# Patient Record
Sex: Female | Born: 1950
Health system: Southern US, Community
[De-identification: ages and names within clinical notes are randomized; demographics above are authoritative.]

## PROBLEM LIST (undated history)

## (undated) DIAGNOSIS — H409 Unspecified glaucoma: Secondary | ICD-10-CM

## (undated) DIAGNOSIS — K219 Gastro-esophageal reflux disease without esophagitis: Secondary | ICD-10-CM

## (undated) DIAGNOSIS — E039 Hypothyroidism, unspecified: Secondary | ICD-10-CM

## (undated) DIAGNOSIS — I10 Essential (primary) hypertension: Secondary | ICD-10-CM

## (undated) DIAGNOSIS — I208 Other forms of angina pectoris: Secondary | ICD-10-CM

## (undated) DIAGNOSIS — R0602 Shortness of breath: Secondary | ICD-10-CM

## (undated) DIAGNOSIS — F419 Anxiety disorder, unspecified: Secondary | ICD-10-CM

## (undated) DIAGNOSIS — R519 Headache, unspecified: Secondary | ICD-10-CM

## (undated) DIAGNOSIS — F329 Major depressive disorder, single episode, unspecified: Secondary | ICD-10-CM

## (undated) DIAGNOSIS — F32A Depression, unspecified: Secondary | ICD-10-CM

## (undated) DIAGNOSIS — D649 Anemia, unspecified: Secondary | ICD-10-CM

## (undated) DIAGNOSIS — I2089 Other forms of angina pectoris: Secondary | ICD-10-CM

## (undated) DIAGNOSIS — M1990 Unspecified osteoarthritis, unspecified site: Secondary | ICD-10-CM

## (undated) DIAGNOSIS — R51 Headache: Secondary | ICD-10-CM

## (undated) DIAGNOSIS — R011 Cardiac murmur, unspecified: Secondary | ICD-10-CM

## (undated) DIAGNOSIS — E785 Hyperlipidemia, unspecified: Secondary | ICD-10-CM

## (undated) HISTORY — PX: CHOLECYSTECTOMY: SHX55

## (undated) HISTORY — PX: ABDOMINAL HYSTERECTOMY: SHX81

## (undated) HISTORY — DX: Essential (primary) hypertension: I10

## (undated) HISTORY — DX: Anemia, unspecified: D64.9

## (undated) HISTORY — DX: Depression, unspecified: F32.A

## (undated) HISTORY — DX: Cardiac murmur, unspecified: R01.1

## (undated) HISTORY — DX: Unspecified glaucoma: H40.9

## (undated) HISTORY — PX: BREAST ENHANCEMENT SURGERY: SHX7

## (undated) HISTORY — DX: Hyperlipidemia, unspecified: E78.5

## (undated) HISTORY — DX: Gastro-esophageal reflux disease without esophagitis: K21.9

## (undated) HISTORY — PX: EYE SURGERY: SHX253

## (undated) HISTORY — PX: COSMETIC SURGERY: SHX468

## (undated) HISTORY — DX: Major depressive disorder, single episode, unspecified: F32.9

## (undated) HISTORY — PX: BREAST SURGERY: SHX581

## (undated) HISTORY — DX: Unspecified osteoarthritis, unspecified site: M19.90

## (undated) HISTORY — DX: Anxiety disorder, unspecified: F41.9

---

## 1998-08-05 ENCOUNTER — Other Ambulatory Visit: Admission: RE | Admit: 1998-08-05 | Discharge: 1998-08-05 | Payer: Self-pay | Admitting: Obstetrics & Gynecology

## 1999-10-01 ENCOUNTER — Other Ambulatory Visit: Admission: RE | Admit: 1999-10-01 | Discharge: 1999-10-01 | Payer: Self-pay | Admitting: Obstetrics & Gynecology

## 1999-12-07 ENCOUNTER — Other Ambulatory Visit: Admission: RE | Admit: 1999-12-07 | Discharge: 1999-12-07 | Payer: Self-pay | Admitting: Obstetrics & Gynecology

## 2000-01-04 ENCOUNTER — Other Ambulatory Visit: Admission: RE | Admit: 2000-01-04 | Discharge: 2000-01-04 | Payer: Self-pay | Admitting: Obstetrics & Gynecology

## 2000-08-11 ENCOUNTER — Other Ambulatory Visit: Admission: RE | Admit: 2000-08-11 | Discharge: 2000-08-11 | Payer: Self-pay | Admitting: Obstetrics & Gynecology

## 2000-11-14 ENCOUNTER — Other Ambulatory Visit: Admission: RE | Admit: 2000-11-14 | Discharge: 2000-11-14 | Payer: Self-pay | Admitting: Obstetrics & Gynecology

## 2001-05-25 ENCOUNTER — Other Ambulatory Visit: Admission: RE | Admit: 2001-05-25 | Discharge: 2001-05-25 | Payer: Self-pay | Admitting: Obstetrics & Gynecology

## 2001-12-12 ENCOUNTER — Other Ambulatory Visit: Admission: RE | Admit: 2001-12-12 | Discharge: 2001-12-12 | Payer: Self-pay | Admitting: Obstetrics & Gynecology

## 2002-06-18 ENCOUNTER — Other Ambulatory Visit: Admission: RE | Admit: 2002-06-18 | Discharge: 2002-06-18 | Payer: Self-pay | Admitting: Obstetrics & Gynecology

## 2003-05-15 ENCOUNTER — Other Ambulatory Visit: Admission: RE | Admit: 2003-05-15 | Discharge: 2003-05-15 | Payer: Self-pay | Admitting: Obstetrics & Gynecology

## 2004-01-14 ENCOUNTER — Other Ambulatory Visit: Admission: RE | Admit: 2004-01-14 | Discharge: 2004-01-14 | Payer: Self-pay | Admitting: Obstetrics & Gynecology

## 2004-06-30 ENCOUNTER — Other Ambulatory Visit: Admission: RE | Admit: 2004-06-30 | Discharge: 2004-06-30 | Payer: Self-pay | Admitting: Obstetrics & Gynecology

## 2005-01-08 ENCOUNTER — Other Ambulatory Visit: Admission: RE | Admit: 2005-01-08 | Discharge: 2005-01-08 | Payer: Self-pay | Admitting: Obstetrics & Gynecology

## 2006-02-01 ENCOUNTER — Ambulatory Visit (HOSPITAL_COMMUNITY): Admission: RE | Admit: 2006-02-01 | Discharge: 2006-02-01 | Payer: Self-pay | Admitting: Obstetrics & Gynecology

## 2011-01-26 HISTORY — PX: DILATION AND CURETTAGE OF UTERUS: SHX78

## 2011-04-27 ENCOUNTER — Telehealth: Payer: Self-pay

## 2011-04-27 NOTE — Telephone Encounter (Signed)
Done and sent.  [place] 

## 2011-04-27 NOTE — Telephone Encounter (Signed)
Patient lost her rx for shingles vaccine that Dr. Cleta Alberts gave her last year. She would like it sent to [facility].

## 2011-04-28 NOTE — Telephone Encounter (Signed)
lmom that rx was sent to pharmacy 

## 2011-07-18 ENCOUNTER — Other Ambulatory Visit: Payer: Self-pay | Admitting: Emergency Medicine

## 2011-07-18 ENCOUNTER — Ambulatory Visit (INDEPENDENT_AMBULATORY_CARE_PROVIDER_SITE_OTHER): Payer: 59 | Admitting: Emergency Medicine

## 2011-07-18 VITALS — BP 131/76 | HR 61 | Temp 98.0°F | Resp 16 | Ht 62.5 in | Wt 119.0 lb

## 2011-07-18 DIAGNOSIS — I10 Essential (primary) hypertension: Secondary | ICD-10-CM

## 2011-07-18 DIAGNOSIS — E789 Disorder of lipoprotein metabolism, unspecified: Secondary | ICD-10-CM

## 2011-07-18 DIAGNOSIS — Z139 Encounter for screening, unspecified: Secondary | ICD-10-CM

## 2011-07-18 DIAGNOSIS — E039 Hypothyroidism, unspecified: Secondary | ICD-10-CM

## 2011-07-18 LAB — LIPID PANEL
HDL: 75 mg/dL (ref >39)
LDL Cholesterol: 154 mg/dL — ABNORMAL HIGH (ref 0–99)
Total CHOL/HDL Ratio: 3.4 Ratio

## 2011-07-18 LAB — CBC WITH DIFFERENTIAL/PLATELET
Basophils Absolute: 0 10*3/uL (ref 0.0–0.1)
Eosinophils Relative: 2 % (ref 0–5)
Lymphocytes Relative: 29 % (ref 12–46)
MCV: 91.3 fL (ref 78.0–100.0)
Neutro Abs: 3.4 10*3/uL (ref 1.7–7.7)
Neutrophils Relative %: 58 % (ref 43–77)
Platelets: 334 10*3/uL (ref 150–400)
RDW: 14.1 % (ref 11.5–15.5)
WBC: 5.9 10*3/uL (ref 4.0–10.5)

## 2011-07-18 LAB — COMPREHENSIVE METABOLIC PANEL
ALT: 13 U/L (ref 0–35)
AST: 15 U/L (ref 0–37)
Calcium: 10.1 mg/dL (ref 8.4–10.5)
Chloride: 106 mEq/L (ref 96–112)
Creat: 0.76 mg/dL (ref 0.50–1.10)
Sodium: 141 mEq/L (ref 135–145)

## 2011-07-18 NOTE — Progress Notes (Signed)
  Subjective:    Patient ID: Kristy Arias, female    DOB: [birth year], 61 y.o.   MRN: [id number]  HPI brief discussion with patient regarding upcoming physical exam.    Review of Systems     Objective:   Physical Exam and physical exam for        Assessment & Plan:  Discussed what we will do an upcoming physical exam. Labs were done that we will use for her physical

## 2011-07-19 LAB — VITAMIN D 25 HYDROXY (VIT D DEFICIENCY, FRACTURES): Vit D, 25-Hydroxy: 33 ng/mL (ref 30–89)

## 2011-08-18 ENCOUNTER — Encounter: Payer: Self-pay | Admitting: Emergency Medicine

## 2011-09-11 ENCOUNTER — Other Ambulatory Visit (INDEPENDENT_AMBULATORY_CARE_PROVIDER_SITE_OTHER): Payer: 59

## 2011-09-11 VITALS — BP 112/80 | HR 65 | Temp 97.5°F | Resp 16 | Ht 62.5 in | Wt 122.0 lb

## 2011-09-11 DIAGNOSIS — Z79899 Other long term (current) drug therapy: Secondary | ICD-10-CM

## 2011-09-11 DIAGNOSIS — E039 Hypothyroidism, unspecified: Secondary | ICD-10-CM

## 2011-09-11 DIAGNOSIS — E785 Hyperlipidemia, unspecified: Secondary | ICD-10-CM

## 2011-09-11 LAB — COMPREHENSIVE METABOLIC PANEL
ALT: 14 U/L (ref 0–35)
AST: 17 U/L (ref 0–37)
Calcium: 9.5 mg/dL (ref 8.4–10.5)
Chloride: 104 mEq/L (ref 96–112)
Creat: 1.01 mg/dL (ref 0.50–1.10)

## 2011-09-11 LAB — POCT CBC
Granulocyte percent: 55 %G (ref 37–80)
MCH, POC: 29.8 pg (ref 27–31.2)
MID (cbc): 0.5 (ref 0–0.9)
MPV: 8.9 fL (ref 0–99.8)
POC Granulocyte: 3.7 (ref 2–6.9)
POC MID %: 6.8 %M (ref 0–12)
Platelet Count, POC: 357 10*3/uL (ref 142–424)
RBC: 4.63 M/uL (ref 4.04–5.48)
WBC: 6.7 10*3/uL (ref 4.6–10.2)

## 2011-09-11 LAB — LIPID PANEL
HDL: 53 mg/dL (ref >39)
LDL Cholesterol: 111 mg/dL — ABNORMAL HIGH (ref 0–99)
Total CHOL/HDL Ratio: 3.8 Ratio

## 2011-09-11 LAB — T4, FREE: Free T4: 1.36 ng/dL (ref 0.80–1.80)

## 2011-09-12 ENCOUNTER — Encounter: Payer: Self-pay | Admitting: *Deleted

## 2011-09-21 ENCOUNTER — Other Ambulatory Visit: Payer: Self-pay

## 2011-09-21 MED ORDER — LEVOTHYROXINE SODIUM 50 MCG PO TABS: 50.0000 ug | ORAL_TABLET | Freq: Every day | ORAL | Status: DC

## 2011-11-02 ENCOUNTER — Encounter: Payer: Self-pay | Admitting: Emergency Medicine

## 2011-11-02 ENCOUNTER — Telehealth: Payer: Self-pay

## 2011-11-02 ENCOUNTER — Ambulatory Visit: Payer: 59

## 2011-11-02 ENCOUNTER — Ambulatory Visit: Payer: 59 | Admitting: Emergency Medicine

## 2011-11-02 VITALS — BP 128/78 | HR 64 | Temp 97.9°F | Resp 18 | Ht 62.5 in | Wt 122.0 lb

## 2011-11-02 DIAGNOSIS — I773 Arterial fibromuscular dysplasia: Secondary | ICD-10-CM | POA: Insufficient documentation

## 2011-11-02 DIAGNOSIS — F329 Major depressive disorder, single episode, unspecified: Secondary | ICD-10-CM | POA: Insufficient documentation

## 2011-11-02 DIAGNOSIS — J309 Allergic rhinitis, unspecified: Secondary | ICD-10-CM

## 2011-11-02 DIAGNOSIS — E785 Hyperlipidemia, unspecified: Secondary | ICD-10-CM | POA: Insufficient documentation

## 2011-11-02 DIAGNOSIS — M1990 Unspecified osteoarthritis, unspecified site: Secondary | ICD-10-CM

## 2011-11-02 DIAGNOSIS — I10 Essential (primary) hypertension: Secondary | ICD-10-CM | POA: Insufficient documentation

## 2011-11-02 DIAGNOSIS — F32A Depression, unspecified: Secondary | ICD-10-CM | POA: Insufficient documentation

## 2011-11-02 DIAGNOSIS — Z0000 Encounter for general adult medical examination without abnormal findings: Secondary | ICD-10-CM

## 2011-11-02 LAB — POCT URINALYSIS DIPSTICK
Glucose, UA: NEGATIVE
Nitrite, UA: NEGATIVE
Urobilinogen, UA: 0.2

## 2011-11-02 LAB — POCT UA - MICROSCOPIC ONLY
Bacteria, U Microscopic: NEGATIVE
Casts, Ur, LPF, POC: NEGATIVE
Yeast, UA: NEGATIVE

## 2011-11-02 MED ORDER — BUTALBITAL-ASA-CAFF-CODEINE 50-325-40-30 MG PO CAPS: 1.0000 | ORAL_CAPSULE | ORAL | Status: DC | PRN

## 2011-11-02 MED ORDER — LEVOTHYROXINE SODIUM 50 MCG PO TABS: 50.0000 ug | ORAL_TABLET | Freq: Every day | ORAL | Status: DC

## 2011-11-02 MED ORDER — DULOXETINE HCL 20 MG PO CPEP: 20.0000 mg | ORAL_CAPSULE | Freq: Every day | ORAL | Status: DC

## 2011-11-02 MED ORDER — ALPRAZOLAM 0.5 MG PO TABS: 0.5000 mg | ORAL_TABLET | Freq: Every evening | ORAL | Status: DC | PRN

## 2011-11-02 MED ORDER — NORTRIPTYLINE HCL 25 MG PO CAPS: 25.0000 mg | ORAL_CAPSULE | Freq: Every day | ORAL | Status: DC

## 2011-11-02 MED ORDER — ROSUVASTATIN CALCIUM 5 MG PO TABS: 5.0000 mg | ORAL_TABLET | Freq: Every day | ORAL | Status: DC

## 2011-11-02 MED ORDER — DICLOFENAC SODIUM 75 MG PO TBEC: 75.0000 mg | DELAYED_RELEASE_TABLET | Freq: Two times a day (BID) | ORAL | Status: DC

## 2011-11-02 MED ORDER — LISINOPRIL 10 MG PO TABS: 10.0000 mg | ORAL_TABLET | Freq: Every day | ORAL | Status: DC

## 2011-11-02 MED ORDER — MONTELUKAST SODIUM 10 MG PO TABS: 10.0000 mg | ORAL_TABLET | Freq: Every day | ORAL | Status: DC

## 2011-11-02 NOTE — Progress Notes (Signed)
  Subjective:    Patient ID: Kristy Arias, female    DOB: 1950/07/22, 61 y.o.   MRN: [id number]  HPI patient here for her yearly physical.    Review of Systems  HENT: Positive for sneezing, postnasal drip and sinus pressure.   Eyes: Positive for redness and itching.  Cardiovascular: Positive for leg swelling.  Genitourinary: Positive for urgency and frequency.  Musculoskeletal: Positive for back pain.  Neurological: Positive for headaches.  Psychiatric/Behavioral: Positive for decreased concentration.       Objective:   Physical Exam HEENT exam reveals a minimal amount of scaling at the entrance to both external auditory canals. There are loud carotid bruits bilaterally. The chest is clear to auscultation and cardiac exam reveals a regular rate without murmurs rubs or gallops the abdomen is flat liver and spleen are not enlarged. Examination of the breast revealed no masses there are bilateral implants present. Extremity exam reveals pulses 2+ and symmetrical without edema. There is mild discomfort over left lateral malleolus. Examination of the knees reveals inability to get full extension to either knee there is pain with full flexion and extension. Results for orders placed in visit on 11/02/11  POCT URINALYSIS DIPSTICK      Component Value Range   Color, UA yellow     Clarity, UA clear     Glucose, UA neg     Bilirubin, UA neg     Ketones, UA neg     Spec Grav, UA 1.015     Blood, UA large     pH, UA 5.5     Protein, UA neg     Urobilinogen, UA 0.2     Nitrite, UA neg     Leukocytes, UA Negative    [facility] reading (PRIMARY) by  Dr.Umeka Wrench 3 views of the ankle are normal. The views of both knees show mild narrowing of the joint space but otherwise unremarkable.        Assessment & Plan:  Patient is depressed. She has taken Wellbutrin in the past and Prozac in the past we'll switch to Cymbalta and see how she does. She is a significant amount of problems with arthritis which she  treats with nonsteroidals. Other option is to  consider a different type of exercise such as exercise bike or swimming. I told her to try and take a break from her nonsteroidal at times. She is going to try Singulair and see if this helps before going back to see the allergist. She was agreeable today to see a therapist.

## 2011-11-02 NOTE — Telephone Encounter (Signed)
PT STATES SHE HAVE AN APPT THIS AFTERNOON WITH DR DAUB AND WANT TO MAKE SURE ALL HER LABS AND THE DOPPLER SHE HAD DONE WERE BACK PLEASE CALL [phone]

## 2011-11-02 NOTE — Telephone Encounter (Signed)
Spoke to her, advised labs available but will have to call and get the doppler, called [facility], they are sending to [place]

## 2011-12-29 ENCOUNTER — Telehealth: Payer: Self-pay

## 2011-12-29 NOTE — Telephone Encounter (Signed)
Last office notes, labs, radiology reports, and EKG sent thru Epic. Stress test faxed with confirmation.

## 2011-12-29 NOTE — Telephone Encounter (Signed)
[facility] STATES THEY NEED RECENT RECORDS,EKG,LABS,XRAYS DONE ON PT. AND ALSO PT HAD A STRESS TEST IN 09. WOULD LIKE TO HAVE THAT ALSO. PLEASE FAX TO [phone] AND THE PHONE NUMBER IS [phone]  PT IS WAITING

## 2011-12-30 ENCOUNTER — Other Ambulatory Visit: Payer: Self-pay | Admitting: Obstetrics & Gynecology

## 2012-01-26 ENCOUNTER — Other Ambulatory Visit: Payer: Self-pay | Admitting: Radiology

## 2012-01-26 MED ORDER — LISINOPRIL 10 MG PO TABS: 10.0000 mg | ORAL_TABLET | Freq: Every day | ORAL | Status: DC

## 2012-01-26 NOTE — Telephone Encounter (Signed)
Patient can get Lisinopril for $10, if she gets 100 quantity, Lanora Manis has authorized this, have sent to pharmacy.

## 2012-03-23 ENCOUNTER — Ambulatory Visit: Payer: 59

## 2012-03-23 ENCOUNTER — Ambulatory Visit (INDEPENDENT_AMBULATORY_CARE_PROVIDER_SITE_OTHER): Payer: 59 | Admitting: Emergency Medicine

## 2012-03-23 VITALS — BP 110/74 | HR 73 | Temp 97.6°F | Resp 16 | Ht 62.2 in | Wt 122.0 lb

## 2012-03-23 DIAGNOSIS — R198 Other specified symptoms and signs involving the digestive system and abdomen: Secondary | ICD-10-CM

## 2012-03-23 DIAGNOSIS — R109 Unspecified abdominal pain: Secondary | ICD-10-CM

## 2012-03-23 DIAGNOSIS — K8020 Calculus of gallbladder without cholecystitis without obstruction: Secondary | ICD-10-CM

## 2012-03-23 DIAGNOSIS — R197 Diarrhea, unspecified: Secondary | ICD-10-CM

## 2012-03-23 LAB — COMPREHENSIVE METABOLIC PANEL
Alkaline Phosphatase: 59 U/L (ref 39–117)
Glucose, Bld: 94 mg/dL (ref 70–99)
Sodium: 138 mEq/L (ref 135–145)
Total Bilirubin: 0.4 mg/dL (ref 0.3–1.2)
Total Protein: 6.3 g/dL (ref 6.0–8.3)

## 2012-03-23 LAB — POCT CBC
Granulocyte percent: 62.6 %G (ref 37–80)
HCT, POC: 37.9 % (ref 37.7–47.9)
Lymph, poc: 2.6 (ref 0.6–3.4)
MCH, POC: 29.4 pg (ref 27–31.2)
MCHC: 31.4 g/dL — AB (ref 31.8–35.4)
MCV: 93.5 fL (ref 80–97)
MID (cbc): 0.8 (ref 0–0.9)
POC LYMPH PERCENT: 28.4 %L (ref 10–50)
Platelet Count, POC: 333 10*3/uL (ref 142–424)
RDW, POC: 14.6 %

## 2012-03-23 MED ORDER — DICYCLOMINE HCL 20 MG PO TABS: ORAL_TABLET | ORAL | Status: DC

## 2012-03-23 NOTE — Patient Instructions (Addendum)
Lactaid- OTC

## 2012-03-23 NOTE — Progress Notes (Signed)
  Subjective:    Patient ID: [name], female    DOB: 1950/02/08, 62 y.o.   MRN: [id number]  HPI Pt presents to clinic today with complaints of diarrhea for at least about 2 months sporadically. She states she has had about 3 episodes in January 2014 and February 2014 that were "explosive"- she doesn't make it to the bathroom. This always happens at night- there is no cramping just a lot of gurgling before the episode. Pt uses Lomotil if she has 2-3 episodes of diarrhea in one night. She states that popcorn really bothers her stomach- she's noticed that nuts can give her a problem to.  Last summer Pt states she tried an herbal/vitamin treatment for constipation which seemed to help. She also tried a probiotic last summer. She stopped these remedies in the fall b/c she began taking Airborne and she did not want to mix all these drugs   Review of Systems     Objective:   Physical Exam H. EENT exam is unremarkable. Neck supple. Chest clear to auscultation and percussion. Heart regular rate no murmurs. Abdomen soft liver and spleen not large there no areas of tenderness.  Results for orders placed in visit on 03/23/12  POCT CBC      Result Value Range   WBC 9.3  4.6 - 10.2 K/uL   Lymph, poc 2.6  0.6 - 3.4   POC LYMPH PERCENT 28.4  10 - 50 %L   MID (cbc) 0.8  0 - 0.9   POC MID % 9.0  0 - 12 %M   POC Granulocyte 5.8  2 - 6.9   Granulocyte percent 62.6  37 - 80 %G   RBC 4.05  4.04 - 5.48 M/uL   Hemoglobin 11.9 (*) 12.2 - 16.2 g/dL   HCT, POC 56.2  13.0 - 47.9 %   MCV 93.5  80 - 97 fL   MCH, POC 29.4  27 - 31.2 pg   MCHC 31.4 (*) 31.8 - 35.4 g/dL   RDW, POC 86.5     Platelet Count, POC 333  142 - 424 K/uL   MPV 8.4  0 - 99.8 fL   [facility] reading (PRIMARY) by  Dr Cleta Alberts shows a half by half centimeter calcific density in the left upper abdomen. I suspect this is in the kidney.      assessment and plan   We'll collect stool for ova and parasite, Giardia, stool culture, and C. Difficile.  She's given a trial of Bentyl 20 mg 3 times a day. She did have a calcific density present on her abdominal series so we'll go ahead and do an ultrasound to look at her liver, gallbladder, and left kidney.

## 2012-03-24 LAB — GLIA (IGA/G) + TTG IGA: Tissue Transglutaminase Ab, IgA: 3.8 U/mL (ref <20)

## 2012-03-28 LAB — GIARDIA/CRYPTOSPORIDIUM (EIA)
Cryptosporidium Screen (EIA): NEGATIVE
Giardia Screen (EIA): NEGATIVE

## 2012-03-29 ENCOUNTER — Ambulatory Visit
Admission: RE | Admit: 2012-03-29 | Discharge: 2012-03-29 | Disposition: A | Discharge status: Home / Self Care | Payer: 59 | Source: Ambulatory Visit | Attending: Emergency Medicine | Admitting: Emergency Medicine

## 2012-03-29 DIAGNOSIS — R109 Unspecified abdominal pain: Secondary | ICD-10-CM

## 2012-03-29 LAB — CLOSTRIDIUM DIFFICILE EIA: CDIFTX: NEGATIVE

## 2012-03-31 LAB — STOOL CULTURE

## 2012-04-03 NOTE — Addendum Note (Signed)
Addended by: Cydney Ok on: 04/03/2012 10:12 AM   Modules accepted: Orders

## 2012-04-05 ENCOUNTER — Telehealth: Payer: Self-pay

## 2012-04-05 ENCOUNTER — Emergency Department (HOSPITAL_COMMUNITY)
Admission: EM | Admit: 2012-04-05 | Discharge: 2012-04-05 | Disposition: A | Discharge status: Home / Self Care | Payer: 59 | Attending: Emergency Medicine | Admitting: Emergency Medicine

## 2012-04-05 ENCOUNTER — Ambulatory Visit (INDEPENDENT_AMBULATORY_CARE_PROVIDER_SITE_OTHER): Payer: 59 | Admitting: Emergency Medicine

## 2012-04-05 ENCOUNTER — Encounter (HOSPITAL_COMMUNITY): Payer: Self-pay | Admitting: Unknown Physician Specialty

## 2012-04-05 ENCOUNTER — Emergency Department (HOSPITAL_COMMUNITY): Payer: 59

## 2012-04-05 VITALS — BP 140/80 | HR 76 | Temp 98.0°F | Resp 16 | Ht 62.0 in | Wt 119.4 lb

## 2012-04-05 DIAGNOSIS — Z8669 Personal history of other diseases of the nervous system and sense organs: Secondary | ICD-10-CM | POA: Insufficient documentation

## 2012-04-05 DIAGNOSIS — R079 Chest pain, unspecified: Secondary | ICD-10-CM

## 2012-04-05 DIAGNOSIS — F3289 Other specified depressive episodes: Secondary | ICD-10-CM | POA: Insufficient documentation

## 2012-04-05 DIAGNOSIS — I10 Essential (primary) hypertension: Secondary | ICD-10-CM | POA: Insufficient documentation

## 2012-04-05 DIAGNOSIS — R0789 Other chest pain: Secondary | ICD-10-CM

## 2012-04-05 DIAGNOSIS — Z7982 Long term (current) use of aspirin: Secondary | ICD-10-CM | POA: Insufficient documentation

## 2012-04-05 DIAGNOSIS — E785 Hyperlipidemia, unspecified: Secondary | ICD-10-CM | POA: Insufficient documentation

## 2012-04-05 DIAGNOSIS — F329 Major depressive disorder, single episode, unspecified: Secondary | ICD-10-CM | POA: Insufficient documentation

## 2012-04-05 DIAGNOSIS — R9431 Abnormal electrocardiogram [ECG] [EKG]: Secondary | ICD-10-CM

## 2012-04-05 DIAGNOSIS — R011 Cardiac murmur, unspecified: Secondary | ICD-10-CM | POA: Insufficient documentation

## 2012-04-05 DIAGNOSIS — F411 Generalized anxiety disorder: Secondary | ICD-10-CM | POA: Insufficient documentation

## 2012-04-05 DIAGNOSIS — Z79899 Other long term (current) drug therapy: Secondary | ICD-10-CM | POA: Insufficient documentation

## 2012-04-05 LAB — CBC
HCT: 35 % — ABNORMAL LOW (ref 36.0–46.0)
Hemoglobin: 11.6 g/dL — ABNORMAL LOW (ref 12.0–15.0)
MCH: 30.1 pg (ref 26.0–34.0)
MCV: 90.7 fL (ref 78.0–100.0)
RBC: 3.86 MIL/uL — ABNORMAL LOW (ref 3.87–5.11)

## 2012-04-05 LAB — URINALYSIS, ROUTINE W REFLEX MICROSCOPIC
Bilirubin Urine: NEGATIVE
Glucose, UA: NEGATIVE mg/dL
Ketones, ur: NEGATIVE mg/dL
pH: 6.5 (ref 5.0–8.0)

## 2012-04-05 LAB — URINE MICROSCOPIC-ADD ON

## 2012-04-05 LAB — POCT I-STAT TROPONIN I

## 2012-04-05 LAB — COMPREHENSIVE METABOLIC PANEL
AST: 11 U/L (ref 0–37)
CO2: 27 mEq/L (ref 19–32)
Chloride: 103 mEq/L (ref 96–112)
Creatinine, Ser: 0.92 mg/dL (ref 0.50–1.10)
GFR calc non Af Amer: 67 mL/min — ABNORMAL LOW (ref >90)
Total Bilirubin: 0.2 mg/dL — ABNORMAL LOW (ref 0.3–1.2)

## 2012-04-05 MED ORDER — ASPIRIN 81 MG PO CHEW: 324.0000 mg | CHEWABLE_TABLET | Freq: Once | ORAL | Status: DC

## 2012-04-05 NOTE — ED Notes (Signed)
Pt d/c'd from IV, monitor, continuous pulse oximetry and blood pressure cuff; pt getting dressed at this time to be discharged home; family at bedside

## 2012-04-05 NOTE — ED Notes (Signed)
Pt arrived via [facility] already in gown, pt placed on monitor, continuous pulse oximetry, blood pressure cuff and oxygen [place] (2L); vitals and EKG being performed

## 2012-04-05 NOTE — ED Provider Notes (Signed)
Date: 04/05/2012  Rate: 67  Rhythm: normal sinus rhythm  QRS Axis: normal  Intervals: normal QRS:  Poor R wave progression in precordial leads suggest old anterior myocardial infarction.  ST/T Wave abnormalities: normal  Conduction Disutrbances:none  Narrative Interpretation: Abnormal EKG  Old EKG Reviewed: none available    Carleene Cooper III, MD 04/07/12 1100

## 2012-04-05 NOTE — Telephone Encounter (Signed)
Pt would like to speak with Dr.Daub Regarding her referall to [place] [facility]. Pt states that she called on Monday regarding this but I do not see a phone message on file for this pt.  Best# [phone]

## 2012-04-05 NOTE — ED Provider Notes (Signed)
History     CSN: [id number]  Arrival date & time 04/05/12  1435   First MD Initiated Contact with Patient 04/05/12 1458      Chief Complaint  Patient presents with  . Chest Pain    (Consider location/radiation/quality/duration/timing/severity/associated sxs/prior treatment) HPI  Marguerite ROLENA KNUTSON is a 62 y.o. female c/o substernal chest pain radiating to the right arm. She describes the pain as aching rated at 7/10 at worst; she states that the inner right arm felt funny in the afternoon. Patient was sitting at a meeting yesterday morning with the onset of the pain the pain was constant until yesterday evening. She is pain-free now and the pain was constant until last night. She states that the pain is worse when sitting in alleviated by standing. She took Bentyl with little relief and took Fioricet later in the night with significant relief. She is taking estrogen and replacement therapy she is on her fifth day of a 12 day course.  Patient does endorse a reduced by mouth intake over the last day, she had an episode of nausea in the afternoon yesterday that resolved. She also endorses a shortness of breath that she's had for several years it is nonexertional, but it only occurs when she is climbing steps. Denies SOB, N/V, diaphoresis, cough, fever, back pain, syncope, prior episodes. Denies recent travel, leg swelling, hemoptysis.  Pt had a baby aspirin 11 PM last night.   RF: age over 62, hyperlipidemia and hypertension. Cath:  ?  Last Stress test: several years ago Cardiologost: unknown cardiologist evaluation there was a stress test several years ago which he believes was normal. PCP: [facility] Dr. Cleta Alberts  Past Medical History  Diagnosis Date  . Anxiety   . Depression   . Glaucoma   . Hyperlipidemia   . Heart murmur   . Hypertension     Past Surgical History  Procedure Laterality Date  . Breast enhancement surgery    . Breast surgery    . Cosmetic surgery    . Dilation  and curettage of uterus  2013    Family History  Problem Relation Age of Onset  . Hypertension Father   . Hypertension Maternal Grandmother   . Heart disease Maternal Grandmother   . Diabetes Maternal Grandfather   . Osteoporosis Paternal Grandmother   . Heart disease Paternal Grandfather     History  Substance Use Topics  . Smoking status: Never Smoker   . Smokeless tobacco: Never Used  . Alcohol Use: Not on file    OB History   Grav Para Term Preterm Abortions TAB SAB Ect Mult Living                  Review of Systems  Constitutional: Negative for fever.  Respiratory: Negative for shortness of breath.   Cardiovascular: Positive for chest pain. Negative for palpitations and leg swelling.  Gastrointestinal: Negative for nausea, vomiting, abdominal pain and diarrhea.  All other systems reviewed and are negative.    Allergies  Review of patient's allergies indicates no known allergies.  Home Medications   Current Outpatient Rx  Name  Route  Sig  Dispense  Refill  . ALPRAZolam (XANAX) 0.5 MG tablet   Oral   Take 1 tablet (0.5 mg total) by mouth at bedtime as needed.   30 tablet   5   . aspirin 81 MG tablet   Oral   Take 81 mg by mouth daily.         Marland Kitchen  butalbital-aspirin-caffeine-codeine (FIORINAL WITH CODEINE) 50-325-40-30 MG capsule   Oral   Take 1 capsule by mouth every 4 (four) hours as needed.   30 capsule   5   . calcium carbonate (TUMS - DOSED IN MG ELEMENTAL CALCIUM) 500 MG chewable tablet   Oral   Chew 1 tablet by mouth daily.         . cetirizine (ZYRTEC) 10 MG tablet   Oral   Take 10 mg by mouth daily.         . chlorpheniramine (CHLOR-TRIMETON) 4 MG tablet   Oral   Take 4 mg by mouth 2 (two) times daily as needed for allergies.         . cholecalciferol (VITAMIN D) 1000 UNITS tablet   Oral   Take 2,000 Units by mouth daily.         . diclofenac (VOLTAREN) 75 MG EC tablet   Oral   Take 1 tablet (75 mg total) by mouth 2 (two)  times daily.   60 tablet   5   . dicyclomine (BENTYL) 20 MG tablet      Take 1 tablet 3 times a day   90 tablet   5   . DULoxetine (CYMBALTA) 20 MG capsule   Oral   Take 1 capsule (20 mg total) by mouth daily.   30 capsule   11   . estradiol (ESTRACE) 1 MG tablet   Oral   Take 1 mg by mouth daily.         Marland Kitchen levothyroxine (SYNTHROID, LEVOTHROID) 50 MCG tablet   Oral   Take 1 tablet (50 mcg total) by mouth daily.   30 tablet   5   . lisinopril (PRINIVIL,ZESTRIL) 10 MG tablet   Oral   Take 1 tablet (10 mg total) by mouth daily.   100 tablet   2     Okay for 100 tablets for patient to save on her co ...   . nortriptyline (PAMELOR) 25 MG capsule   Oral   Take 1 capsule (25 mg total) by mouth at bedtime.   30 capsule   11   . progesterone (PROMETRIUM) 100 MG capsule   Oral   Take 200 mg by mouth at bedtime.          . rosuvastatin (CRESTOR) 5 MG tablet   Oral   Take 1 tablet (5 mg total) by mouth daily.   30 tablet   11   . timolol (BETIMOL) 0.25 % ophthalmic solution      1-2 drops 2 (two) times daily.         . Travoprost, BAK Free, (TRAVATAN) 0.004 % SOLN ophthalmic solution      1 drop at bedtime.           BP 170/89  Pulse 70  Temp(Src) 98.7 F (37.1 C) (Oral)  Resp 17  Ht 5' 2.5" (1.588 m)  Wt 120 lb (54.432 kg)  BMI 21.59 kg/m2  SpO2 100%  Physical Exam  Nursing note and vitals reviewed. Constitutional: She is oriented to person, place, and time. She appears well-developed and well-nourished. No distress.  HENT:  Head: Normocephalic and atraumatic.  Mouth/Throat: Oropharynx is clear and moist.  Eyes: Conjunctivae and EOM are normal. Pupils are equal, round, and reactive to light.  Neck: Normal range of motion. No JVD present.  Cardiovascular: Normal rate and intact distal pulses.  Exam reveals gallop.   S4 gallop with midsystolic click  Pulmonary/Chest: Effort normal and  breath sounds normal. No stridor. No respiratory distress.  She has no wheezes. She has no rales. She exhibits no tenderness.  Abdominal: Soft. Bowel sounds are normal. She exhibits no distension and no mass. There is tenderness. There is no rebound and no guarding.  Epigastric tenderness to deep palpation with no rebound or guarding  Musculoskeletal: Normal range of motion. She exhibits no edema.  Homans sign negative bilaterally, there is no calf asymmetry or superficial collaterals, there is no erythema or pitting edema.  Neurological: She is alert and oriented to person, place, and time.  Skin: Skin is warm.  Psychiatric: She has a normal mood and affect.    ED Course  Procedures (including critical care time)  Labs Reviewed  CBC - Abnormal; Notable for the following:    WBC 10.7 (*)    RBC 3.86 (*)    Hemoglobin 11.6 (*)    HCT 35.0 (*)    All other components within normal limits  URINALYSIS, ROUTINE W REFLEX MICROSCOPIC - Abnormal; Notable for the following:    APPearance CLOUDY (*)    Hgb urine dipstick LARGE (*)    Leukocytes, UA TRACE (*)    All other components within normal limits  COMPREHENSIVE METABOLIC PANEL - Abnormal; Notable for the following:    Albumin 3.1 (*)    Total Bilirubin 0.2 (*)    GFR calc non Af Amer 67 (*)    GFR calc Af Amer 77 (*)    All other components within normal limits  URINE MICROSCOPIC-ADD ON - Abnormal; Notable for the following:    Squamous Epithelial / LPF FEW (*)    Casts HYALINE CASTS (*)    All other components within normal limits  POCT I-STAT TROPONIN I   Dg Chest 2 View  04/05/2012  *RADIOLOGY REPORT*  Clinical Data: Chest pain  CHEST - 2 VIEW  Comparison: None.  Findings: The lungs are well-aerated and free from pulmonary edema, focal airspace consolidation or pulmonary nodule.  Cardiac and mediastinal contours are within normal limits.  No pneumothorax, or pleural effusion. No acute osseous findings. bilateral breast augmentation prostheses.  IMPRESSION:  No acute cardiopulmonary  disease.   Original Report Authenticated By: Malachy Moan, M.D.       Date: 04/05/2012  Rate: 67  Rhythm: normal sinus rhythm  QRS Axis: normal  Intervals: normal  ST/T Wave abnormalities: nonspecific T wave changes  Conduction Disutrbances:none  Narrative Interpretation: new T-wave inversions in aVL and V2  Old EKG Reviewed: changes noted when compared to October of 2013 EKG   1. Atypical chest pain       MDM   Patient with resolveatypical chest pain. EKG is non-ischemic. Troponin is negative.blood work is otherwise unremarkable.  Doubt PE, I have discussed case with attending who agrees.  Cardiology consult from Dr. Rudell Cobb appreciated:he will expedite a appointment for her as an outpatient.  Pt verbalized understanding and agrees with care plan. Outpatient follow-up and return precautions given.       Wynetta Emery, PA-C 04/06/12 [phone]

## 2012-04-05 NOTE — Telephone Encounter (Signed)
Called patient advised Dr Cleta Alberts will call but not until later. She states she called Sunday was told her results. I have reviewed in detail with her today. She still has questions for you. She still c/o pain epigastric. She is upset. We will call her back this afternoon. Kristy Arias

## 2012-04-05 NOTE — ED Notes (Signed)
Warm blanket given

## 2012-04-05 NOTE — ED Notes (Signed)
Patient states she has been increasing more short of breath walking up and down stairs over the past 2 years.

## 2012-04-05 NOTE — ED Notes (Signed)
Pt still out of the department at this time 

## 2012-04-05 NOTE — ED Notes (Signed)
Pt has returned from being out of the department; placed back on monitor, continuous pulse oximetry and blood pressure cuff; pt also used the bathroom; family at bedside

## 2012-04-05 NOTE — Progress Notes (Signed)
  Subjective:    Patient ID: Kristy Arias, female    DOB: 01-19-1951, 62 y.o.   MRN: [id number]  HPI diarrhea, now has pain. In meeting and sitting and started to have epigastric pain,  Dull and  achy, not sharp, not constant. Radiates under left breast. Monday night started to have some mild pain down left arm. Level pain on scale was a 7. Lasted all day yesterday. Taking bentyl. Less pain today. Mild nausea.     Review of Systems     Objective:   Physical Exam neck is supple. Chest is clear to auscultation and percussion. Heart reveals a loud S4 gallop with a mitral click no murmur the abdomen is flat but there is significant mid epigastric tenderness .   EKG T. inversion aVL and V2 change from     Assessment & Plan:  Patient with a known solitary gallstone presents with an episode yesterday of significant substernal chest pain which persisted through the day and was associated with nausea and left arm discomfort. She does have EKG changes of T. inversion in aVL and V2 which is different from her previous cardiogram. We need to rule out cardiac disease. If her cardiac exal is normal need to consider cholecystectomy. Patient placed on monitor her to be inserted to

## 2012-04-05 NOTE — ED Notes (Signed)
Patient arrived via [facility] with chest pain described as pressure, tightness that started yesterday morning. Patient is in need of a cholecystectomy but needs to be cleared today. She is currently chest pain free.

## 2012-04-05 NOTE — Telephone Encounter (Signed)
iscuss case with the patient. She's going to return to clinic and have EKG done and I'll discuss with the surgeon the possibility of a cholecystectomy .

## 2012-04-06 ENCOUNTER — Telehealth: Payer: Self-pay | Admitting: Radiology

## 2012-04-06 NOTE — Telephone Encounter (Signed)
Dr Cleta Alberts  Please update:  No heart issues at this time,  She is seeking a referral to [facility] (earliest is April 9th) hoping you can get her one sooner. She does have a gall stone   [phone]

## 2012-04-06 NOTE — Telephone Encounter (Signed)
Patient was seen in the ER and released home she will follow up with [facility] today.Amy

## 2012-04-07 NOTE — Telephone Encounter (Signed)
Please given note to referrals to call [facility] and see if we can get Shadow in as soon as possible so she can proceed with gallbladder surgery. She will need cardiac clearance prior to surgery

## 2012-04-07 NOTE — Telephone Encounter (Signed)
She is to see cardiology on 05/03/12/.

## 2012-04-07 NOTE — ED Provider Notes (Signed)
Medical screening examination/treatment/procedure(s) were performed by non-physician practitioner and as supervising physician I was immediately available for consultation/collaboration.    Carleene Cooper III, MD 04/07/12 1101

## 2012-04-07 NOTE — Telephone Encounter (Signed)
This is the one I spoke to you about earlier, he wants to see if you can help get her in earlier.

## 2012-04-07 NOTE — Telephone Encounter (Signed)
To Dr Cleta Alberts, she is scheduled for [facility] on 05/03/12

## 2012-04-07 NOTE — Telephone Encounter (Signed)
Please send a referral back to don and to see if she can get K[facility] seen earlier in the in one month from now. She needs to have her gallbladder surgery and she needs to get in next week.

## 2012-04-09 ENCOUNTER — Encounter: Payer: Self-pay | Admitting: Emergency Medicine

## 2012-04-10 ENCOUNTER — Encounter: Payer: Self-pay | Admitting: Emergency Medicine

## 2012-04-10 ENCOUNTER — Telehealth: Payer: Self-pay

## 2012-04-10 ENCOUNTER — Other Ambulatory Visit: Payer: Self-pay | Admitting: Emergency Medicine

## 2012-04-10 DIAGNOSIS — R9431 Abnormal electrocardiogram [ECG] [EKG]: Secondary | ICD-10-CM

## 2012-04-10 DIAGNOSIS — K8020 Calculus of gallbladder without cholecystitis without obstruction: Secondary | ICD-10-CM

## 2012-04-10 NOTE — Telephone Encounter (Signed)
Patient can't get in for appointment with [facility] until April 9th. She is working with referrals to move up the date but would like to talk to Dr Cleta Alberts about what to do until then.  Best [phone]

## 2012-04-10 NOTE — Telephone Encounter (Signed)
Pt would like a referral to a general surgeon would like a nurse to call her at [phone]

## 2012-04-10 NOTE — Telephone Encounter (Signed)
Referral made 

## 2012-04-13 ENCOUNTER — Ambulatory Visit (INDEPENDENT_AMBULATORY_CARE_PROVIDER_SITE_OTHER): Payer: 59 | Admitting: Surgery

## 2012-04-13 ENCOUNTER — Encounter (INDEPENDENT_AMBULATORY_CARE_PROVIDER_SITE_OTHER): Payer: Self-pay | Admitting: Surgery

## 2012-04-13 VITALS — BP 138/82 | HR 78 | Resp 18 | Ht 62.5 in | Wt 122.0 lb

## 2012-04-13 DIAGNOSIS — R1013 Epigastric pain: Secondary | ICD-10-CM

## 2012-04-13 DIAGNOSIS — K8020 Calculus of gallbladder without cholecystitis without obstruction: Secondary | ICD-10-CM

## 2012-04-13 DIAGNOSIS — R12 Heartburn: Secondary | ICD-10-CM | POA: Insufficient documentation

## 2012-04-13 DIAGNOSIS — K8010 Calculus of gallbladder with chronic cholecystitis without obstruction: Secondary | ICD-10-CM | POA: Insufficient documentation

## 2012-04-13 NOTE — Patient Instructions (Addendum)
See the Handout(s) we gave you.  Consider surgery.  Please call our office at [phone] if you wish to schedule surgery or if you have further questions / concerns.   Cholelithiasis Cholelithiasis (also called gallstones) is a form of gallbladder disease where gallstones form in your gallbladder. The gallbladder is a non-essential organ that stores bile made in the liver, which helps digest fats. Gallstones begin as small crystals and slowly grow into stones. Gallstone pain occurs when the gallbladder spasms, and a gallstone is blocking the duct. Pain can also occur when a stone passes out of the duct.  Women are more likely to develop gallstones than men. Other factors that increase the risk of gallbladder disease are:  Having multiple pregnancies. Physicians sometimes advise removing diseased gallbladders before future pregnancies.  Obesity.  Diets heavy in fried foods and fat.  Increasing age (older than 24).  Prolonged use of medications containing female hormones.  Diabetes mellitus.  Rapid weight loss.  Family history of gallstones (heredity). SYMPTOMS  Feeling sick to your stomach (nauseous).  Abdominal pain.  Yellowing of the skin (jaundice).  Sudden pain. It may persist from several minutes to several hours.  Worsening pain with deep breathing or when jarred.  Fever.  Tenderness to the touch. In some cases, when gallstones do not move into the bile duct, people have no pain or symptoms. These are called "silent" gallstones. TREATMENT In severe cases, emergency surgery may be required. HOME CARE INSTRUCTIONS   Only take over-the-counter or prescription medicines for pain, discomfort, or fever as directed by your caregiver.  Follow a low-fat diet until seen again. Fat causes the gallbladder to contract, which can result in pain.  Follow up as instructed. Attacks are almost always recurrent and surgery is usually required for permanent treatment. SEEK  IMMEDIATE MEDICAL CARE IF:   Your pain increases and is not controlled by medications.  You have an oral temperature above 102 F (38.9 C), not controlled by medication.  You develop nausea and vomiting. MAKE SURE YOU:   Understand these instructions.  Will watch your condition.  Will get help right away if you are not doing well or get worse. Document Released: 01/07/2005 Document Revised: 04/05/2011 Document Reviewed: 03/12/2010 [facility] Patient Information 2013 [place], [place].  GETTING TO GOOD BOWEL HEALTH. Irregular bowel habits such as constipation and diarrhea can lead to many problems over time.  Having one soft bowel movement a day is the most important way to prevent further problems.  The anorectal canal is designed to handle stretching and feces to safely manage our ability to get rid of solid waste (feces, poop, stool) out of our body.  BUT, hard constipated stools can act like ripping concrete bricks and diarrhea can be a burning fire to this very sensitive area of our body, causing inflamed hemorrhoids, anal fissures, increasing risk is perirectal abscesses, abdominal pain/bloating, an making irritable bowel worse.     The goal: ONE SOFT BOWEL MOVEMENT A DAY!  To have soft, regular bowel movements:    Drink at least 8 tall glasses of water a day.     Take plenty of fiber.  Fiber is the undigested part of plant food that passes into the colon, acting s "natures broom" to encourage bowel motility and movement.  Fiber can absorb and hold large amounts of water. This results in a larger, bulkier stool, which is soft and easier to pass. Work gradually over several weeks up to 6 servings a day of fiber (  25g a day even more if needed) in the form of: o Vegetables -- Root (potatoes, carrots, turnips), leafy green (lettuce, salad greens, celery, spinach), or cooked high residue (cabbage, broccoli, etc) o Fruit -- Fresh (unpeeled skin & pulp), Dried (prunes, apricots, cherries, etc ),   or stewed ( applesauce)  o Whole grain breads, pasta, etc (whole wheat)  o Bran cereals    Bulking Agents -- This type of water-retaining fiber generally is easily obtained each day by one of the following:  o Psyllium bran -- The psyllium plant is remarkable because its ground seeds can retain so much water. This product is available as Metamucil, Konsyl, Effersyllium, Per Diem Fiber, or the less expensive generic preparation in drug and health food stores. Although labeled a laxative, it really is not a laxative.  o Methylcellulose -- This is another fiber derived from wood which also retains water. It is available as Citrucel. o Polyethylene Glycol - and "artificial" fiber commonly called Miralax or Glycolax.  It is helpful for people with gassy or bloated feelings with regular fiber o Flax Seed - a less gassy fiber than psyllium   No reading or other relaxing activity while on the toilet. If bowel movements take longer than 5 minutes, you are too constipated   AVOID CONSTIPATION.  High fiber and water intake usually takes care of this.  Sometimes a laxative is needed to stimulate more frequent bowel movements, but    Laxatives are not a good long-term solution as it can wear the colon out. o Osmotics (Milk of Magnesia, Fleets phosphosoda, Magnesium citrate, MiraLax, GoLytely) are safer than  o Stimulants (Senokot, Castor Oil, Dulcolax, Ex Lax)    o Do not take laxatives for more than 7days in a row.    IF SEVERELY CONSTIPATED, try a Bowel Retraining Program: o Do not use laxatives.  o Eat a diet high in roughage, such as bran cereals and leafy vegetables.  o Drink six (6) ounces of prune or apricot juice each morning.  o Eat two (2) large servings of stewed fruit each day.  o Take one (1) heaping tablespoon of a psyllium-based bulking agent twice a day. Use sugar-free sweetener when possible to avoid excessive calories.  o Eat a normal breakfast.  o Set aside 15 minutes after breakfast to  sit on the toilet, but do not strain to have a bowel movement.  o If you do not have a bowel movement by the third day, use an enema and repeat the above steps.    Controlling diarrhea o Switch to liquids and simpler foods for a few days to avoid stressing your intestines further. o Avoid dairy products (especially milk & ice cream) for a short time.  The intestines often can lose the ability to digest lactose when stressed. o Avoid foods that cause gassiness or bloating.  Typical foods include beans and other legumes, cabbage, broccoli, and dairy foods.  Every person has some sensitivity to other foods, so listen to our body and avoid those foods that trigger problems for you. o Adding fiber (Citrucel, Metamucil, psyllium, Miralax) gradually can help thicken stools by absorbing excess fluid and retrain the intestines to act more normally.  Slowly increase the dose over a few weeks.  Too much fiber too soon can backfire and cause cramping & bloating. o Probiotics (such as active yogurt, Align, etc) may help repopulate the intestines and colon with normal bacteria and calm down a sensitive digestive tract.  Most studies show  it to be of mild help, though, and such products can be costly. o Medicines:   Bismuth subsalicylate (ex. Kayopectate, Pepto Bismol) every 30 minutes for up to 6 doses can help control diarrhea.  Avoid if pregnant.   Loperamide (Immodium) can slow down diarrhea.  Start with two tablets (4mg  total) first and then try one tablet every 6 hours.  Avoid if you are having fevers or severe pain.  If you are not better or start feeling worse, stop all medicines and call your doctor for advice o Call your doctor if you are getting worse or not better.  Sometimes further testing (cultures, endoscopy, X-ray studies, bloodwork, etc) may be needed to help diagnose and treat the cause of the diarrhea.  High-Fiber Diet Fiber is found in fruits, vegetables, and grains. A high-fiber diet encourages  the addition of more whole grains, legumes, fruits, and vegetables in your diet. The recommended amount of fiber for adult males is 38 g per day. For adult females, it is 25 g per day. Pregnant and lactating women should get 28 g of fiber per day. If you have a digestive or bowel problem, ask your caregiver for advice before adding high-fiber foods to your diet. Eat a variety of high-fiber foods instead of only a select few type of foods.  PURPOSE  To increase stool bulk.  To make bowel movements more regular to prevent constipation.  To lower cholesterol.  To prevent overeating. WHEN IS THIS DIET USED?  It may be used if you have constipation and hemorrhoids.  It may be used if you have uncomplicated diverticulosis (intestine condition) and irritable bowel syndrome.  It may be used if you need help with weight management.  It may be used if you want to add it to your diet as a protective measure against atherosclerosis, diabetes, and cancer. SOURCES OF FIBER  Whole-grain breads and cereals.  Fruits, such as apples, oranges, bananas, berries, prunes, and pears.  Vegetables, such as green peas, carrots, sweet potatoes, beets, broccoli, cabbage, spinach, and artichokes.  Legumes, such split peas, soy, lentils.  Almonds. FIBER CONTENT IN FOODS Starches and Grains / Dietary Fiber (g)  Cheerios, 1 cup / 3 g  Corn Flakes cereal, 1 cup / 0.7 g  Rice crispy treat cereal, 1 cup / 0.3 g  Instant oatmeal (cooked),  cup / 2 g  Frosted wheat cereal, 1 cup / 5.1 g  Brown, long-grain rice (cooked), 1 cup / 3.5 g  White, long-grain rice (cooked), 1 cup / 0.6 g  Enriched macaroni (cooked), 1 cup / 2.5 g Legumes / Dietary Fiber (g)  Baked beans (canned, plain, or vegetarian),  cup / 5.2 g  Kidney beans (canned),  cup / 6.8 g  Pinto beans (cooked),  cup / 5.5 g Breads and Crackers / Dietary Fiber (g)  Plain or honey graham crackers, 2 squares / 0.7 g  Saltine crackers, 3  squares / 0.3 g  Plain, salted pretzels, 10 pieces / 1.8 g  Whole-wheat bread, 1 slice / 1.9 g  White bread, 1 slice / 0.7 g  Raisin bread, 1 slice / 1.2 g  Plain bagel, 3 oz / 2 g  Flour tortilla, 1 oz / 0.9 g  Corn tortilla, 1 small / 1.5 g  Hamburger or hotdog bun, 1 small / 0.9 g Fruits / Dietary Fiber (g)  Apple with skin, 1 medium / 4.4 g  Sweetened applesauce,  cup / 1.5 g  Banana,  medium /  1.5 g  Grapes, 10 grapes / 0.4 g  Orange, 1 small / 2.3 g  Raisin, 1.5 oz / 1.6 g  Melon, 1 cup / 1.4 g Vegetables / Dietary Fiber (g)  Green beans (canned),  cup / 1.3 g  Carrots (cooked),  cup / 2.3 g  Broccoli (cooked),  cup / 2.8 g  Peas (cooked),  cup / 4.4 g  Mashed potatoes,  cup / 1.6 g  Lettuce, 1 cup / 0.5 g  Corn (canned),  cup / 1.6 g  Tomato,  cup / 1.1 g Document Released: 01/11/2005 Document Revised: 07/13/2011 Document Reviewed: 04/15/2011 [facility] Patient Information 2013 [place], [place].

## 2012-04-13 NOTE — Progress Notes (Signed)
Subjective:     Patient ID: Kristy Arias, female   DOB: [birth year], 62 y.o.   MRN: [id number]  HPI  Kristy Arias  11-Mar-1950 [id number]  Patient Care Team: Collene Gobble, MD as PCP - General (Family Medicine) Earl Many, MD as Referring Physician (Cardiology)  This patient is a 62 y.o.female who presents today for surgical evaluation at the request of Dr. Cleta Alberts.   Reason for visit: Epigastric and upper abdominal pain with nausea and bloating.  Gallstones.  Pleasant female.   Has some occasional constipation and loose stools.  Had an episode of explosive diarrhea after eating.  Was found to have gallstones.  Rest of workup otherwise negative.  Last week, had an episode of severe epigastric pain.  She woke up with it.  She had temperature up the night before.  The pain and bloating disc persisted all day.  She tried some Bentyl with not much help.  She took if you are said and seemed to sleep better.  However the pain persisted.  She saw her primary care physician.  There were some questionable EKG changes.  She went to the emergency room.  Workup was otherwise negative.  She is set up to see a cardiologist, Dr. Sherril Croon, her in early April.  Because of the pain and gallstone, surgical consultation was requested.  She notes she gets an occasional intermittent epigastric pain.  She has had a couple episodes of left subcostal pain as well.  No specific trigger with that.  She has some occasional heartburn/reflux.  It is mild.  Prilosec and Zantac usually helps that.  She takes TUMS rather regularly to help minimize osteoporosis.  She occasionally gets loose bowel movements with red meat so she has changed her diet to more of a low-fat diet.  Getting bloating more regularly now.  She was concerned.  She claims to have had a colonoscopy in done four years ago that was completely normal.  No hematochezia or melena.  Mild dysphagia to some solids but not liquids.  Not worsening.  Weight stable.  No  alcohol intake.  No history of hepatitis or pancreatitis.  No history of abdominal trauma or fall.  No history of jaundice.  Patient Active Problem List  Diagnosis  . Hypertension  . Fibromuscular dysplasia  . Hyperlipidemia  . Allergic rhinitis  . Arthritis  . Depression  . Gallstones, probable cholecystitis  . Abdominal pain, epigastric    Past Medical History  Diagnosis Date  . Anxiety   . Depression   . Glaucoma   . Hyperlipidemia   . Heart murmur   . Hypertension     Past Surgical History  Procedure Laterality Date  . Breast enhancement surgery    . Breast surgery    . Cosmetic surgery    . Dilation and curettage of uterus  2013    History   Social History  . Marital Status: Married    Spouse Name: N/A    Number of Children: N/A  . Years of Education: N/A   Occupational History  . Not on file.   Social History Main Topics  . Smoking status: Never Smoker   . Smokeless tobacco: Never Used  . Alcohol Use: No  . Drug Use: No  . Sexually Active: Yes    Birth Control/ Protection: Surgical   Other Topics Concern  . Not on file   Social History Narrative  . No narrative on file    Family History  Problem  Relation Age of Onset  . Hypertension Father   . Hypertension Maternal Grandmother   . Heart disease Maternal Grandmother   . Diabetes Maternal Grandfather   . Osteoporosis Paternal Grandmother   . Heart disease Paternal Grandfather     Current Outpatient Prescriptions  Medication Sig Dispense Refill  . ALPRAZolam (XANAX) 0.5 MG tablet Take 1 tablet (0.5 mg total) by mouth at bedtime as needed.  30 tablet  5  . aspirin 81 MG tablet Take 81 mg by mouth daily.      . butalbital-aspirin-caffeine-codeine (FIORINAL WITH CODEINE) 50-325-40-30 MG capsule Take 1 capsule by mouth every 4 (four) hours as needed.  30 capsule  5  . calcium carbonate (TUMS - DOSED IN MG ELEMENTAL CALCIUM) 500 MG chewable tablet Chew 1 tablet by mouth daily.      . cetirizine  (ZYRTEC) 10 MG tablet Take 10 mg by mouth daily.      . chlorpheniramine (CHLOR-TRIMETON) 4 MG tablet Take 4 mg by mouth 2 (two) times daily as needed for allergies.      . cholecalciferol (VITAMIN D) 1000 UNITS tablet Take 2,000 Units by mouth daily.      . diclofenac (VOLTAREN) 75 MG EC tablet Take 1 tablet (75 mg total) by mouth 2 (two) times daily.  60 tablet  5  . dicyclomine (BENTYL) 20 MG tablet Take 1 tablet 3 times a day  90 tablet  5  . DULoxetine (CYMBALTA) 20 MG capsule Take 1 capsule (20 mg total) by mouth daily.  30 capsule  11  . estradiol (ESTRACE) 1 MG tablet Take 1 mg by mouth daily.      Marland Kitchen levothyroxine (SYNTHROID, LEVOTHROID) 50 MCG tablet Take 1 tablet (50 mcg total) by mouth daily.  30 tablet  5  . lisinopril (PRINIVIL,ZESTRIL) 10 MG tablet Take 1 tablet (10 mg total) by mouth daily.  100 tablet  2  . nortriptyline (PAMELOR) 25 MG capsule Take 1 capsule (25 mg total) by mouth at bedtime.  30 capsule  11  . progesterone (PROMETRIUM) 100 MG capsule Take 200 mg by mouth at bedtime.       . rosuvastatin (CRESTOR) 5 MG tablet Take 1 tablet (5 mg total) by mouth daily.  30 tablet  11  . timolol (BETIMOL) 0.25 % ophthalmic solution 1-2 drops 2 (two) times daily.      . Travoprost, BAK Free, (TRAVATAN) 0.004 % SOLN ophthalmic solution 1 drop at bedtime.       No current facility-administered medications for this visit.     No Known Allergies  BP 138/82  Pulse 78  Resp 18  Ht 5' 2.5" (1.588 m)  Wt 122 lb (55.339 kg)  BMI 21.94 kg/m2  Dg Chest 2 View  04/05/2012  *RADIOLOGY REPORT*  Clinical Data: Chest pain  CHEST - 2 VIEW  Comparison: None.  Findings: The lungs are well-aerated and free from pulmonary edema, focal airspace consolidation or pulmonary nodule.  Cardiac and mediastinal contours are within normal limits.  No pneumothorax, or pleural effusion. No acute osseous findings. bilateral breast augmentation prostheses.  IMPRESSION:  No acute cardiopulmonary disease.    Original Report Authenticated By: Malachy Moan, M.D.    Dg Abd 1 View  03/23/2012  *RADIOLOGY REPORT*  Clinical Data: Diarrhea.  ABDOMEN - 1 VIEW  Comparison: No comparison plain film exam.  Findings: Left upper quadrant 1.1 cm calcified structure possibly a renal stone.  Mild degenerative changes lumbar spine.  Nonspecific bowel gas pattern without  plain film evidence of obstruction.  The possibility of free intraperitoneal air cannot be addressed on a supine view.  IMPRESSION: Left upper quadrant 1.1 cm calcified structure possibly a renal stone.  Mild degenerative changes lumbar spine.  Nonspecific bowel gas pattern without plain film evidence of obstruction.   Original Report Authenticated By: Lacy Duverney, M.D.    US Abdomen Complete  03/29/2012  *RADIOLOGY REPORT*  Clinical Data:  Abdominal pain, diarrhea  COMPLETE ABDOMINAL ULTRASOUND  Comparison:  Abdomen film of 03/23/2012  Findings:  Gallbladder:  The gallbladder is well visualized and there is a single mobile 1.5 cm gallstone present with acoustical shadowing. There is no pain over the gallbladder with compression.  Common bile duct:  The common bile duct is normal measuring 4.1 mm in diameter distally.  Liver:  The liver has a normal echogenic pattern.  No focal abnormality is seen.  IVC:  Appears normal.  Pancreas:  No focal abnormality seen.  Spleen:  The spleen is normal measuring 3.1 cm sagittally.  Right Kidney:  No hydronephrosis is seen.  The right kidney measures 9.6 cm sagittally.  Left Kidney:  No hydronephrosis is noted.  The left kidney measures 10.7 cm.  Abdominal aorta:  The abdominal aorta is normal in caliber.  IMPRESSION:  1.  1.5 cm mobile gallstone within the gallbladder.  No pain over the gallbladder. 2.  No ductal dilatation.   Original Report Authenticated By: Dwyane Dee, M.D.      Review of Systems  Constitutional: Positive for fatigue. Negative for fever, chills, diaphoresis and appetite change.  HENT: Positive for  sinus pressure. Negative for ear pain, sore throat, trouble swallowing, neck pain and ear discharge.   Eyes: Negative for photophobia, discharge and visual disturbance.  Respiratory: Negative for cough, choking, chest tightness and shortness of breath.   Cardiovascular: Negative for chest pain, palpitations and leg swelling.       Exercises ZUMBA 45 minutes at a time, two times a week.  Occasional shortness of breath after walking up two flights of stairs  Gastrointestinal: Positive for nausea, abdominal pain, diarrhea and constipation. Negative for vomiting, abdominal distention, anal bleeding and rectal pain.       Bowel movement every two days.  Occasional bouts of diarrhea  Genitourinary: Positive for frequency. Negative for dysuria, urgency and difficulty urinating.  Musculoskeletal: Positive for arthralgias. Negative for myalgias and gait problem.  Skin: Negative for color change, pallor and rash.  Neurological: Negative for dizziness, speech difficulty, weakness and numbness.  Hematological: Negative for adenopathy.  Psychiatric/Behavioral: Positive for sleep disturbance. Negative for confusion and agitation. The patient is not nervous/anxious.        Objective:   Physical Exam  Constitutional: She is oriented to person, place, and time. She appears well-developed and well-nourished. No distress.  HENT:  Head: Normocephalic.  Mouth/Throat: Oropharynx is clear and moist. No oropharyngeal exudate.  Eyes: Conjunctivae and EOM are normal. Pupils are equal, round, and reactive to light. No scleral icterus.  Neck: Normal range of motion. Neck supple. No tracheal deviation present.  Cardiovascular: Normal rate, regular rhythm and intact distal pulses.   Pulmonary/Chest: Effort normal and breath sounds normal. No respiratory distress. She exhibits no tenderness.  Abdominal: Soft. She exhibits no distension, no pulsatile liver and no mass. There is tenderness in the right upper quadrant and  epigastric area. There is no rigidity, no guarding, no CVA tenderness, no tenderness at McBurney's point and negative Murphy's sign. No hernia. Hernia confirmed negative in  the right inguinal area and confirmed negative in the left inguinal area.  Genitourinary: No vaginal discharge found.  Musculoskeletal: Normal range of motion. She exhibits no tenderness.  Lymphadenopathy:    She has no cervical adenopathy.       Right: No inguinal adenopathy present.       Left: No inguinal adenopathy present.  Neurological: She is alert and oriented to person, place, and time. No cranial nerve deficit. She exhibits normal muscle tone. Coordination normal.  Skin: Skin is warm and dry. No rash noted. She is not diaphoretic. No erythema.  Psychiatric: She has a normal mood and affect. Her behavior is normal. Judgment and thought content normal.  Mildly anxious but consolable.       Assessment:     Episodic severe epigastric and upper quadrant pain & diarrhea in the setting of a gallstone.  Rest of differential diagnosis seems less likely.  Subtle EKG changes but history of good exercise tolerance.  Cardiac evaluation pending.     Plan:     While she does not have the classic story / presentation, it is suggestive.  The rest of her differential diagnosis seems less likely.  I strongly recommend she keep her cardiac appointment.  They will probably do a completion workup.  Maybe an exercise stress tabs.  It cardiology does not feel that that is a source, then consider more aggressive treatment of her GI symptoms.  One option is to do surgery first and if not better then gastroenterology consultation.  The other option is to do gastroenterology consultation first to rule out heartburn reflux etc.  If that is normal, then consider surgery.  She does not like the idea of a gastroenterology evaluation 1st.  I did note that she can do a trial of PPIs with Prilosec twice a day for 3 weeks.  If no improvement in  three weeks, that argues against any significant esophagitis or gastritis.  After a long discussion with the patient & husband, she is more inclined to proceed with surgery and cholecystectomy.  Reasonable to do a single site technique.  She wants to keep her appointment with cardiology first.  If that is negative then proceed with surgery.  If she still has issues postoperatively, then she will agree to a gastroenterology evaluation.  I did discuss the procedure with them:  The anatomy & physiology of hepatobiliary & pancreatic function was discussed.  The pathophysiology of gallbladder dysfunction was discussed.  Natural history risks without surgery was discussed.   I feel the risks of no intervention will lead to serious problems that outweigh the operative risks; therefore, I recommended cholecystectomy to remove the pathology.  I explained laparoscopic techniques with possible need for an open approach.  Probable cholangiogram to evaluate the bilary tract was explained as well.    Risks such as bleeding, infection, abscess, leak, injury to other organs, need for further treatment, heart attack, death, and other risks were discussed.  I noted a good likelihood this will help address the problem.  Possibility that this will not correct all abdominal symptoms was explained.  Goals of post-operative recovery were discussed as well.  We will work to minimize complications.  An educational handout further explaining the pathology and treatment options was given as well.  Questions were answered.  The patient expresses understanding & wishes to proceed with surgery.

## 2012-04-14 NOTE — Progress Notes (Signed)
Please check and be sure her cardiology referral to [facility] has been done

## 2012-04-17 ENCOUNTER — Ambulatory Visit (INDEPENDENT_AMBULATORY_CARE_PROVIDER_SITE_OTHER): Payer: 59 | Admitting: Surgery

## 2012-04-25 HISTORY — PX: CARDIAC CATHETERIZATION: SHX172

## 2012-05-03 ENCOUNTER — Encounter: Payer: 59 | Admitting: Cardiology

## 2012-05-18 ENCOUNTER — Encounter (HOSPITAL_COMMUNITY): Payer: Self-pay

## 2012-05-23 ENCOUNTER — Ambulatory Visit (HOSPITAL_COMMUNITY)
Admission: RE | Admit: 2012-05-23 | Discharge: 2012-05-23 | Disposition: A | Discharge status: Home / Self Care | Payer: 59 | Source: Ambulatory Visit | Attending: Cardiology | Admitting: Cardiology

## 2012-05-23 ENCOUNTER — Encounter (HOSPITAL_COMMUNITY)
Admission: RE | Disposition: A | Discharge status: Home / Self Care | Payer: Self-pay | Source: Ambulatory Visit | Attending: Cardiology

## 2012-05-23 DIAGNOSIS — R079 Chest pain, unspecified: Secondary | ICD-10-CM | POA: Insufficient documentation

## 2012-05-23 DIAGNOSIS — E039 Hypothyroidism, unspecified: Secondary | ICD-10-CM | POA: Insufficient documentation

## 2012-05-23 DIAGNOSIS — R0609 Other forms of dyspnea: Secondary | ICD-10-CM | POA: Insufficient documentation

## 2012-05-23 DIAGNOSIS — R0989 Other specified symptoms and signs involving the circulatory and respiratory systems: Secondary | ICD-10-CM | POA: Insufficient documentation

## 2012-05-23 DIAGNOSIS — Z8249 Family history of ischemic heart disease and other diseases of the circulatory system: Secondary | ICD-10-CM | POA: Insufficient documentation

## 2012-05-23 DIAGNOSIS — Z79899 Other long term (current) drug therapy: Secondary | ICD-10-CM | POA: Insufficient documentation

## 2012-05-23 DIAGNOSIS — R9439 Abnormal result of other cardiovascular function study: Secondary | ICD-10-CM | POA: Insufficient documentation

## 2012-05-23 DIAGNOSIS — I10 Essential (primary) hypertension: Secondary | ICD-10-CM | POA: Insufficient documentation

## 2012-05-23 DIAGNOSIS — E785 Hyperlipidemia, unspecified: Secondary | ICD-10-CM | POA: Insufficient documentation

## 2012-05-23 DIAGNOSIS — Z7982 Long term (current) use of aspirin: Secondary | ICD-10-CM | POA: Insufficient documentation

## 2012-05-23 HISTORY — PX: LEFT HEART CATHETERIZATION WITH CORONARY ANGIOGRAM: SHX5451

## 2012-05-23 SURGERY — LEFT HEART CATHETERIZATION WITH CORONARY ANGIOGRAM
Anesthesia: LOCAL

## 2012-05-23 MED ORDER — SODIUM CHLORIDE 0.9 % IV SOLN: 250.0000 mL | INTRAVENOUS | Status: DC | PRN

## 2012-05-23 MED ORDER — HEPARIN SODIUM (PORCINE) 1000 UNIT/ML IJ SOLN
INTRAMUSCULAR | Status: AC
  Filled 2012-05-23: qty 1

## 2012-05-23 MED ORDER — SODIUM CHLORIDE 0.9 % IV SOLN
INTRAVENOUS | Status: DC
  Administered 2012-05-23: 09:00:00 via INTRAVENOUS

## 2012-05-23 MED ORDER — SODIUM CHLORIDE 0.9 % IJ SOLN: 3.0000 mL | Freq: Two times a day (BID) | INTRAMUSCULAR | Status: DC

## 2012-05-23 MED ORDER — LIDOCAINE HCL (PF) 1 % IJ SOLN
INTRAMUSCULAR | Status: AC
  Filled 2012-05-23: qty 30

## 2012-05-23 MED ORDER — VERAPAMIL HCL 2.5 MG/ML IV SOLN
INTRAVENOUS | Status: AC
  Filled 2012-05-23: qty 2

## 2012-05-23 MED ORDER — ASPIRIN 81 MG PO CHEW
CHEWABLE_TABLET | ORAL | Status: AC
  Administered 2012-05-23: 324 mg via ORAL
  Filled 2012-05-23: qty 4

## 2012-05-23 MED ORDER — SODIUM CHLORIDE 0.9 % IV SOLN: INTRAVENOUS | Status: AC

## 2012-05-23 MED ORDER — SODIUM CHLORIDE 0.9 % IJ SOLN: 3.0000 mL | INTRAMUSCULAR | Status: DC | PRN

## 2012-05-23 MED ORDER — ACETAMINOPHEN 325 MG PO TABS: 650.0000 mg | ORAL_TABLET | ORAL | Status: DC | PRN

## 2012-05-23 MED ORDER — ASPIRIN 81 MG PO CHEW: 324.0000 mg | CHEWABLE_TABLET | ORAL | Status: AC

## 2012-05-23 MED ORDER — MIDAZOLAM HCL 2 MG/2ML IJ SOLN
INTRAMUSCULAR | Status: AC
  Filled 2012-05-23: qty 2

## 2012-05-23 MED ORDER — HEPARIN (PORCINE) IN NACL 2-0.9 UNIT/ML-% IJ SOLN
INTRAMUSCULAR | Status: AC
  Filled 2012-05-23: qty 1000

## 2012-05-23 MED ORDER — ONDANSETRON HCL 4 MG/2ML IJ SOLN: 4.0000 mg | Freq: Four times a day (QID) | INTRAMUSCULAR | Status: DC | PRN

## 2012-05-23 MED ORDER — NITROGLYCERIN 1 MG/10 ML FOR IR/CATH LAB
INTRA_ARTERIAL | Status: AC
  Filled 2012-05-23: qty 10

## 2012-05-23 MED ORDER — FENTANYL CITRATE 0.05 MG/ML IJ SOLN
INTRAMUSCULAR | Status: AC
  Filled 2012-05-23: qty 2

## 2012-05-23 NOTE — H&P (Signed)
See the written copy of this report in the patient's paper medical record.

## 2012-05-23 NOTE — CV Procedure (Signed)
Procedure performed:  Left heart catheterization including hemodynamic monitoring of the left ventricle, LV gram, selective right and left coronary arteriography.  Indication patient is a 62 year-old female with history of hypertension,  hyperlipidemia, who presents with chest pain and dyspnea. Patient has  had non invasive testing which was abnormal revealing horizontal ST depression and positive for ischemia.  Hence is brought to the cardiac catheterization lab to evaluate the  coronary anatomy for definitive diagnosis of CAD.  Hemodynamic data:  Left ventricular pressure was 133/6 with LVEDP of 10 mm mercury. Aortic pressure was 132/70 with a mean of 96 mm mercury. There was no pressure gradient across the aortic valve  Left ventricle: Performed in the RAO projection revealed LVEF of 55-60%. There was No MR. No wall motion abnormality.  Right coronary artery: The vessel is smooth, normal, Dominant.  Left main coronary artery is large and normal.  Circumflex coronary artery: A large vessel giving origin to a large obtuse marginal 1. It is smooth and normal.   LAD:  LAD gives origin to a large diagonal-1.    Impression: Normal coronary arteries with normal left systolic function and normal left end-diastolic pressure. Recommend evaluation for noncardiac causes of chest pain.  Technique: Under sterile precautions using a 6 French right radial  arterial access, a 6 French sheath was introduced into the right radial artery. A 5 [place] Tig 4 catheter was advanced into the ascending aorta selective  right coronary artery and left coronary artery was cannulated and angiography was performed in multiple views. The catheter was pulled back Out of the body over exchange length J-wire.  35F pigtail Catheter was used to perform LV gram which was performed in LAO projection. Catheter exchanged out of the body over J-Wire. NO immediate complications noted. Patient tolerated the procedure well.  Hemostasis  was obtained by applying TR band.  Rec: Medical therapy, primary prevention with aggressive risk factor reduction. Will be discharged home today with outpatient follow up.

## 2012-05-23 NOTE — Interval H&P Note (Signed)
History and Physical Interval Note:  05/23/2012 9:31 AM  Kristy Arias  has presented today for surgery, with the diagnosis of Chest pain  The various methods of treatment have been discussed with the patient and family. After consideration of risks, benefits and other options for treatment, the patient has consented to  Procedure(s): LEFT HEART CATHETERIZATION WITH CORONARY ANGIOGRAM (N/A) and possible angioplasty as a surgical intervention .  The patient's history has been reviewed, patient examined, no change in status, stable for surgery.  I have reviewed the patient's chart and labs.  Questions were answered to the patient's satisfaction.     [name]

## 2012-06-01 ENCOUNTER — Telehealth (INDEPENDENT_AMBULATORY_CARE_PROVIDER_SITE_OTHER): Payer: Self-pay

## 2012-06-01 NOTE — Telephone Encounter (Signed)
Pt had her appt with Dr. Jacinto Halim yesterday for cardiac clearance.  Pls let her know as soon as we receive clearance.

## 2012-06-08 ENCOUNTER — Encounter (INDEPENDENT_AMBULATORY_CARE_PROVIDER_SITE_OTHER): Payer: Self-pay

## 2012-06-08 ENCOUNTER — Telehealth (INDEPENDENT_AMBULATORY_CARE_PROVIDER_SITE_OTHER): Payer: Self-pay

## 2012-06-08 NOTE — Telephone Encounter (Signed)
Patient called in asking if we received clearance yet. After speaking to [place] I let her know we have not received it. She will call their office and see what the hold up is and call us back.

## 2012-06-09 ENCOUNTER — Telehealth (INDEPENDENT_AMBULATORY_CARE_PROVIDER_SITE_OTHER): Payer: Self-pay

## 2012-06-09 NOTE — Telephone Encounter (Signed)
Calling to inquire about cardiac clearance from [facility], Dr. Jacinto Halim.  Pls call.

## 2012-06-09 NOTE — Telephone Encounter (Signed)
Pt notified cardiac clearance rec'd.  Orders will go to the schedulers today, and she should hear from them no later than Monday.  Pt was appreciative.

## 2012-06-22 ENCOUNTER — Encounter: Payer: Self-pay | Admitting: Emergency Medicine

## 2012-06-22 ENCOUNTER — Other Ambulatory Visit (INDEPENDENT_AMBULATORY_CARE_PROVIDER_SITE_OTHER): Payer: Self-pay | Admitting: Surgery

## 2012-06-26 ENCOUNTER — Encounter (HOSPITAL_COMMUNITY)
Admission: RE | Admit: 2012-06-26 | Discharge: 2012-06-26 | Disposition: A | Discharge status: Home / Self Care | Payer: 59 | Source: Ambulatory Visit | Attending: Surgery | Admitting: Surgery

## 2012-06-26 ENCOUNTER — Encounter (HOSPITAL_COMMUNITY): Payer: Self-pay

## 2012-06-26 ENCOUNTER — Encounter (HOSPITAL_COMMUNITY): Payer: Self-pay | Admitting: Pharmacy Technician

## 2012-06-26 HISTORY — DX: Shortness of breath: R06.02

## 2012-06-26 HISTORY — DX: Other forms of angina pectoris: I20.8

## 2012-06-26 HISTORY — DX: Other forms of angina pectoris: I20.89

## 2012-06-26 HISTORY — DX: Hypothyroidism, unspecified: E03.9

## 2012-06-26 LAB — BASIC METABOLIC PANEL
BUN: 16 mg/dL (ref 6–23)
CO2: 25 mEq/L (ref 19–32)
Chloride: 104 mEq/L (ref 96–112)
Creatinine, Ser: 1.03 mg/dL (ref 0.50–1.10)
Glucose, Bld: 93 mg/dL (ref 70–99)
Potassium: 4.4 mEq/L (ref 3.5–5.1)

## 2012-06-26 LAB — CBC
HCT: 34.8 % — ABNORMAL LOW (ref 36.0–46.0)
Hemoglobin: 11.3 g/dL — ABNORMAL LOW (ref 12.0–15.0)
MCH: 29.9 pg (ref 26.0–34.0)
MCHC: 32.5 g/dL (ref 30.0–36.0)

## 2012-06-26 NOTE — Progress Notes (Signed)
Chest x ray, EKG 3/14/ EPIC,  Eccho, stress test, cardiac cath 4/14,  and CLEARANCE DR VYAS [facility]

## 2012-06-26 NOTE — Patient Instructions (Addendum)
20 Kristy Arias  06/26/2012   Your procedure is scheduled on:  06/29/12  THURSDAY  Report to [facility] at  1030     AM.  Call this number if you have problems the morning of surgery: [phone]       Remember:   Do not eat food :After Midnight. Wednesday NIGHT-    MAY HAVE CLEAR LIQUIDS Thursday MORNING UNTIL 0700 AM,  THEN NOTHING BY MOUTH   Take these medicines the morning of surgery with A SIP OF WATER:  Zyrtec, Nexium, Synthroid   .  Contacts, dentures or partial plates can not be worn to surgery  Leave suitcase in the car. After surgery it may be brought to your room.  For patients admitted to the hospital, checkout time is 11:00 AM day of  discharge.             SPECIAL INSTRUCTIONS- SEE [facility] PREPARING FOR SURGERY INSTRUCTION SHEET-     DO NOT WEAR JEWELRY, LOTIONS, POWDERS, OR PERFUMES.  WOMEN-- DO NOT SHAVE LEGS OR UNDERARMS FOR 12 HOURS BEFORE SHOWERS. MEN MAY SHAVE FACE.  Patients discharged the day of surgery will not be allowed to drive home. IF going home the day of surgery, you must have a driver and someone to stay with you for the first 24 hours  Name and phone number of your driver:       Bill husband                                                                 Please read over the following fact sheets that you were given: MRSA Information, Incentive Spirometry Sheet, Blood Transfusion Sheet  Information                                                                                   Kristy Arias  PST 336  [id number]                 FAILURE TO FOLLOW THESE INSTRUCTIONS MAY RESULT IN  CANCELLATION   OF YOUR SURGERY                                                  Patient Signature _____________________________

## 2012-06-29 ENCOUNTER — Encounter (HOSPITAL_COMMUNITY)
Admission: RE | Disposition: A | Discharge status: Home / Self Care | Payer: Self-pay | Source: Ambulatory Visit | Attending: Surgery

## 2012-06-29 ENCOUNTER — Ambulatory Visit (HOSPITAL_COMMUNITY)
Admission: RE | Admit: 2012-06-29 | Discharge: 2012-06-29 | Disposition: A | Discharge status: Home / Self Care | Payer: 59 | Source: Ambulatory Visit | Attending: Surgery | Admitting: Surgery

## 2012-06-29 ENCOUNTER — Encounter (HOSPITAL_COMMUNITY): Payer: Self-pay | Admitting: *Deleted

## 2012-06-29 ENCOUNTER — Ambulatory Visit (HOSPITAL_COMMUNITY): Payer: 59

## 2012-06-29 ENCOUNTER — Ambulatory Visit (HOSPITAL_COMMUNITY): Payer: 59 | Admitting: Anesthesiology

## 2012-06-29 ENCOUNTER — Encounter (HOSPITAL_COMMUNITY): Payer: Self-pay | Admitting: Anesthesiology

## 2012-06-29 DIAGNOSIS — E039 Hypothyroidism, unspecified: Secondary | ICD-10-CM | POA: Insufficient documentation

## 2012-06-29 DIAGNOSIS — Z01812 Encounter for preprocedural laboratory examination: Secondary | ICD-10-CM | POA: Insufficient documentation

## 2012-06-29 DIAGNOSIS — K8010 Calculus of gallbladder with chronic cholecystitis without obstruction: Secondary | ICD-10-CM | POA: Diagnosis present

## 2012-06-29 DIAGNOSIS — I10 Essential (primary) hypertension: Secondary | ICD-10-CM | POA: Insufficient documentation

## 2012-06-29 DIAGNOSIS — E785 Hyperlipidemia, unspecified: Secondary | ICD-10-CM | POA: Insufficient documentation

## 2012-06-29 HISTORY — PX: LAPAROSCOPIC CHOLECYSTECTOMY SINGLE PORT: SHX5891

## 2012-06-29 SURGERY — LAPAROSCOPIC CHOLECYSTECTOMY SINGLE SITE
Anesthesia: General | Wound class: Clean Contaminated

## 2012-06-29 MED ORDER — LACTATED RINGERS IV SOLN
INTRAVENOUS | Status: DC
  Administered 2012-06-29: 15:00:00 via INTRAVENOUS
  Administered 2012-06-29: 1000 mL via INTRAVENOUS

## 2012-06-29 MED ORDER — ACETAMINOPHEN 650 MG RE SUPP: 650.0000 mg | RECTAL | Status: DC | PRN

## 2012-06-29 MED ORDER — OXYCODONE HCL 5 MG PO TABS: 5.0000 mg | ORAL_TABLET | ORAL | Status: DC | PRN

## 2012-06-29 MED ORDER — EPHEDRINE SULFATE 50 MG/ML IJ SOLN
INTRAMUSCULAR | Status: DC | PRN
  Administered 2012-06-29: 10 mg via INTRAVENOUS
  Administered 2012-06-29: 5 mg via INTRAVENOUS

## 2012-06-29 MED ORDER — BUPIVACAINE-EPINEPHRINE 0.25% -1:200000 IJ SOLN
INTRAMUSCULAR | Status: DC | PRN
  Administered 2012-06-29: 50 mL

## 2012-06-29 MED ORDER — LACTATED RINGERS IR SOLN
Status: DC | PRN
  Administered 2012-06-29: 1000 mL

## 2012-06-29 MED ORDER — FENTANYL CITRATE 0.05 MG/ML IJ SOLN: 25.0000 ug | INTRAMUSCULAR | Status: DC | PRN

## 2012-06-29 MED ORDER — ROCURONIUM BROMIDE 100 MG/10ML IV SOLN
INTRAVENOUS | Status: DC | PRN
  Administered 2012-06-29: 40 mg via INTRAVENOUS

## 2012-06-29 MED ORDER — HYDROMORPHONE HCL PF 1 MG/ML IJ SOLN
INTRAMUSCULAR | Status: DC | PRN
  Administered 2012-06-29 (×2): 1 mg via INTRAVENOUS

## 2012-06-29 MED ORDER — SODIUM CHLORIDE 0.9 % IJ SOLN: 3.0000 mL | Freq: Two times a day (BID) | INTRAMUSCULAR | Status: DC

## 2012-06-29 MED ORDER — FENTANYL CITRATE 0.05 MG/ML IJ SOLN
INTRAMUSCULAR | Status: DC | PRN
  Administered 2012-06-29 (×3): 50 ug via INTRAVENOUS

## 2012-06-29 MED ORDER — LACTATED RINGERS IV SOLN: INTRAVENOUS | Status: DC

## 2012-06-29 MED ORDER — GLYCOPYRROLATE 0.2 MG/ML IJ SOLN
INTRAMUSCULAR | Status: DC | PRN
  Administered 2012-06-29: 0.6 mg via INTRAVENOUS

## 2012-06-29 MED ORDER — BUPIVACAINE-EPINEPHRINE 0.25% -1:200000 IJ SOLN
INTRAMUSCULAR | Status: AC
  Filled 2012-06-29: qty 1

## 2012-06-29 MED ORDER — PROMETHAZINE HCL 25 MG/ML IJ SOLN: 6.2500 mg | INTRAMUSCULAR | Status: DC | PRN

## 2012-06-29 MED ORDER — IOHEXOL 300 MG/ML  SOLN
INTRAMUSCULAR | Status: AC
  Filled 2012-06-29: qty 1

## 2012-06-29 MED ORDER — KETOROLAC TROMETHAMINE 30 MG/ML IJ SOLN
INTRAMUSCULAR | Status: DC | PRN
  Administered 2012-06-29: 30 mg via INTRAVENOUS

## 2012-06-29 MED ORDER — PROPOFOL 10 MG/ML IV BOLUS
INTRAVENOUS | Status: DC | PRN
  Administered 2012-06-29: 150 mg via INTRAVENOUS

## 2012-06-29 MED ORDER — MIDAZOLAM HCL 5 MG/5ML IJ SOLN
INTRAMUSCULAR | Status: DC | PRN
  Administered 2012-06-29: 2 mg via INTRAVENOUS

## 2012-06-29 MED ORDER — LABETALOL HCL 5 MG/ML IV SOLN
INTRAVENOUS | Status: DC | PRN
  Administered 2012-06-29 (×2): 5 mg via INTRAVENOUS

## 2012-06-29 MED ORDER — ACETAMINOPHEN 325 MG PO TABS: 650.0000 mg | ORAL_TABLET | ORAL | Status: DC | PRN

## 2012-06-29 MED ORDER — METOCLOPRAMIDE HCL 5 MG/ML IJ SOLN
INTRAMUSCULAR | Status: DC | PRN
  Administered 2012-06-29: 10 mg via INTRAVENOUS

## 2012-06-29 MED ORDER — SODIUM CHLORIDE 0.9 % IV SOLN: 250.0000 mL | INTRAVENOUS | Status: DC | PRN

## 2012-06-29 MED ORDER — ONDANSETRON HCL 4 MG/2ML IJ SOLN: 4.0000 mg | Freq: Four times a day (QID) | INTRAMUSCULAR | Status: DC | PRN

## 2012-06-29 MED ORDER — NEOSTIGMINE METHYLSULFATE 1 MG/ML IJ SOLN
INTRAMUSCULAR | Status: DC | PRN
  Administered 2012-06-29: 5 mg via INTRAVENOUS

## 2012-06-29 MED ORDER — PHENYLEPHRINE HCL 10 MG/ML IJ SOLN
INTRAMUSCULAR | Status: DC | PRN
  Administered 2012-06-29: 80 ug via INTRAVENOUS
  Administered 2012-06-29: 40 ug via INTRAVENOUS

## 2012-06-29 MED ORDER — IOHEXOL 300 MG/ML  SOLN
INTRAMUSCULAR | Status: DC | PRN
  Administered 2012-06-29: 50 mL

## 2012-06-29 MED ORDER — SODIUM CHLORIDE 0.9 % IJ SOLN: 3.0000 mL | INTRAMUSCULAR | Status: DC | PRN

## 2012-06-29 MED ORDER — 0.9 % SODIUM CHLORIDE (POUR BTL) OPTIME
TOPICAL | Status: DC | PRN
  Administered 2012-06-29: 1000 mL

## 2012-06-29 MED ORDER — DEXAMETHASONE SODIUM PHOSPHATE 10 MG/ML IJ SOLN
INTRAMUSCULAR | Status: DC | PRN
  Administered 2012-06-29: 10 mg via INTRAVENOUS

## 2012-06-29 SURGICAL SUPPLY — 47 items
APPLIER CLIP ROT 10 11.4 M/L (STAPLE)
APPLIER CLIP UNV 5X34 EPIX (ENDOMECHANICALS) ×2 IMPLANT
APR CLP MED LRG 11.4X10 (STAPLE)
APR XCLPCLP 20M/L UNV 34X5 (ENDOMECHANICALS) ×1
BAG SPEC RTRVL LRG 6X4 10 (ENDOMECHANICALS)
CABLE HIGH FREQUENCY MONO STRZ (ELECTRODE) ×2 IMPLANT
CANISTER SUCTION 2500CC (MISCELLANEOUS) ×2 IMPLANT
CLIP APPLIE ROT 10 11.4 M/L (STAPLE) IMPLANT
CLOTH BEACON ORANGE TIMEOUT ST (SAFETY) ×2 IMPLANT
COVER MAYO STAND STRL (DRAPES) ×2 IMPLANT
DECANTER SPIKE VIAL GLASS SM (MISCELLANEOUS) ×2 IMPLANT
DRAIN CHANNEL 19F RND (DRAIN) IMPLANT
DRAPE C-ARM 42X120 X-RAY (DRAPES) ×2 IMPLANT
DRAPE LAPAROSCOPIC ABDOMINAL (DRAPES) ×2 IMPLANT
DRAPE UTILITY XL STRL (DRAPES) ×1 IMPLANT
DRAPE WARM FLUID 44X44 (DRAPE) ×2 IMPLANT
DRSG TEGADERM 4X4.75 (GAUZE/BANDAGES/DRESSINGS) ×1 IMPLANT
ELECT REM PT RETURN 9FT ADLT (ELECTROSURGICAL) ×2
ELECTRODE REM PT RTRN 9FT ADLT (ELECTROSURGICAL) ×1 IMPLANT
EVACUATOR SILICONE 100CC (DRAIN) IMPLANT
GAUZE SPONGE 2X2 8PLY STRL LF (GAUZE/BANDAGES/DRESSINGS) ×1 IMPLANT
GLOVE BIOGEL PI IND STRL 7.0 (GLOVE) ×1 IMPLANT
GLOVE BIOGEL PI INDICATOR 7.0 (GLOVE) ×1
GLOVE ECLIPSE 8.0 STRL XLNG CF (GLOVE) ×2 IMPLANT
GLOVE INDICATOR 8.0 STRL GRN (GLOVE) ×2 IMPLANT
GOWN STRL NON-REIN LRG LVL3 (GOWN DISPOSABLE) IMPLANT
GOWN STRL REIN XL XLG (GOWN DISPOSABLE) ×7 IMPLANT
IV LACTATED RINGER IRRG 3000ML (IV SOLUTION) ×2
IV LR IRRIG 3000ML ARTHROMATIC (IV SOLUTION) ×1 IMPLANT
KIT BASIN OR (CUSTOM PROCEDURE TRAY) ×2 IMPLANT
NS IRRIG 1000ML POUR BTL (IV SOLUTION) ×2 IMPLANT
POUCH SPECIMEN RETRIEVAL 10MM (ENDOMECHANICALS) IMPLANT
SCALPEL HARMONIC ACE (MISCELLANEOUS) ×2 IMPLANT
SCISSORS LAP 5X35 DISP (ENDOMECHANICALS) ×1 IMPLANT
SET CHOLANGIOGRAPH MIX (MISCELLANEOUS) ×2 IMPLANT
SET IRRIG TUBING LAPAROSCOPIC (IRRIGATION / IRRIGATOR) ×2 IMPLANT
SOLUTION ANTI FOG 6CC (MISCELLANEOUS) IMPLANT
SPONGE GAUZE 2X2 STER 10/PKG (GAUZE/BANDAGES/DRESSINGS)
STRIP CLOSURE SKIN 1/2X4 (GAUZE/BANDAGES/DRESSINGS) IMPLANT
SUT MNCRL AB 4-0 PS2 18 (SUTURE) ×2 IMPLANT
SUT VICRYL 0 TIES 12 18 (SUTURE) IMPLANT
TOWEL OR 17X26 10 PK STRL BLUE (TOWEL DISPOSABLE) ×2 IMPLANT
TOWEL OR NON WOVEN STRL DISP B (DISPOSABLE) ×1 IMPLANT
TRAY LAP CHOLE (CUSTOM PROCEDURE TRAY) ×2 IMPLANT
TROCAR 5M 150ML BLDLS (TROCAR) ×2 IMPLANT
TROCAR XCEL NON-BLD 5MMX100MML (ENDOMECHANICALS) ×2 IMPLANT
TUBING INSUFFLATION 10FT LAP (TUBING) ×2 IMPLANT

## 2012-06-29 NOTE — Transfer of Care (Signed)
Immediate Anesthesia Transfer of Care Note  Patient: Kristy Arias  Procedure(s) Performed: Procedure(s): LAPAROSCOPIC CHOLECYSTECTOMY SINGLE PORT IOC  (N/A)  Patient Location: PACU  Anesthesia Type:General  Level of Consciousness: awake, sedated, patient cooperative and responds to stimulation, drowsy, ventilating well  Airway & Oxygen Therapy: Patient Spontanous Breathing and Patient connected to face mask oxygen  Post-op Assessment: Report given to PACU RN, Post -op Vital signs reviewed and stable and Patient moving all extremities X 4  Post vital signs: Reviewed and stable  Complications: No apparent anesthesia complications

## 2012-06-29 NOTE — Op Note (Signed)
06/29/2012  3:43 PM  PATIENT:  Kristy Arias  62 y.o. female  Patient Care Team: Collene Gobble, MD as PCP - General (Family Medicine) Earl Many, MD as Referring Physician (Cardiology)  PRE-OPERATIVE DIAGNOSIS:  biliary colic  POST-OPERATIVE DIAGNOSIS:  biliary colic, chronic cholecystitis  PROCEDURE:  Procedure(s): LAPAROSCOPIC CHOLECYSTECTOMY SINGLE PORT IOC   SURGEON:  Surgeon(s): Ardeth Sportsman, MD  ASSISTANT: none   ANESTHESIA:   local and general  EBL:  Total I/O In: 1000 [I.V.:1000] Out: -   Delay start of Pharmacological VTE agent (>24hrs) due to surgical blood loss or risk of bleeding:  no  DRAINS: none   SPECIMEN:  Source of Specimen:  Gallbladder  DISPOSITION OF SPECIMEN:  PATHOLOGY  COUNTS:  YES  PLAN OF CARE: Discharge to home after PACU  PATIENT DISPOSITION:  PACU - hemodynamically stable.  INDICATION: Patient with abdominal pain.  Workup suspicious for gallstone etiology.  Cardiac workup negative.  Rest of differential diagnosis this has seem less likely.  Persistent attacks.  I recommended surgery:  The anatomy & physiology of hepatobiliary & pancreatic function was discussed.  The pathophysiology of gallbladder dysfunction was discussed.  Natural history risks without surgery was discussed.   I feel the risks of no intervention will lead to serious problems that outweigh the operative risks; therefore, I recommended cholecystectomy to remove the pathology.  I explained laparoscopic techniques with possible need for an open approach.  Probable cholangiogram to evaluate the bilary tract was explained as well.    Risks such as bleeding, infection, abscess, leak, injury to other organs, need for further treatment, heart attack, death, and other risks were discussed.  I noted a good likelihood this will help address the problem.  Possibility that this will not correct all abdominal symptoms was explained.  Goals of post-operative recovery were discussed  as well.  We will work to minimize complications.  An educational handout further explaining the pathology and treatment options was given as well.  Questions were answered.  The patient expresses understanding & wishes to proceed with surgery.   OR FINDINGS: Dilated gallbladder with some thickening.  1 cm cuboid stones.  Cholangiography within normal limits and classic anatomy  DESCRIPTION:   The patient was identified & brought in the operating room. The patient was positioned supine with arms tucked. SCDs were active during the entire case. The patient underwent general anesthesia without any difficulty.  The abdomen was prepped and draped in a sterile fashion. A Surgical Timeout confirmed our plan.  I made a transverse curvilinear incision through the superior umbilical fold.  I placed a 5mm long port through the supraumbilical fascia using a modified Hassan cutdown technique. I began carbon dioxide insufflation. Camera inspection revealed no injury. There were no adhesions to the anterior abdominal wall supraumbilically.  I proceeded to continue with single site technique. I placed a #5 port in left upper aspect of the wound. I placed a 5 mm atraumatic grasper in the right inferior aspect of the wound.  I turned attention to the right upper quadrant.  Gallbladder was markedly dilated with some thickening suspicious for chronic cholecystitis.  The gallbladder fundus was elevated cephalad. I freed the peritoneal coverings between the gallbladder and the liver on the posteriolateral and anteriomedial walls. I alternated between Harmonic & blunt Maryland dissection to help get a good critical view of the cystic artery and cystic duct. I did further dissection to free a few centimeters of the  gallbladder off the  liver bed to get a good critical view of the infundibulum and cystic duct. I mobilized the cystic artery; and, after getting a good 360 view, ligated the cystic artery using the Harmonic  ultrasonic dissection. I skeletonized the cystic duct.  I placed a clip on the infundibulum. I did a partial cystic duct-otomy and ensured patency. I placed a 5 [place] cholangiocatheter through a puncture site at the right subcostal ridge of the abdominal wall and directed it into the cystic duct.  We ran a cholangiogram with dilute radio-opaque contrast and continuous fluoroscopy.  Contrast flowed from a side branch consistent with cystic duct cannulization. Contrast flowed up the common hepatic duct into the right and left intrahepatic chains out to secondary radicals. Contrast flowed down the common bile duct easily across the normal ampulla into the duodenum.  This was consistent with a normal cholangiogram.  I removed the cholangiocatheter. I placed clips on the cystic duct x4.  I completed cystic duct transection. I freed the gallbladder from its remaining attachments to the liver. I ensured hemostasis on the gallbladder fossa of the liver and elsewhere. I inspected the rest of the abdomen & detected no injury nor bleeding elsewhere.  I removed the gallbladder out the supraumbilical fascia. I closed the fascia transversely using 0 Vicryl interrupted stitches. A closed the skin using 4-0 monocryl stitch.  Sterile dressing was applied. The patient was extubated & arrived in the PACU in stable condition..  I had discussed postoperative care with the patient in the holding area.  I am about to locate the patient's family and discuss operative findings and postoperative goals / instructions.  Instructions are written in the chart as well.

## 2012-06-29 NOTE — Anesthesia Postprocedure Evaluation (Signed)
Anesthesia Post Note  Patient: Kristy Arias  Procedure(s) Performed: Procedure(s) (LRB): LAPAROSCOPIC CHOLECYSTECTOMY SINGLE PORT IOC  (N/A)  Anesthesia type: General  Patient location: PACU  Post pain: Pain level controlled  Post assessment: Post-op Vital signs reviewed  Last Vitals:  Filed Vitals:   06/29/12 1636  BP: 134/74  Pulse: 80  Temp: 36.2 C  Resp: 14    Post vital signs: Reviewed  Level of consciousness: sedated  Complications: No apparent anesthesia complications

## 2012-06-29 NOTE — Preoperative (Signed)
Beta Blockers   Reason not to administer Beta Blockers:Not Applicable, not on home BB 

## 2012-06-29 NOTE — H&P (Signed)
Kristy Arias  1950-08-27 [id number]  CARE TEAM:  PCP: Lucilla Edin, MD  Outpatient Care Team: Patient Care Team: Collene Gobble, MD as PCP - General (Family Medicine) Earl Many, MD as Referring Physician (Cardiology)  Inpatient Treatment Team: Treatment Team: Attending Provider: Ardeth Sportsman, MD   Reason for visit: Epigastric and upper abdominal pain with nausea and bloating. Gallstones.   Pleasant female. Has some occasional constipation and loose stools. Had an episode of explosive diarrhea after eating. Was found to have gallstones. Rest of workup otherwise negative.   Last week, had an episode of severe epigastric pain. She woke up with it. She had temperature up the night before. The pain and bloating disc persisted all day. She tried some Bentyl with not much help. She took if you are said and seemed to sleep better. However the pain persisted. She saw her primary care physician. There were some questionable EKG changes. She went to the emergency room. Workup was otherwise negative.   Because of the pain and gallstone, surgical consultation was requested. She notes she gets an occasional intermittent epigastric pain. She has had a couple episodes of left subcostal pain as well. No specific trigger with that. She has some occasional heartburn/reflux. It is mild. Prilosec and Zantac usually helps that. She takes TUMS rather regularly to help minimize osteoporosis. She occasionally gets loose bowel movements with red meat so she has changed her diet to more of a low-fat diet. Getting bloating more regularly now. She was concerned. She claims to have had a colonoscopy in done four years ago that was completely normal. No hematochezia or melena. Mild dysphagia to some solids but not liquids. Not worsening. Weight stable. No alcohol intake. No history of hepatitis or pancreatitis. No history of abdominal trauma or fall. No history of jaundice.  Cardiology clearance done - normal  coronary arteries & LVEDP.     Past Medical History  Diagnosis Date  . Anxiety   . Depression   . Glaucoma   . Hyperlipidemia   . Heart murmur   . Hypertension   . Hypothyroidism   . Syndrome X, cardiac   . Shortness of breath     syndrome x    Past Surgical History  Procedure Laterality Date  . Breast enhancement surgery    . Breast surgery    . Cosmetic surgery    . Dilation and curettage of uterus  2013  . Cardiac catheterization  4/14    History   Social History  . Marital Status: Married    Spouse Name: N/A    Number of Children: N/A  . Years of Education: N/A   Occupational History  . Not on file.   Social History Main Topics  . Smoking status: Never Smoker   . Smokeless tobacco: Never Used  . Alcohol Use: No  . Drug Use: No  . Sexually Active: Yes    Birth Control/ Protection: Surgical   Other Topics Concern  . Not on file   Social History Narrative  . No narrative on file    Family History  Problem Relation Age of Onset  . Hypertension Father   . Hypertension Maternal Grandmother   . Heart disease Maternal Grandmother   . Diabetes Maternal Grandfather   . Osteoporosis Paternal Grandmother   . Heart disease Paternal Grandfather     Current Facility-Administered Medications  Medication Dose Route Frequency Provider Last Rate Last Dose  . lactated ringers infusion   Intravenous  Continuous Gaetano Hawthorne, MD 100 mL/hr at 06/29/12 1247 1,000 mL at 06/29/12 1247     No Known Allergies    BP 144/78  Pulse 69  Temp(Src) 97.3 F (36.3 C) (Oral)  Resp 16  SpO2 97%  Review of Systems  Constitutional: Positive for fatigue. Negative for fever, chills, diaphoresis and appetite change.  HENT: Positive for sinus pressure. Negative for ear pain, sore throat, trouble swallowing, neck pain and ear discharge.  Eyes: Negative for photophobia, discharge and visual disturbance.  Respiratory: Negative for cough, choking, chest tightness and  shortness of breath.  Cardiovascular: Negative for chest pain, palpitations and leg swelling.  Exercises ZUMBA 45 minutes at a time, two times a week. Occasional shortness of breath after walking up two flights of stairs  Gastrointestinal: Positive for nausea, abdominal pain, diarrhea and constipation. Negative for vomiting, abdominal distention, anal bleeding and rectal pain.  Bowel movement every two days. Occasional bouts of diarrhea  Genitourinary: Positive for frequency. Negative for dysuria, urgency and difficulty urinating.  Musculoskeletal: Positive for arthralgias. Negative for myalgias and gait problem.  Skin: Negative for color change, pallor and rash.  Neurological: Negative for dizziness, speech difficulty, weakness and numbness.  Hematological: Negative for adenopathy.  Psychiatric/Behavioral: Positive for sleep disturbance. Negative for confusion and agitation. The patient is not nervous/anxious.  Objective:   Physical Exam  Constitutional: She is oriented to person, place, and time. She appears well-developed and well-nourished. No distress.  HENT:  Head: Normocephalic.  Mouth/Throat: Oropharynx is clear and moist. No oropharyngeal exudate.  Eyes: Conjunctivae and EOM are normal. Pupils are equal, round, and reactive to light. No scleral icterus.  Neck: Normal range of motion. Neck supple. No tracheal deviation present.  Cardiovascular: Normal rate, regular rhythm and intact distal pulses.  Pulmonary/Chest: Effort normal and breath sounds normal. No respiratory distress. She exhibits no tenderness.  Abdominal: Soft. She exhibits no distension, no pulsatile liver and no mass. There is tenderness in the right upper quadrant and epigastric area. There is no rigidity, no guarding, no CVA tenderness, no tenderness at McBurney's point and negative Murphy's sign. No hernia. Hernia confirmed negative in the right inguinal area and confirmed negative in the left inguinal area.   Genitourinary: No vaginal discharge found.  Musculoskeletal: Normal range of motion. She exhibits no tenderness.  Lymphadenopathy:  She has no cervical adenopathy.  Right: No inguinal adenopathy present.  Left: No inguinal adenopathy present.  Neurological: She is alert and oriented to person, place, and time. No cranial nerve deficit. She exhibits normal muscle tone. Coordination normal.  Skin: Skin is warm and dry. No rash noted. She is not diaphoretic. No erythema.  Psychiatric: She has a normal mood and affect. Her behavior is normal. Judgment and thought content normal.  Mildly anxious but consolable.       Results:   Labs: No results found for this or any previous visit (from the past 48 hour(s)).  Imaging / Studies: No results found.  Medications / Allergies: per chart  Antibiotics: Anti-infectives   None      Assessment  Anvika Otho Bellows  62 y.o. female  Day of Surgery  Procedure(s): LAPAROSCOPIC CHOLECYSTECTOMY SINGLE PORT IOC   Problem List:  Active Problems:   * No active hospital problems. *    Plan:  Biliary colic with negative cardiac workup.  I recommended surgery:  The anatomy & physiology of hepatobiliary & pancreatic function was discussed.  The pathophysiology of gallbladder dysfunction was discussed.  Natural history  risks without surgery was discussed.   I feel the risks of no intervention will lead to serious problems that outweigh the operative risks; therefore, I recommended cholecystectomy to remove the pathology.  I explained laparoscopic techniques with possible need for an open approach.  Probable cholangiogram to evaluate the bilary tract was explained as well.    Risks such as bleeding, infection, abscess, leak, injury to other organs, need for further treatment, heart attack, death, and other risks were discussed.  I noted a good likelihood this will help address the problem.  Possibility that this will not correct all abdominal symptoms  was explained.  Goals of post-operative recovery were discussed as well.  We will work to minimize complications.  An educational handout further explaining the pathology and treatment options was given as well.  Questions were answered.  The patient expresses understanding & wishes to proceed with surgery.   -VTE prophylaxis- SCDs, etc -mobilize as tolerated to help recovery    Ardeth Sportsman, M.D., F.A.C.S. Gastrointestinal and Minimally Invasive Surgery [facility] [address] [place], [place] [address] [phone] Main / Paging   06/29/2012

## 2012-06-29 NOTE — Anesthesia Preprocedure Evaluation (Signed)
Anesthesia Evaluation  Patient identified by MRN, date of birth, ID band Patient awake    Reviewed: Allergy & Precautions, H&P , NPO status , Patient's Chart, lab work & pertinent test results  Airway Mallampati: II TM Distance: >3 FB Neck ROM: Full    Dental  (+) Teeth Intact, Caps and Dental Advisory Given   Pulmonary shortness of breath and with exertion,  breath sounds clear to auscultation  Pulmonary exam normal       Cardiovascular hypertension, Pt. on medications Rhythm:Regular Rate:Normal  "Syndrome X" per cardiologist evaluation; cardiac cath. negative for CAD   Neuro/Psych negative neurological ROS  negative psych ROS   GI/Hepatic negative GI ROS, Neg liver ROS,   Endo/Other  Hypothyroidism   Renal/GU negative Renal ROS  negative genitourinary   Musculoskeletal negative musculoskeletal ROS (+)   Abdominal   Peds negative pediatric ROS (+)  Hematology negative hematology ROS (+)   Anesthesia Other Findings Multiple caps upper incisors.  Reproductive/Obstetrics negative OB ROS                           Anesthesia Physical Anesthesia Plan  ASA: II  Anesthesia Plan: General   Post-op Pain Management:    Induction: Intravenous  Airway Management Planned: Oral ETT  Additional Equipment:   Intra-op Plan:   Post-operative Plan: Extubation in OR  Informed Consent: I have reviewed the patients History and Physical, chart, labs and discussed the procedure including the risks, benefits and alternatives for the proposed anesthesia with the patient or authorized representative who has indicated his/her understanding and acceptance.   Dental advisory given  Plan Discussed with: CRNA  Anesthesia Plan Comments:         Anesthesia Quick Evaluation

## 2012-06-30 ENCOUNTER — Telehealth (INDEPENDENT_AMBULATORY_CARE_PROVIDER_SITE_OTHER): Payer: Self-pay | Admitting: General Surgery

## 2012-06-30 ENCOUNTER — Encounter (HOSPITAL_COMMUNITY): Payer: Self-pay | Admitting: Surgery

## 2012-06-30 NOTE — Telephone Encounter (Signed)
Post op appt made with patient. She will call with any questions prior. She will call back if she needs a return to work note prior to appt.

## 2012-07-05 ENCOUNTER — Encounter (INDEPENDENT_AMBULATORY_CARE_PROVIDER_SITE_OTHER): Payer: Self-pay

## 2012-07-13 ENCOUNTER — Other Ambulatory Visit: Payer: Self-pay | Admitting: Emergency Medicine

## 2012-07-18 ENCOUNTER — Ambulatory Visit (INDEPENDENT_AMBULATORY_CARE_PROVIDER_SITE_OTHER): Payer: 59 | Admitting: Surgery

## 2012-07-18 ENCOUNTER — Encounter (INDEPENDENT_AMBULATORY_CARE_PROVIDER_SITE_OTHER): Payer: Self-pay | Admitting: Surgery

## 2012-07-18 VITALS — BP 128/76 | HR 76 | Resp 16 | Ht 62.5 in | Wt 116.6 lb

## 2012-07-18 DIAGNOSIS — K8010 Calculus of gallbladder with chronic cholecystitis without obstruction: Secondary | ICD-10-CM

## 2012-07-18 NOTE — Progress Notes (Signed)
Subjective:     Patient ID: Kristy Arias, female   DOB: April 19, 1950, 62 y.o.   MRN: [id number]  HPI  Kristy Arias  10/20/1950 [id number]  Patient Care Team: Collene Gobble, MD as PCP - General (Family Medicine) Earl Many, MD as Referring Physician (Cardiology)  This patient is a 62 y.o.female who presents today for surgical evaluation Status post single site laparoscopic cholecystectomy 06/29/2012  Patient comes in today feeling okay overall.  Anxious.  Has been seeking to a low-fat diet.  Had some diarrhea with state and with a peanut butter sandwich.  Appetite coming back.  No nausea vomiting.  No attacks.  No fevers chills or sweats.No she should walk more but has not done it best to her satisfaction although she remains active.  No fevers or chills.  Still struggles with intermittent constipation and diarrhea.  Hesitant to try probiotics.  Not back on a fiber supplement yet.  Wonders if she go back on her vitamins that she was on last year.  Patient Active Problem List   Diagnosis Date Noted  . Chronic cholecystitis with calculus s/p lap chole 06/29/2012 04/13/2012  . Abdominal pain, epigastric 04/13/2012  . Heartburn 04/13/2012  . Hypertension 11/02/2011  . Fibromuscular dysplasia 11/02/2011  . Hyperlipidemia 11/02/2011  . Allergic rhinitis 11/02/2011  . Arthritis 11/02/2011  . Depression 11/02/2011    Past Medical History  Diagnosis Date  . Anxiety   . Depression   . Glaucoma   . Hyperlipidemia   . Heart murmur   . Hypertension   . Hypothyroidism   . Syndrome X, cardiac   . Shortness of breath     syndrome x    Past Surgical History  Procedure Laterality Date  . Breast enhancement surgery    . Breast surgery    . Cosmetic surgery    . Dilation and curettage of uterus  2013  . Cardiac catheterization  4/14  . Laparoscopic cholecystectomy single port N/A 06/29/2012    Procedure: LAPAROSCOPIC CHOLECYSTECTOMY SINGLE PORT IOC ;  Surgeon: Ardeth Sportsman, MD;   Location: [facility];  Service: General;  Laterality: N/A;    History   Social History  . Marital Status: Married    Spouse Name: N/A    Number of Children: N/A  . Years of Education: N/A   Occupational History  . Not on file.   Social History Main Topics  . Smoking status: Never Smoker   . Smokeless tobacco: Never Used  . Alcohol Use: No  . Drug Use: No  . Sexually Active: Yes    Birth Control/ Protection: Surgical   Other Topics Concern  . Not on file   Social History Narrative  . No narrative on file    Family History  Problem Relation Age of Onset  . Hypertension Father   . Hypertension Maternal Grandmother   . Heart disease Maternal Grandmother   . Diabetes Maternal Grandfather   . Osteoporosis Paternal Grandmother   . Heart disease Paternal Grandfather     Current Outpatient Prescriptions  Medication Sig Dispense Refill  . ALPRAZolam (XANAX) 0.5 MG tablet Take 0.5 mg by mouth 3 (three) times daily as needed for anxiety.      Marland Kitchen aspirin EC 81 MG tablet Take 81 mg by mouth daily.      Marland Kitchen aspirin-acetaminophen-caffeine (EXCEDRIN MIGRAINE) 250-250-65 MG per tablet Take 1-2 tablets by mouth 2 (two) times daily as needed (headache).      . butalbital-aspirin-caffeine-codeine [facility]  WITH CODEINE) 50-325-40-30 MG capsule Take 1 capsule by mouth every 4 (four) hours as needed for headache.      . calcium carbonate (TUMS EX) 750 MG chewable tablet Chew 1 tablet by mouth 2 (two) times daily.      . cetirizine (ZYRTEC) 10 MG tablet Take 10 mg by mouth daily.      . Chlorphen-Pyril-Phenyleph (RU-HIST FORTE PO) Take 1 tablet by mouth daily. For allergies      . Cholecalciferol (VITAMIN D3) 2000 UNITS TABS Take 2,000 Units by mouth daily.      . diclofenac (VOLTAREN) 75 MG EC tablet take 1 tablet by mouth twice a day  60 tablet  5  . dicyclomine (BENTYL) 20 MG tablet Take 20 mg by mouth 2 (two) times daily as needed (gallbladder issues).       . DULoxetine (CYMBALTA) 30 MG  capsule Take 30 mg by mouth at bedtime.       Marland Kitchen esomeprazole (NEXIUM) 40 MG capsule Take 40 mg by mouth 2 (two) times daily. Until after surgery      . estradiol (ESTRACE) 1 MG tablet Take 1 mg by mouth at bedtime.       Marland Kitchen levothyroxine (SYNTHROID, LEVOTHROID) 50 MCG tablet Take 50 mcg by mouth daily before breakfast.      . lisinopril (PRINIVIL,ZESTRIL) 10 MG tablet Take 10 mg by mouth every morning.      . mupirocin ointment (BACTROBAN) 2 %       . NITROSTAT 0.4 MG SL tablet       . nortriptyline (PAMELOR) 25 MG capsule Take 25 mg by mouth at bedtime as needed (sleep, migraine prevention).      Marland Kitchen oxyCODONE (OXY IR/ROXICODONE) 5 MG immediate release tablet Take 1-2 tablets (5-10 mg total) by mouth every 4 (four) hours as needed for pain.  40 tablet  0  . progesterone (PROMETRIUM) 200 MG capsule Take 200 mg by mouth See admin instructions. Takes QHS x 12 days every other month      . rosuvastatin (CRESTOR) 5 MG tablet Take 5 mg by mouth at bedtime.      . timolol (BETIMOL) 0.5 % ophthalmic solution Place 1 drop into both eyes 2 (two) times daily.      . Travoprost, BAK Free, (TRAVATAN) 0.004 % SOLN ophthalmic solution Place 1 drop into both eyes at bedtime.        No current facility-administered medications for this visit.     No Known Allergies  BP 128/76  Pulse 76  Resp 16  Ht 5' 2.5" (1.588 m)  Wt 116 lb 9.6 oz (52.889 kg)  BMI 20.97 kg/m2  Dg Cholangiogram Operative  06/29/2012   *RADIOLOGY REPORT*  Clinical Data: 62 year old female with cholecystitis undergoing cholecystectomy.  INTRAOPERATIVE CHOLANGIOGRAM  Technique:  Multiple fluoroscopic spot radiographs were obtained during intraoperative cholangiogram and are submitted for interpretation post-operatively.  Comparison: Abdomen ultrasound 03/29/2012.  Fluoroscopy time:  0 minutes and 10 seconds.  Findings: Surgical clips at the level of the cystic duct which has cannulated.  Contrast injection demonstrates prompt opacification of  the cystic duct, CBD, and prompt contrast emptying to the duodenum.  Visualized intrahepatic ducts are within normal limits. No filling defect or extravasation identified.  IMPRESSION: Negative intraoperative cholangiogram.   Original Report Authenticated By: Erskine Speed, M.D.     Review of Systems  Constitutional: Negative for fever, chills and diaphoresis.  HENT: Negative for ear pain, sore throat and trouble swallowing.  Eyes: Negative for photophobia and visual disturbance.  Respiratory: Negative for cough and choking.   Cardiovascular: Negative for chest pain and palpitations.  Gastrointestinal: Positive for diarrhea and constipation. Negative for nausea, vomiting, abdominal pain, blood in stool, abdominal distention, anal bleeding and rectal pain.  Genitourinary: Negative for dysuria, frequency and difficulty urinating.  Musculoskeletal: Negative for myalgias and gait problem.  Skin: Negative for color change, pallor and rash.  Neurological: Negative for dizziness, speech difficulty, weakness and numbness.  Hematological: Negative for adenopathy.  Psychiatric/Behavioral: Negative for confusion and agitation. The patient is nervous/anxious.        Objective:   Physical Exam  Constitutional: She is oriented to person, place, and time. She appears well-developed and well-nourished. No distress.  HENT:  Head: Normocephalic.  Mouth/Throat: Oropharynx is clear and moist. No oropharyngeal exudate.  Eyes: Conjunctivae and EOM are normal. Pupils are equal, round, and reactive to light. No scleral icterus.  Neck: Normal range of motion. No tracheal deviation present.  Cardiovascular: Normal rate and intact distal pulses.   Pulmonary/Chest: Effort normal. No respiratory distress. She exhibits no tenderness.  Abdominal: Soft. She exhibits no distension. There is no tenderness. Hernia confirmed negative in the right inguinal area and confirmed negative in the left inguinal area.  Incisions  clean with normal healing ridges.  No hernias  Genitourinary: No vaginal discharge found.  Musculoskeletal: Normal range of motion. She exhibits no tenderness.  Lymphadenopathy:       Right: No inguinal adenopathy present.       Left: No inguinal adenopathy present.  Neurological: She is alert and oriented to person, place, and time. No cranial nerve deficit. She exhibits normal muscle tone. Coordination normal.  Skin: Skin is warm and dry. No rash noted. She is not diaphoretic.  Psychiatric: She has a normal mood and affect. Her behavior is normal.       Assessment:     2 weeks s/p lap chole, recovering relatively well     Plan:     I tried to reassure her that I think she is doing relatively well considering.  If her health is in pretty good shape right now.  Eventually she seemed somewhat reassured.  Increase activity as tolerated to regular activity.  Low impact exercise such as walking an hour a day at least ideal.  Do not push through pain.  Diet as tolerated.  Low fat high fiber diet ideal.  Bowel regimen with 30 g fiber a day and fiber supplement as needed to avoid problems.  Consider flaxseed her MiraLAX and having very gently as I suspect there is an IBS componentConsider retrying a probiotic.  She swears by a liquid multivitamin Laster help regulate her bowels.  Okay to try that as long as her primary care physician is aware.  Return to clinic as needed.   Instructions discussed.  Followup with primary care physician for other health issues as would normally be done.  Questions answered.  The patient expressed understanding and appreciation

## 2012-07-18 NOTE — Patient Instructions (Addendum)
LAPAROSCOPIC SURGERY: POST OP INSTRUCTIONS  1. DIET: Follow a light bland diet the first 24 hours after arrival home, such as soup, liquids, crackers, etc.  Be sure to include lots of fluids daily.  Avoid fast food or heavy meals as your are more likely to get nauseated.  Eat a low fat the next few days after surgery.   2. Take your usually prescribed home medications unless otherwise directed. 3. PAIN CONTROL: a. Pain is best controlled by a usual combination of three different methods TOGETHER: i. Ice/Heat ii. Over the counter pain medication iii. Prescription pain medication b. Most patients will experience some swelling and bruising around the incisions.  Ice packs or heating pads (30-60 minutes up to 6 times a day) will help. Use ice for the first few days to help decrease swelling and bruising, then switch to heat to help relax tight/sore spots and speed recovery.  Some people prefer to use ice alone, heat alone, alternating between ice & heat.  Experiment to what works for you.  Swelling and bruising can take several weeks to resolve.   c. It is helpful to take an over-the-counter pain medication regularly for the first few weeks.  Choose one of the following that works best for you: i. Naproxen (Aleve, etc)  Two 220mg tabs twice a day ii. Ibuprofen (Advil, etc) Three 200mg tabs four times a day (every meal & bedtime) iii. Acetaminophen (Tylenol, etc) 500-650mg four times a day (every meal & bedtime) d. A  prescription for pain medication (such as oxycodone, hydrocodone, etc) should be given to you upon discharge.  Take your pain medication as prescribed.  i. If you are having problems/concerns with the prescription medicine (does not control pain, nausea, vomiting, rash, itching, etc), please call us [phone] to see if we need to switch you to a different pain medicine that will work better for you and/or control your side effect better. ii. If you need a refill on your pain medication,  please contact your pharmacy.  They will contact our office to request authorization. Prescriptions will not be filled after 5 pm or on week-ends. 4. Avoid getting constipated.  Between the surgery and the pain medications, it is common to experience some constipation.  Increasing fluid intake and taking a fiber supplement (such as Metamucil, Citrucel, FiberCon, MiraLax, etc) 1-2 times a day regularly will usually help prevent this problem from occurring.  A mild laxative (prune juice, Milk of Magnesia, MiraLax, etc) should be taken according to package directions if there are no bowel movements after 48 hours.   5. Watch out for diarrhea.  If you have many loose bowel movements, simplify your diet to bland foods & liquids for a few days.  Stop any stool softeners and decrease your fiber supplement.  Switching to mild anti-diarrheal medications (Kayopectate, Pepto Bismol) can help.  If this worsens or does not improve, please call us. 6. Wash / shower every day.  You may shower over the dressings as they are waterproof.  Continue to shower over incision(s) after the dressing is off. 7. Remove your waterproof bandages 5 days after surgery.  You may leave the incision open to air.  You may replace a dressing/Band-Aid to cover the incision for comfort if you wish.  8. ACTIVITIES as tolerated:   a. You may resume regular (light) daily activities beginning the next day-such as daily self-care, walking, climbing stairs-gradually increasing activities as tolerated.  If you can walk 30 minutes without difficulty, it   is safe to try more intense activity such as jogging, treadmill, bicycling, low-impact aerobics, swimming, etc. b. Save the most intensive and strenuous activity for last such as sit-ups, heavy lifting, contact sports, etc  Refrain from any heavy lifting or straining until you are off narcotics for pain control.   c. DO NOT PUSH THROUGH PAIN.  Let pain be your guide: If it hurts to do something, don't do  it.  Pain is your body warning you to avoid that activity for another week until the pain goes down. d. You may drive when you are no longer taking prescription pain medication, you can comfortably wear a seatbelt, and you can safely maneuver your car and apply brakes. e. You may have sexual intercourse when it is comfortable.  9. FOLLOW UP in our office a. Please call [facility] at [phone] to set up an appointment to see your surgeon in the office for a follow-up appointment approximately 2-3 weeks after your surgery. b. Make sure that you call for this appointment the day you arrive home to insure a convenient appointment time. 10. IF YOU HAVE DISABILITY OR FAMILY LEAVE FORMS, BRING THEM TO THE OFFICE FOR PROCESSING.  DO NOT GIVE THEM TO YOUR DOCTOR.   WHEN TO CALL US [phone]: 1. Poor pain control 2. Reactions / problems with new medications (rash/itching, nausea, etc)  3. Fever over 101.5 F (38.5 C) 4. Inability to urinate 5. Nausea and/or vomiting 6. Worsening swelling or bruising 7. Continued bleeding from incision. 8. Increased pain, redness, or drainage from the incision   The clinic staff is available to answer your questions during regular business hours (8:30am-5pm).  Please don't hesitate to call and ask to speak to one of our nurses for clinical concerns.   If you have a medical emergency, go to the nearest emergency room or call 911.  A surgeon from [facility] is always on call at the hospitals   [facility] [address], [address], [place], [place]  [address] ? MAIN: [phone] ? TOLL FREE: [phone] ?  FAX [phone] [email or web address]  GETTING TO GOOD BOWEL HEALTH. Irregular bowel habits such as constipation and diarrhea can lead to many problems over time.  Having one soft bowel movement a day is the most important way to prevent further problems.  The anorectal canal is designed to handle stretching  and feces to safely manage our ability to get rid of solid waste (feces, poop, stool) out of our body.  BUT, hard constipated stools can act like ripping concrete bricks and diarrhea can be a burning fire to this very sensitive area of our body, causing inflamed hemorrhoids, anal fissures, increasing risk is perirectal abscesses, abdominal pain/bloating, an making irritable bowel worse.     The goal: ONE SOFT BOWEL MOVEMENT A DAY!  To have soft, regular bowel movements:    Drink at least 8 tall glasses of water a day.     Take plenty of fiber.  Fiber is the undigested part of plant food that passes into the colon, acting s "natures broom" to encourage bowel motility and movement.  Fiber can absorb and hold large amounts of water. This results in a larger, bulkier stool, which is soft and easier to pass. Work gradually over several weeks up to 6 servings a day of fiber (25g a day even more if needed) in the form of: o Vegetables -- Root (potatoes, carrots, turnips), leafy green (lettuce, salad greens, celery,   spinach), or cooked high residue (cabbage, broccoli, etc) o Fruit -- Fresh (unpeeled skin & pulp), Dried (prunes, apricots, cherries, etc ),  or stewed ( applesauce)  o Whole grain breads, pasta, etc (whole wheat)  o Bran cereals    Bulking Agents -- This type of water-retaining fiber generally is easily obtained each day by one of the following:  o Psyllium bran -- The psyllium plant is remarkable because its ground seeds can retain so much water. This product is available as Metamucil, Konsyl, Effersyllium, Per Diem Fiber, or the less expensive generic preparation in drug and health food stores. Although labeled a laxative, it really is not a laxative.  o Methylcellulose -- This is another fiber derived from wood which also retains water. It is available as Citrucel. o Polyethylene Glycol - and "artificial" fiber commonly called Miralax or Glycolax.  It is helpful for people with gassy or bloated  feelings with regular fiber o Flax Seed - a less gassy fiber than psyllium   No reading or other relaxing activity while on the toilet. If bowel movements take longer than 5 minutes, you are too constipated   AVOID CONSTIPATION.  High fiber and water intake usually takes care of this.  Sometimes a laxative is needed to stimulate more frequent bowel movements, but    Laxatives are not a good long-term solution as it can wear the colon out. o Osmotics (Milk of Magnesia, Fleets phosphosoda, Magnesium citrate, MiraLax, GoLytely) are safer than  o Stimulants (Senokot, Castor Oil, Dulcolax, Ex Lax)    o Do not take laxatives for more than 7days in a row.    IF SEVERELY CONSTIPATED, try a Bowel Retraining Program: o Do not use laxatives.  o Eat a diet high in roughage, such as bran cereals and leafy vegetables.  o Drink six (6) ounces of prune or apricot juice each morning.  o Eat two (2) large servings of stewed fruit each day.  o Take one (1) heaping tablespoon of a psyllium-based bulking agent twice a day. Use sugar-free sweetener when possible to avoid excessive calories.  o Eat a normal breakfast.  o Set aside 15 minutes after breakfast to sit on the toilet, but do not strain to have a bowel movement.  o If you do not have a bowel movement by the third day, use an enema and repeat the above steps.    Controlling diarrhea o Switch to liquids and simpler foods for a few days to avoid stressing your intestines further. o Avoid dairy products (especially milk & ice cream) for a short time.  The intestines often can lose the ability to digest lactose when stressed. o Avoid foods that cause gassiness or bloating.  Typical foods include beans and other legumes, cabbage, broccoli, and dairy foods.  Every person has some sensitivity to other foods, so listen to our body and avoid those foods that trigger problems for you. o Adding fiber (Citrucel, Metamucil, psyllium, Miralax) gradually can help thicken  stools by absorbing excess fluid and retrain the intestines to act more normally.  Slowly increase the dose over a few weeks.  Too much fiber too soon can backfire and cause cramping & bloating. o Probiotics (such as active yogurt, Align, etc) may help repopulate the intestines and colon with normal bacteria and calm down a sensitive digestive tract.  Most studies show it to be of mild help, though, and such products can be costly. o Medicines:   Bismuth subsalicylate (ex. Kayopectate, Pepto Bismol) every   30 minutes for up to 6 doses can help control diarrhea.  Avoid if pregnant.   Loperamide (Immodium) can slow down diarrhea.  Start with two tablets (4mg total) first and then try one tablet every 6 hours.  Avoid if you are having fevers or severe pain.  If you are not better or start feeling worse, stop all medicines and call your doctor for advice o Call your doctor if you are getting worse or not better.  Sometimes further testing (cultures, endoscopy, X-ray studies, bloodwork, etc) may be needed to help diagnose and treat the cause of the diarrhea.  

## 2012-09-18 ENCOUNTER — Telehealth: Payer: Self-pay

## 2012-09-18 ENCOUNTER — Other Ambulatory Visit: Payer: Self-pay | Admitting: Physician Assistant

## 2012-09-18 MED ORDER — LISINOPRIL 10 MG PO TABS: 10.0000 mg | ORAL_TABLET | Freq: Every morning | ORAL | Status: DC

## 2012-09-18 NOTE — Telephone Encounter (Signed)
PT STATES THE PHARMACY TRIED CALLING REGARDING HER LISINOPRIL AND WAS TOLD SHE NEEDED AN OV. HAVE AN APPT IN October SO IN THE MEANTIME WOULD LIKE TO HAVE ENOUGH UNTIL THEN. PLEASE CALL [phone]    [facility] ON [address]

## 2012-09-18 NOTE — Telephone Encounter (Signed)
Sent in

## 2012-10-07 ENCOUNTER — Ambulatory Visit (INDEPENDENT_AMBULATORY_CARE_PROVIDER_SITE_OTHER): Payer: 59 | Admitting: Emergency Medicine

## 2012-10-07 VITALS — BP 100/65 | HR 70 | Temp 98.2°F | Resp 16 | Ht 63.0 in | Wt 111.0 lb

## 2012-10-07 DIAGNOSIS — R110 Nausea: Secondary | ICD-10-CM

## 2012-10-07 DIAGNOSIS — R197 Diarrhea, unspecified: Secondary | ICD-10-CM

## 2012-10-07 DIAGNOSIS — R109 Unspecified abdominal pain: Secondary | ICD-10-CM

## 2012-10-07 LAB — COMPREHENSIVE METABOLIC PANEL
ALT: 15 U/L (ref 0–35)
AST: 15 U/L (ref 0–37)
Albumin: 3.8 g/dL (ref 3.5–5.2)
Alkaline Phosphatase: 70 U/L (ref 39–117)
Glucose, Bld: 97 mg/dL (ref 70–99)
Potassium: 5 mEq/L (ref 3.5–5.3)
Sodium: 138 mEq/L (ref 135–145)
Total Protein: 6.5 g/dL (ref 6.0–8.3)

## 2012-10-07 LAB — POCT CBC
Granulocyte percent: 53.2 %G (ref 37–80)
HCT, POC: 35.8 % — AB (ref 37.7–47.9)
MPV: 8.4 fL (ref 0–99.8)
POC Granulocyte: 4.2 (ref 2–6.9)
POC LYMPH PERCENT: 39.8 %L (ref 10–50)
Platelet Count, POC: 409 10*3/uL (ref 142–424)
RDW, POC: 13.4 %

## 2012-10-07 MED ORDER — CHOLESTYRAMINE 4 G PO PACK: PACK | ORAL | Status: DC

## 2012-10-07 NOTE — Progress Notes (Signed)
  Subjective:    Patient ID: Kristy Arias, female    DOB: July 12, 1950, 62 y.o.   MRN: [id number]  HPIPatient recently had gallbladder taken out, still can't eat what she wants, heartburn , diarrhea. Patient taking Nexium started 40 mg went down to 20 mg now heartburn increasing. Went to yoga had symptoms of nausea and heartburn never had this experience with exercise. Vomitting and chills after exercise went back up to 40mg . Patient also noticed change in blood pressure and she lowered her dose of lisinopril to 5 mg, then went to 5 mg every other day. Talked with patient about having an endoscopy. Patient also having trouble with insomnia, takes 1 mg of xanax each night. Patient thinks its related to her progesterone.    Review of Systems     Objective:   Physical Exam patient does not look acutely ill. She is alert and cooperative. Her neck is supple. Chest was clear. Regular rate no murmurs. The abdomen is soft. There are no areas of tenderness or masses are felt.  Results for orders placed in visit on 10/07/12  POCT CBC      Result Value Range   WBC 7.9  4.6 - 10.2 K/uL   Lymph, poc 3.1  0.6 - 3.4   POC LYMPH PERCENT 39.8  10 - 50 %L   MID (cbc) 0.6  0 - 0.9   POC MID % 7.0  0 - 12 %M   POC Granulocyte 4.2  2 - 6.9   Granulocyte percent 53.2  37 - 80 %G   RBC 3.61 (*) 4.04 - 5.48 M/uL   Hemoglobin 11.0 (*) 12.2 - 16.2 g/dL   HCT, POC 95.6 (*) 21.3 - 47.9 %   MCV 99.2 (*) 80 - 97 fL   MCH, POC 30.5  27 - 31.2 pg   MCHC 30.7 (*) 31.8 - 35.4 g/dL   RDW, POC 08.6     Platelet Count, POC 409  142 - 424 K/uL   MPV 8.4  0 - 99.8 fL        Assessment & Plan:  The patient will need endoscopy. She will need to be evaluated for her diarrhea which is most likely related to her cholecystectomy. She has been on chronic PPI therapy and needs to be screen for Barrett's esophagus. She is borderline anemic with an MCV of 99. And this also will need to be followed .

## 2012-10-08 ENCOUNTER — Other Ambulatory Visit: Payer: Self-pay

## 2012-10-08 MED ORDER — AMLODIPINE BESYLATE 2.5 MG PO TABS: 2.5000 mg | ORAL_TABLET | Freq: Every day | ORAL | Status: DC

## 2012-10-09 LAB — GLIA (IGA/G) + TTG IGA: Gliadin IgA: 3.6 U/mL (ref <20)

## 2012-11-07 ENCOUNTER — Other Ambulatory Visit: Payer: Self-pay | Admitting: Emergency Medicine

## 2012-11-10 ENCOUNTER — Other Ambulatory Visit: Payer: Self-pay | Admitting: Emergency Medicine

## 2012-11-27 ENCOUNTER — Telehealth: Payer: Self-pay

## 2012-11-27 NOTE — Telephone Encounter (Signed)
Pt.notified

## 2012-11-27 NOTE — Telephone Encounter (Signed)
Pt is very concerned about blood pressure issues. She cant get into see dr Cleta Alberts on any of the days he is here, until the latter part of the week. Pt would like to speak with someone regarding blood pressure and referrals that are to be made for a cardiologist referral and a referral for a endoscopy.

## 2012-11-27 NOTE — Telephone Encounter (Signed)
Increase the Norvasc to 5 mg a day have her take her blood pressure daily see me in about a week

## 2012-11-27 NOTE — Telephone Encounter (Signed)
Patients BP meds were changed in sept. Has been 140/85 went to OB Gyn 148/100 this morning. She is trying to come in next week. She wants to know if you can adjust her meds. She was here in Sept and her BP was low at visit then, but she had a GI bug at the time. She is currently on Norvasc 2.5 mg daily

## 2012-11-27 NOTE — Telephone Encounter (Signed)
What are her BP issues? Left message for her to call me back.

## 2012-12-04 ENCOUNTER — Ambulatory Visit: Payer: 59

## 2012-12-04 ENCOUNTER — Ambulatory Visit (INDEPENDENT_AMBULATORY_CARE_PROVIDER_SITE_OTHER): Payer: 59 | Admitting: Emergency Medicine

## 2012-12-04 ENCOUNTER — Encounter: Payer: Self-pay | Admitting: Emergency Medicine

## 2012-12-04 VITALS — BP 132/78 | HR 74 | Temp 97.7°F | Resp 16 | Ht 62.0 in | Wt 111.0 lb

## 2012-12-04 DIAGNOSIS — E039 Hypothyroidism, unspecified: Secondary | ICD-10-CM

## 2012-12-04 DIAGNOSIS — E785 Hyperlipidemia, unspecified: Secondary | ICD-10-CM

## 2012-12-04 DIAGNOSIS — M25579 Pain in unspecified ankle and joints of unspecified foot: Secondary | ICD-10-CM

## 2012-12-04 DIAGNOSIS — G4700 Insomnia, unspecified: Secondary | ICD-10-CM

## 2012-12-04 DIAGNOSIS — Z0000 Encounter for general adult medical examination without abnormal findings: Secondary | ICD-10-CM

## 2012-12-04 DIAGNOSIS — I10 Essential (primary) hypertension: Secondary | ICD-10-CM

## 2012-12-04 DIAGNOSIS — M1990 Unspecified osteoarthritis, unspecified site: Secondary | ICD-10-CM

## 2012-12-04 LAB — POCT URINALYSIS DIPSTICK
Glucose, UA: NEGATIVE
Leukocytes, UA: NEGATIVE
Nitrite, UA: NEGATIVE

## 2012-12-04 LAB — LIPID PANEL
Cholesterol: 174 mg/dL (ref 0–200)
HDL: 67 mg/dL (ref >39)
LDL Cholesterol: 61 mg/dL (ref 0–99)
Triglycerides: 228 mg/dL — ABNORMAL HIGH (ref <150)

## 2012-12-04 LAB — COMPREHENSIVE METABOLIC PANEL
Albumin: 3.7 g/dL (ref 3.5–5.2)
Alkaline Phosphatase: 77 U/L (ref 39–117)
BUN: 13 mg/dL (ref 6–23)
CO2: 27 mEq/L (ref 19–32)
Glucose, Bld: 97 mg/dL (ref 70–99)
Potassium: 4.8 mEq/L (ref 3.5–5.3)
Total Bilirubin: 0.3 mg/dL (ref 0.3–1.2)

## 2012-12-04 LAB — POCT CBC
HCT, POC: 33.1 % — AB (ref 37.7–47.9)
Hemoglobin: 9.7 g/dL — AB (ref 12.2–16.2)
Lymph, poc: 1.9 (ref 0.6–3.4)
MCHC: 29.3 g/dL — AB (ref 31.8–35.4)
MCV: 93.9 fL (ref 80–97)
POC LYMPH PERCENT: 24.6 %L (ref 10–50)
RDW, POC: 13.3 %

## 2012-12-04 LAB — RHEUMATOID FACTOR: Rhuematoid fact SerPl-aCnc: <10 IU/mL (ref <=14)

## 2012-12-04 MED ORDER — DULOXETINE HCL 30 MG PO CPEP: 30.0000 mg | ORAL_CAPSULE | Freq: Every day | ORAL | Status: DC

## 2012-12-04 MED ORDER — LOSARTAN POTASSIUM 25 MG PO TABS: 25.0000 mg | ORAL_TABLET | Freq: Every day | ORAL | Status: DC

## 2012-12-04 MED ORDER — AMLODIPINE BESYLATE 2.5 MG PO TABS: ORAL_TABLET | ORAL | Status: DC

## 2012-12-04 MED ORDER — NORTRIPTYLINE HCL 25 MG PO CAPS: 25.0000 mg | ORAL_CAPSULE | Freq: Every evening | ORAL | Status: DC | PRN

## 2012-12-04 MED ORDER — DICLOFENAC SODIUM 75 MG PO TBEC: DELAYED_RELEASE_TABLET | ORAL | Status: DC

## 2012-12-04 MED ORDER — LEVOTHYROXINE SODIUM 50 MCG PO TABS: ORAL_TABLET | ORAL | Status: DC

## 2012-12-04 MED ORDER — ROSUVASTATIN CALCIUM 5 MG PO TABS: 5.0000 mg | ORAL_TABLET | Freq: Every day | ORAL | Status: DC

## 2012-12-04 NOTE — Progress Notes (Addendum)
Subjective:    Patient ID: Kristy Arias, female    DOB: August 12, 1950, 62 y.o.   MRN: [id number] This chart was scribed for Kristy Chris, MD by Valera Castle, ED Scribe. This patient was seen in room 9and the patient's care was started at 8:29 AM.  HPI Kristy Arias is a 62 y.o. female who presents to the [facility] for an annual exam and also complains of HTN. She reports visiting here in 09/2012, and states that Dr. Cleta Alberts had changed her blood pressure medicine. She states that in the morning her BP is usually fine, but that in the evening it worsens. She reports checking her BP regularly. 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 She states she is okay with her work, and thinks that she may be able to reduce her hours per week even more. She states this is a busy time of the year for work.   She reports allergy symptoms in the last month, lasting about a week. She reports that she is over those symptoms now. She is agreeable to trying Flonase once a day for her post nasal drip.   She reports seeing her GYN last week. She states she is on Prometrium, but still has spots. She reports she has also been on Esterase, and has doubled the dosage last week.   She reports h/o joint pain and back pain. She reports ankle swelling, worse in the right. She reports that the way she sits at work might be exacerbating the ankle swelling. She reports a h/o of 2 falls.   GI - Dr. Kinnie Scales  She reports h/o cholecystectomy.   Patient Active Problem List   Diagnosis Date Noted  . Chronic cholecystitis with calculus s/p lap chole 06/29/2012 04/13/2012  . Abdominal pain, epigastric 04/13/2012  . Heartburn 04/13/2012  . Hypertension 11/02/2011  . Fibromuscular dysplasia 11/02/2011  . Hyperlipidemia 11/02/2011  . Allergic rhinitis 11/02/2011  . Arthritis 11/02/2011  . Depression 11/02/2011   Past Medical History  Diagnosis Date  . Anxiety   . Depression   . Glaucoma   .  Hyperlipidemia   . Heart murmur   . Hypertension   . Hypothyroidism   . Syndrome X, cardiac   . Shortness of breath     syndrome x  . Arthritis   . GERD (gastroesophageal reflux disease)    Past Surgical History  Procedure Laterality Date  . Breast enhancement surgery    . Breast surgery    . Cosmetic surgery    . Dilation and curettage of uterus  2013  . Cardiac catheterization  4/14  . Laparoscopic cholecystectomy single port N/A 06/29/2012    Procedure: LAPAROSCOPIC CHOLECYSTECTOMY SINGLE PORT IOC ;  Surgeon: Ardeth Sportsman, MD;  Location: [facility];  Service: General;  Laterality: N/A;  . Cholecystectomy     No Known Allergies Prior to Admission medications   Medication Sig Start Date End Date Taking? Authorizing Provider  ALPRAZolam Prudy Feeler) 0.5 MG tablet Take 0.5 mg by mouth 3 (three) times daily as needed for anxiety.   Yes Historical Provider, MD  amLODipine (NORVASC) 2.5 MG tablet take 1 tablet by mouth once daily 11/07/12  Yes Eleanore E Egan, PA-C  aspirin EC 81 MG tablet Take 81 mg by mouth daily.   Yes Historical Provider, MD  aspirin-acetaminophen-caffeine (EXCEDRIN MIGRAINE) [phone] MG per tablet Take 1-2 tablets by mouth 2 (two) times daily as needed (headache).   Yes  Historical Provider, MD  butalbital-aspirin-caffeine-codeine Benny Lennert WITH CODEINE) 50-325-40-30 MG capsule Take 1 capsule by mouth every 4 (four) hours as needed for headache.   Yes Historical Provider, MD  calcium carbonate (TUMS EX) 750 MG chewable tablet Chew 1 tablet by mouth 2 (two) times daily.   Yes Historical Provider, MD  cetirizine (ZYRTEC) 10 MG tablet Take 10 mg by mouth daily.   Yes Historical Provider, MD  Chlorphen-Pyril-Phenyleph (RU-HIST FORTE PO) Take 1 tablet by mouth daily. For allergies   Yes Historical Provider, MD  Cholecalciferol (VITAMIN D3) 2000 UNITS TABS Take 2,000 Units by mouth daily.   Yes Historical Provider, MD  cholestyramine Lanetta Inch) 4 G packet Take one packet daily  for diarrhea. 10/07/12  Yes Collene Gobble, MD  diclofenac (VOLTAREN) 75 MG EC tablet take 1 tablet by mouth twice a day 07/13/12  Yes Heather M Marte, PA-C  dicyclomine (BENTYL) 20 MG tablet Take 20 mg by mouth 2 (two) times daily as needed (gallbladder issues).  03/23/12  Yes Collene Gobble, MD  DULoxetine (CYMBALTA) 30 MG capsule Take 30 mg by mouth at bedtime.    Yes Historical Provider, MD  esomeprazole (NEXIUM) 40 MG capsule Take 40 mg by mouth 2 (two) times daily. Until after surgery   Yes Historical Provider, MD  estradiol (ESTRACE) 1 MG tablet Take 1 mg by mouth at bedtime.    Yes Historical Provider, MD  levothyroxine (SYNTHROID, LEVOTHROID) 50 MCG tablet Take 1 tablet (50 mcg total) by mouth daily before breakfast. PATIENT NEEDS OFFICE VISIT/LABS FOR ADDITIONAL REFILLS 11/10/12  Yes Collene Gobble, MD  nortriptyline (PAMELOR) 25 MG capsule Take 25 mg by mouth at bedtime as needed (sleep, migraine prevention).   Yes Historical Provider, MD  progesterone (PROMETRIUM) 200 MG capsule Take 200 mg by mouth See admin instructions. Takes QHS x 12 days every other month   Yes Historical Provider, MD  rosuvastatin (CRESTOR) 5 MG tablet Take 5 mg by mouth at bedtime.   Yes Historical Provider, MD  timolol (BETIMOL) 0.5 % ophthalmic solution Place 1 drop into both eyes 2 (two) times daily.   Yes Historical Provider, MD  Travoprost, BAK Free, (TRAVATAN) 0.004 % SOLN ophthalmic solution Place 1 drop into both eyes at bedtime.    Yes Historical Provider, MD  NITROSTAT 0.4 MG SL tablet  05/17/12   Historical Provider, MD    Review of Systems  HENT: Positive for postnasal drip.   Musculoskeletal: Positive for arthralgias, back pain and joint swelling (bilateral ankles).  Psychiatric/Behavioral: Negative for dysphoric mood. The patient is not nervous/anxious.        Objective:   Physical Exam Nursing note and vitals reviewed. Constitutional: Pt is oriented to person, place, and time. Pt appears  well-developed and well-nourished. No distress.  HENT:  Head: Normocephalic and atraumatic.  Left Ear: Tympanic membrane, external ear and ear canal normal.  Mouth/Throat: Oropharynx is clear and moist.  Eyes: EOM are normal.  Neck: Neck supple. No tracheal deviation present.  Cardiovascular: Effort normal. There is a 2/6 systolic murmur at the lateral border which radiates into the carotids. Pulmonary/Chest: Breath sounds normal. No respiratory distress. She has no wheezes. She has no rales.  Abdominal: Soft. Bowel sounds are normal. There is no tenderness. There is no rebound and no guarding.  Musculoskeletal: Normal range of motion.  Neurological: Pt is alert and oriented to person, place, and time.  Skin: Skin is warm and dry.  Psychiatric: Pt has a normal mood and  affect. Pt's behavior is normal.  [facility] reading (PRIMARY) by  Dr. Cleta Alberts patient has bilateral breast implants. There is a moderate thoracic scoliosis. Lung fields are clear. Examination of both ankles are normal     BP 132/78  Pulse 74  Temp(Src) 97.7 F (36.5 C) (Oral)  Resp 16  Ht 5\' 2"  (1.575 m)  Wt 111 lb (50.349 kg)  BMI 20.30 kg/m2  SpO2 100%     Assessment & Plan:   Referrals made to Dr. Verl Dicker office. Referral also made to Dr. Kinnie Scales. We'll add losartan 25 mg and decrease in her amlodipine to 2.5 a day. She will take her blood pressure twice a day and record. Her creatinine was at 1.4 when her ACE inhibitor was stopped. We'll need to check her blood work this visit and then repeat in one month to make sure the addition of losartan and does not affect her renal function

## 2012-12-05 ENCOUNTER — Other Ambulatory Visit: Payer: Self-pay | Admitting: Radiology

## 2012-12-05 DIAGNOSIS — Z1389 Encounter for screening for other disorder: Secondary | ICD-10-CM

## 2012-12-05 MED ORDER — NORTRIPTYLINE HCL 50 MG PO CAPS: 50.0000 mg | ORAL_CAPSULE | Freq: Every day | ORAL | Status: DC

## 2012-12-05 NOTE — Telephone Encounter (Signed)
Spoke to her and she indicates you were going to increase the pamelor, but it was sent in at old dose. Pended 50 mg, please advise, she will return in 6-8 weeks for the BMET , this is placed as a future order.

## 2012-12-05 NOTE — Telephone Encounter (Signed)
Message copied by Caffie Damme on Tue Dec 05, 2012 10:34 AM ------      Message from: Lesle Chris A      Created: Tue Dec 05, 2012  7:36 AM       [facility]. These labs to Dr. Lewis Moccasin. Call patient and let her known her triglycerides are slightly elevated no change in medications. Her test for rheumatoid arthritis was negative. She needs to come by for a repeat basic metabolic panel to check her kidney function to be sure her creatinine does not rise when she goes on the losartan. ------

## 2012-12-05 NOTE — Telephone Encounter (Signed)
Left message for her to call me back about labs

## 2012-12-13 ENCOUNTER — Telehealth: Payer: Self-pay

## 2012-12-13 NOTE — Telephone Encounter (Signed)
LOIS FROM THE [facility] STATES PT IS COMING IN FOR SURGERY ON Friday AND THEY WANT TO MAKE SURE ABOUT LABS/EKGS OR ANYTHING ELSE PLEASE FAX TO [phone] AND THE PHONE NUMBER IS [phone] [phone]

## 2012-12-13 NOTE — Telephone Encounter (Signed)
PATIENTS LAST PE FAXED TO SUR[facility]ICAL C[facility] TODAY.

## 2012-12-15 ENCOUNTER — Other Ambulatory Visit: Payer: Self-pay | Admitting: Obstetrics & Gynecology

## 2013-07-25 ENCOUNTER — Other Ambulatory Visit: Payer: Self-pay | Admitting: Obstetrics & Gynecology

## 2013-08-06 ENCOUNTER — Other Ambulatory Visit: Payer: Self-pay | Admitting: Emergency Medicine

## 2013-09-17 ENCOUNTER — Telehealth: Payer: Self-pay

## 2013-09-17 MED ORDER — NORTRIPTYLINE HCL 50 MG PO CAPS: 50.0000 mg | ORAL_CAPSULE | Freq: Every day | ORAL | Status: DC

## 2013-09-17 NOTE — Telephone Encounter (Signed)
Pt will be in October for CPE- insurance requires a 90 day supply.  Please advise.

## 2013-09-17 NOTE — Telephone Encounter (Signed)
Okay to refill all medicines for 90 days

## 2013-09-17 NOTE — Telephone Encounter (Signed)
Pt states she have been trying since June to get a refill on her pamelor s and they still hasn't heard from anyone Please call pt at [phone]    [employer]

## 2013-09-20 MED ORDER — NORTRIPTYLINE HCL 50 MG PO CAPS: 50.0000 mg | ORAL_CAPSULE | Freq: Every day | ORAL | Status: DC

## 2013-09-20 NOTE — Telephone Encounter (Signed)
Sent in RF and notified pt. 

## 2013-09-20 NOTE — Addendum Note (Signed)
Addended by: Sheppard Plumber A on: 09/20/2013 01:09 PM   Modules accepted: Orders

## 2013-09-26 ENCOUNTER — Encounter: Payer: Self-pay | Admitting: Emergency Medicine

## 2013-11-20 ENCOUNTER — Other Ambulatory Visit: Payer: Self-pay | Admitting: Emergency Medicine

## 2013-12-04 ENCOUNTER — Other Ambulatory Visit: Payer: Self-pay | Admitting: Obstetrics & Gynecology

## 2013-12-05 LAB — CYTOLOGY - PAP

## 2013-12-21 ENCOUNTER — Other Ambulatory Visit: Payer: Self-pay | Admitting: Physician Assistant

## 2013-12-22 ENCOUNTER — Ambulatory Visit (INDEPENDENT_AMBULATORY_CARE_PROVIDER_SITE_OTHER): Payer: 59 | Admitting: Emergency Medicine

## 2013-12-22 VITALS — BP 122/89 | HR 61 | Temp 98.1°F | Resp 18 | Wt 119.0 lb

## 2013-12-22 DIAGNOSIS — Z0000 Encounter for general adult medical examination without abnormal findings: Secondary | ICD-10-CM

## 2013-12-22 DIAGNOSIS — G4700 Insomnia, unspecified: Secondary | ICD-10-CM

## 2013-12-22 DIAGNOSIS — K219 Gastro-esophageal reflux disease without esophagitis: Secondary | ICD-10-CM

## 2013-12-22 DIAGNOSIS — E785 Hyperlipidemia, unspecified: Secondary | ICD-10-CM

## 2013-12-22 DIAGNOSIS — Z1329 Encounter for screening for other suspected endocrine disorder: Secondary | ICD-10-CM

## 2013-12-22 DIAGNOSIS — M25473 Effusion, unspecified ankle: Secondary | ICD-10-CM

## 2013-12-22 DIAGNOSIS — D509 Iron deficiency anemia, unspecified: Secondary | ICD-10-CM

## 2013-12-22 DIAGNOSIS — E611 Iron deficiency: Secondary | ICD-10-CM

## 2013-12-22 DIAGNOSIS — I10 Essential (primary) hypertension: Secondary | ICD-10-CM

## 2013-12-22 DIAGNOSIS — Z79899 Other long term (current) drug therapy: Secondary | ICD-10-CM

## 2013-12-22 DIAGNOSIS — E039 Hypothyroidism, unspecified: Secondary | ICD-10-CM

## 2013-12-22 DIAGNOSIS — R350 Frequency of micturition: Secondary | ICD-10-CM

## 2013-12-22 LAB — FERRITIN: FERRITIN: 8 ng/mL — AB (ref 10–291)

## 2013-12-22 LAB — POCT CBC
GRANULOCYTE PERCENT: 65.8 % (ref 37–80)
HEMATOCRIT: 38.1 % (ref 37.7–47.9)
Hemoglobin: 12.3 g/dL (ref 12.2–16.2)
Lymph, poc: 2.4 (ref 0.6–3.4)
MCH: 30 pg (ref 27–31.2)
MCHC: 32.3 g/dL (ref 31.8–35.4)
MCV: 92.9 fL (ref 80–97)
MID (cbc): 0.6 (ref 0–0.9)
MPV: 6.8 fL (ref 0–99.8)
PLATELET COUNT, POC: 316 10*3/uL (ref 142–424)
POC Granulocyte: 5.8 (ref 2–6.9)
POC LYMPH %: 27.2 % (ref 10–50)
POC MID %: 7 %M (ref 0–12)
RBC: 4.11 M/uL (ref 4.04–5.48)
RDW, POC: 14.6 %
WBC: 8.8 10*3/uL (ref 4.6–10.2)

## 2013-12-22 LAB — POCT URINALYSIS DIPSTICK
Blood, UA: NEGATIVE
Glucose, UA: NEGATIVE
Leukocytes, UA: NEGATIVE
Nitrite, UA: NEGATIVE
Protein, UA: 30
UROBILINOGEN UA: 1
pH, UA: 5.5

## 2013-12-22 LAB — TSH: TSH: 2.314 u[IU]/mL (ref 0.350–4.500)

## 2013-12-22 LAB — VITAMIN B12: VITAMIN B 12: 360 pg/mL (ref 211–911)

## 2013-12-22 MED ORDER — LEVOTHYROXINE SODIUM 50 MCG PO TABS: ORAL_TABLET | ORAL | Status: DC

## 2013-12-22 MED ORDER — AMLODIPINE BESYLATE 5 MG PO TABS: 5.0000 mg | ORAL_TABLET | Freq: Every day | ORAL | Status: DC

## 2013-12-22 MED ORDER — DICYCLOMINE HCL 20 MG PO TABS: 20.0000 mg | ORAL_TABLET | Freq: Two times a day (BID) | ORAL | Status: DC | PRN

## 2013-12-22 MED ORDER — LOSARTAN POTASSIUM 25 MG PO TABS: 25.0000 mg | ORAL_TABLET | Freq: Every day | ORAL | Status: DC

## 2013-12-22 MED ORDER — NORTRIPTYLINE HCL 25 MG PO CAPS: 25.0000 mg | ORAL_CAPSULE | Freq: Two times a day (BID) | ORAL | Status: DC

## 2013-12-22 MED ORDER — DULOXETINE HCL 30 MG PO CPEP: 30.0000 mg | ORAL_CAPSULE | Freq: Every day | ORAL | Status: DC

## 2013-12-22 MED ORDER — ROSUVASTATIN CALCIUM 5 MG PO TABS: 5.0000 mg | ORAL_TABLET | Freq: Every day | ORAL | Status: DC

## 2013-12-22 MED ORDER — OLOPATADINE HCL 0.6 % NA SOLN: NASAL | Status: DC

## 2013-12-22 MED ORDER — DICLOFENAC SODIUM 75 MG PO TBEC: 75.0000 mg | DELAYED_RELEASE_TABLET | Freq: Two times a day (BID) | ORAL | Status: DC

## 2013-12-22 MED ORDER — GUAIFENESIN-CODEINE 100-10 MG/5ML PO SYRP: 10.0000 mL | ORAL_SOLUTION | Freq: Three times a day (TID) | ORAL | Status: DC | PRN

## 2013-12-22 NOTE — Progress Notes (Addendum)
Subjective:  This chart was scribed for Kristy LitesSteve Kristy Ryback, MD by Haywood PaoNadim Abu Hashem, ED Scribe at [facility].The patient was seen in exam room 14 and the patient's care was started at 9:18 AM.   Patient ID: Kristy Arias, female    DOB: 04/02/1950, 63 y.o.   MRN: [id number] Chief Complaint  Patient presents with  . Annual Exam   HPI HPI Comments: Kristy Arias is a 63 y.o. female who [facility].  She sees Dr. Jacinto HalimGanji, her cardiologist regularly and he manages her BP .  Pt states she has bad knees and has seen an orthopedist and has gotten a cortisol treatment. She is going next Friday for physical treatment. .  Pt has gotten a cough last week is taking patanol nasal spray and it has been helping a lot. Pt states she has some sick contacts at work and at home.  Pt wants to get allergy testing but does not want to got to a specialist because it has been very difficult for her to schedule an appointment.  Dr. Jennette KettleNeal is her OBGYN. Her Mammogram is utd.  Her GI doctor is off [address] but does not remember their name. She states she is utd on her colonoscopy. She still has problems with her gallbladder if she eats butter. She does not eat red meat  [facility]  Pt is worried about her memory because of the Cestor and she does not want to take them anymore.  Review of Systems  HENT: Positive for postnasal drip, rhinorrhea, sinus pressure and sneezing.   Respiratory: Positive for cough and shortness of breath.   Cardiovascular: Positive for leg swelling.  Gastrointestinal: Positive for diarrhea and constipation.  Genitourinary: Positive for frequency.  Musculoskeletal: Positive for arthralgias.  Allergic/Immunologic: Positive for environmental allergies.  Neurological: Positive for headaches.  Hematological: Bruises/bleeds easily.  Psychiatric/Behavioral: Positive for decreased concentration.  All other  systems reviewed and are negative.     Objective:  BP 122/89 mmHg  Pulse 61  Temp(Src) 98.1 F (36.7 C) (Oral)  Resp 18  Wt 119 lb (53.978 kg)  SpO2 97%  Physical Exam  Constitutional: She is oriented to person, place, and time. She appears well-developed and well-nourished. No distress.  HENT:  Head: Normocephalic and atraumatic.  Right Ear: External ear normal.  Left Ear: External ear normal.  Nose: Nose normal.  Mouth/Throat: Oropharynx is clear and moist.  Eyes: Conjunctivae and EOM are normal. Pupils are equal, round, and reactive to light.  Neck: Normal range of motion. Neck supple.  Cardiovascular: Normal rate, regular rhythm and normal heart sounds.   Pulmonary/Chest: Effort normal and breath sounds normal.  Abdominal: Soft. Bowel sounds are normal.  Musculoskeletal: Normal range of motion.  Bilateral breast implants.  Neurological: She is alert and oriented to person, place, and time.  Skin: Skin is warm and dry.  Psychiatric: She has a normal mood and affect. Her behavior is normal.  Nursing note and vitals reviewed.  Results for orders placed or performed in visit on 12/22/13  POCT CBC  Result Value Ref Range   WBC 8.8 4.6 - 10.2 K/uL   Lymph, poc 2.4 0.6 - 3.4   POC LYMPH PERCENT 27.2 10 - 50 %L   MID (cbc) 0.6 0 - 0.9   POC MID % 7.0 0 - 12 %M   POC Granulocyte 5.8 2 - 6.9   Granulocyte  percent 65.8 37 - 80 %G   RBC 4.11 4.04 - 5.48 M/uL   Hemoglobin 12.3 12.2 - 16.2 g/dL   HCT, POC 54.038.1 98.137.7 - 47.9 %   MCV 92.9 80 - 97 fL   MCH, POC 30.0 27 - 31.2 pg   MCHC 32.3 31.8 - 35.4 g/dL   RDW, POC 19.114.6 %   Platelet Count, POC 316 142 - 424 K/uL   MPV 6.8 0 - 99.8 fL  POCT urinalysis dipstick  Result Value Ref Range   Color, UA dark yellow    Clarity, UA hazy    Glucose, UA neg    Bilirubin, UA moderate    Ketones, UA trace    Spec Grav, UA >=1.030    Blood, UA neg    pH, UA 5.5    Protein, UA 30    Urobilinogen, UA 1.0    Nitrite, UA neg     Leukocytes, UA Negative        Assessment & Plan:  Patient stable on current medications. 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 She did have moderate bilirubin in her urine with ketones but possibly this is secondary to concentration of the urine. Comprehensive metabolic panel was done along with her routine labs. All meds will be refilled. She has had her flu shot.I personally performed the services described in this documentation, which was scribed in my presence. The recorded information has been reviewed and is accurate.

## 2013-12-24 LAB — COMPREHENSIVE METABOLIC PANEL
ALT: 11 U/L (ref 0–35)
AST: 13 U/L (ref 0–37)
Albumin: 3.7 g/dL (ref 3.5–5.2)
Alkaline Phosphatase: 64 U/L (ref 39–117)
BILIRUBIN TOTAL: 0.3 mg/dL (ref 0.2–1.2)
BUN: 15 mg/dL (ref 6–23)
CO2: 23 meq/L (ref 19–32)
Calcium: 8.9 mg/dL (ref 8.4–10.5)
Chloride: 105 mEq/L (ref 96–112)
Creat: 0.91 mg/dL (ref 0.50–1.10)
GLUCOSE: 91 mg/dL (ref 70–99)
Potassium: 4 mEq/L (ref 3.5–5.3)
SODIUM: 138 meq/L (ref 135–145)
TOTAL PROTEIN: 6.5 g/dL (ref 6.0–8.3)

## 2013-12-24 LAB — VITAMIN D 25 HYDROXY (VIT D DEFICIENCY, FRACTURES): Vit D, 25-Hydroxy: 33 ng/mL (ref 30–100)

## 2013-12-24 LAB — LIPID PANEL
CHOLESTEROL: 189 mg/dL (ref 0–200)
HDL: 69 mg/dL (ref >39)
LDL Cholesterol: 66 mg/dL (ref 0–99)
Total CHOL/HDL Ratio: 2.7 Ratio
Triglycerides: 269 mg/dL — ABNORMAL HIGH (ref <150)
VLDL: 54 mg/dL — ABNORMAL HIGH (ref 0–40)

## 2013-12-24 LAB — MAGNESIUM: MAGNESIUM: 1.6 mg/dL (ref 1.5–2.5)

## 2013-12-28 NOTE — Addendum Note (Signed)
Addended by: Johnnette LitterARDWELL, Tashena Ibach M on: 12/28/2013 12:26 PM   Modules accepted: Orders, SmartSet

## 2013-12-31 ENCOUNTER — Telehealth: Payer: Self-pay | Admitting: Family Medicine

## 2013-12-31 NOTE — Telephone Encounter (Signed)
[facility] called regarding Olopatadine (patanol) with question if for eye or nose. Spoke with Dr. Cleta Albertsaub and it is eye and use 1 drop each eye twice daily. Pharmacy notified

## 2013-12-31 NOTE — Telephone Encounter (Signed)
Advise azelastine nasal 0.1%, 1-2 sprays in each nostril BID, 30 ml, RF x 2

## 2013-12-31 NOTE — Telephone Encounter (Signed)
Pharm called back and reported that ins in not covering patanol or anything else in the class except for [employer] and it is still co-pay of $50 (and they would have to order it). Only cheaper alternatives they offered were azelastine drops ot Zaditor OTC drops. Please advise.

## 2014-01-01 NOTE — Telephone Encounter (Signed)
Advised pharmacy change was ok.

## 2014-01-03 ENCOUNTER — Encounter (HOSPITAL_COMMUNITY): Payer: Self-pay | Admitting: Cardiology

## 2014-01-21 LAB — POC HEMOCCULT BLD/STL (OFFICE/1-CARD/DIAGNOSTIC): FECAL OCCULT BLD: NEGATIVE

## 2014-01-21 NOTE — Addendum Note (Signed)
Addended byAlden Benjamin: Merari Pion R on: 01/21/2014 11:21 AM   Modules accepted: Orders

## 2014-01-21 NOTE — Addendum Note (Signed)
Addended byAlden Benjamin: Jaxan Michel R on: 01/21/2014 11:22 AM   Modules accepted: Orders

## 2014-03-11 ENCOUNTER — Other Ambulatory Visit: Payer: Self-pay | Admitting: Emergency Medicine

## 2014-04-12 ENCOUNTER — Ambulatory Visit (INDEPENDENT_AMBULATORY_CARE_PROVIDER_SITE_OTHER): Payer: 59 | Admitting: Emergency Medicine

## 2014-04-12 ENCOUNTER — Ambulatory Visit (HOSPITAL_COMMUNITY)
Admission: RE | Admit: 2014-04-12 | Discharge: 2014-04-12 | Disposition: A | Discharge status: Home / Self Care | Payer: 59 | Source: Ambulatory Visit | Attending: Emergency Medicine | Admitting: Emergency Medicine

## 2014-04-12 ENCOUNTER — Ambulatory Visit (INDEPENDENT_AMBULATORY_CARE_PROVIDER_SITE_OTHER): Payer: 59

## 2014-04-12 VITALS — BP 164/90 | HR 83 | Temp 97.8°F | Resp 17 | Ht 63.0 in | Wt 118.6 lb

## 2014-04-12 DIAGNOSIS — I10 Essential (primary) hypertension: Secondary | ICD-10-CM

## 2014-04-12 DIAGNOSIS — H53149 Visual discomfort, unspecified: Secondary | ICD-10-CM

## 2014-04-12 DIAGNOSIS — R519 Headache, unspecified: Secondary | ICD-10-CM

## 2014-04-12 DIAGNOSIS — H5319 Other subjective visual disturbances: Secondary | ICD-10-CM

## 2014-04-12 DIAGNOSIS — R51 Headache: Secondary | ICD-10-CM | POA: Insufficient documentation

## 2014-04-12 LAB — POCT CBC
Granulocyte percent: 69.9 %G (ref 37–80)
HCT, POC: 41.7 % (ref 37.7–47.9)
HEMOGLOBIN: 12.8 g/dL (ref 12.2–16.2)
LYMPH, POC: 1.9 (ref 0.6–3.4)
MCH: 30.8 pg (ref 27–31.2)
MCHC: 30.6 g/dL — AB (ref 31.8–35.4)
MCV: 100.6 fL — AB (ref 80–97)
MID (CBC): 0.4 (ref 0–0.9)
MPV: 6.8 fL (ref 0–99.8)
POC Granulocyte: 5.2 (ref 2–6.9)
POC LYMPH PERCENT: 25.1 %L (ref 10–50)
POC MID %: 5 %M (ref 0–12)
Platelet Count, POC: 339 10*3/uL (ref 142–424)
RBC: 4.15 M/uL (ref 4.04–5.48)
RDW, POC: 15.9 %
WBC: 7.4 10*3/uL (ref 4.6–10.2)

## 2014-04-12 MED ORDER — BUTALBITAL-APAP-CAFFEINE 50-325-40 MG PO TABS: ORAL_TABLET | ORAL | Status: DC

## 2014-04-12 MED ORDER — AMOXICILLIN 875 MG PO TABS: 875.0000 mg | ORAL_TABLET | Freq: Two times a day (BID) | ORAL | Status: DC

## 2014-04-12 NOTE — Patient Instructions (Signed)
[facility] without contrast Register as an outpatient through the ER

## 2014-04-12 NOTE — Progress Notes (Addendum)
Subjective:    Patient ID: Kristy Arias, female    DOB: 05/23/1950, 64 y.o.   MRN: [id number] This chart was scribed for Lesle ChrisSteven Jennice Renegar, MD by Jolene Provostobert Halas, Medical Scribe. This patient was seen in Room 10 and the patient's care was started at 5:54 PM.  Chief Complaint  Patient presents with  . Dizziness    x 1 week  . Headache    x 1 week  . Cough    x 1 month    HPI HPI Comments: Kristy CivatteDiane C Cada is a 64 y.o. female with a hx of HTN, fibromuscular dysplasia, and HLD who [facility] complaining of a dry hacking cough that started one month ago. Pt endorses associated mild constant rhinorrhea, postnasal drip and mild ear pain. Pt states her nose drips all the time, but never enough to blow. Pt states she has never had a fever. Pt endorses sick contacts.  Pt also states she has a severe HA that started 9 days ago which has improved and worsened intermittently, but never resolved. Pt endorses associated dizziness, and photophobia. Pt states she vomited at onset. Pt states she took one Rizatriptan at onset without relief. Pt states she has taken Excedrin, fioricet, and phenylephrine intermittently without relief. Pt states she may have had an allergic reaction to the Tylenol 3, and states her external neck has been itching. Pt states her pain is an 8/10 currently. Pt denies weakness in arms of legs. Pt had one event of vomiting 9 days ago.   Pt states she has been taking phenylephrine intermittently and her BP has been elevated.   Review of Systems  Constitutional: Negative for fever and chills.  Respiratory: Positive for cough. Negative for choking.   Gastrointestinal: Positive for nausea and vomiting.  Neurological: Positive for dizziness and headaches.       Objective:   Physical Exam  Constitutional: She is oriented to person, place, and time. She appears well-developed and well-nourished. No distress.  Alert and cooperative, in no distress.  HENT:  Head: Normocephalic and  atraumatic.  Eyes: Pupils are equal, round, and reactive to light.  Neck: Normal range of motion. Neck supple.  Cardiovascular: Normal rate and normal heart sounds.   No murmur heard. Repeat BP 170/82.  Pulmonary/Chest: Effort normal. No respiratory distress. She has no wheezes.  Musculoskeletal: Normal range of motion.  Neurological: She is alert and oriented to person, place, and time. Coordination normal.  Skin: Skin is warm and dry. She is not diaphoretic.  Psychiatric: She has a normal mood and affect. Her behavior is normal.  Nursing note and vitals reviewed. [facility] reading (PRIMARY) by  Dr Cleta Albertsaub no infiltrates seen there are bilateral breast implants. Results for orders placed or performed in visit on 04/12/14  POCT CBC  Result Value Ref Range   WBC 7.4 4.6 - 10.2 K/uL   Lymph, poc 1.9 0.6 - 3.4   POC LYMPH PERCENT 25.1 10 - 50 %L   MID (cbc) 0.4 0 - 0.9   POC MID % 5.0 0 - 12 %M   POC Granulocyte 5.2 2 - 6.9   Granulocyte percent 69.9 37 - 80 %G   RBC 4.15 4.04 - 5.48 M/uL   Hemoglobin 12.8 12.2 - 16.2 g/dL   HCT, POC 40.941.7 81.137.7 - 47.9 %   MCV 100.6 (A) 80 - 97 fL   MCH, POC 30.8 27 - 31.2 pg   MCHC 30.6 (A) 31.8 - 35.4 g/dL   RDW,  POC 15.9 %   Platelet Count, POC 339 142 - 424 K/uL   MPV 6.8 0 - 99.8 fL       Assessment & Plan:   Referral has been made to GI Dr. Kinnie Scales twice and pt did not  to see him either time.Will proceed with CT Head. Prescription for Fioricet. IF  CT shows signs of sinusitis patient will fill her prescription for amoxicillin. Patient advised she cannot take any decongestants and she understands. She does also have some fears about tuberculosis and we may need to do a Quantiferrin gold in  the future I personally performed the services described in this documentation, which was scribed in my presence. The recorded information has been reviewed and is accurate.

## 2014-04-15 ENCOUNTER — Telehealth: Payer: Self-pay

## 2014-04-15 NOTE — Telephone Encounter (Signed)
Dr. Cleta Albertsaub wanted me to call pt and have her make a f/u appt 7-10 days. Pt has appt on 3/29 @ 10a

## 2014-04-23 ENCOUNTER — Ambulatory Visit (INDEPENDENT_AMBULATORY_CARE_PROVIDER_SITE_OTHER): Payer: 59 | Admitting: Emergency Medicine

## 2014-04-23 ENCOUNTER — Encounter: Payer: Self-pay | Admitting: Emergency Medicine

## 2014-04-23 VITALS — BP 150/84 | HR 72 | Temp 97.9°F | Resp 16 | Ht 61.5 in | Wt 116.0 lb

## 2014-04-23 DIAGNOSIS — Z9109 Other allergy status, other than to drugs and biological substances: Secondary | ICD-10-CM

## 2014-04-23 DIAGNOSIS — D649 Anemia, unspecified: Secondary | ICD-10-CM | POA: Diagnosis not present

## 2014-04-23 DIAGNOSIS — R51 Headache: Secondary | ICD-10-CM

## 2014-04-23 DIAGNOSIS — Z91048 Other nonmedicinal substance allergy status: Secondary | ICD-10-CM

## 2014-04-23 DIAGNOSIS — R413 Other amnesia: Secondary | ICD-10-CM

## 2014-04-23 DIAGNOSIS — R519 Headache, unspecified: Secondary | ICD-10-CM

## 2014-04-23 NOTE — Progress Notes (Addendum)
Subjective:  This chart was scribed for Collene Gobble, MD by Charline Bills, ED Scribe. The patient was seen in room 22. Patient's care was started at 10:31 AM.   Patient ID: Kristy Arias, female    DOB: August 25, 1950, 64 y.o.   MRN: [id number]  Chief Complaint  Patient presents with  . Follow-up    headaches   HPI HPI Comments: Kristy Arias is a 64 y.o. female, with a h/o hyperlipidemia, HTN, glaucoma, anxiety, depression, anemia, GERD, who presents to the [facility] for a follow-up regarding HAs. Pt states that she is still experiencing HAs but states that they have improved. She reports a severe sinus related HA on Friday, 04/19/14. She has noticed that HAs tend to be more severe during the end of the week. Pt states that HAs begin as a buzzing/tingling sensation before onset of pain. Pt reports associated decreased memory/concentration and a clogged sensation in her R ear that improves with applying pressure. She has been treating HAs with Excedrin and Fioricet with relief. Pt also takes Maxalt PRN. She has tried Singulair in the past without relief. Pt was seen by a neurologist in [place] in 2001.   Hypertension  Pt reports taking Losartan and Norvasc as prescribed. BP on Examination: 160/90  Pt now works 3 days/week. 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   Past Medical History  Diagnosis Date  . Anxiety   . Depression   . Glaucoma   . Hyperlipidemia   . Heart murmur   . Hypertension   . Hypothyroidism   . Syndrome X, cardiac   . Shortness of breath     syndrome x  . Arthritis   . GERD (gastroesophageal reflux disease)   . Anemia    Current Outpatient Prescriptions on File Prior to Visit  Medication Sig Dispense Refill  . ALPRAZolam (XANAX) 0.5 MG tablet Take 0.5 mg by mouth 3 (three) times daily as needed for anxiety.    Marland Kitchen amLODipine (NORVASC) 5 MG tablet Take 1 tablet (5 mg total) by mouth daily. 90 tablet 3  .  butalbital-acetaminophen-caffeine (FIORICET) 50-325-40 MG per tablet 1 tablet every 4-6 hours as needed for headache 20 tablet 1  . Chlorphen-Pyril-Phenyleph (RU-HIST FORTE PO) Take 1 tablet by mouth daily. For allergies    . cholestyramine (QUESTRAN) 4 G packet Take one packet daily for diarrhea. 60 each 12  . diclofenac (VOLTAREN) 75 MG EC tablet Take 1 tablet (75 mg total) by mouth 2 (two) times daily. 180 tablet 3  . dicyclomine (BENTYL) 20 MG tablet Take 1 tablet (20 mg total) by mouth 2 (two) times daily as needed (gallbladder issues). 90 tablet 3  . DULoxetine (CYMBALTA) 30 MG capsule Take 1 capsule (30 mg total) by mouth at bedtime. 30 capsule 11  . estrogens, conjugated, (PREMARIN) 0.9 MG tablet Take 0.9 mg by mouth daily. Take daily for 21 days then do not take for 7 days.    Marland Kitchen guaiFENesin-codeine (ROBITUSSIN AC) 100-10 MG/5ML syrup Take 10 mLs by mouth 3 (three) times daily as needed for cough. 120 mL 0  . levothyroxine (SYNTHROID, LEVOTHROID) 50 MCG tablet Take one tablet daily 30 tablet 11  . losartan (COZAAR) 25 MG tablet Take 1 tablet (25 mg total) by mouth daily. Take 1 tablet by mouth daily 90 tablet 3  . nortriptyline (PAMELOR) 25 MG capsule Take 1 capsule (25 mg total) by mouth 2 (two) times  daily. 180 capsule 3  . rosuvastatin (CRESTOR) 5 MG tablet Take 1 tablet (5 mg total) by mouth at bedtime. 30 tablet 11  . temazepam (RESTORIL) 30 MG capsule Take 30 mg by mouth at bedtime as needed for sleep.    Marland Kitchen. timolol (BETIMOL) 0.5 % ophthalmic solution Place 1 drop into both eyes 2 (two) times daily.    . Travoprost, BAK Free, (TRAVATAN) 0.004 % SOLN ophthalmic solution Place 1 drop into both eyes at bedtime.     Marland Kitchen. acetaminophen-codeine (TYLENOL #3) 300-30 MG per tablet Take by mouth every 4 (four) hours as needed for moderate pain.    Marland Kitchen. esomeprazole (NEXIUM) 40 MG capsule Take 20 mg by mouth 2 (two) times a week. Until after surgery    . Olopatadine HCl 0.6 % SOLN Use as needed (Patient  not taking: Reported on 04/12/2014) 1 Bottle 3   No current facility-administered medications on file prior to visit.   No Known Allergies  Review of Systems  HENT: Positive for sinus pressure.   Neurological: Positive for headaches.  Psychiatric/Behavioral: Positive for decreased concentration.      Objective:   Physical Exam  BP on Examination: 160/90 CONSTITUTIONAL: Well developed/well nourished HEAD: Normocephalic/atraumatic EYES: EOMI/PERRL, discs are sharp ENMT: Mucous membranes moist NECK: supple no meningeal signs SPINE/BACK:entire spine nontender CV: S1/S2 noted, no murmurs/rubs/gallops noted LUNGS: Lungs are clear to auscultation bilaterally, no apparent distress ABDOMEN: soft, nontender, no rebound or guarding, bowel sounds noted throughout abdomen GU:no cva tenderness NEURO: Pt is awake/alert/appropriate, moves all extremitiesx4.  No facial droop.   EXTREMITIES: pulses normal/equal, full ROM SKIN: warm, color normal PSYCH: no abnormalities of mood noted, alert and oriented to situation   Mini-Mental status exam was normal.  Assessment & Plan:  Referral made for allergy testing. Referral made to neurology to evaluate recent memory issues as well as chronic headaches. I will forward a copy of this note to Dr. Sherril CroonVyas regarding her blood pressure.Patient needs referral to Dr. Kinnie ScalesMedoff regarding her anemia. She had an appointment in December and did not keep it. I personally performed the services described in this documentation, which was scribed in my presence. The recorded information has been reviewed and is accurate.

## 2014-05-29 ENCOUNTER — Other Ambulatory Visit: Payer: Self-pay | Admitting: Emergency Medicine

## 2014-05-31 ENCOUNTER — Encounter: Payer: Self-pay | Admitting: Neurology

## 2014-05-31 ENCOUNTER — Ambulatory Visit (INDEPENDENT_AMBULATORY_CARE_PROVIDER_SITE_OTHER): Payer: 59 | Admitting: Neurology

## 2014-05-31 VITALS — BP 122/70 | HR 68 | Ht 61.5 in | Wt 118.0 lb

## 2014-05-31 DIAGNOSIS — R413 Other amnesia: Secondary | ICD-10-CM | POA: Diagnosis not present

## 2014-05-31 DIAGNOSIS — G43709 Chronic migraine without aura, not intractable, without status migrainosus: Secondary | ICD-10-CM

## 2014-05-31 DIAGNOSIS — R51 Headache: Secondary | ICD-10-CM

## 2014-05-31 DIAGNOSIS — R519 Headache, unspecified: Secondary | ICD-10-CM

## 2014-05-31 LAB — VITAMIN B12: VITAMIN B 12: 274 pg/mL (ref 211–911)

## 2014-05-31 LAB — TSH: TSH: 4.096 u[IU]/mL (ref 0.350–4.500)

## 2014-05-31 NOTE — Progress Notes (Signed)
NEUROLOGY CONSULTATION NOTE  Kristy Arias MRN: [id number] DOB: 01/27/1950  Referring provider: Dr. Lesle Chris Primary care provider: Dr. Lesle Chris  Reason for consult:  Memory loss and headaches  Dear Dr Cleta Alberts:  Thank you for your kind referral of Kristy Arias for consultation of the above symptoms. Although her history is well known to you, please allow me to reiterate it for the purpose of our medical record. Records and images were personally reviewed where available.  HISTORY OF PRESENT ILLNESS: This is a 64 year old right-handed woman with a history of hypertension, hypothyroidism, hyperlipidemia, presenting for evaluation of the above symptoms.  1. Headaches. She reports that headaches started in her teenage years. She had seen a headache specialist in 2001 and was diagnosed with cluster or migraine-type headaches and tried on different medications. This past Spring, she has had a different type of headache. She has tingling above her ears constantly, and between March and April she was having headaches daily for 5 weeks. At least 3 of these were associated with vomiting when she had severe throbbing pain. She states when she has sinus problems or allergies, she has stabbing pain behind one eye, however these were more concentrated in the middle of her forehead, with constant pressure. No visual obscurations, she had associated photo and phonophobia as well. She has an old prescription for nortriptyline for insomnia and bladder control, and started it 2-3 weeks ago. Since then, her headaches have quieted down at  dose. The tingling above her ears is still there. Last Wednesday she had a bad headache with pressure, stabbing, and pounding pain that wakes her up from sleep. She has been taking either Fioricet or Excedrin 4 to 5 days of the week. She had cut back on Excedrin, but then stated she still takes it daily. She denies any headaches currently, but still feels some ongoing  pressure. She has only taken Fioricet twice in the past week. She denies any focal numbness/tingling/weakness, no dizziness, diplopia, dysarthria/dysphagia. She uses Xanax for sleep. She usually gets 6-7 hours of sleep and feels mostly rested. There is a history of migraines in her mother, son, and daughter.   2. Memory. She started noticing memory changes 5 years ago. She states that she has not been great with short-term memory "all my life." She works as a Teacher, early years/pre, and has noticed that she cannot work with numbers as well. She has noticed word-finding difficulties for the past 2-3 years. Her husband has told her she repeats herself. She cannot recall what she went into a room for. She has always been "directionally-challenged" but denies getting lost driving. She had left the stove on once. Her husband is in charge of bills. When she has to take care of her grandkids, she stops her Crestor, and notices some difference. However, even if she does not take it, she still cannot remember some things. She does the same with Restoril, feeling that it may contribute to memory changes as well. Her parents had some memory issues.   Laboratory Data: Lab Results  Component Value Date   WBC 7.4 04/12/2014   HGB 12.8 04/12/2014   HCT 41.7 04/12/2014   MCV 100.6* 04/12/2014   PLT 367 06/26/2012     Chemistry      Component Value Date/Time   NA 138 12/22/2013 0949   K 4.0 12/22/2013 0949   CL 105 12/22/2013 0949   CO2 23 12/22/2013 0949   BUN 15 12/22/2013 0949  CREATININE 0.91 12/22/2013 0949   CREATININE 1.03 06/26/2012 1150      Component Value Date/Time   CALCIUM 8.9 12/22/2013 0949   ALKPHOS 64 12/22/2013 0949   AST 13 12/22/2013 0949   ALT 11 12/22/2013 0949   BILITOT 0.3 12/22/2013 0949       PAST MEDICAL HISTORY: Past Medical History  Diagnosis Date  . Anxiety   . Depression   . Glaucoma   . Hyperlipidemia   . Heart murmur   . Hypertension   . Hypothyroidism   . Syndrome X,  cardiac   . Shortness of breath     syndrome x  . Arthritis   . GERD (gastroesophageal reflux disease)   . Anemia     PAST SURGICAL HISTORY: Past Surgical History  Procedure Laterality Date  . Breast enhancement surgery    . Breast surgery    . Cosmetic surgery    . Dilation and curettage of uterus  2013  . Cardiac catheterization  4/14  . Laparoscopic cholecystectomy single port N/A 06/29/2012    Procedure: LAPAROSCOPIC CHOLECYSTECTOMY SINGLE PORT IOC ;  Surgeon: Ardeth SportsmanSteven C. Gross, MD;  Location: [facility];  Service: General;  Laterality: N/A;  . Cholecystectomy    . Left heart catheterization with coronary angiogram N/A 05/23/2012    Procedure: LEFT HEART CATHETERIZATION WITH CORONARY ANGIOGRAM;  Surgeon: Pamella PertJagadeesh R Ganji, MD;  [facility];  Service: Cardiovascular;  Laterality: N/A;    MEDICATIONS: Current Outpatient Prescriptions on File Prior to Visit  Medication Sig Dispense Refill  . acetaminophen-codeine (TYLENOL #3) 300-30 MG per tablet Take by mouth every 4 (four) hours as needed for moderate pain.    Marland Kitchen. ALPRAZolam (XANAX) 0.5 MG tablet Take 0.5 mg by mouth 3 (three) times daily as needed for anxiety.    Marland Kitchen. amLODipine (NORVASC) 5 MG tablet Take 1 tablet (5 mg total) by mouth daily. 90 tablet 3  . butalbital-acetaminophen-caffeine (FIORICET) 50-325-40 MG per tablet 1 tablet every 4-6 hours as needed for headache 20 tablet 1  . Chlorphen-Pyril-Phenyleph (RU-HIST FORTE PO) Take 1 tablet by mouth daily. For allergies    . cholestyramine (QUESTRAN) 4 G packet Take one packet daily for diarrhea. 60 each 12  . diclofenac (VOLTAREN) 75 MG EC tablet Take 1 tablet (75 mg total) by mouth 2 (two) times daily. 180 tablet 3  . dicyclomine (BENTYL) 20 MG tablet Take 1 tablet (20 mg total) by mouth 2 (two) times daily as needed (gallbladder issues). 90 tablet 3  . DULoxetine (CYMBALTA) 30 MG capsule Take 1 capsule (30 mg total) by mouth at bedtime. 30 capsule 11  . esomeprazole (NEXIUM)  40 MG capsule Take 20 mg by mouth 2 (two) times a week. Until after surgery    . estrogens, conjugated, (PREMARIN) 0.9 MG tablet Take 0.9 mg by mouth daily. Take daily for 21 days then do not take for 7 days.    Marland Kitchen. levothyroxine (SYNTHROID, LEVOTHROID) 50 MCG tablet Take one tablet daily 30 tablet 11  . lipase/protease/amylase (CREON) 12000 UNITS CPEP capsule Take 12,000 Units by mouth 3 (three) times a week.    . losartan (COZAAR) 25 MG tablet Take 1 tablet (25 mg total) by mouth daily. Take 1 tablet by mouth daily 90 tablet 3  . nortriptyline (PAMELOR) 25 MG capsule Take 1 capsule (25 mg total) by mouth 2 (two) times daily. 180 capsule 3  . Olopatadine HCl 0.6 % SOLN Use as needed 1 Bottle 3  . rosuvastatin (CRESTOR)  5 MG tablet Take 1 tablet (5 mg total) by mouth at bedtime. 30 tablet 11  . temazepam (RESTORIL) 30 MG capsule Take 30 mg by mouth at bedtime as needed for sleep.    Marland Kitchen timolol (BETIMOL) 0.5 % ophthalmic solution Place 1 drop into both eyes 2 (two) times daily.    . Travoprost, BAK Free, (TRAVATAN) 0.004 % SOLN ophthalmic solution Place 1 drop into both eyes at bedtime.      No current facility-administered medications on file prior to visit.    ALLERGIES: No Known Allergies  FAMILY HISTORY: Family History  Problem Relation Age of Onset  . Hypertension Father   . Diabetes Father   . Heart disease Father   . Hyperlipidemia Father   . Hypertension Maternal Grandmother   . Heart disease Maternal Grandmother   . Diabetes Maternal Grandfather   . Hyperlipidemia Maternal Grandfather   . Heart disease Maternal Grandfather   . Osteoporosis Paternal Grandmother   . Heart disease Paternal Grandmother   . Heart disease Paternal Grandfather   . Cancer Mother   . Hyperlipidemia Mother   . Hypertension Mother   . Cancer Sister     SOCIAL HISTORY: History   Social History  . Marital Status: Married    Spouse Name: N/A  . Number of Children: N/A  . Years of Education: N/A     Occupational History  . Not on file.   Social History Main Topics  . Smoking status: Never Smoker   . Smokeless tobacco: Never Used  . Alcohol Use: No  . Drug Use: No  . Sexual Activity: Yes    Birth Control/ Protection: Surgical   Other Topics Concern  . Not on file   Social History Narrative    REVIEW OF SYSTEMS: Constitutional: No fevers, chills, or sweats, no generalized fatigue, change in appetite Eyes: No visual changes, double vision, eye pain Ear, nose and throat: No hearing loss, ear pain, nasal congestion, sore throat Cardiovascular: No chest pain, palpitations Respiratory:  No shortness of breath at rest or with exertion, wheezes GastrointestinaI: No nausea, vomiting, diarrhea, abdominal pain, fecal incontinence Genitourinary:  No dysuria, urinary retention or frequency Musculoskeletal:  No neck pain, back pain Integumentary: No rash, pruritus, skin lesions Neurological: as above Psychiatric: No depression, insomnia, anxiety Endocrine: No palpitations, fatigue, diaphoresis, mood swings, change in appetite, change in weight, increased thirst Hematologic/Lymphatic:  No anemia, purpura, petechiae. Allergic/Immunologic: no itchy/runny eyes, nasal congestion, recent allergic reactions, rashes  PHYSICAL EXAM: Filed Vitals:   05/31/14 0746  BP: 122/70  Pulse: 68   General: No acute distress Head:  Normocephalic/atraumatic Eyes: Fundoscopic exam shows bilateral sharp discs, no vessel changes, exudates, or hemorrhages Neck: supple, no paraspinal tenderness, full range of motion Back: No paraspinal tenderness Heart: regular rate and rhythm Lungs: Clear to auscultation bilaterally. Vascular: No carotid bruits. Skin/Extremities: No rash, no edema Neurological Exam: Mental status: alert and oriented to person, place, and time, no dysarthria or aphasia, Fund of knowledge is appropriate.  Recent and remote memory are intact.  Attention and concentration are normal.     Able to name objects and repeat phrases.  Montreal Cognitive Assessment  05/31/2014  Visuospatial/ Executive (0/5) 5  Naming (0/3) 3  Attention: Read list of digits (0/2) 2  Attention: Read list of letters (0/1) 1  Attention: Serial 7 subtraction starting at 100 (0/3) 3  Language: Repeat phrase (0/2) 2  Language : Fluency (0/1) 1  Abstraction (0/2) 2  Delayed Recall (0/5)  5  Orientation (0/6) 6  Total 30  Adjusted Score (based on education) 30   Cranial nerves: CN I: not tested CN II: pupils equal, round and reactive to light, visual fields intact, fundi unremarkable. CN III, IV, VI:  full range of motion, no nystagmus, no ptosis CN V: facial sensation intact CN VII: upper and lower face symmetric CN VIII: hearing intact to finger rub CN IX, X: gag intact, uvula midline CN XI: sternocleidomastoid and trapezius muscles intact CN XII: tongue midline Bulk & Tone: normal, no fasciculations. Motor: 5/5 throughout with no pronator drift. Sensation: intact to light touch, cold, pin, vibration and joint position sense.  No extinction to double simultaneous stimulation.  Romberg test negative Deep Tendon Reflexes: +2 throughout, no ankle clonus Plantar responses: downgoing bilaterally Cerebellar: no incoordination on finger to nose, heel to shin. No dysdiadochokinesia Gait: narrow-based and steady, able to tandem walk adequately. Tremor: none  IMPRESSION: This is a 64 year old right-handed woman with a history of hypertension, hypothyroidism, presenting for worsening memory and headaches. Her MOCA score today is normal 30/30. Neurological exam is normal. The headaches have improved with her starting nortriptyline, however she continues to feel a constant pressure and tingling. MRI brain without contrast will be ordered to assess for underlying structural abnormality. TSH and B12 will be ordered for memory complaints. No indication to start cholinesterase inhibitors at this time. She may  benefit from Neuropsychological evaluation in the future. We discussed the importance of physical exercise and brain stimulation exercises for brain health. She will increase dose of nortriptyline to 50mg  qhs for headache prophylaxis, and will start reducing Excedrin and Fioricet intake, there is likely a component of medication overuse headaches with her current symptoms. She will keep a headache calendar and follow-up in 3 months.   Thank you for allowing me to participate in the care of this patient. Please do not hesitate to call for any questions or concerns.   Patrcia DollyKaren Aquino, M.D.  CC: Dr. Cleta Albertsaub

## 2014-05-31 NOTE — Patient Instructions (Signed)
1. Schedule MRI brain without contrast 2. Bloodwork for B12, TSH 3. Increase nortriptyline 25mg : Take 2 caps at bedtime 4. Minimize intake of any rescue medication (Fioricet, Excedrin, Tylenol) to 2-3 a week to avoid rebound headaches 5. Physical exercise and brain stimulation exercises are important for brain health 6. Keep a headache calendar, follow-up in 3 months

## 2014-06-05 ENCOUNTER — Telehealth: Payer: Self-pay | Admitting: Family Medicine

## 2014-06-05 DIAGNOSIS — R413 Other amnesia: Secondary | ICD-10-CM | POA: Insufficient documentation

## 2014-06-05 DIAGNOSIS — R51 Headache: Secondary | ICD-10-CM

## 2014-06-05 DIAGNOSIS — R519 Headache, unspecified: Secondary | ICD-10-CM | POA: Insufficient documentation

## 2014-06-05 DIAGNOSIS — G43709 Chronic migraine without aura, not intractable, without status migrainosus: Secondary | ICD-10-CM | POA: Insufficient documentation

## 2014-06-05 NOTE — Telephone Encounter (Signed)
I agree with allergist that it is highly unlikely to have significantly affected the results. Thanks

## 2014-06-05 NOTE — Telephone Encounter (Signed)
-----   Message from Van ClinesKaren M Aquino, MD sent at 06/03/2014  1:54 PM EDT ----- Pls let her know bloodwork is normal. Her B12 level is low normal (274), usually would want B12 above 400 in patients with neurological symptoms. Recommend start daily B12 500mcg supplements. Thanks

## 2014-06-05 NOTE — Telephone Encounter (Signed)
Patient was notified of results & advisement. 

## 2014-06-05 NOTE — Telephone Encounter (Signed)
I was speaking with her about her labs. She wanted to ask you about her nortriptyline. She had some allergy testing done on Monday & states everything came back normal. She states the Allergist did mention to her that the nortriptyline could affect the results of the allergy test. She states he told her that he didn't think that it affected her test results, but she wanted to know what your thoughts were on this.

## 2014-06-05 NOTE — Telephone Encounter (Signed)
Patient was notified.

## 2014-06-07 ENCOUNTER — Ambulatory Visit (HOSPITAL_COMMUNITY): Admission: RE | Admit: 2014-06-07 | Payer: 59 | Source: Ambulatory Visit

## 2014-06-10 ENCOUNTER — Ambulatory Visit (HOSPITAL_COMMUNITY)
Admission: RE | Admit: 2014-06-10 | Discharge: 2014-06-10 | Disposition: A | Discharge status: Home / Self Care | Payer: 59 | Source: Ambulatory Visit | Attending: Neurology | Admitting: Neurology

## 2014-06-10 DIAGNOSIS — F329 Major depressive disorder, single episode, unspecified: Secondary | ICD-10-CM | POA: Diagnosis not present

## 2014-06-10 DIAGNOSIS — R110 Nausea: Secondary | ICD-10-CM | POA: Insufficient documentation

## 2014-06-10 DIAGNOSIS — H539 Unspecified visual disturbance: Secondary | ICD-10-CM | POA: Diagnosis not present

## 2014-06-10 DIAGNOSIS — I10 Essential (primary) hypertension: Secondary | ICD-10-CM | POA: Insufficient documentation

## 2014-06-10 DIAGNOSIS — R413 Other amnesia: Secondary | ICD-10-CM | POA: Insufficient documentation

## 2014-06-10 DIAGNOSIS — R51 Headache: Secondary | ICD-10-CM | POA: Diagnosis not present

## 2014-06-10 DIAGNOSIS — E785 Hyperlipidemia, unspecified: Secondary | ICD-10-CM | POA: Diagnosis not present

## 2014-06-12 ENCOUNTER — Telehealth: Payer: Self-pay | Admitting: Family Medicine

## 2014-06-12 NOTE — Telephone Encounter (Signed)
-----   Message from Van ClinesKaren M Aquino, MD sent at 06/11/2014  4:17 PM EDT ----- Pls let patient know I reviewed MRI brain, no evidence of tumor, stroke, or bleed. It shows age-related changes. Thanks

## 2014-06-12 NOTE — Telephone Encounter (Signed)
Patient was notified of results.  

## 2014-07-23 ENCOUNTER — Ambulatory Visit (INDEPENDENT_AMBULATORY_CARE_PROVIDER_SITE_OTHER): Payer: 59 | Admitting: Emergency Medicine

## 2014-07-23 ENCOUNTER — Encounter: Payer: Self-pay | Admitting: Emergency Medicine

## 2014-07-23 VITALS — BP 123/79 | HR 73 | Temp 97.3°F | Resp 16 | Ht 62.5 in | Wt 115.6 lb

## 2014-07-23 DIAGNOSIS — I10 Essential (primary) hypertension: Secondary | ICD-10-CM

## 2014-07-23 DIAGNOSIS — R51 Headache: Secondary | ICD-10-CM | POA: Diagnosis not present

## 2014-07-23 DIAGNOSIS — R413 Other amnesia: Secondary | ICD-10-CM

## 2014-07-23 DIAGNOSIS — E785 Hyperlipidemia, unspecified: Secondary | ICD-10-CM | POA: Diagnosis not present

## 2014-07-23 DIAGNOSIS — R519 Headache, unspecified: Secondary | ICD-10-CM

## 2014-07-23 DIAGNOSIS — G4700 Insomnia, unspecified: Secondary | ICD-10-CM | POA: Diagnosis not present

## 2014-07-23 LAB — LIPID PANEL
CHOL/HDL RATIO: 3.8 ratio
Cholesterol: 248 mg/dL — ABNORMAL HIGH (ref 0–200)
HDL: 65 mg/dL (ref >=46)
LDL CALC: 136 mg/dL — AB (ref 0–99)
Triglycerides: 233 mg/dL — ABNORMAL HIGH (ref <150)
VLDL: 47 mg/dL — ABNORMAL HIGH (ref 0–40)

## 2014-07-23 MED ORDER — BUTALBITAL-APAP-CAFFEINE 50-325-40 MG PO TABS: ORAL_TABLET | ORAL | Status: DC

## 2014-07-23 NOTE — Progress Notes (Addendum)
   Subjective:    Patient ID: Kristy Arias, female    DOB: 05/18/1950, 64 y.o.   MRN: [id number] This chart was scribed for Lesle ChrisSteven Daub, MD by Littie Deedsichard Sun, Medical Scribe. This patient was seen in room 23 and the patient's care was started at 9:27 AM.   HPI HPI Comments: Kristy CivatteDiane C Milone is a 64 y.o. female with a history of hyperlipidemia, memory loss, and chronic migraine who presents to the [facility] for a follow-up. Patient had a good visit with her neurologist for headaches. She was started on vitamin B12 and increased her nortriptyline to 50 mg a day. Her headaches have improved some.  Patient has been working on weaning down on Cymbalta from 30 mg to 20 mg. She has also reduced her Midol. She stopped the Crestor due to memory issues. She had been taking it 3 times a week previously. Patient is willing to try taking it again once a week. She started taking 1200 mg fish oil every day as of 2 weeks ago. She is also thinking about decreasing estrogen. Her 62 year old mother has been having some mental issues that started 2-3 years ago; she notes that currently, her mother sometimes gets things like times or meals mixed up.  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  Patient [place] two grandchildren in [place], [place] 36 and 4.  Review of Systems  Neurological: Positive for headaches.       Objective:   Physical Exam CONSTITUTIONAL: Well developed/well nourished HEAD: Normocephalic/atraumatic EYES: EOM/PERRL ENMT: Mucous membranes moist NECK: supple no meningeal signs SPINE: entire spine nontender CV: S1/S2 noted, no murmurs/rubs/gallops noted LUNGS: Lungs are clear to auscultation bilaterally, no apparent distress ABDOMEN: soft, nontender, no rebound or guarding GU: no cva tenderness NEURO: Pt is awake/alert, moves all extremitiesx4 EXTREMITIES: pulses normal, full ROM SKIN: warm, color  normal PSYCH: no abnormalities of mood noted     Assessment & Plan:  1. Hyperlipidemia She is willing to take the Crestor once a week - Lipid panel  2. Bilateral headaches She tried to increase her nortriptyline to 2 tablets at night but could not tolerate this. She was given a refill of her Fioricet  3. Memory difficulty She has been to the neurologist regarding her memory issues. She is very concerned because her mother has significant Alzheimer's disease. No medications indicated at this time  4. Essential hypertension She does have fibromuscular dysplasia but blood pressure is at goal  5. Insomnia No change in nortriptyline.   I personally performed the services described in this documentation, which was scribed in my presence. The recorded information has been reviewed and is accurate.  Lesle ChrisSteven Daub, MD  [facility]  07/23/2014 11:09 AM

## 2014-07-24 ENCOUNTER — Telehealth: Payer: Self-pay | Admitting: Family Medicine

## 2014-07-24 NOTE — Telephone Encounter (Signed)
-----   Message from Collene GobbleSteven A Daub, MD sent at 07/24/2014  8:55 AM EDT ----- Lipids are not good. Her last cholesterol was 189 cholesterol today is 248 her LDL has gone from 66-136. If she can try some statin that would be great

## 2014-07-24 NOTE — Telephone Encounter (Signed)
lmom to give the lab a call back

## 2014-07-25 ENCOUNTER — Encounter: Payer: Self-pay | Admitting: Radiology

## 2014-09-06 ENCOUNTER — Ambulatory Visit (INDEPENDENT_AMBULATORY_CARE_PROVIDER_SITE_OTHER): Payer: 59 | Admitting: Neurology

## 2014-09-06 ENCOUNTER — Encounter: Payer: Self-pay | Admitting: Neurology

## 2014-09-06 VITALS — BP 110/80 | HR 75 | Resp 16 | Ht 61.5 in | Wt 115.0 lb

## 2014-09-06 DIAGNOSIS — G43709 Chronic migraine without aura, not intractable, without status migrainosus: Secondary | ICD-10-CM

## 2014-09-06 DIAGNOSIS — R413 Other amnesia: Secondary | ICD-10-CM | POA: Diagnosis not present

## 2014-09-06 NOTE — Progress Notes (Signed)
NEUROLOGY FOLLOW UP OFFICE NOTE  Kristy Arias [id number]  HISTORY OF PRESENT ILLNESS: I had the pleasure of seeing Kristy Arias in follow-up in the neurology clinic on 09/06/2014.  The patient was last seen 3 months ago for worsening memory and headaches. MOCA score was normal 30/30. Records and images were personally reviewed where available.  I personally reviewed MRI brain which was normal. TSH normal, B12 low normal. She is now on B12 supplements and feels her energy level is improved. We had increased nortriptyline dose to 50mg  qhs, she did not feel much of a difference after taking it for a month, and reduced it back to 25mg  qhs, reporting that the headaches had improved, except the past few weeks due to significant sinus congestion. She has had the worst 2 headaches in the past week, and reports these are more sinus-related now. She has reduced intake of Excedrin and Fiorinal, she takes 1 Excedrin daily, and has noticed an improvement in headaches with this. She had attended an Alzheimer's event, where effects of medication on memory were discussed. She has now reduced antihistamine use, and takes Midol instead, which helps some. She had stopped her Crestor for a month, and repeat lipid levels were "ugly." She is working on being on a reduced dose. She reports she is more irritable. She asks about reducing Premarin and Cymbalta (she was taking this for mood). She denies any dizziness, diplopia, focal numbness/tingling/weakness, no falls. Nortriptyline 25mg  qhs does help with her sleep and urinary symptoms. She endorses stress at work and with her daughter changing jobs and her taking care of the grandchildren.  HPI: This is a 64 yo RH woman with a history of hypertension, hypothyroidism, hyperlipidemia, with headaches and memory changes.  1. Headaches. She reports that headaches started in her teenage years. She had seen a headache specialist in 2001 and was diagnosed with cluster or  migraine-type headaches and tried on different medications. This past Spring, she started having a different type of headache. She had tingling above her ears constantly, and between March and April 2016 she was having headaches daily for 5 weeks. At least 3 of these were associated with vomiting when she had severe throbbing pain. She states when she has sinus problems or allergies, she has stabbing pain behind one eye, however these were more concentrated in the middle of her forehead, with constant pressure. No visual obscurations, she had associated photo and phonophobia as well. She has an old prescription for nortriptyline for insomnia and bladder control, and started it 2-3 weeks ago. Since then, her headaches have quieted down at 25mg  dose. The tingling above her ears is still there. She had been taking either Fioricet or Excedrin 4 to 5 days of the week. She had cut back on Excedrin, but then stated she still takes it daily. There is a history of migraines in her mother, son, and daughter.   2. Memory. She started noticing memory changes 5 years ago. She states that she has not been great with short-term memory "all my life." She works as a Teacher, early years/pre, and has noticed that she cannot work with numbers as well. She has noticed word-finding difficulties for the past 2-3 years. Her husband has told her she repeats herself. She cannot recall what she went into a room for. She has always been "directionally-challenged" but denies getting lost driving. She had left the stove on once. Her husband is in charge of bills. When she has to take care of her  grandkids, she stops her Crestor, and notices some difference. However, even if she does not take it, she still cannot remember some things. She does the same with Restoril, feeling that it may contribute to memory changes as well. Her parents had some memory issues.   PAST MEDICAL HISTORY: Past Medical History  Diagnosis Date  . Anxiety   . Depression   .  Glaucoma   . Hyperlipidemia   . Heart murmur   . Hypertension   . Hypothyroidism   . Syndrome X, cardiac   . Shortness of breath     syndrome x  . Arthritis   . GERD (gastroesophageal reflux disease)   . Anemia     MEDICATIONS: Current Outpatient Prescriptions on File Prior to Visit  Medication Sig Dispense Refill  . acetaminophen-codeine (TYLENOL #3) 300-30 MG per tablet Take by mouth every 4 (four) hours as needed for moderate pain.    Marland Kitchen ALPRAZolam (XANAX) 0.5 MG tablet Take 0.5 mg by mouth 3 (three) times daily as needed for anxiety.    Marland Kitchen amLODipine (NORVASC) 5 MG tablet Take 1 tablet (5 mg total) by mouth daily. 90 tablet 3  . butalbital-acetaminophen-caffeine (FIORICET) 50-325-40 MG per tablet 1 tablet every 4-6 hours as needed for headache 30 tablet 3  . Cyanocobalamin (VITAMIN B-12 PO) Take by mouth.    . diclofenac (VOLTAREN) 75 MG EC tablet Take 1 tablet (75 mg total) by mouth 2 (two) times daily. 180 tablet 3  . DULoxetine (CYMBALTA) 30 MG capsule Take 1 capsule (30 mg total) by mouth at bedtime. 30 capsule 11  . estrogens, conjugated, (PREMARIN) 0.9 MG tablet Take 0.9 mg by mouth daily. Take daily for 21 days then do not take for 7 days.    . Ibuprofen (MIDOL PO) Take by mouth.    . levothyroxine (SYNTHROID, LEVOTHROID) 50 MCG tablet Take one tablet daily 30 tablet 11  . lipase/protease/amylase (CREON) 12000 UNITS CPEP capsule Take 12,000 Units by mouth 3 (three) times a week.    . losartan (COZAAR) 25 MG tablet Take 1 tablet (25 mg total) by mouth daily. Take 1 tablet by mouth daily 90 tablet 3  . nortriptyline (PAMELOR) 25 MG capsule Take 1 capsule (25 mg total) by mouth 2 (two) times daily. (Patient taking differently: Take 25 mg by mouth at bedtime. ) 180 capsule 3  . rosuvastatin (CRESTOR) 5 MG tablet Take 1 tablet (5 mg total) by mouth at bedtime. 30 tablet 11  . temazepam (RESTORIL) 30 MG capsule Take 30 mg by mouth at bedtime as needed for sleep.    Marland Kitchen timolol  (BETIMOL) 0.5 % ophthalmic solution Place 1 drop into both eyes 2 (two) times daily.    . Travoprost, BAK Free, (TRAVATAN) 0.004 % SOLN ophthalmic solution Place 1 drop into both eyes at bedtime.     . dicyclomine (BENTYL) 20 MG tablet Take 1 tablet (20 mg total) by mouth 2 (two) times daily as needed (gallbladder issues). (Patient not taking: Reported on 09/06/2014) 90 tablet 3   No current facility-administered medications on file prior to visit.    ALLERGIES: No Known Allergies  FAMILY HISTORY: Family History  Problem Relation Age of Onset  . Hypertension Father   . Diabetes Father   . Heart disease Father   . Hyperlipidemia Father   . Hypertension Maternal Grandmother   . Heart disease Maternal Grandmother   . Diabetes Maternal Grandfather   . Hyperlipidemia Maternal Grandfather   . Heart disease Maternal Grandfather   .  Osteoporosis Paternal Grandmother   . Heart disease Paternal Grandmother   . Heart disease Paternal Grandfather   . Cancer Mother   . Hyperlipidemia Mother   . Hypertension Mother   . Cancer Sister     SOCIAL HISTORY: Social History   Social History  . Marital Status: Married    Spouse Name: N/A  . Number of Children: N/A  . Years of Education: N/A   Occupational History  . Not on file.   Social History Main Topics  . Smoking status: Never Smoker   . Smokeless tobacco: Never Used  . Alcohol Use: No  . Drug Use: No  . Sexual Activity: Yes    Birth Control/ Protection: Surgical   Other Topics Concern  . Not on file   Social History Narrative    REVIEW OF SYSTEMS: Constitutional: No fevers, chills, or sweats, no generalized fatigue, change in appetite Eyes: No visual changes, double vision, eye pain Ear, nose and throat: No hearing loss, ear pain, nasal congestion, sore throat Cardiovascular: No chest pain, palpitations Respiratory:  No shortness of breath at rest or with exertion, wheezes GastrointestinaI: No nausea, vomiting, diarrhea,  abdominal pain, fecal incontinence Genitourinary:  No dysuria, urinary retention or frequency Musculoskeletal:  No neck pain, back pain Integumentary: No rash, pruritus, skin lesions Neurological: as above Psychiatric: No depression, insomnia, anxiety Endocrine: No palpitations, fatigue, diaphoresis, mood swings, change in appetite, change in weight, increased thirst Hematologic/Lymphatic:  No anemia, purpura, petechiae. Allergic/Immunologic: no itchy/runny eyes, nasal congestion, recent allergic reactions, rashes  PHYSICAL EXAM: Filed Vitals:   09/06/14 0833  BP: 110/80  Pulse: 75  Resp: 16   General: No acute distress Head:  Normocephalic/atraumatic Neck: supple, no paraspinal tenderness, full range of motion Heart:  Regular rate and rhythm Lungs:  Clear to auscultation bilaterally Back: No paraspinal tenderness Skin/Extremities: No rash, no edema Neurological Exam: alert and oriented to person, place, and time. No aphasia or dysarthria. Fund of knowledge is appropriate.  Recent and remote memory are intact. 3/3 delayed recall.  Attention and concentration are normal.    Able to name objects and repeat phrases. Cranial nerves: Pupils equal, round, reactive to light.  Extraocular movements intact with no nystagmus. Visual fields full. Facial sensation intact. No facial asymmetry. Tongue, uvula, palate midline.  Motor: Bulk and tone normal, muscle strength 5/5 throughout with no pronator drift.  Sensation to light touch intact.  No extinction to double simultaneous stimulation.  Deep tendon reflexes 2+ throughout, toes downgoing.  Finger to nose testing intact.  Gait narrow-based and steady, able to tandem walk adequately.  Romberg negative.  IMPRESSION: This is a 64 yo RH woman with a history of hypertension, hypothyroidism, who presented with worsening headaches and memory changes. MOCA in May 2016 was normal 30/30. She reports headaches had improved some with nortriptyline, but  increased again recently due to sinus congestion. She will continue on nortriptyline  qhs, did not notice much difference on higher dose. We discussed other options for headache prophylaxis, including beta-blockers such as Atenolol and Propranolol. She will discuss this with Dr. Cleta Alberts, to consider switching her other BP medications. We again discussed minimizing Excedrin and Fioricet to 2-3 a week to avoid rebound headaches. We discussed memory issues, MRI brain normal. Stress may be contributing. She would benefit from Neuropsychological evaluation to further delineate her symptoms. She is undergoing a lot of stress until the next 2 months, and will call our office once she is ready for this. We again discussed  the importance of physical exercise and brain stimulation exercises for brain health. She will follow-up in 4 months, after Neuropsych eval, and knows to call our office for any changes.   Thank you for allowing me to participate in her care.  Please do not hesitate to call for any questions or concerns.  The duration of this appointment visit was 24 minutes of face-to-face time with the patient.  Greater than 50% of this time was spent in counseling, explanation of diagnosis, planning of further management, and coordination of care.   Patrcia Dolly, M.D.   CC: Dr. Cleta Alberts

## 2014-09-06 NOTE — Patient Instructions (Signed)
1. Discuss with PCP switching BP medication to a beta-blocker such as Atenolol or Propranolol for headache prevention as well as BP control 2. Call our office once your are ready for Neuropsychological evaluation and we will send referral to [place] 3. Physical exercise and brain stimulation exercises are important for brain health 4. Follow-up in 3-4 months (after Neuropsych testing), call for any changes

## 2014-10-22 ENCOUNTER — Encounter: Payer: Self-pay | Admitting: Emergency Medicine

## 2014-10-22 ENCOUNTER — Ambulatory Visit (INDEPENDENT_AMBULATORY_CARE_PROVIDER_SITE_OTHER): Payer: 59 | Admitting: Emergency Medicine

## 2014-10-22 VITALS — BP 120/77 | HR 71 | Temp 98.1°F | Resp 16 | Ht 62.0 in | Wt 118.0 lb

## 2014-10-22 DIAGNOSIS — Z23 Encounter for immunization: Secondary | ICD-10-CM

## 2014-10-22 DIAGNOSIS — R51 Headache: Secondary | ICD-10-CM

## 2014-10-22 DIAGNOSIS — E785 Hyperlipidemia, unspecified: Secondary | ICD-10-CM | POA: Diagnosis not present

## 2014-10-22 DIAGNOSIS — R519 Headache, unspecified: Secondary | ICD-10-CM

## 2014-10-22 DIAGNOSIS — I10 Essential (primary) hypertension: Secondary | ICD-10-CM

## 2014-10-22 DIAGNOSIS — E038 Other specified hypothyroidism: Secondary | ICD-10-CM

## 2014-10-22 LAB — COMPLETE METABOLIC PANEL WITH GFR
ALT: 10 U/L (ref 6–29)
AST: 12 U/L (ref 10–35)
Albumin: 3.5 g/dL — ABNORMAL LOW (ref 3.6–5.1)
Alkaline Phosphatase: 83 U/L (ref 33–130)
BILIRUBIN TOTAL: 0.4 mg/dL (ref 0.2–1.2)
BUN: 13 mg/dL (ref 7–25)
CO2: 25 mmol/L (ref 20–31)
CREATININE: 0.86 mg/dL (ref 0.50–0.99)
Calcium: 9.2 mg/dL (ref 8.6–10.4)
Chloride: 105 mmol/L (ref 98–110)
GFR, Est African American: 84 mL/min (ref >=60)
GFR, Est Non African American: 73 mL/min (ref >=60)
Glucose, Bld: 87 mg/dL (ref 65–99)
Potassium: 4.1 mmol/L (ref 3.5–5.3)
SODIUM: 138 mmol/L (ref 135–146)
Total Protein: 6.3 g/dL (ref 6.1–8.1)

## 2014-10-22 LAB — LIPID PANEL
Cholesterol: 194 mg/dL (ref 125–200)
HDL: 63 mg/dL (ref >=46)
LDL Cholesterol: 80 mg/dL (ref <130)
Total CHOL/HDL Ratio: 3.1 Ratio (ref <=5.0)
Triglycerides: 254 mg/dL — ABNORMAL HIGH (ref <150)
VLDL: 51 mg/dL — ABNORMAL HIGH (ref <30)

## 2014-10-22 LAB — CBC WITH DIFFERENTIAL/PLATELET
BASOS ABS: 0 10*3/uL (ref 0.0–0.1)
BASOS PCT: 0 % (ref 0–1)
Eosinophils Absolute: 0.1 10*3/uL (ref 0.0–0.7)
Eosinophils Relative: 1 % (ref 0–5)
HCT: 36.9 % (ref 36.0–46.0)
Hemoglobin: 12.6 g/dL (ref 12.0–15.0)
LYMPHS ABS: 1.9 10*3/uL (ref 0.7–4.0)
Lymphocytes Relative: 29 % (ref 12–46)
MCH: 32.3 pg (ref 26.0–34.0)
MCHC: 34.1 g/dL (ref 30.0–36.0)
MCV: 94.6 fL (ref 78.0–100.0)
MPV: 9.1 fL (ref 8.6–12.4)
Monocytes Absolute: 0.7 10*3/uL (ref 0.1–1.0)
Monocytes Relative: 11 % (ref 3–12)
NEUTROS ABS: 4 10*3/uL (ref 1.7–7.7)
NEUTROS PCT: 59 % (ref 43–77)
Platelets: 317 10*3/uL (ref 150–400)
RBC: 3.9 MIL/uL (ref 3.87–5.11)
RDW: 13.3 % (ref 11.5–15.5)
WBC: 6.7 10*3/uL (ref 4.0–10.5)

## 2014-10-22 NOTE — Progress Notes (Signed)
Patient ID: Kristy Arias, female   DOB: 05/15/1950, 64 y.o.   MRN: [id number]     This chart was scribed for Lesle Chris, MD by Littie Deeds, Medical Scribe. This patient was seen in room 21 and the patient's care was started at 10:16 AM.   Chief Complaint:  Chief Complaint  Patient presents with  . Headache  . Hyperlipidemia  . Hypertension    HPI: Kristy Arias is a 64 y.o. female with a history of hypertension, hyperlipidemia, and chronic migraine who reports to [facility] today for a follow-up. Patient has been doing relatively well. 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   Her headaches have been improving. She does not think her memory has improved any. Dr. Karel Jarvis wanted her to have some memory testing in [place], but she has been pushing this back. She has been seeing Dr. Zachery Dauer for her knees. She saw her GI doctor this past spring.  Patient has been taking her cholesterol 3-4 times a week.  Past Medical History  Diagnosis Date  . Anxiety   . Depression   . Glaucoma   . Hyperlipidemia   . Heart murmur   . Hypertension   . Hypothyroidism   . Syndrome X, cardiac   . Shortness of breath     syndrome x  . Arthritis   . GERD (gastroesophageal reflux disease)   . Anemia    Past Surgical History  Procedure Laterality Date  . Breast enhancement surgery    . Breast surgery    . Cosmetic surgery    . Dilation and curettage of uterus  2013  . Cardiac catheterization  4/14  . Laparoscopic cholecystectomy single port N/A 06/29/2012    Procedure: LAPAROSCOPIC CHOLECYSTECTOMY SINGLE PORT IOC ;  Surgeon: Ardeth Sportsman, MD;  Location: [facility] ORS;  Service: General;  Laterality: N/A;  . Cholecystectomy    . Left heart catheterization with coronary angiogram N/A 05/23/2012    Procedure: LEFT HEART CATHETERIZATION WITH CORONARY ANGIOGRAM;  Surgeon: [name], MD;  Location: [facility] CATH LAB;  Service: Cardiovascular;  Laterality: N/A;   Social History    Social History  . Marital Status: Married    Spouse Name: N/A  . Number of Children: N/A  . Years of Education: N/A   Social History Main Topics  . Smoking status: Never Smoker   . Smokeless tobacco: Never Used  . Alcohol Use: No  . Drug Use: No  . Sexual Activity: Yes    Birth Control/ Protection: Surgical   Other Topics Concern  . None   Social History Narrative   Family History  Problem Relation Age of Onset  . Hypertension Father   . Diabetes Father   . Heart disease Father   . Hyperlipidemia Father   . Hypertension Maternal Grandmother   . Heart disease Maternal Grandmother   . Diabetes Maternal Grandfather   . Hyperlipidemia Maternal Grandfather   . Heart disease Maternal Grandfather   . Osteoporosis Paternal Grandmother   . Heart disease Paternal Grandmother   . Heart disease Paternal Grandfather   . Cancer Mother   . Hyperlipidemia Mother   . Hypertension Mother   . Cancer Sister    No Known Allergies Prior to Admission medications   Medication Sig Start Date End Date Taking? Authorizing Provider  acetaminophen-codeine (TYLENOL #3) 300-30 MG per tablet Take by mouth every 4 (four) hours as needed for moderate pain.    Historical Provider, MD  ALPRAZolam (XANAX) 0.5 MG tablet Take 0.5 mg by mouth 3 (three) times daily as needed for anxiety.    Historical Provider, MD  amLODipine (NORVASC) 5 MG tablet Take 1 tablet (5 mg total) by mouth daily. 12/22/13   Collene Gobble, MD  butalbital-acetaminophen-caffeine [facility]) [phone] MG per tablet 1 tablet every 4-6 hours as needed for headache 07/23/14   Collene Gobble, MD  Cyanocobalamin (VITAMIN B-12 PO) Take by mouth.    Historical Provider, MD  diclofenac (VOLTAREN) 75 MG EC tablet Take 1 tablet (75 mg total) by mouth 2 (two) times daily. 12/22/13   Collene Gobble, MD  dicyclomine (BENTYL) 20 MG tablet Take 1 tablet (20 mg total) by mouth 2 (two) times daily as needed (gallbladder issues). Patient not taking:  Reported on 09/06/2014 12/22/13   Collene Gobble, MD  DULoxetine (CYMBALTA) 30 MG capsule Take 1 capsule (30 mg total) by mouth at bedtime. 12/22/13   Collene Gobble, MD  estrogens, conjugated, (PREMARIN) 0.9 MG tablet Take 0.9 mg by mouth daily. Take daily for 21 days then do not take for 7 days.    Historical Provider, MD  Ibuprofen (MIDOL PO) Take by mouth.    Historical Provider, MD  levothyroxine (SYNTHROID, LEVOTHROID) 50 MCG tablet Take one tablet daily 12/22/13   Collene Gobble, MD  lipase/protease/amylase (CREON) 12000 UNITS CPEP capsule Take 12,000 Units by mouth 3 (three) times a week.    Historical Provider, MD  losartan (COZAAR) 25 MG tablet Take 1 tablet (25 mg total) by mouth daily. Take 1 tablet by mouth daily 12/22/13   Collene Gobble, MD  nortriptyline (PAMELOR) 25 MG capsule Take 1 capsule (25 mg total) by mouth 2 (two) times daily. Patient taking differently: Take 25 mg by mouth at bedtime.  12/22/13   Collene Gobble, MD  rosuvastatin (CRESTOR) 5 MG tablet Take 1 tablet (5 mg total) by mouth at bedtime. 12/22/13   Collene Gobble, MD  temazepam (RESTORIL) 30 MG capsule Take 30 mg by mouth at bedtime as needed for sleep.    Historical Provider, MD  timolol (BETIMOL) 0.5 % ophthalmic solution Place 1 drop into both eyes 2 (two) times daily.    Historical Provider, MD  Travoprost, BAK Free, (TRAVATAN) 0.004 % SOLN ophthalmic solution Place 1 drop into both eyes at bedtime.     Historical Provider, MD     ROS: The patient denies fevers, chills, night sweats, unintentional weight loss, chest pain, palpitations, wheezing, dyspnea on exertion, nausea, vomiting, abdominal pain, dysuria, hematuria, melena, numbness, weakness, or tingling.   All other systems have been reviewed and were otherwise negative with the exception of those mentioned in the HPI and as above.    PHYSICAL EXAM: Filed Vitals:   10/22/14 1006  BP: 120/77  Pulse: 71  Temp: 98.1 F (36.7 C)  Resp: 16   Body mass  index is 21.58 kg/(m^2).   General: Alert, no acute distress HEENT:  Normocephalic, atraumatic, oropharynx patent. Eye: Nonie Hoyer [facility] Cardiovascular:  Regular rate and rhythm, no rubs murmurs or gallops.  No Carotid bruits, radial pulse intact. No pedal edema.  Respiratory: Clear to auscultation bilaterally.  No wheezes, rales, or rhonchi.  No cyanosis, no use of accessory musculature Abdominal: No organomegaly, abdomen is soft and non-tender, positive bowel sounds.  No masses. Musculoskeletal: Gait intact. No edema, tenderness Skin: No rashes. Neurologic: Facial musculature symmetric. Psychiatric: Patient acts appropriately throughout our interaction. Lymphatic: No cervical or submandibular lymphadenopathy  Genitourinary/Anorectal: No acute findings    LABS:    EKG/XRAY:   Primary read interpreted by Dr. Cleta Alberts at [facility].   ASSESSMENT/PLAN: Medical problems are stable at the present time. There will be no changes in medication. She is taking her statin about 3 days a week. We'll see what her cholesterol is.  By signing my name below, I, Littie Deeds, attest that this documentation has been prepared under the direction and in the presence of Lesle Chris, MD.  Electronically Signed: Littie Deeds, Medical Scribe. 10/22/2014. 10:34 AM.  Michaell Cowing sideeffects, risk and benefits, and alternatives of medications d/w patient. Patient is aware that all medications have potential sideeffects and we are unable to predict every sideeffect or drug-drug interaction that may occur.  Lesle Chris MD 10/22/2014 10:34 AM

## 2014-10-29 ENCOUNTER — Encounter: Payer: Self-pay | Admitting: Emergency Medicine

## 2014-11-26 ENCOUNTER — Other Ambulatory Visit: Payer: Self-pay | Admitting: Emergency Medicine

## 2014-12-02 ENCOUNTER — Other Ambulatory Visit: Payer: Self-pay | Admitting: Emergency Medicine

## 2014-12-10 ENCOUNTER — Telehealth: Payer: Self-pay

## 2014-12-10 MED ORDER — LEVOTHYROXINE SODIUM 50 MCG PO TABS: 50.0000 ug | ORAL_TABLET | Freq: Every day | ORAL | Status: DC

## 2014-12-10 MED ORDER — LOSARTAN POTASSIUM 25 MG PO TABS: 25.0000 mg | ORAL_TABLET | Freq: Every day | ORAL | Status: DC

## 2014-12-10 MED ORDER — AMLODIPINE BESYLATE 5 MG PO TABS: 5.0000 mg | ORAL_TABLET | Freq: Every day | ORAL | Status: DC

## 2014-12-10 MED ORDER — DICLOFENAC SODIUM 75 MG PO TBEC: DELAYED_RELEASE_TABLET | ORAL | Status: DC

## 2014-12-10 MED ORDER — NORTRIPTYLINE HCL 25 MG PO CAPS: 25.0000 mg | ORAL_CAPSULE | Freq: Every day | ORAL | Status: DC

## 2014-12-10 NOTE — Telephone Encounter (Signed)
Pt needs to talk with someone about the call in of all her medications for the whole year this was supposed to be done June 28th but it was not   Best number [phone]

## 2014-12-10 NOTE — Telephone Encounter (Signed)
Rx's pended. Please advise on Noratriptyline.

## 2015-01-28 ENCOUNTER — Ambulatory Visit (INDEPENDENT_AMBULATORY_CARE_PROVIDER_SITE_OTHER): Payer: 59 | Admitting: Emergency Medicine

## 2015-01-28 ENCOUNTER — Encounter: Payer: Self-pay | Admitting: Emergency Medicine

## 2015-01-28 VITALS — BP 139/84 | HR 71 | Temp 97.7°F | Resp 16 | Ht 62.0 in | Wt 120.0 lb

## 2015-01-28 DIAGNOSIS — E038 Other specified hypothyroidism: Secondary | ICD-10-CM | POA: Diagnosis not present

## 2015-01-28 DIAGNOSIS — E785 Hyperlipidemia, unspecified: Secondary | ICD-10-CM

## 2015-01-28 DIAGNOSIS — Z13820 Encounter for screening for osteoporosis: Secondary | ICD-10-CM | POA: Diagnosis not present

## 2015-01-28 DIAGNOSIS — Z23 Encounter for immunization: Secondary | ICD-10-CM | POA: Diagnosis not present

## 2015-01-28 DIAGNOSIS — R413 Other amnesia: Secondary | ICD-10-CM | POA: Diagnosis not present

## 2015-01-28 DIAGNOSIS — R519 Headache, unspecified: Secondary | ICD-10-CM

## 2015-01-28 DIAGNOSIS — Z1159 Encounter for screening for other viral diseases: Secondary | ICD-10-CM | POA: Diagnosis not present

## 2015-01-28 DIAGNOSIS — R51 Headache: Secondary | ICD-10-CM

## 2015-01-28 DIAGNOSIS — Z114 Encounter for screening for human immunodeficiency virus [HIV]: Secondary | ICD-10-CM

## 2015-01-28 DIAGNOSIS — G4700 Insomnia, unspecified: Secondary | ICD-10-CM | POA: Diagnosis not present

## 2015-01-28 DIAGNOSIS — I10 Essential (primary) hypertension: Secondary | ICD-10-CM

## 2015-01-28 DIAGNOSIS — Z0000 Encounter for general adult medical examination without abnormal findings: Secondary | ICD-10-CM

## 2015-01-28 LAB — LIPID PANEL
CHOL/HDL RATIO: 2.9 ratio (ref <=5.0)
CHOLESTEROL: 220 mg/dL — AB (ref 125–200)
HDL: 75 mg/dL (ref >=46)
LDL Cholesterol: 93 mg/dL (ref <130)
TRIGLYCERIDES: 259 mg/dL — AB (ref <150)
VLDL: 52 mg/dL — ABNORMAL HIGH (ref <30)

## 2015-01-28 LAB — COMPLETE METABOLIC PANEL WITH GFR
ALT: 10 U/L (ref 6–29)
AST: 13 U/L (ref 10–35)
Albumin: 3.3 g/dL — ABNORMAL LOW (ref 3.6–5.1)
Alkaline Phosphatase: 63 U/L (ref 33–130)
BUN: 8 mg/dL (ref 7–25)
CO2: 28 mmol/L (ref 20–31)
CREATININE: 0.78 mg/dL (ref 0.50–0.99)
Calcium: 8.8 mg/dL (ref 8.6–10.4)
Chloride: 99 mmol/L (ref 98–110)
GFR, EST NON AFRICAN AMERICAN: 82 mL/min (ref >=60)
Glucose, Bld: 82 mg/dL (ref 65–99)
Potassium: 3.8 mmol/L (ref 3.5–5.3)
Sodium: 135 mmol/L (ref 135–146)
Total Bilirubin: 0.3 mg/dL (ref 0.2–1.2)
Total Protein: 6.1 g/dL (ref 6.1–8.1)

## 2015-01-28 LAB — CBC WITH DIFFERENTIAL/PLATELET
BASOS PCT: 1 % (ref 0–1)
Basophils Absolute: 0.1 10*3/uL (ref 0.0–0.1)
Eosinophils Absolute: 0.1 10*3/uL (ref 0.0–0.7)
Eosinophils Relative: 1 % (ref 0–5)
HEMATOCRIT: 38.9 % (ref 36.0–46.0)
Hemoglobin: 13.2 g/dL (ref 12.0–15.0)
LYMPHS ABS: 2.7 10*3/uL (ref 0.7–4.0)
LYMPHS PCT: 32 % (ref 12–46)
MCH: 32.4 pg (ref 26.0–34.0)
MCHC: 33.9 g/dL (ref 30.0–36.0)
MCV: 95.3 fL (ref 78.0–100.0)
MONOS PCT: 10 % (ref 3–12)
MPV: 9.1 fL (ref 8.6–12.4)
Monocytes Absolute: 0.8 10*3/uL (ref 0.1–1.0)
NEUTROS ABS: 4.6 10*3/uL (ref 1.7–7.7)
NEUTROS PCT: 56 % (ref 43–77)
Platelets: 412 10*3/uL — ABNORMAL HIGH (ref 150–400)
RBC: 4.08 MIL/uL (ref 3.87–5.11)
RDW: 13.8 % (ref 11.5–15.5)
WBC: 8.3 10*3/uL (ref 4.0–10.5)

## 2015-01-28 LAB — POCT URINALYSIS DIP (MANUAL ENTRY)
BILIRUBIN UA: NEGATIVE
Glucose, UA: NEGATIVE
Ketones, POC UA: NEGATIVE
Leukocytes, UA: NEGATIVE
NITRITE UA: NEGATIVE
PH UA: 6
PROTEIN UA: NEGATIVE
Spec Grav, UA: 1.02
Urobilinogen, UA: 0.2

## 2015-01-28 LAB — POC MICROSCOPIC URINALYSIS (UMFC): MUCUS RE: ABSENT

## 2015-01-28 LAB — VITAMIN D 25 HYDROXY (VIT D DEFICIENCY, FRACTURES): Vit D, 25-Hydroxy: 39 ng/mL (ref 30–100)

## 2015-01-28 LAB — HIV ANTIBODY (ROUTINE TESTING W REFLEX): HIV 1&2 Ab, 4th Generation: NONREACTIVE

## 2015-01-28 LAB — TSH: TSH: 4.421 u[IU]/mL (ref 0.350–4.500)

## 2015-01-28 LAB — HEPATITIS C ANTIBODY: HCV AB: NEGATIVE

## 2015-01-28 MED ORDER — AMLODIPINE BESYLATE 10 MG PO TABS: 10.0000 mg | ORAL_TABLET | Freq: Every day | ORAL | Status: DC

## 2015-01-28 MED ORDER — LOSARTAN POTASSIUM 25 MG PO TABS: 25.0000 mg | ORAL_TABLET | Freq: Every day | ORAL | Status: DC

## 2015-01-28 MED ORDER — TEMAZEPAM 30 MG PO CAPS: 30.0000 mg | ORAL_CAPSULE | Freq: Every evening | ORAL | Status: DC | PRN

## 2015-01-28 MED ORDER — NORTRIPTYLINE HCL 25 MG PO CAPS: 25.0000 mg | ORAL_CAPSULE | Freq: Two times a day (BID) | ORAL | Status: DC

## 2015-01-28 MED ORDER — DICLOFENAC SODIUM 75 MG PO TBEC: DELAYED_RELEASE_TABLET | ORAL | Status: DC

## 2015-01-28 MED ORDER — ROSUVASTATIN CALCIUM 5 MG PO TABS: 5.0000 mg | ORAL_TABLET | Freq: Every day | ORAL | Status: DC

## 2015-01-28 MED ORDER — LEVOTHYROXINE SODIUM 50 MCG PO TABS: 50.0000 ug | ORAL_TABLET | Freq: Every day | ORAL | Status: DC

## 2015-01-28 MED ORDER — ALPRAZOLAM 0.5 MG PO TABS: 0.5000 mg | ORAL_TABLET | Freq: Every evening | ORAL | Status: DC | PRN

## 2015-01-28 NOTE — Progress Notes (Addendum)
Subjective:  This chart was scribed for Collene GobbleSteven A Jonee Lamore, MD by Veverly FellsHatice Demirci,scribe, at [facility].  This patient was seen in room 22 and the patient's care was started at 8:41 AM.    Patient ID: Kristy Civatteiane C Kortz, female    DOB: 05/26/1950, 65 y.o.   MRN: [id number] Chief Complaint  Patient presents with  . Annual Exam    HPI  HPI Comments: Kristy CivatteDiane C Arias is a 65 y.o. female who presents to the [facility] for an annual physical exam.   Ear pain/chin: Patient had a cold close to three weeks ago and woke up with slight left ear pain this past week ago which she would like to have it checked out.  She states that it feels like she has slightly lost hearing out of it but doesn't think its anything that severe.  She is also feeling a "popping" on the underside of her chin when she yawns. She will be going to the dentist next week and is willing to have her chin x-rayed and ask her dentist what he thinks.   Headache: Patient states that her headaches are much better (since May 2015) but they are still persistent.  Knee pain: She will be getting her knees checked out next week and was told that it was "time to do something".  She states that she is not able to walk fast and is deciding on whether to have a total knee replacement or an arthroscopic procedure.   Constipation/ Diarrhea : She is still having constipation and diarrhea intermittently and is unable to eat certain foods like ham or butter.   Cardiology: Patient has "sydrome X" and has a check up in a couple of months.  She has had an ultrasound on her carotids close to three years ago and was told that there is nothing to worry about.   Vaccinations: She is up to date with her shingles shot and is due for a tetanus shot.   Medication: She would like a refill for her prescriptions today.   Patient has an obgyn.     Patient Active Problem List   Diagnosis Date Noted  . Memory loss 06/05/2014  .  Chronic migraine without aura without status migrainosus, not intractable 06/05/2014  . Worsening headaches 06/05/2014  . Chronic cholecystitis with calculus s/p lap chole 06/29/2012 04/13/2012  . Abdominal pain, epigastric 04/13/2012  . Heartburn 04/13/2012  . Hypertension 11/02/2011  . Fibromuscular dysplasia (HCC) 11/02/2011  . Hyperlipidemia 11/02/2011  . Allergic rhinitis 11/02/2011  . Arthritis 11/02/2011  . Depression 11/02/2011   Past Medical History  Diagnosis Date  . Anxiety   . Depression   . Glaucoma   . Hyperlipidemia   . Heart murmur   . Hypertension   . Hypothyroidism   . Syndrome X, cardiac (HCC)   . Shortness of breath     syndrome x  . Arthritis   . GERD (gastroesophageal reflux disease)   . Anemia    Past Surgical History  Procedure Laterality Date  . Breast enhancement surgery    . Breast surgery    . Cosmetic surgery    . Dilation and curettage of uterus  2013  . Cardiac catheterization  4/14  . Laparoscopic cholecystectomy single port N/A 06/29/2012    Procedure: LAPAROSCOPIC CHOLECYSTECTOMY SINGLE PORT IOC ;  Surgeon: Ardeth SportsmanSteven C. Gross, MD;  Location: [facility];  Service: General;  Laterality: N/A;  . Cholecystectomy    .  Left heart catheterization with coronary angiogram N/A 05/23/2012    Procedure: LEFT HEART CATHETERIZATION WITH CORONARY ANGIOGRAM;  Surgeon: [name], MD;  Location: [facility] CATH LAB;  Service: Cardiovascular;  Laterality: N/A;   No Known Allergies Prior to Admission medications   Medication Sig Start Date End Date Taking? Authorizing Provider  ALPRAZolam Prudy Feeler) 0.5 MG tablet Take 0.5 mg by mouth 3 (three) times daily as needed for anxiety.    Historical Provider, MD  amLODipine (NORVASC) 5 MG tablet Take 1 tablet (5 mg total) by mouth daily. 12/10/14   Collene Gobble, MD  butalbital-acetaminophen-caffeine [facility]) [phone] MG per tablet 1 tablet every 4-6 hours as needed for headache 07/23/14   Collene Gobble, MD  Cyanocobalamin  (VITAMIN B-12 PO) Take by mouth.    Historical Provider, MD  diclofenac (VOLTAREN) 75 MG EC tablet TAKE 1 TABLET BY MOUTH TWICE DAY 12/10/14   Collene Gobble, MD  dicyclomine (BENTYL) 20 MG tablet Take 1 tablet (20 mg total) by mouth 2 (two) times daily as needed (gallbladder issues). 12/22/13   Collene Gobble, MD  DULoxetine (CYMBALTA) 30 MG capsule Take 1 capsule (30 mg total) by mouth at bedtime. 12/22/13   Collene Gobble, MD  estrogens, conjugated, (PREMARIN) 0.9 MG tablet Take 0.9 mg by mouth daily. Take daily for 21 days then do not take for 7 days.    Historical Provider, MD  Ibuprofen (MIDOL PO) Take by mouth.    Historical Provider, MD  levothyroxine (SYNTHROID, LEVOTHROID) 50 MCG tablet Take 1 tablet (50 mcg total) by mouth daily. 12/10/14   Collene Gobble, MD  lipase/protease/amylase (CREON) 12000 UNITS CPEP capsule Take 12,000 Units by mouth 3 (three) times a week.    Historical Provider, MD  losartan (COZAAR) 25 MG tablet Take 1 tablet (25 mg total) by mouth daily. Take 1 tablet by mouth daily 12/10/14   Collene Gobble, MD  nortriptyline (PAMELOR) 25 MG capsule Take 1 capsule (25 mg total) by mouth at bedtime. 12/10/14   Collene Gobble, MD  rosuvastatin (CRESTOR) 5 MG tablet Take 1 tablet (5 mg total) by mouth at bedtime. 12/22/13   Collene Gobble, MD  temazepam (RESTORIL) 30 MG capsule Take 30 mg by mouth at bedtime as needed for sleep.    Historical Provider, MD  timolol (BETIMOL) 0.5 % ophthalmic solution Place 1 drop into both eyes 2 (two) times daily.    Historical Provider, MD  Travoprost, BAK Free, (TRAVATAN) 0.004 % SOLN ophthalmic solution Place 1 drop into both eyes at bedtime.     Historical Provider, MD   Social History   Social History  . Marital Status: Married    Spouse Name: N/A  . Number of Children: N/A  . Years of Education: N/A   Occupational History  . Not on file.   Social History Main Topics  . Smoking status: Never Smoker   . Smokeless tobacco: Never Used  .  Alcohol Use: No  . Drug Use: No  . Sexual Activity: Yes    Birth Control/ Protection: Surgical   Other Topics Concern  . Not on file   Social History Narrative     Review of Systems  Constitutional: Negative for fever and chills.  HENT: Positive for ear pain.   Eyes: Negative for pain, redness and itching.  Respiratory: Negative for cough, choking and shortness of breath.   Gastrointestinal: Positive for diarrhea and constipation. Negative for nausea and vomiting.  Musculoskeletal: Positive  for arthralgias. Negative for neck pain and neck stiffness.  Skin: Negative for color change.  Neurological: Positive for headaches. Negative for syncope and speech difficulty.       Objective:   Physical Exam  CONSTITUTIONAL: Well developed/well nourished HEAD: Normocephalic/atraumatic EYES: EOMI/PERRL ENMT: Mucous membranes moist, No definite mass over the tip of the chin. Left ear:  mild dulness, no fluid or redness.  NECK: supple no meningeal signs,  SPINE/BACK:entire spine nontender CV: S1/S2 noted, no murmurs/rubs/gallops noted, Faint carotid bruit on the right.  CHEST: She has had bilateral breast augmentation.  LUNGS: Lungs are clear to auscultation bilaterally, no apparent distress ABDOMEN: soft, nontender, no rebound or guarding, bowel sounds noted throughout abdomen GU:no cva tenderness NEURO: Pt is awake/alert/appropriate, moves all extremitiesx4.  No facial droop.   EXTREMITIES: pulses normal/equal, full ROM SKIN: warm, color normal PSYCH: no abnormalities of mood noted, alert and oriented to situation    Filed Vitals:   01/28/15 0826 01/28/15 0827  BP: 156/84 139/84  Pulse: 70 71  Temp: 97.7 F (36.5 C)   Resp: 16   Height: 5\' 2"  (1.575 m)   Weight: 120 lb (54.432 kg)        Assessment & Plan:   1. Annual physical exam Physical exam is unchanged from previous she does have carotid bruits which are followed by Dr. Sherril Croon last carotid study was done 1-1/2 years  ago and was completely normal. - POC Hemoccult Bld/Stl (3-Cd Home Screen); Future - CBC with Differential/Platelet  2. Hyperlipidemia She does not take her statin every day but takes it as office as she can. - Lipid panel  3. Essential hypertension  Blood pressure not at goal. I increased her amlodipine to 10 mg. She understands to watch for swelling in her ankles.  4. Memory difficulty This has been stable and not progressive. - POCT urinalysis dipstick - POCT Microscopic Urinalysis ([facility]) - COMPLETE METABOLIC PANEL WITH GFR  5. Bilateral headaches These have actually been better. Her GYN gave her a prescription for Fioricet.  6. Insomnia This has been stable no change in medication  7. Other specified hypothyroidism  - TSH  8. Need for hepatitis C screening test  - Hepatitis C antibody  9. Screening for HIV without presence of risk factors  - HIV antibody  10. Osteoporosis screening  - VITAMIN D 25 Hydroxy (Vit-D Deficiency, Fractures)  11. Need for Td Tetanus shot given  12. Hyperlipemia  - rosuvastatin (CRESTOR) 5 MG tablet; Take 1 tablet (5 mg total) by mouth at bedtime.  Dispense: 30 tablet; Refill: 11   I personally performed the services described in this documentation, which was scribed in my presence. The recorded information has been reviewed and is accurate.  Lesle Chris, MD  [facility], [facility]  01/28/2015 1:14 PM

## 2015-02-06 ENCOUNTER — Telehealth: Payer: Self-pay | Admitting: Emergency Medicine

## 2015-02-06 ENCOUNTER — Telehealth: Payer: Self-pay

## 2015-02-06 NOTE — Telephone Encounter (Signed)
Spoke to pt and let her know. She will wait until August.

## 2015-02-06 NOTE — Telephone Encounter (Signed)
Patient is calling with questions about her lab results and to let us know about her MRI results. [phone]

## 2015-02-06 NOTE — Telephone Encounter (Signed)
I placed an order when the patient was seen January  to have a carotid Doppler redone. Please check on the status of this. My original note said to wait until the end of this shear but there should be in order and go ahead and repeated now.

## 2015-02-06 NOTE — Telephone Encounter (Signed)
Dr. Cleta Albertsaub,  I spoke to Kristy Arias. She is wondering if she needs to have another US carotid? She had one back in 2013, the results are scanned under media.   Kristy Arias also wanted to let you know her BP is doing better. It was 123/78 today.

## 2015-02-06 NOTE — Telephone Encounter (Signed)
The scan she had done was August 2015 and was completely normal. I would expect we should repeat it in 2 years which would be August 2017

## 2015-03-23 ENCOUNTER — Other Ambulatory Visit: Payer: Self-pay | Admitting: Emergency Medicine

## 2015-03-24 NOTE — Telephone Encounter (Signed)
Patient was just seen, no mention of cymbalta.  Can we refill?

## 2015-07-06 ENCOUNTER — Ambulatory Visit: Payer: Self-pay | Admitting: Orthopedic Surgery

## 2015-08-19 ENCOUNTER — Encounter (HOSPITAL_COMMUNITY)
Admission: RE | Admit: 2015-08-19 | Discharge: 2015-08-19 | Disposition: A | Discharge status: Home / Self Care | Payer: 59 | Source: Ambulatory Visit | Attending: Orthopedic Surgery | Admitting: Orthopedic Surgery

## 2015-08-19 ENCOUNTER — Encounter (HOSPITAL_COMMUNITY): Payer: Self-pay

## 2015-08-19 DIAGNOSIS — I10 Essential (primary) hypertension: Secondary | ICD-10-CM | POA: Diagnosis not present

## 2015-08-19 HISTORY — DX: Headache, unspecified: R51.9

## 2015-08-19 HISTORY — DX: Headache: R51

## 2015-08-19 LAB — COMPREHENSIVE METABOLIC PANEL
ALBUMIN: 3.6 g/dL (ref 3.5–5.0)
ALK PHOS: 66 U/L (ref 38–126)
ALT: 8 U/L — AB (ref 14–54)
AST: 26 U/L (ref 15–41)
Anion gap: 7 (ref 5–15)
BILIRUBIN TOTAL: 0.9 mg/dL (ref 0.3–1.2)
BUN: 10 mg/dL (ref 6–20)
CALCIUM: 9.2 mg/dL (ref 8.9–10.3)
CO2: 29 mmol/L (ref 22–32)
CREATININE: 0.86 mg/dL (ref 0.44–1.00)
Chloride: 103 mmol/L (ref 101–111)
GFR calc Af Amer: >60 mL/min (ref >60)
GFR calc non Af Amer: >60 mL/min (ref >60)
GLUCOSE: 97 mg/dL (ref 65–99)
POTASSIUM: 4.6 mmol/L (ref 3.5–5.1)
Sodium: 139 mmol/L (ref 135–145)
TOTAL PROTEIN: 7.2 g/dL (ref 6.5–8.1)

## 2015-08-19 LAB — URINALYSIS, ROUTINE W REFLEX MICROSCOPIC
BILIRUBIN URINE: NEGATIVE
Glucose, UA: NEGATIVE mg/dL
Hgb urine dipstick: NEGATIVE
KETONES UR: NEGATIVE mg/dL
LEUKOCYTES UA: NEGATIVE
NITRITE: NEGATIVE
Protein, ur: NEGATIVE mg/dL
SPECIFIC GRAVITY, URINE: 1.018 (ref 1.005–1.030)
pH: 6 (ref 5.0–8.0)

## 2015-08-19 LAB — CBC
HEMATOCRIT: 38.9 % (ref 36.0–46.0)
HEMOGLOBIN: 12.9 g/dL (ref 12.0–15.0)
MCH: 31.8 pg (ref 26.0–34.0)
MCHC: 33.2 g/dL (ref 30.0–36.0)
MCV: 95.8 fL (ref 78.0–100.0)
Platelets: 339 10*3/uL (ref 150–400)
RBC: 4.06 MIL/uL (ref 3.87–5.11)
RDW: 13 % (ref 11.5–15.5)
WBC: 7.6 10*3/uL (ref 4.0–10.5)

## 2015-08-19 LAB — SURGICAL PCR SCREEN
MRSA, PCR: NEGATIVE
STAPHYLOCOCCUS AUREUS: NEGATIVE

## 2015-08-19 LAB — PROTIME-INR
INR: 1.06 (ref 0.00–1.49)
Prothrombin Time: 13.5 seconds (ref 11.6–15.2)

## 2015-08-19 LAB — APTT: APTT: 30 s (ref 24–37)

## 2015-08-19 NOTE — Patient Instructions (Addendum)
Kristy Arias  08/19/2015   Your procedure is scheduled on: 08/25/15  Report to [facility] Main  Entrance take [facility]  elevators to 3rd floor to  Short Stay Center at 0500 AM.  Call this number if you have problems the morning of surgery [phone]   Remember: ONLY 1 PERSON MAY GO WITH YOU TO SHORT STAY TO GET  READY MORNING OF YOUR SURGERY.  Do not eat food or drink liquids :After Midnight.     Take these medicines the morning of surgery with A SIP OF WATER:  Cymbalta, Levothyroxine                                 You may not have any metal on your body including hair pins and              piercings  Do not wear jewelry, make-up, lotions, powders or perfumes, deodorant             Do not wear nail polish.  Do not shave  48 hours prior to surgery.              Men may shave face and neck.   Do not bring valuables to the hospital. [facility] IS NOT             RESPONSIBLE   FOR VALUABLES.  Contacts, dentures or bridgework may not be worn into surgery.  Leave suitcase in the car. After surgery it may be brought to your room.             - Preparing for Surgery Before surgery, you can play an important role.  Because skin is not sterile, your skin needs to be as free of germs as possible.  You can reduce the number of germs on your skin by washing with CHG (chlorahexidine gluconate) soap before surgery.  CHG is an antiseptic cleaner which kills germs and bonds with the skin to continue killing germs even after washing. Please DO NOT use if you have an allergy to CHG or antibacterial soaps.  If your skin becomes reddened/irritated stop using the CHG and inform your nurse when you arrive at Short Stay. Do not shave (including legs and underarms) for at least 48 hours prior to the first CHG shower.  You may shave your face/neck. Please follow these instructions carefully:  1.  Shower with CHG Soap the night before surgery and the  morning of  Surgery.  2.  If you choose to wash your hair, wash your hair first as usual with your  normal  shampoo.  3.  After you shampoo, rinse your hair and body thoroughly to remove the  shampoo.                           4.  Use CHG as you would any other liquid soap.  You can apply chg directly  to the skin and wash                       Gently with a scrungie or clean washcloth.  5.  Apply the CHG Soap to your body ONLY FROM THE NECK DOWN.   Do not use on face/ open  Wound or open sores. Avoid contact with eyes, ears mouth and genitals (private parts).                       Wash face,  Genitals (private parts) with your normal soap.             6.  Wash thoroughly, paying special attention to the area where your surgery  will be performed.  7.  Thoroughly rinse your body with warm water from the neck down.  8.  DO NOT shower/wash with your normal soap after using and rinsing off  the CHG Soap.                9.  Pat yourself dry with a clean towel.            10.  Wear clean pajamas.            11.  Place clean sheets on your bed the night of your first shower and do not  sleep with pets. Day of Surgery : Do not apply any lotions/deodorants the morning of surgery.  Please wear clean clothes to the hospital/surgery center.  FAILURE TO FOLLOW THESE INSTRUCTIONS MAY RESULT IN THE CANCELLATION OF YOUR SURGERY PATIENT SIGNATURE_________________________________  NURSE SIGNATURE__________________________________  ________________________________________________________________________  WHAT IS A BLOOD TRANSFUSION? Blood Transfusion Information  A transfusion is the replacement of blood or some of its parts. Blood is made up of multiple cells which provide different functions.  Red blood cells carry oxygen and are used for blood loss replacement.  White blood cells fight against infection.  Platelets control bleeding.  Plasma helps clot blood.  Other blood products are  available for specialized needs, such as hemophilia or other clotting disorders. BEFORE THE TRANSFUSION  Who gives blood for transfusions?   Healthy volunteers who are fully evaluated to make sure their blood is safe. This is blood bank blood. Transfusion therapy is the safest it has ever been in the practice of medicine. Before blood is taken from a donor, a complete history is taken to make sure that person has no history of diseases nor engages in risky social behavior (examples are intravenous drug use or sexual activity with multiple partners). The donor's travel history is screened to minimize risk of transmitting infections, such as malaria. The donated blood is tested for signs of infectious diseases, such as HIV and hepatitis. The blood is then tested to be sure it is compatible with you in order to minimize the chance of a transfusion reaction. If you or a relative donates blood, this is often done in anticipation of surgery and is not appropriate for emergency situations. It takes many days to process the donated blood. RISKS AND COMPLICATIONS Although transfusion therapy is very safe and saves many lives, the main dangers of transfusion include:   Getting an infectious disease.  Developing a transfusion reaction. This is an allergic reaction to something in the blood you were given. Every precaution is taken to prevent this. The decision to have a blood transfusion has been considered carefully by your caregiver before blood is given. Blood is not given unless the benefits outweigh the risks. AFTER THE TRANSFUSION  Right after receiving a blood transfusion, you will usually feel much better and more energetic. This is especially true if your red blood cells have gotten low (anemic). The transfusion raises the level of the red blood cells which carry oxygen, and this usually causes an energy increase.  The  nurse administering the transfusion will monitor you carefully for  complications. HOME CARE INSTRUCTIONS  No special instructions are needed after a transfusion. You may find your energy is better. Speak with your caregiver about any limitations on activity for underlying diseases you may have. SEEK MEDICAL CARE IF:   Your condition is not improving after your transfusion.  You develop redness or irritation at the intravenous (IV) site. SEEK IMMEDIATE MEDICAL CARE IF:  Any of the following symptoms occur over the next 12 hours:  Shaking chills.  You have a temperature by mouth above 102 F (38.9 C), not controlled by medicine.  Chest, back, or muscle pain.  People around you feel you are not acting correctly or are confused.  Shortness of breath or difficulty breathing.  Dizziness and fainting.  You get a rash or develop hives.  You have a decrease in urine output.  Your urine turns a dark color or changes to pink, red, or brown. Any of the following symptoms occur over the next 10 days:  You have a temperature by mouth above 102 F (38.9 C), not controlled by medicine.  Shortness of breath.  Weakness after normal activity.  The white part of the eye turns yellow (jaundice).  You have a decrease in the amount of urine or are urinating less often.  Your urine turns a dark color or changes to pink, red, or brown. Document Released: 01/09/2000 Document Revised: 04/05/2011 Document Reviewed: 08/28/2007 ExitCare Patient Information 2014 [place].  _______________________________________________________________________  Incentive Spirometer  An incentive spirometer is a tool that can help keep your lungs clear and active. This tool measures how well you are filling your lungs with each breath. Taking long deep breaths may help reverse or decrease the chance of developing breathing (pulmonary) problems (especially infection) following:  A long period of time when you are unable to move or be active. BEFORE THE PROCEDURE   If  the spirometer includes an indicator to show your best effort, your nurse or respiratory therapist will set it to a desired goal.  If possible, sit up straight or lean slightly forward. Try not to slouch.  Hold the incentive spirometer in an upright position. INSTRUCTIONS FOR USE  1. Sit on the edge of your bed if possible, or sit up as far as you can in bed or on a chair. 2. Hold the incentive spirometer in an upright position. 3. Breathe out normally. 4. Place the mouthpiece in your mouth and seal your lips tightly around it. 5. Breathe in slowly and as deeply as possible, raising the piston or the ball toward the top of the column. 6. Hold your breath for 3-5 seconds or for as long as possible. Allow the piston or ball to fall to the bottom of the column. 7. Remove the mouthpiece from your mouth and breathe out normally. 8. Rest for a few seconds and repeat Steps 1 through 7 at least 10 times every 1-2 hours when you are awake. Take your time and take a few normal breaths between deep breaths. 9. The spirometer may include an indicator to show your best effort. Use the indicator as a goal to work toward during each repetition. 10. After each set of 10 deep breaths, practice coughing to be sure your lungs are clear. If you have an incision (the cut made at the time of surgery), support your incision when coughing by placing a pillow or rolled up towels firmly against it. Once you are able to get  out of bed, walk around indoors and cough well. You may stop using the incentive spirometer when instructed by your caregiver.  RISKS AND COMPLICATIONS  Take your time so you do not get dizzy or light-headed.  If you are in pain, you may need to take or ask for pain medication before doing incentive spirometry. It is harder to take a deep breath if you are having pain. AFTER USE  Rest and breathe slowly and easily.  It can be helpful to keep track of a log of your progress. Your caregiver can  provide you with a simple table to help with this. If you are using the spirometer at home, follow these instructions: [place] IF:   You are having difficultly using the spirometer.  You have trouble using the spirometer as often as instructed.  Your pain medication is not giving enough relief while using the spirometer.  You develop fever of 100.5 F (38.1 C) or higher. SEEK IMMEDIATE MEDICAL CARE IF:   You cough up bloody sputum that had not been present before.  You develop fever of 102 F (38.9 C) or greater.  You develop worsening pain at or near the incision site. MAKE SURE YOU:   Understand these instructions.  Will watch your condition.  Will get help right away if you are not doing well or get worse. Document Released: 05/24/2006 Document Revised: 04/05/2011 Document Reviewed: 07/25/2006 [facility] Patient Information 2014 [place], [place].   ________________________________________________________________________

## 2015-08-19 NOTE — Progress Notes (Signed)
ekg 09/24/14  Carotid dopplar study 8/15 Clearance dr vyas, daub on chart

## 2015-08-19 NOTE — Progress Notes (Signed)
Clearance dr Cleta Alberts, dr vyas chart

## 2015-08-20 LAB — ABO/RH: ABO/RH(D): O POS

## 2015-08-24 ENCOUNTER — Ambulatory Visit: Payer: Self-pay | Admitting: Orthopedic Surgery

## 2015-08-24 NOTE — H&P (Signed)
Kristy Arias DOB: 02/23/1950 Married / Language: English / Race: White Female Date of Admission:  7/331/2017 CC:  Bilateral Knee Pain History of Present Illness  The patient is a 65 year old female who comes in  for a preoperative History and Physical. The patient is scheduled for a right total knee arthroplasty and a left knee cortisone injection to be performed by Dr. Gus Rankin. Aluisio, MD at [facility] on 08/25/2015. The patient is a 65 year old female who presented for follow up of their knee. The patient is being followed for their bilateral knee pain and osteoarthritis. They are now month(s) out from cortisone injections. Symptoms reported include: pain, swelling, aching, throbbing and instability. The patient feels that they are doing poorly and report their pain level to be moderate to severe. Current treatment includes: NSAIDs (Diclofenac bid). The following medication has been used for pain control: Fioricet (1-3 qd). The patient has not gotten any relief of their symptoms with Cortisone injections. The last injections were not really very beneficial. She went on a cruise afterward, and reports severe pain for about a week after the injections. She said the knees are bothering her badly and is ready to get them fixed. She is ready to proceed with the rihgt knee surgery but would like to get a cortisone injection into the left knee at tthe same time. They have been treated conservatively in the past for the above stated problem and despite conservative measures, they continue to have progressive pain and severe functional limitations and dysfunction. They have failed non-operative management including home exercise, medications, and injections. It is felt that they would benefit from undergoing total joint replacement. Risks and benefits of the procedure have been discussed with the patient and they elect to proceed with surgery. There are no active contraindications to surgery such as  ongoing infection or rapidly progressive neurological disease.  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 Congestive Heart Failure  Maternal Grandmother, Paternal Grandmother. Depression  Father, Mother. Cancer  Mother, Sister. Rheumatoid Arthritis  Father. Severe allergy  Mother. Osteoarthritis  Father, Maternal Grandmother, Mother. Heart Disease  Father, Maternal Grandfather, Paternal Grandfather. Diabetes Mellitus  Father, Maternal Grandfather.  Social History Never consumed alcohol  11/27/2013: Never consumed alcohol Most recent primary occupation  pharmacist Marital status  married Exercise  Exercises weekly; does other Children  2 Current work status  working part time Living situation  live with spouse Tobacco / smoke exposure  11/27/2013: yes outdoors only No history of drug/alcohol rehab  Tobacco use  Never smoker. 11/27/2013 Not under pain contract  Number of flights of stairs before winded  1  Medication History Tart Cherry Advanced (Oral) Active. Excedrin Migraine (250-250-65MG  Tablet, Oral as needed) Active. Butalbital-APAP-Caffeine (Oral) Specific strength unknown - Active. (prn) AmLODIPine Besylate (10MG  Tablet, Oral) Active. (1qd) Voltaren (75MG  Tablet DR, Oral) Active. (1 BID) Levothyroxine Sodium ( Tablet, Oral) Active. Losartan Potassium (25MG  Tablet, Oral)  Active. Cymbalta (30MG  Capsule DR Part, Oral) Active. Premarin (0.9MG  Tablet, Oral)  Active. Xanax (0.5MG  Tablet, Oral three times daily) Active. Nortriptyline HCl (  Capsule, Oral at bedtime, as needed) Active. Calcium 600 (  Tablet, Oral) Active. Vitamin D (2000UNIT Capsule, Oral) Active. Creon (12000UNIT Capsule DR Part, Oral) Active. (PRN) Timolol Maleate (0.5% Solution, Ophthalmic two times daily) Active. Travatan Z (0.004% Solution, Ophthalmic) Active. Crestor (  Tablet, Oral) Active.  Past Surgical History Dilation and Curettage of Uterus - Multiple  Hysterectomy  Date: 2015. complete (non-cancerous) Gallbladder Surgery  Date: 2014. laporoscopic Breast Reconstruction  bilateral; 1978, 1997  Review of Systems General Not Present- Chills, Fatigue, Fever, Memory Loss, Night Sweats, Weight Gain and Weight Loss. Skin Not Present- Eczema, Hives, Itching, Lesions and Rash. HEENT Present- Headache. Not Present- Dentures, Double Vision, Hearing Loss, Tinnitus and Visual Loss. Respiratory Present- Shortness of breath with exertion. Not Present- Allergies, Chronic Cough, Coughing up blood and Shortness of breath at rest. Cardiovascular Present- Murmur. Not Present- Chest Pain, Difficulty Breathing Lying Down, Palpitations, Racing/skipping heartbeats and Swelling. Gastrointestinal Not Present- Abdominal Pain, Bloody Stool, Constipation, Diarrhea, Difficulty Swallowing, Heartburn, Jaundice, Loss of appetitie, Nausea and Vomiting. Female Genitourinary Present- Urinating at Night. Not Present- Blood in Urine, Discharge, Flank Pain, Incontinence, Painful Urination, Urgency, Urinary frequency, Urinary Retention and Weak urinary stream. Musculoskeletal Present- Back Pain, Joint Pain and Morning Stiffness. Not Present- Joint Swelling, Muscle Pain, Muscle Weakness and Spasms. Neurological Not Present- Blackout spells, Difficulty with balance, Dizziness, Paralysis, Tremor and  Weakness. Psychiatric Not Present- Insomnia.  Vitals Weight: 120 lb Height: 62.5in Body Surface Area: 1.55 m Body Mass Index: 21.6 kg/m  Pulse: 68 (Regular)  BP: 144/84 (Sitting, Right Arm, Standard)  Physical Exam General Mental Status -Alert, cooperative and good historian. General Appearance-pleasant, Not in acute distress. Orientation-Oriented X3. Build & Nutrition-Well nourished and Well developed.  Head and Neck Head-normocephalic, atraumatic . Neck Global Assessment - supple, no bruit auscultated on the right, no bruit auscultated on the left.  Eye Vision-Wears corrective lenses. Pupil - Bilateral-Regular and Round. Motion - Bilateral-EOMI.  Chest and Lung Exam Auscultation Breath sounds - clear at anterior chest wall and clear at posterior chest wall. Adventitious sounds - No Adventitious sounds.  Cardiovascular Auscultation Rhythm - Regular rate and rhythm. Heart Sounds - S1 WNL and S2 WNL. Murmurs & Other Heart Sounds - Auscultation of the heart reveals - No Murmurs.  Abdomen Palpation/Percussion Tenderness - Abdomen is non-tender to palpation. Rigidity (guarding) - Abdomen is soft. Auscultation Auscultation of the abdomen reveals - Bowel sounds normal.  Female Genitourinary Note: Not done, not pertinent to present illness   Musculoskeletal Note: On exam, she is in no distress. Both knees show no effusion. Right knee range about 5 to 120. There is marked crepitus on range of motion. Tenderness medial greater than lateral and no instability. Left knee, no effusion. Range about 5 to 130. Slight crepitus on range of motion. Tender medial greater than lateral and no instability.  RADIOGRAPHS I reviewed her radiographs, she does have bone-on-bone arthritis in the medial and patellofemoral compartments of the right knee. Left knee has arthritis, but to a lesser degree.  Assessment & Plan Primary osteoarthritis of left knee  (M17.12) Primary osteoarthritis of right knee (M17.11)  Note:Surgical Plans: Right Total Knee Replacement and a Left Knee Cortisone injection  Disposition: Home  PCP: Dr. Cleta Alberts - Patient has been seen preoperatively and felt to be stable for surgery. Cards: Dr. Sherril Croon - Patient has been seen preoperatively and felt to be stable for surgery.  IV TXA  Anesthesia Issues: None  Veritas Study Patient Traditional Therapy  Signed electronically by Beckey Rutter, III PA-C

## 2015-08-25 ENCOUNTER — Encounter (HOSPITAL_COMMUNITY)
Admission: RE | Disposition: A | Discharge status: Home Health | Payer: Self-pay | Source: Ambulatory Visit | Attending: Orthopedic Surgery

## 2015-08-25 ENCOUNTER — Inpatient Hospital Stay (HOSPITAL_COMMUNITY): Payer: 59 | Admitting: Anesthesiology

## 2015-08-25 ENCOUNTER — Encounter (HOSPITAL_COMMUNITY): Payer: Self-pay | Admitting: *Deleted

## 2015-08-25 ENCOUNTER — Inpatient Hospital Stay (HOSPITAL_COMMUNITY)
Admission: RE | Admit: 2015-08-25 | Discharge: 2015-08-27 | DRG: 470 | Disposition: A | Discharge status: Home Health | Payer: 59 | Source: Ambulatory Visit | Attending: Orthopedic Surgery | Admitting: Orthopedic Surgery

## 2015-08-25 DIAGNOSIS — M1710 Unilateral primary osteoarthritis, unspecified knee: Secondary | ICD-10-CM | POA: Diagnosis present

## 2015-08-25 DIAGNOSIS — I10 Essential (primary) hypertension: Secondary | ICD-10-CM | POA: Diagnosis present

## 2015-08-25 DIAGNOSIS — M25561 Pain in right knee: Secondary | ICD-10-CM | POA: Diagnosis present

## 2015-08-25 DIAGNOSIS — E785 Hyperlipidemia, unspecified: Secondary | ICD-10-CM | POA: Diagnosis present

## 2015-08-25 DIAGNOSIS — M170 Bilateral primary osteoarthritis of knee: Principal | ICD-10-CM | POA: Diagnosis present

## 2015-08-25 DIAGNOSIS — M179 Osteoarthritis of knee, unspecified: Secondary | ICD-10-CM | POA: Diagnosis present

## 2015-08-25 DIAGNOSIS — F329 Major depressive disorder, single episode, unspecified: Secondary | ICD-10-CM | POA: Diagnosis present

## 2015-08-25 DIAGNOSIS — K219 Gastro-esophageal reflux disease without esophagitis: Secondary | ICD-10-CM | POA: Diagnosis present

## 2015-08-25 DIAGNOSIS — H409 Unspecified glaucoma: Secondary | ICD-10-CM | POA: Diagnosis present

## 2015-08-25 DIAGNOSIS — E039 Hypothyroidism, unspecified: Secondary | ICD-10-CM | POA: Diagnosis present

## 2015-08-25 HISTORY — PX: TOTAL KNEE ARTHROPLASTY: SHX125

## 2015-08-25 LAB — TYPE AND SCREEN
ABO/RH(D): O POS
ANTIBODY SCREEN: NEGATIVE

## 2015-08-25 SURGERY — ARTHROPLASTY, KNEE, TOTAL
Anesthesia: Spinal | Site: Knee | Laterality: Right

## 2015-08-25 MED ORDER — DULOXETINE HCL 30 MG PO CPEP
60.0000 mg | ORAL_CAPSULE | Freq: Every morning | ORAL | Status: DC
  Administered 2015-08-27: 60 mg via ORAL
  Filled 2015-08-25 (×2): qty 2

## 2015-08-25 MED ORDER — TRIAMCINOLONE ACETONIDE 40 MG/ML IJ SUSP
40.0000 mg | Freq: Once | INTRAMUSCULAR | Status: DC
  Filled 2015-08-25 (×2): qty 1

## 2015-08-25 MED ORDER — MIDAZOLAM HCL 2 MG/2ML IJ SOLN
INTRAMUSCULAR | Status: AC
  Filled 2015-08-25: qty 2

## 2015-08-25 MED ORDER — METHOCARBAMOL 1000 MG/10ML IJ SOLN
500.0000 mg | Freq: Four times a day (QID) | INTRAVENOUS | Status: DC | PRN
  Administered 2015-08-25: 500 mg via INTRAVENOUS
  Filled 2015-08-25: qty 550
  Filled 2015-08-25: qty 5

## 2015-08-25 MED ORDER — MORPHINE SULFATE (PF) 2 MG/ML IV SOLN
1.0000 mg | INTRAVENOUS | Status: DC | PRN
  Administered 2015-08-25 (×3): 1 mg via INTRAVENOUS
  Filled 2015-08-25 (×2): qty 1

## 2015-08-25 MED ORDER — RIVAROXABAN 10 MG PO TABS
10.0000 mg | ORAL_TABLET | Freq: Every day | ORAL | Status: DC
  Administered 2015-08-26 – 2015-08-27 (×2): 10 mg via ORAL
  Filled 2015-08-25 (×2): qty 1

## 2015-08-25 MED ORDER — FLEET ENEMA 7-19 GM/118ML RE ENEM: 1.0000 | ENEMA | Freq: Once | RECTAL | Status: DC | PRN

## 2015-08-25 MED ORDER — TRANEXAMIC ACID 1000 MG/10ML IV SOLN
1000.0000 mg | Freq: Once | INTRAVENOUS | Status: AC
  Administered 2015-08-25: 1000 mg via INTRAVENOUS
  Filled 2015-08-25: qty 10

## 2015-08-25 MED ORDER — ACETAMINOPHEN 325 MG PO TABS
650.0000 mg | ORAL_TABLET | Freq: Four times a day (QID) | ORAL | Status: DC | PRN
Start: 2015-08-26 — End: 2015-08-27
  Filled 2015-08-25: qty 2

## 2015-08-25 MED ORDER — PROPOFOL 10 MG/ML IV BOLUS
INTRAVENOUS | Status: AC
  Filled 2015-08-25: qty 40

## 2015-08-25 MED ORDER — DEXAMETHASONE SODIUM PHOSPHATE 10 MG/ML IJ SOLN
10.0000 mg | Freq: Once | INTRAMUSCULAR | Status: AC
  Administered 2015-08-25: 10 mg via INTRAVENOUS

## 2015-08-25 MED ORDER — CEFAZOLIN SODIUM-DEXTROSE 2-4 GM/100ML-% IV SOLN
2.0000 g | INTRAVENOUS | Status: AC
  Administered 2015-08-25: 2 g via INTRAVENOUS

## 2015-08-25 MED ORDER — METHOCARBAMOL 500 MG PO TABS
500.0000 mg | ORAL_TABLET | Freq: Four times a day (QID) | ORAL | Status: DC | PRN
  Administered 2015-08-25 – 2015-08-26 (×4): 500 mg via ORAL
  Filled 2015-08-25 (×4): qty 1

## 2015-08-25 MED ORDER — ALPRAZOLAM 0.5 MG PO TABS: 0.5000 mg | ORAL_TABLET | Freq: Every evening | ORAL | Status: DC | PRN

## 2015-08-25 MED ORDER — LACTATED RINGERS IV SOLN: INTRAVENOUS | Status: DC

## 2015-08-25 MED ORDER — POLYETHYLENE GLYCOL 3350 17 G PO PACK: 17.0000 g | PACK | Freq: Every day | ORAL | Status: DC | PRN

## 2015-08-25 MED ORDER — TIMOLOL MALEATE 0.5 % OP SOLN
1.0000 [drp] | Freq: Two times a day (BID) | OPHTHALMIC | Status: DC
  Administered 2015-08-25 – 2015-08-27 (×5): 1 [drp] via OPHTHALMIC
  Filled 2015-08-25: qty 5

## 2015-08-25 MED ORDER — METOCLOPRAMIDE HCL 5 MG PO TABS: 5.0000 mg | ORAL_TABLET | Freq: Three times a day (TID) | ORAL | Status: DC | PRN

## 2015-08-25 MED ORDER — DIPHENHYDRAMINE HCL 12.5 MG/5ML PO ELIX: 12.5000 mg | ORAL_SOLUTION | ORAL | Status: DC | PRN

## 2015-08-25 MED ORDER — METHYLPREDNISOLONE ACETATE 40 MG/ML IJ SUSP
INTRAMUSCULAR | Status: DC | PRN
  Administered 2015-08-25: 80 mg

## 2015-08-25 MED ORDER — EPHEDRINE SULFATE 50 MG/ML IJ SOLN
INTRAMUSCULAR | Status: DC | PRN
  Administered 2015-08-25: 10 mg via INTRAVENOUS
  Administered 2015-08-25: 15 mg via INTRAVENOUS
  Administered 2015-08-25: 10 mg via INTRAVENOUS

## 2015-08-25 MED ORDER — PROPOFOL 500 MG/50ML IV EMUL
INTRAVENOUS | Status: DC | PRN
  Administered 2015-08-25: 75 ug/kg/min via INTRAVENOUS

## 2015-08-25 MED ORDER — BISACODYL 10 MG RE SUPP: 10.0000 mg | Freq: Every day | RECTAL | Status: DC | PRN

## 2015-08-25 MED ORDER — TRANEXAMIC ACID 1000 MG/10ML IV SOLN
1000.0000 mg | INTRAVENOUS | Status: AC
  Administered 2015-08-25: 1000 mg via INTRAVENOUS
  Filled 2015-08-25: qty 10

## 2015-08-25 MED ORDER — METHYLPREDNISOLONE ACETATE 40 MG/ML IJ SUSP
INTRAMUSCULAR | Status: AC
  Filled 2015-08-25: qty 2

## 2015-08-25 MED ORDER — OXYCODONE HCL 5 MG PO TABS
5.0000 mg | ORAL_TABLET | ORAL | Status: DC | PRN
  Administered 2015-08-25 (×2): 10 mg via ORAL
  Administered 2015-08-25: 5 mg via ORAL
  Administered 2015-08-25: 10 mg via ORAL
  Administered 2015-08-25: 5 mg via ORAL
  Administered 2015-08-26 (×2): 10 mg via ORAL
  Filled 2015-08-25 (×3): qty 2
  Filled 2015-08-25 (×2): qty 1
  Filled 2015-08-25 (×2): qty 2

## 2015-08-25 MED ORDER — SODIUM CHLORIDE 0.9 % IJ SOLN
INTRAMUSCULAR | Status: AC
  Filled 2015-08-25: qty 50

## 2015-08-25 MED ORDER — LOSARTAN POTASSIUM 25 MG PO TABS
25.0000 mg | ORAL_TABLET | Freq: Every day | ORAL | Status: DC
  Administered 2015-08-25 – 2015-08-27 (×3): 25 mg via ORAL
  Filled 2015-08-25 (×3): qty 1

## 2015-08-25 MED ORDER — BUPIVACAINE HCL (PF) 0.25 % IJ SOLN
INTRAMUSCULAR | Status: AC
  Filled 2015-08-25: qty 30

## 2015-08-25 MED ORDER — METOCLOPRAMIDE HCL 5 MG/ML IJ SOLN: 5.0000 mg | Freq: Three times a day (TID) | INTRAMUSCULAR | Status: DC | PRN

## 2015-08-25 MED ORDER — ONDANSETRON HCL 4 MG/2ML IJ SOLN: 4.0000 mg | Freq: Four times a day (QID) | INTRAMUSCULAR | Status: DC | PRN

## 2015-08-25 MED ORDER — ACETAMINOPHEN 10 MG/ML IV SOLN
INTRAVENOUS | Status: AC
  Filled 2015-08-25: qty 100

## 2015-08-25 MED ORDER — HYDROMORPHONE HCL 1 MG/ML IJ SOLN
0.5000 mg | INTRAMUSCULAR | Status: DC | PRN
  Administered 2015-08-25: 0.5 mg via INTRAVENOUS
  Administered 2015-08-25: 1 mg via INTRAVENOUS
  Administered 2015-08-25: 0.5 mg via INTRAVENOUS
  Administered 2015-08-26 (×4): 1 mg via INTRAVENOUS
  Filled 2015-08-25 (×7): qty 1

## 2015-08-25 MED ORDER — AMLODIPINE BESYLATE 10 MG PO TABS
10.0000 mg | ORAL_TABLET | Freq: Every day | ORAL | Status: DC
  Administered 2015-08-25 – 2015-08-26 (×2): 10 mg via ORAL
  Filled 2015-08-25 (×2): qty 1

## 2015-08-25 MED ORDER — ROSUVASTATIN CALCIUM 5 MG PO TABS
5.0000 mg | ORAL_TABLET | Freq: Every day | ORAL | Status: DC
  Administered 2015-08-25 – 2015-08-26 (×2): 5 mg via ORAL
  Filled 2015-08-25 (×2): qty 1

## 2015-08-25 MED ORDER — DEXAMETHASONE SODIUM PHOSPHATE 10 MG/ML IJ SOLN
10.0000 mg | Freq: Once | INTRAMUSCULAR | Status: AC
  Administered 2015-08-26: 10 mg via INTRAVENOUS
  Filled 2015-08-25: qty 1

## 2015-08-25 MED ORDER — BUPIVACAINE LIPOSOME 1.3 % IJ SUSP
20.0000 mL | Freq: Once | INTRAMUSCULAR | Status: DC
  Filled 2015-08-25: qty 20

## 2015-08-25 MED ORDER — LIDOCAINE HCL (CARDIAC) 20 MG/ML IV SOLN
INTRAVENOUS | Status: DC | PRN
  Administered 2015-08-25: 50 mg via INTRAVENOUS

## 2015-08-25 MED ORDER — ACETAMINOPHEN 10 MG/ML IV SOLN
1000.0000 mg | Freq: Once | INTRAVENOUS | Status: AC
  Administered 2015-08-25: 1000 mg via INTRAVENOUS

## 2015-08-25 MED ORDER — MENTHOL 3 MG MT LOZG: 1.0000 | LOZENGE | OROMUCOSAL | Status: DC | PRN

## 2015-08-25 MED ORDER — BUTALBITAL-APAP-CAFFEINE 50-325-40 MG PO TABS
1.0000 | ORAL_TABLET | ORAL | Status: DC | PRN
Start: 2015-08-25 — End: 2015-08-27

## 2015-08-25 MED ORDER — ACETAMINOPHEN 650 MG RE SUPP: 650.0000 mg | Freq: Four times a day (QID) | RECTAL | Status: DC | PRN

## 2015-08-25 MED ORDER — METOCLOPRAMIDE HCL 5 MG/ML IJ SOLN: 10.0000 mg | Freq: Once | INTRAMUSCULAR | Status: DC | PRN

## 2015-08-25 MED ORDER — SODIUM CHLORIDE 0.9 % IV SOLN
INTRAVENOUS | Status: DC
  Administered 2015-08-25 – 2015-08-26 (×2): via INTRAVENOUS

## 2015-08-25 MED ORDER — ONDANSETRON HCL 4 MG PO TABS: 4.0000 mg | ORAL_TABLET | Freq: Four times a day (QID) | ORAL | Status: DC | PRN

## 2015-08-25 MED ORDER — CHLORHEXIDINE GLUCONATE 4 % EX LIQD: 60.0000 mL | Freq: Once | CUTANEOUS | Status: DC

## 2015-08-25 MED ORDER — LACTATED RINGERS IV SOLN
INTRAVENOUS | Status: DC
  Administered 2015-08-25 (×2): via INTRAVENOUS

## 2015-08-25 MED ORDER — CEFAZOLIN SODIUM-DEXTROSE 2-4 GM/100ML-% IV SOLN
INTRAVENOUS | Status: AC
  Filled 2015-08-25: qty 100

## 2015-08-25 MED ORDER — DOCUSATE SODIUM 100 MG PO CAPS
100.0000 mg | ORAL_CAPSULE | Freq: Two times a day (BID) | ORAL | Status: DC
  Administered 2015-08-25 – 2015-08-27 (×4): 100 mg via ORAL
  Filled 2015-08-25 (×4): qty 1

## 2015-08-25 MED ORDER — FENTANYL CITRATE (PF) 100 MCG/2ML IJ SOLN
INTRAMUSCULAR | Status: DC | PRN
  Administered 2015-08-25: 100 ug via INTRAVENOUS

## 2015-08-25 MED ORDER — BUPIVACAINE HCL 0.25 % IJ SOLN
INTRAMUSCULAR | Status: DC | PRN
  Administered 2015-08-25: 20 mL

## 2015-08-25 MED ORDER — LATANOPROST 0.005 % OP SOLN
1.0000 [drp] | Freq: Every day | OPHTHALMIC | Status: DC
  Administered 2015-08-25 – 2015-08-26 (×2): 1 [drp] via OPHTHALMIC
  Filled 2015-08-25: qty 2.5

## 2015-08-25 MED ORDER — BUPIVACAINE HCL (PF) 0.75 % IJ SOLN
INTRAMUSCULAR | Status: DC | PRN
  Administered 2015-08-25: 1.4 mL via INTRATHECAL

## 2015-08-25 MED ORDER — LEVOTHYROXINE SODIUM 50 MCG PO TABS
50.0000 ug | ORAL_TABLET | Freq: Every day | ORAL | Status: DC
  Administered 2015-08-26 – 2015-08-27 (×2): 50 ug via ORAL
  Filled 2015-08-25 (×2): qty 1

## 2015-08-25 MED ORDER — PHENOL 1.4 % MT LIQD
1.0000 | OROMUCOSAL | Status: DC | PRN
  Filled 2015-08-25: qty 177

## 2015-08-25 MED ORDER — FENTANYL CITRATE (PF) 100 MCG/2ML IJ SOLN
INTRAMUSCULAR | Status: AC
  Filled 2015-08-25: qty 2

## 2015-08-25 MED ORDER — TEMAZEPAM 15 MG PO CAPS
30.0000 mg | ORAL_CAPSULE | Freq: Every evening | ORAL | Status: DC | PRN
  Administered 2015-08-25 – 2015-08-26 (×2): 30 mg via ORAL
  Filled 2015-08-25 (×2): qty 2

## 2015-08-25 MED ORDER — MORPHINE SULFATE (PF) 2 MG/ML IV SOLN
INTRAVENOUS | Status: AC
  Filled 2015-08-25: qty 1

## 2015-08-25 MED ORDER — MIDAZOLAM HCL 5 MG/5ML IJ SOLN
INTRAMUSCULAR | Status: DC | PRN
  Administered 2015-08-25: 2 mg via INTRAVENOUS

## 2015-08-25 MED ORDER — FENTANYL CITRATE (PF) 100 MCG/2ML IJ SOLN: 25.0000 ug | INTRAMUSCULAR | Status: DC | PRN

## 2015-08-25 MED ORDER — CEFAZOLIN SODIUM-DEXTROSE 2-4 GM/100ML-% IV SOLN
2.0000 g | Freq: Four times a day (QID) | INTRAVENOUS | Status: AC
  Administered 2015-08-25 (×2): 2 g via INTRAVENOUS
  Filled 2015-08-25 (×2): qty 100

## 2015-08-25 MED ORDER — BUPIVACAINE LIPOSOME 1.3 % IJ SUSP
INTRAMUSCULAR | Status: DC | PRN
  Administered 2015-08-25: 20 mL

## 2015-08-25 MED ORDER — ACETAMINOPHEN 500 MG PO TABS
1000.0000 mg | ORAL_TABLET | Freq: Four times a day (QID) | ORAL | Status: AC
Start: 2015-08-25 — End: 2015-08-26
  Administered 2015-08-25 – 2015-08-26 (×3): 1000 mg via ORAL
  Filled 2015-08-25 (×3): qty 2

## 2015-08-25 MED ORDER — SODIUM CHLORIDE 0.9 % IJ SOLN
INTRAMUSCULAR | Status: DC | PRN
  Administered 2015-08-25: 30 mL

## 2015-08-25 MED ORDER — MEPERIDINE HCL 50 MG/ML IJ SOLN: 6.2500 mg | INTRAMUSCULAR | Status: DC | PRN

## 2015-08-25 MED ORDER — PROPOFOL 10 MG/ML IV BOLUS
INTRAVENOUS | Status: AC
  Filled 2015-08-25: qty 20

## 2015-08-25 SURGICAL SUPPLY — 50 items
BAG DECANTER FOR FLEXI CONT (MISCELLANEOUS) ×2 IMPLANT
BAG SPEC THK2 15X12 ZIP CLS (MISCELLANEOUS) ×1
BAG ZIPLOCK 12X15 (MISCELLANEOUS) ×2 IMPLANT
BANDAGE ACE 6X5 VEL STRL LF (GAUZE/BANDAGES/DRESSINGS) ×2 IMPLANT
BLADE SAG 18X100X1.27 (BLADE) ×2 IMPLANT
BLADE SAW SGTL 11.0X1.19X90.0M (BLADE) ×2 IMPLANT
BOWL SMART MIX CTS (DISPOSABLE) ×2 IMPLANT
CAPT KNEE TOTAL 3 ATTUNE ×1 IMPLANT
CEMENT HV SMART SET (Cement) ×4 IMPLANT
CLOTH BEACON ORANGE TIMEOUT ST (SAFETY) ×2 IMPLANT
CUFF TOURN SGL QUICK 34 (TOURNIQUET CUFF) ×2
CUFF TRNQT CYL 34X4X40X1 (TOURNIQUET CUFF) ×1 IMPLANT
DECANTER SPIKE VIAL GLASS SM (MISCELLANEOUS) ×2 IMPLANT
DRAPE U-SHAPE 47X51 STRL (DRAPES) ×2 IMPLANT
DRSG ADAPTIC 3X8 NADH LF (GAUZE/BANDAGES/DRESSINGS) ×2 IMPLANT
DRSG PAD ABDOMINAL 8X10 ST (GAUZE/BANDAGES/DRESSINGS) ×2 IMPLANT
DURAPREP 26ML APPLICATOR (WOUND CARE) ×2 IMPLANT
ELECT REM PT RETURN 9FT ADLT (ELECTROSURGICAL) ×2
ELECTRODE REM PT RTRN 9FT ADLT (ELECTROSURGICAL) ×1 IMPLANT
EVACUATOR 1/8 PVC DRAIN (DRAIN) ×2 IMPLANT
GAUZE SPONGE 4X4 12PLY STRL (GAUZE/BANDAGES/DRESSINGS) ×2 IMPLANT
GLOVE BIO SURGEON STRL SZ7.5 (GLOVE) IMPLANT
GLOVE BIO SURGEON STRL SZ8 (GLOVE) ×2 IMPLANT
GLOVE BIOGEL PI IND STRL 6.5 (GLOVE) IMPLANT
GLOVE BIOGEL PI IND STRL 8 (GLOVE) ×1 IMPLANT
GLOVE BIOGEL PI INDICATOR 6.5 (GLOVE)
GLOVE BIOGEL PI INDICATOR 8 (GLOVE) ×1
GLOVE SURG SS PI 6.5 STRL IVOR (GLOVE) IMPLANT
GOWN STRL REUS W/TWL LRG LVL3 (GOWN DISPOSABLE) ×2 IMPLANT
GOWN STRL REUS W/TWL XL LVL3 (GOWN DISPOSABLE) IMPLANT
HANDPIECE INTERPULSE COAX TIP (DISPOSABLE) ×2
IMMOBILIZER KNEE 20 (SOFTGOODS) ×2
IMMOBILIZER KNEE 20 THIGH 36 (SOFTGOODS) ×1 IMPLANT
MANIFOLD NEPTUNE II (INSTRUMENTS) ×2 IMPLANT
NS IRRIG 1000ML POUR BTL (IV SOLUTION) ×2 IMPLANT
PACK TOTAL KNEE CUSTOM (KITS) ×2 IMPLANT
PADDING CAST COTTON 6X4 STRL (CAST SUPPLIES) ×4 IMPLANT
POSITIONER SURGICAL ARM (MISCELLANEOUS) ×2 IMPLANT
SET HNDPC FAN SPRY TIP SCT (DISPOSABLE) ×1 IMPLANT
STRIP CLOSURE SKIN 1/2X4 (GAUZE/BANDAGES/DRESSINGS) ×3 IMPLANT
SUT MNCRL AB 4-0 PS2 18 (SUTURE) ×2 IMPLANT
SUT VIC AB 2-0 CT1 27 (SUTURE) ×6
SUT VIC AB 2-0 CT1 TAPERPNT 27 (SUTURE) ×3 IMPLANT
SUT VLOC 180 0 24IN GS25 (SUTURE) ×2 IMPLANT
SYR 50ML LL SCALE MARK (SYRINGE) ×2 IMPLANT
TRAY FOLEY W/METER SILVER 14FR (SET/KITS/TRAYS/PACK) ×2 IMPLANT
TRAY FOLEY W/METER SILVER 16FR (SET/KITS/TRAYS/PACK) ×2 IMPLANT
WATER STERILE IRR 1500ML POUR (IV SOLUTION) ×2 IMPLANT
WRAP KNEE MAXI GEL POST OP (GAUZE/BANDAGES/DRESSINGS) ×2 IMPLANT
YANKAUER SUCT BULB TIP 10FT TU (MISCELLANEOUS) ×2 IMPLANT

## 2015-08-25 NOTE — H&P (View-Only) (Signed)
Kristy Arias DOB: 02/23/1950 Married / Language: English / Race: White Female Date of Admission:  7/331/2017 CC:  Bilateral Knee Pain History of Present Illness  The patient is a 65 year old female who comes in  for a preoperative History and Physical. The patient is scheduled for a right total knee arthroplasty and a left knee cortisone injection to be performed by Dr. Gus Rankin. Aluisio, MD at [facility] on 08/25/2015. The patient is a 65 year old female who presented for follow up of their knee. The patient is being followed for their bilateral knee pain and osteoarthritis. They are now month(s) out from cortisone injections. Symptoms reported include: pain, swelling, aching, throbbing and instability. The patient feels that they are doing poorly and report their pain level to be moderate to severe. Current treatment includes: NSAIDs (Diclofenac bid). The following medication has been used for pain control: Fioricet (1-3 qd). The patient has not gotten any relief of their symptoms with Cortisone injections. The last injections were not really very beneficial. She went on a cruise afterward, and reports severe pain for about a week after the injections. She said the knees are bothering her badly and is ready to get them fixed. She is ready to proceed with the rihgt knee surgery but would like to get a cortisone injection into the left knee at tthe same time. They have been treated conservatively in the past for the above stated problem and despite conservative measures, they continue to have progressive pain and severe functional limitations and dysfunction. They have failed non-operative management including home exercise, medications, and injections. It is felt that they would benefit from undergoing total joint replacement. Risks and benefits of the procedure have been discussed with the patient and they elect to proceed with surgery. There are no active contraindications to surgery such as  ongoing infection or rapidly progressive neurological disease.  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 Congestive Heart Failure  Maternal Grandmother, Paternal Grandmother. Depression  Father, Mother. Cancer  Mother, Sister. Rheumatoid Arthritis  Father. Severe allergy  Mother. Osteoarthritis  Father, Maternal Grandmother, Mother. Heart Disease  Father, Maternal Grandfather, Paternal Grandfather. Diabetes Mellitus  Father, Maternal Grandfather.  Social History Never consumed alcohol  11/27/2013: Never consumed alcohol Most recent primary occupation  pharmacist Marital status  married Exercise  Exercises weekly; does other Children  2 Current work status  working part time Living situation  live with spouse Tobacco / smoke exposure  11/27/2013: yes outdoors only No history of drug/alcohol rehab  Tobacco use  Never smoker. 11/27/2013 Not under pain contract  Number of flights of stairs before winded  1  Medication History Tart Cherry Advanced (Oral) Active. Excedrin Migraine (250-250-65MG  Tablet, Oral as needed) Active. Butalbital-APAP-Caffeine (Oral) Specific strength unknown - Active. (prn) AmLODIPine Besylate (10MG  Tablet, Oral) Active. (1qd) Voltaren (75MG  Tablet DR, Oral) Active. (1 BID) Levothyroxine Sodium ( Tablet, Oral) Active. Losartan Potassium (25MG  Tablet, Oral)  Active. Cymbalta (30MG  Capsule DR Part, Oral) Active. Premarin (0.9MG  Tablet, Oral)  Active. Xanax (0.5MG  Tablet, Oral three times daily) Active. Nortriptyline HCl (  Capsule, Oral at bedtime, as needed) Active. Calcium 600 (  Tablet, Oral) Active. Vitamin D (2000UNIT Capsule, Oral) Active. Creon (12000UNIT Capsule DR Part, Oral) Active. (PRN) Timolol Maleate (0.5% Solution, Ophthalmic two times daily) Active. Travatan Z (0.004% Solution, Ophthalmic) Active. Crestor (  Tablet, Oral) Active.  Past Surgical History Dilation and Curettage of Uterus - Multiple  Hysterectomy  Date: 2015. complete (non-cancerous) Gallbladder Surgery  Date: 2014. laporoscopic Breast Reconstruction  bilateral; 1978, 1997  Review of Systems General Not Present- Chills, Fatigue, Fever, Memory Loss, Night Sweats, Weight Gain and Weight Loss. Skin Not Present- Eczema, Hives, Itching, Lesions and Rash. HEENT Present- Headache. Not Present- Dentures, Double Vision, Hearing Loss, Tinnitus and Visual Loss. Respiratory Present- Shortness of breath with exertion. Not Present- Allergies, Chronic Cough, Coughing up blood and Shortness of breath at rest. Cardiovascular Present- Murmur. Not Present- Chest Pain, Difficulty Breathing Lying Down, Palpitations, Racing/skipping heartbeats and Swelling. Gastrointestinal Not Present- Abdominal Pain, Bloody Stool, Constipation, Diarrhea, Difficulty Swallowing, Heartburn, Jaundice, Loss of appetitie, Nausea and Vomiting. Female Genitourinary Present- Urinating at Night. Not Present- Blood in Urine, Discharge, Flank Pain, Incontinence, Painful Urination, Urgency, Urinary frequency, Urinary Retention and Weak urinary stream. Musculoskeletal Present- Back Pain, Joint Pain and Morning Stiffness. Not Present- Joint Swelling, Muscle Pain, Muscle Weakness and Spasms. Neurological Not Present- Blackout spells, Difficulty with balance, Dizziness, Paralysis, Tremor and  Weakness. Psychiatric Not Present- Insomnia.  Vitals Weight: 120 lb Height: 62.5in Body Surface Area: 1.55 m Body Mass Index: 21.6 kg/m  Pulse: 68 (Regular)  BP: 144/84 (Sitting, Right Arm, Standard)  Physical Exam General Mental Status -Alert, cooperative and good historian. General Appearance-pleasant, Not in acute distress. Orientation-Oriented X3. Build & Nutrition-Well nourished and Well developed.  Head and Neck Head-normocephalic, atraumatic . Neck Global Assessment - supple, no bruit auscultated on the right, no bruit auscultated on the left.  Eye Vision-Wears corrective lenses. Pupil - Bilateral-Regular and Round. Motion - Bilateral-EOMI.  Chest and Lung Exam Auscultation Breath sounds - clear at anterior chest wall and clear at posterior chest wall. Adventitious sounds - No Adventitious sounds.  Cardiovascular Auscultation Rhythm - Regular rate and rhythm. Heart Sounds - S1 WNL and S2 WNL. Murmurs & Other Heart Sounds - Auscultation of the heart reveals - No Murmurs.  Abdomen Palpation/Percussion Tenderness - Abdomen is non-tender to palpation. Rigidity (guarding) - Abdomen is soft. Auscultation Auscultation of the abdomen reveals - Bowel sounds normal.  Female Genitourinary Note: Not done, not pertinent to present illness   Musculoskeletal Note: On exam, she is in no distress. Both knees show no effusion. Right knee range about 5 to 120. There is marked crepitus on range of motion. Tenderness medial greater than lateral and no instability. Left knee, no effusion. Range about 5 to 130. Slight crepitus on range of motion. Tender medial greater than lateral and no instability.  RADIOGRAPHS I reviewed her radiographs, she does have bone-on-bone arthritis in the medial and patellofemoral compartments of the right knee. Left knee has arthritis, but to a lesser degree.  Assessment & Plan Primary osteoarthritis of left knee  (M17.12) Primary osteoarthritis of right knee (M17.11)  Note:Surgical Plans: Right Total Knee Replacement and a Left Knee Cortisone injection  Disposition: Home  PCP: Dr. Cleta Alberts - Patient has been seen preoperatively and felt to be stable for surgery. Cards: Dr. Sherril Croon - Patient has been seen preoperatively and felt to be stable for surgery.  IV TXA  Anesthesia Issues: None  Veritas Study Patient Traditional Therapy  Signed electronically by Beckey Rutter, III PA-C

## 2015-08-25 NOTE — Interval H&P Note (Signed)
History and Physical Interval Note:  08/25/2015 6:40 AM  Kristy Arias  has presented today for surgery, with the diagnosis of right knee osteoarthritis  The various methods of treatment have been discussed with the patient and family. After consideration of risks, benefits and other options for treatment, the patient has consented to  Procedure(s): RIGHT TOTAL KNEE ARTHROPLASTY (Right) as a surgical intervention .  The patient's history has been reviewed, patient examined, no change in status, stable for surgery.  I have reviewed the patient's chart and labs.  Questions were answered to the patient's satisfaction.     Loanne Drilling

## 2015-08-25 NOTE — Anesthesia Preprocedure Evaluation (Addendum)
Anesthesia Evaluation  Patient identified by MRN, date of birth, ID band Patient awake    Reviewed: Allergy & Precautions, H&P , NPO status , Patient's Chart, lab work & pertinent test results  Airway Mallampati: II  TM Distance: >3 FB Neck ROM: Full    Dental  (+) Teeth Intact, Caps, Dental Advisory Given   Pulmonary shortness of breath and with exertion,    Pulmonary exam normal breath sounds clear to auscultation       Cardiovascular hypertension, Pt. on medications Normal cardiovascular exam Rhythm:Regular Rate:Normal  "Syndrome X" per cardiologist evaluation; cardiac cath. negative for CAD   Neuro/Psych negative neurological ROS  negative psych ROS   GI/Hepatic negative GI ROS, Neg liver ROS,   Endo/Other  Hypothyroidism   Renal/GU negative Renal ROS  negative genitourinary   Musculoskeletal negative musculoskeletal ROS (+)   Abdominal   Peds negative pediatric ROS (+)  Hematology negative hematology ROS (+)   Anesthesia Other Findings Multiple caps upper incisors.  Reproductive/Obstetrics negative OB ROS                             Anesthesia Physical  Anesthesia Plan  ASA: II  Anesthesia Plan: Spinal   Post-op Pain Management:    Induction: Intravenous  Airway Management Planned: Simple Face Mask  Additional Equipment:   Intra-op Plan:   Post-operative Plan:   Informed Consent: I have reviewed the patients History and Physical, chart, labs and discussed the procedure including the risks, benefits and alternatives for the proposed anesthesia with the patient or authorized representative who has indicated his/her understanding and acceptance.   Dental advisory given  Plan Discussed with: CRNA  Anesthesia Plan Comments:        Anesthesia Quick Evaluation

## 2015-08-25 NOTE — Evaluation (Signed)
Physical Therapy Evaluation Patient Details Name: Kristy Arias MRN: [id number] DOB: 1950-04-20 Today's Date: 08/25/2015   History of Present Illness  65 yo female s/p R TKA, L knee injection 08/25/15  Clinical Impression  On eval POD 0, pt required Min assist for mobility. She was able to take a few steps in room. Distance limited by dizziness. BP 130/45 once seated in recliner. Will follow and progress activity as tolerated.     Follow Up Recommendations Home health PT;Supervision - Intermittent    Equipment Recommendations  Rolling walker with 5" wheels    Recommendations for Other Services       Precautions / Restrictions Precautions Precautions: Fall Required Braces or Orthoses: Knee Immobilizer - Right Knee Immobilizer - Right: Discontinue once straight leg raise with < 10 degree lag Restrictions Weight Bearing Restrictions: No RLE Weight Bearing: Weight bearing as tolerated      Mobility  Bed Mobility Overal bed mobility: Needs Assistance Bed Mobility: Supine to Sit     Supine to sit: Min assist;HOB elevated     General bed mobility comments: Assist for R LE off bed. Increased time. VCs safety, technique.   Transfers Overall transfer level: Needs assistance Equipment used: Rolling walker (2 wheeled) Transfers: Sit to/from Stand Sit to Stand: Min assist;From elevated surface         General transfer comment: VCs safety, technique, hand/LE placement. assist to rise, stabilize, control descent.  Stand piviot with RW.   Ambulation/Gait Ambulation/Gait assistance: Min assist Ambulation Distance (Feet): 3 Feet Assistive device: Rolling walker (2 wheeled) Gait Pattern/deviations: Step-to pattern;Antalgic     General Gait Details: VCs safety, technique, sequence. Distance limited by dizziness.   Stairs            Wheelchair Mobility    Modified Rankin (Stroke Patients Only)       Balance                                             Pertinent Vitals/Pain Pain Assessment: 0-10 Pain Score: 5  Pain Location: R knee Pain Descriptors / Indicators: Aching;Sore Pain Intervention(s): Premedicated before session;Repositioned;Ice applied    Home Living Family/patient expects to be discharged to:: Private residence Living Arrangements: Spouse/significant other Available Help at Discharge: Family Type of Home: House Home Access: Stairs to enter Entrance Stairs-Rails: Right Entrance Stairs-Number of Steps: 2 Home Layout: Able to live on main level with bedroom/bathroom Home Equipment: None      Prior Function Level of Independence: Independent               Hand Dominance        Extremity/Trunk Assessment   Upper Extremity Assessment: Overall WFL for tasks assessed           Lower Extremity Assessment: LLE deficits/detail;RLE deficits/detail RLE Deficits / Details: hip flex at least 2/5, moves ankle well LLE Deficits / Details: Limited ROM. Knee flex ~80 degrees EOB with some discomfort  Cervical / Trunk Assessment: Normal  Communication   Communication: No difficulties  Cognition Arousal/Alertness: Awake/alert Behavior During Therapy: WFL for tasks assessed/performed Overall Cognitive Status: Within Functional Limits for tasks assessed                      General Comments      Exercises        Assessment/Plan    PT  Assessment Patient needs continued PT services  PT Diagnosis Difficulty walking;Acute pain   PT Problem List Decreased strength;Decreased range of motion;Decreased activity tolerance;Decreased balance;Decreased mobility;Pain;Decreased knowledge of use of DME  PT Treatment Interventions DME instruction;Gait training;Balance training;Functional mobility training;Therapeutic exercise;Therapeutic activities;Stair training;Patient/family education   PT Goals (Current goals can be found in the Care Plan section) Acute Rehab PT Goals Patient Stated Goal: regain  independence PT Goal Formulation: With patient/family Time For Goal Achievement: 09/08/15 Potential to Achieve Goals: Good    Frequency 7X/week   Barriers to discharge        Co-evaluation               End of Session Equipment Utilized During Treatment: Right knee immobilizer Activity Tolerance: Patient limited by pain (Limited by dizziness) Patient left: in chair;with call bell/phone within reach;with family/visitor present           Time: 2778-2423 PT Time Calculation (min) (ACUTE ONLY): 15 min   Charges:   PT Evaluation $PT Eval Low Complexity: 1 Procedure     PT G Codes:        Rebeca Alert, MPT Pager: [phone]

## 2015-08-25 NOTE — Anesthesia Procedure Notes (Addendum)
Spinal  Patient location during procedure: OR Staffing Anesthesiologist: Montez Hageman Performed: anesthesiologist  Preanesthetic Checklist Completed: patient identified, site marked, surgical consent, pre-op evaluation, timeout performed, IV checked, risks and benefits discussed and monitors and equipment checked Spinal Block Patient position: sitting Prep: Betadine Patient monitoring: heart rate, continuous pulse ox and blood pressure Approach: right paramedian Location: L3-4 Injection technique: single-shot Needle Needle type: Sprotte  Needle gauge: 25 G Needle length: 9 cm Additional Notes Expiration date of kit checked and confirmed. Patient tolerated procedure well, without complications.

## 2015-08-25 NOTE — Transfer of Care (Signed)
Immediate Anesthesia Transfer of Care Note  Patient: Kristy Arias  Procedure(s) Performed: Procedure(s): RIGHT TOTAL KNEE ARTHROPLASTY (Right)  Patient Location: PACU  Anesthesia Type:Spinal  Level of Consciousness: awake, alert  and oriented  Airway & Oxygen Therapy: Patient Spontanous Breathing and Patient connected to face mask oxygen  Post-op Assessment: Report given to RN and Post -op Vital signs reviewed and stable  Post vital signs: Reviewed and stable  Last Vitals:  Vitals:   08/25/15 0506  BP: 135/81  Pulse: 75  Resp: 18  Temp: 36.7 C    Last Pain:  Vitals:   08/25/15 0506  TempSrc: Oral         Complications: No apparent anesthesia complications

## 2015-08-25 NOTE — Anesthesia Postprocedure Evaluation (Signed)
Anesthesia Post Note  Patient: Kristy Arias  Procedure(s) Performed: Procedure(s) (LRB): RIGHT TOTAL KNEE ARTHROPLASTY (Right)  Patient location during evaluation: PACU Anesthesia Type: Spinal Level of consciousness: awake and alert Pain management: pain level controlled Vital Signs Assessment: post-procedure vital signs reviewed and stable Respiratory status: spontaneous breathing and respiratory function stable Cardiovascular status: blood pressure returned to baseline and stable Postop Assessment: no headache, no backache and spinal receding Anesthetic complications: no    Last Vitals:  Vitals:   08/25/15 1000 08/25/15 1101  BP: (!) 145/66 (!) 160/68  Pulse: 64 68  Resp: 18 18  Temp: 36.6 C     Last Pain:  Vitals:   08/25/15 1059  TempSrc:   PainSc: 3                  Phillips Grout

## 2015-08-25 NOTE — Op Note (Addendum)
Pre-operative diagnosis- Osteoarthritis  Bilateral knee(s)  Post-operative diagnosis- Osteoarthritis Bilateral knee(s)  Procedure-  Right  Total Knee Arthroplasty (Depuy Attune)   Left knee cortisone injection Surgeon- Gus Rankin. Shonteria Abeln, MD  Assistant- Leilani Able, PA-C   Anesthesia-  Spinal  EBL-* No blood loss amount entered *   Drains Hemovac  Tourniquet time-  Total Tourniquet Time Documented: Thigh (Right) - 32 minutes Total: Thigh (Right) - 32 minutes     Complications- None  Condition-PACU - hemodynamically stable.   Brief Clinical Note  Kristy Arias is a 65 y.o. year old female with end stage OA of her right knee with progressively worsening pain and dysfunction. She has constant pain, with activity and at rest and significant functional deficits with difficulties even with ADLs. She has had extensive non-op management including analgesics, injections of cortisone and viscosupplements, and home exercise program, but remains in significant pain with significant dysfunction.Radiographs show bone on bone arthritis medial and patellofemoral. She presents now for right Total Knee Arthroplasty.  She also has significant arthritis in her left knee and requests cortisone injection.  Procedure in detail---   The patient is brought into the operating room and positioned supine on the operating table. After successful administration of  Spinal,   a tourniquet is placed high on the  Right thigh(s) and the lower extremity is prepped and draped in the usual sterile fashion. Time out is performed by the operating team and then the  Right lower extremity is wrapped in Esmarch, knee flexed and the tourniquet inflated to 300 mmHg.       A midline incision is made with a ten blade through the subcutaneous tissue to the level of the extensor mechanism. A fresh blade is used to make a medial parapatellar arthrotomy. Soft tissue over the proximal medial tibia is subperiosteally elevated to the  joint line with a knife and into the semimembranosus bursa with a Cobb elevator. Soft tissue over the proximal lateral tibia is elevated with attention being paid to avoiding the patellar tendon on the tibial tubercle. The patella is everted, knee flexed 90 degrees and the ACL and PCL are removed. Findings are bone on bone medial and patellofemoral with large gmedial osteophytes.        The drill is used to create a starting hole in the distal femur and the canal is thoroughly irrigated with sterile saline to remove the fatty contents. The 5 degree Right  valgus alignment guide is placed into the femoral canal and the distal femoral cutting block is pinned to remove 9 mm off the distal femur. Resection is made with an oscillating saw.      The tibia is subluxed forward and the menisci are removed. The extramedullary alignment guide is placed referencing proximally at the medial aspect of the tibial tubercle and distally along the second metatarsal axis and tibial crest. The block is pinned to remove 56mm off the more deficient medial  side. Resection is made with an oscillating saw. Size 5is the most appropriate size for the tibia and the proximal tibia is prepared with the modular drill and keel punch for that size.      The femoral sizing guide is placed and size 5 is most appropriate. Rotation is marked off the epicondylar axis and confirmed by creating a rectangular flexion gap at 90 degrees. The size 5 cutting block is pinned in this rotation and the anterior, posterior and chamfer cuts are made with the oscillating saw. The intercondylar block  is then placed and that cut is made.      Trial size 5 tibial component, trial size 5 posterior stabilized femur and a 10  mm posterior stabilized rotating platform insert trial is placed. Full extension is achieved with excellent varus/valgus and anterior/posterior balance throughout full range of motion. The patella is everted and thickness measured to be 20  mm.  Free hand resection is taken to 12 mm, a 35 template is placed, lug holes are drilled, trial patella is placed, and it tracks normally. Osteophytes are removed off the posterior femur with the trial in place. All trials are removed and the cut bone surfaces prepared with pulsatile lavage. Cement is mixed and once ready for implantation, the size 5 tibial implant, size  5 posterior stabilized femoral component, and the size 35 patella are cemented in place and the patella is held with the clamp. The trial insert is placed and the knee held in full extension. The Exparel (20 ml mixed with 30 ml saline) and .25% Bupivicaine, are injected into the extensor mechanism, posterior capsule, medial and lateral gutters and subcutaneous tissues.  All extruded cement is removed and once the cement is hard the permanent 10 mm posterior stabilized rotating platform insert is placed into the tibial tray.      The wound is copiously irrigated with saline solution and the extensor mechanism closed over a hemovac drain with #1 V-loc suture. The tourniquet is released for a total tourniquet time of 32  minutes. Flexion against gravity is 140 degrees and the patella tracks normally. Subcutaneous tissue is closed with 2.0 vicryl and subcuticular with running 4.0 Monocryl. The incision is cleaned and dried and steri-strips and a bulky sterile dressing are applied. The limb is placed into a knee immobilizer.      I then prepped the left knee with betadine and injected 80 mg Depomedrol into the knee without problems. The patient is then awakened and transported to recovery in stable condition.      Please note that a surgical assistant was a medical necessity for this procedure in order to perform it in a safe and expeditious manner. Surgical assistant was necessary to retract the ligaments and vital neurovascular structures to prevent injury to them and also necessary for proper positioning of the limb to allow for anatomic placement of  the prosthesis.   Gus Rankin Kristy Schlup, MD    08/25/2015, 8:11 AM

## 2015-08-26 LAB — BASIC METABOLIC PANEL
Anion gap: 8 (ref 5–15)
BUN: 8 mg/dL (ref 6–20)
CALCIUM: 8.3 mg/dL — AB (ref 8.9–10.3)
CO2: 26 mmol/L (ref 22–32)
Chloride: 102 mmol/L (ref 101–111)
Creatinine, Ser: 0.7 mg/dL (ref 0.44–1.00)
GFR calc Af Amer: >60 mL/min (ref >60)
GLUCOSE: 147 mg/dL — AB (ref 65–99)
Potassium: 3.5 mmol/L (ref 3.5–5.1)
Sodium: 136 mmol/L (ref 135–145)

## 2015-08-26 LAB — CBC
HEMATOCRIT: 33.8 % — AB (ref 36.0–46.0)
Hemoglobin: 10.9 g/dL — ABNORMAL LOW (ref 12.0–15.0)
MCH: 31 pg (ref 26.0–34.0)
MCHC: 32.2 g/dL (ref 30.0–36.0)
MCV: 96 fL (ref 78.0–100.0)
PLATELETS: 332 10*3/uL (ref 150–400)
RBC: 3.52 MIL/uL — ABNORMAL LOW (ref 3.87–5.11)
RDW: 13 % (ref 11.5–15.5)
WBC: 22.5 10*3/uL — ABNORMAL HIGH (ref 4.0–10.5)

## 2015-08-26 MED ORDER — HYDROMORPHONE HCL 2 MG PO TABS: 2.0000 mg | ORAL_TABLET | ORAL | 0 refills | Status: DC | PRN

## 2015-08-26 MED ORDER — HYDROMORPHONE HCL 2 MG PO TABS
2.0000 mg | ORAL_TABLET | ORAL | Status: DC | PRN
  Administered 2015-08-26: 4 mg via ORAL
  Administered 2015-08-26: 2 mg via ORAL
  Administered 2015-08-26: 4 mg via ORAL
  Administered 2015-08-26 (×2): 2 mg via ORAL
  Administered 2015-08-27 (×2): 4 mg via ORAL
  Filled 2015-08-26 (×4): qty 2
  Filled 2015-08-26: qty 1
  Filled 2015-08-26: qty 2
  Filled 2015-08-26: qty 1
  Filled 2015-08-26: qty 2

## 2015-08-26 MED ORDER — RIVAROXABAN 10 MG PO TABS: 10.0000 mg | ORAL_TABLET | Freq: Every day | ORAL | 0 refills | Status: DC

## 2015-08-26 MED ORDER — SODIUM CHLORIDE 0.9 % IV BOLUS (SEPSIS)
250.0000 mL | Freq: Once | INTRAVENOUS | Status: AC
  Administered 2015-08-26: 250 mL via INTRAVENOUS

## 2015-08-26 MED ORDER — METHOCARBAMOL 500 MG PO TABS: 500.0000 mg | ORAL_TABLET | Freq: Four times a day (QID) | ORAL | 0 refills | Status: DC | PRN

## 2015-08-26 NOTE — Discharge Summary (Signed)
Physician Discharge Summary   Patient ID: Kristy Arias MRN: [id number] DOB/AGE: 1950-11-08 65 y.o.  Admit date: 08/25/2015 Discharge date: 08/27/2015  Primary Diagnosis:  Osteoarthritis  Bilateral knee(s) Admission Diagnoses:  Past Medical History:  Diagnosis Date  . Anemia   . Anxiety   . Arthritis   . Depression   . GERD (gastroesophageal reflux disease)   . Glaucoma   . Headache   . Heart murmur   . Hyperlipidemia   . Hypertension   . Hypothyroidism   . Shortness of breath    syndrome x  . Syndrome X, cardiac [facility])    Discharge Diagnoses:   Principal Problem:   OA (osteoarthritis) of knee  Estimated body mass index is 21.24 kg/m as calculated from the following:   Height as of this encounter: 5' 2.5" (1.588 m).   Weight as of this encounter: 53.5 kg (118 lb).  Procedure:  Procedure(s) (LRB): RIGHT TOTAL KNEE ARTHROPLASTY (Right)   Consults: None  HPI: Kristy Arias is a 65 y.o. year old female with end stage OA of her right knee with progressively worsening pain and dysfunction. She has constant pain, with activity and at rest and significant functional deficits with difficulties even with ADLs. She has had extensive non-op management including analgesics, injections of cortisone and viscosupplements, and home exercise program, but remains in significant pain with significant dysfunction.Radiographs show bone on bone arthritis medial and patellofemoral. She presents now for right Total Knee Arthroplasty.  She also has significant arthritis in her left knee and requests cortisone injection. Laboratory Data: Admission on 08/25/2015  Component Date Value Ref Range Status  . WBC 08/26/2015 22.5* 4.0 - 10.5 K/uL Final  . RBC 08/26/2015 3.52* 3.87 - 5.11 MIL/uL Final  . Hemoglobin 08/26/2015 10.9* 12.0 - 15.0 g/dL Final  . HCT 08/26/2015 33.8* 36.0 - 46.0 % Final  . MCV 08/26/2015 96.0  78.0 - 100.0 fL Final  . MCH 08/26/2015 31.0  26.0 - 34.0 pg Final  . MCHC  08/26/2015 32.2  30.0 - 36.0 g/dL Final  . RDW 08/26/2015 13.0  11.5 - 15.5 % Final  . Platelets 08/26/2015 332  150 - 400 K/uL Final  . Sodium 08/26/2015 136  135 - 145 mmol/L Final  . Potassium 08/26/2015 3.5  3.5 - 5.1 mmol/L Final  . Chloride 08/26/2015 102  101 - 111 mmol/L Final  . CO2 08/26/2015 26  22 - 32 mmol/L Final  . Glucose, Bld 08/26/2015 147* 65 - 99 mg/dL Final  . BUN 08/26/2015 8  6 - 20 mg/dL Final  . Creatinine, Ser 08/26/2015 0.70  0.44 - 1.00 mg/dL Final  . Calcium 08/26/2015 8.3* 8.9 - 10.3 mg/dL Final  . GFR calc non Af Amer 08/26/2015 >60  >60 mL/min Final  . GFR calc Af Amer 08/26/2015 >60  >60 mL/min Final   Comment: (NOTE) The eGFR has been calculated using the CKD EPI equation. This calculation has not been validated in all clinical situations. eGFR's persistently <60 mL/min signify possible Chronic Kidney Disease.   Georgiann Hahn gap 08/26/2015 8  5 - 15 Final  Hospital Outpatient Visit on 08/19/2015  Component Date Value Ref Range Status  . aPTT 08/19/2015 30  24 - 37 seconds Final  . WBC 08/19/2015 7.6  4.0 - 10.5 K/uL Final  . RBC 08/19/2015 4.06  3.87 - 5.11 MIL/uL Final  . Hemoglobin 08/19/2015 12.9  12.0 - 15.0 g/dL Final  . HCT 08/19/2015 38.9  36.0 - 46.0 %  Final  . MCV 08/19/2015 95.8  78.0 - 100.0 fL Final  . MCH 08/19/2015 31.8  26.0 - 34.0 pg Final  . MCHC 08/19/2015 33.2  30.0 - 36.0 g/dL Final  . RDW 08/19/2015 13.0  11.5 - 15.5 % Final  . Platelets 08/19/2015 339  150 - 400 K/uL Final  . Sodium 08/19/2015 139  135 - 145 mmol/L Final  . Potassium 08/19/2015 4.6  3.5 - 5.1 mmol/L Final  . Chloride 08/19/2015 103  101 - 111 mmol/L Final  . CO2 08/19/2015 29  22 - 32 mmol/L Final  . Glucose, Bld 08/19/2015 97  65 - 99 mg/dL Final  . BUN 08/19/2015 10  6 - 20 mg/dL Final  . Creatinine, Ser 08/19/2015 0.86  0.44 - 1.00 mg/dL Final  . Calcium 08/19/2015 9.2  8.9 - 10.3 mg/dL Final  . Total Protein 08/19/2015 7.2  6.5 - 8.1 g/dL Final  . Albumin  08/19/2015 3.6  3.5 - 5.0 g/dL Final  . AST 08/19/2015 26  15 - 41 U/L Final  . ALT 08/19/2015 8* 14 - 54 U/L Final  . Alkaline Phosphatase 08/19/2015 66  38 - 126 U/L Final  . Total Bilirubin 08/19/2015 0.9  0.3 - 1.2 mg/dL Final  . GFR calc non Af Amer 08/19/2015 >60  >60 mL/min Final  . GFR calc Af Amer 08/19/2015 >60  >60 mL/min Final   Comment: (NOTE) The eGFR has been calculated using the CKD EPI equation. This calculation has not been validated in all clinical situations. eGFR's persistently <60 mL/min signify possible Chronic Kidney Disease.   . Anion gap 08/19/2015 7  5 - 15 Final  . Prothrombin Time 08/19/2015 13.5  11.6 - 15.2 seconds Final  . INR 08/19/2015 1.06  0.00 - 1.49 Final  . ABO/RH(D) 08/25/2015 O POS   Final  . Antibody Screen 08/25/2015 NEG   Final  . Sample Expiration 08/25/2015 08/28/2015   Final  . Extend sample reason 08/25/2015 NO TRANSFUSIONS OR PREGNANCY IN THE PAST 3 MONTHS   Final  . Color, Urine 08/19/2015 YELLOW  YELLOW Final  . APPearance 08/19/2015 CLEAR  CLEAR Final  . Specific Gravity, Urine 08/19/2015 1.018  1.005 - 1.030 Final  . pH 08/19/2015 6.0  5.0 - 8.0 Final  . Glucose, UA 08/19/2015 NEGATIVE  NEGATIVE mg/dL Final  . Hgb urine dipstick 08/19/2015 NEGATIVE  NEGATIVE Final  . Bilirubin Urine 08/19/2015 NEGATIVE  NEGATIVE Final  . Ketones, ur 08/19/2015 NEGATIVE  NEGATIVE mg/dL Final  . Protein, ur 08/19/2015 NEGATIVE  NEGATIVE mg/dL Final  . Nitrite 08/19/2015 NEGATIVE  NEGATIVE Final  . Leukocytes, UA 08/19/2015 NEGATIVE  NEGATIVE Final  . MRSA, PCR 08/19/2015 NEGATIVE  NEGATIVE Final  . Staphylococcus aureus 08/19/2015 NEGATIVE  NEGATIVE Final   Comment:        The Xpert SA Assay (FDA approved for NASAL specimens in patients over 52 years of age), is one component of a comprehensive surveillance program.  Test performance has been validated by [facility] for patients greater than or equal to 15 year old. It is not intended to  diagnose infection nor to guide or monitor treatment.   . ABO/RH(D) 08/20/2015 O POS   Final     X-Rays:No results found.  EKG: Orders placed or performed in visit on 06/22/12  . Exercise Tolerance Test     Hospital Course: Kristy Arias is a 65 y.o. who was admitted to [facility]. They were brought to the operating  room on 08/25/2015 and underwent Procedure(s): RIGHT TOTAL KNEE ARTHROPLASTY.  Patient tolerated the procedure well and was later transferred to the recovery room and then to the orthopaedic floor for postoperative care.  They were given PO and IV analgesics for pain control following their surgery.  They were given 24 hours of postoperative antibiotics of  Anti-infectives    Start     Dose/Rate Route Frequency Ordered Stop   08/25/15 1400  ceFAZolin (ANCEF) IVPB 2g/100 mL premix     2 g 200 mL/hr over 30 Minutes Intravenous Every 6 hours 08/25/15 1015 08/25/15 2040   08/25/15 0508  ceFAZolin (ANCEF) IVPB 2g/100 mL premix     2 g 200 mL/hr over 30 Minutes Intravenous On call to O.R. 08/25/15 5361 08/25/15 0734     and started on DVT prophylaxis in the form of Xarelto.   PT and OT were ordered for total joint protocol.  Discharge planning consulted to help with postop disposition and equipment needs.  Patient had a tough night on the evening of surgery but better the next day.  They started to get up OOB with therapy on day one. Hemovac drain was pulled without difficulty.  Continued to work with therapy into day two.  Dressing was changed on day two and the incision was healing well.  Patient was seen in rounds and was ready to go home.  Discharge home with home health Diet - Cardiac diet Follow up - in 2 weeks Activity - WBAT Disposition - Home Condition Upon Discharge - Good D/C Meds - See DC Summary DVT Prophylaxis - Xarelto  Discharge Instructions    Call MD / Call 911    Complete by:  As directed   If you experience chest pain or shortness of breath,  CALL 911 and be transported to the hospital emergency room.  If you develope a fever above 101 F, pus (white drainage) or increased drainage or redness at the wound, or calf pain, call your surgeon's office.   Change dressing    Complete by:  As directed   Change dressing daily with sterile 4 x 4 inch gauze dressing and apply TED hose. Do not submerge the incision under water.   Constipation Prevention    Complete by:  As directed   Drink plenty of fluids.  Prune juice may be helpful.  You may use a stool softener, such as Colace (over the counter) 100 mg twice a day.  Use MiraLax (over the counter) for constipation as needed.   Diet - low sodium heart healthy    Complete by:  As directed   Discharge instructions    Complete by:  As directed   Pick up stool softner and laxative for home use following surgery while on pain medications. Do not submerge incision under water. Please use good hand washing techniques while changing dressing each day. May shower starting three days after surgery. Please use a clean towel to pat the incision dry following showers. Continue to use ice for pain and swelling after surgery. Do not use any lotions or creams on the incision until instructed by your surgeon.   Postoperative Constipation Protocol  Constipation - defined medically as fewer than three stools per week and severe constipation as less than one stool per week.  One of the most common issues patients have following surgery is constipation.  Even if you have a regular bowel pattern at home, your normal regimen is likely to be disrupted due to multiple reasons  following surgery.  Combination of anesthesia, postoperative narcotics, change in appetite and fluid intake all can affect your bowels.  In order to avoid complications following surgery, here are some recommendations in order to help you during your recovery period.  Colace (docusate) - Pick up an over-the-counter form of Colace or another stool  softener and take twice a day as long as you are requiring postoperative pain medications.  Take with a full glass of water daily.  If you experience loose stools or diarrhea, hold the colace until you stool forms back up.  If your symptoms do not get better within 1 week or if they get worse, check with your doctor.  Dulcolax (bisacodyl) - Pick up over-the-counter and take as directed by the product packaging as needed to assist with the movement of your bowels.  Take with a full glass of water.  Use this product as needed if not relieved by Colace only.   MiraLax (polyethylene glycol) - Pick up over-the-counter to have on hand.  MiraLax is a solution that will increase the amount of water in your bowels to assist with bowel movements.  Take as directed and can mix with a glass of water, juice, soda, coffee, or tea.  Take if you go more than two days without a movement. Do not use MiraLax more than once per day. Call your doctor if you are still constipated or irregular after using this medication for 7 days in a row.  If you continue to have problems with postoperative constipation, please contact the office for further assistance and recommendations.  If you experience "the worst abdominal pain ever" or develop nausea or vomiting, please contact the office immediatly for further recommendations for treatment.   Take Xarelto for two and a half more weeks, then discontinue Xarelto. Once the patient has completed the blood thinner regimen, then take a Baby 81 mg Aspirin daily for three more weeks.   Do not put a pillow under the knee. Place it under the heel.    Complete by:  As directed   Do not sit on low chairs, stoools or toilet seats, as it may be difficult to get up from low surfaces    Complete by:  As directed   Driving restrictions    Complete by:  As directed   No driving until released by the physician.   Increase activity slowly as tolerated    Complete by:  As directed   Lifting  restrictions    Complete by:  As directed   No lifting until released by the physician.   Patient may shower    Complete by:  As directed   You may shower without a dressing once there is no drainage.  Do not wash over the wound.  If drainage remains, do not shower until drainage stops.   TED hose    Complete by:  As directed   Use stockings (TED hose) for 3 weeks on both leg(s).  You may remove them at night for sleeping.   Weight bearing as tolerated    Complete by:  As directed   Laterality:  right   Extremity:  Lower       Medication List    STOP taking these medications   diclofenac 75 MG EC tablet Commonly known as:  VOLTAREN   estrogens (conjugated) 0.9 MG tablet Commonly known as:  PREMARIN   MIDOL PO   TART CHERRY ADVANCED Caps     TAKE these medications  ALPRAZolam 0.5 MG tablet Commonly known as:  XANAX Take 1 tablet (0.5 mg total) by mouth at bedtime as needed for anxiety.   amLODipine 10 MG tablet Commonly known as:  NORVASC Take 1 tablet (10 mg total) by mouth daily. What changed:  additional instructions   butalbital-acetaminophen-caffeine 50-325-40 MG tablet Commonly known as:  FIORICET 1 tablet every 4-6 hours as needed for headache What changed:  how much to take  how to take this  when to take this  reasons to take this  additional instructions   DULoxetine 60 MG capsule Commonly known as:  CYMBALTA Take 60 mg by mouth every morning.   HYDROmorphone 2 MG tablet Commonly known as:  DILAUDID Take 1-2 tablets (2-4 mg total) by mouth every 3 (three) hours as needed for moderate pain or severe pain.   levothyroxine 50 MCG tablet Commonly known as:  SYNTHROID, LEVOTHROID Take 1 tablet (50 mcg total) by mouth daily. What changed:  when to take this   losartan 25 MG tablet Commonly known as:  COZAAR Take 1 tablet (25 mg total) by mouth daily. Take 1 tablet by mouth daily What changed:  additional instructions   methocarbamol 500 MG  tablet Commonly known as:  ROBAXIN Take 1 tablet (500 mg total) by mouth every 6 (six) hours as needed for muscle spasms.   rivaroxaban 10 MG Tabs tablet Commonly known as:  XARELTO Take 1 tablet (10 mg total) by mouth daily with breakfast. Take Xarelto for two and a half more weeks, then discontinue Xarelto. Once the patient has completed the blood thinner regimen, then take a Baby 81 mg Aspirin daily for three more weeks.   rosuvastatin 5 MG tablet Commonly known as:  CRESTOR Take 1 tablet (5 mg total) by mouth at bedtime.   temazepam 30 MG capsule Commonly known as:  RESTORIL Take 1 capsule (30 mg total) by mouth at bedtime as needed for sleep.   timolol 0.5 % ophthalmic solution Commonly known as:  BETIMOL Place 1 drop into both eyes 2 (two) times daily.   Travoprost (BAK Free) 0.004 % Soln ophthalmic solution Commonly known as:  TRAVATAN Place 1 drop into both eyes at bedtime.      Follow-up Information    [facility] .   Why:  home health physical therapy Contact information: [address] [place] [address] [phone]        Inc[facility] - [facility] .   Why:  rolling walker and 3n1 Contact information: [place] [address] [phone]        Gearlean Alf, MD. Schedule an appointment as soon as possible for a visit on 09/09/2015.   Specialty:  Orthopedic Surgery Why:  Call office at [phone] to setup appointment on Tuesday 09/09/2015 with Dr. Wynelle Link. Contact information: [address] [place] [address] [phone]           Signed: Arlee Muslim, PA-C Orthopaedic Surgery 08/26/2015, 10:11 PM

## 2015-08-26 NOTE — Progress Notes (Signed)
Physical Therapy Treatment Patient Details Name: Kristy Arias MRN: [id number] DOB: 11-25-1950 Today's Date: 08/26/2015    History of Present Illness 65 yo female s/p R TKA, L knee injection 08/25/15    PT Comments    Progressing with mobility. Increased pain with ROM exercises on today. Pt was able to walk ~45 feet on today. She denied dizziness during this session. Will continue to follow and progress activity as tolerated.   Follow Up Recommendations  Home health PT;Supervision - Intermittent     Equipment Recommendations  Rolling walker with 5" wheels    Recommendations for Other Services       Precautions / Restrictions Precautions Precautions: Fall Required Braces or Orthoses: Knee Immobilizer - Right Knee Immobilizer - Right: Discontinue once straight leg raise with < 10 degree lag Restrictions Weight Bearing Restrictions: No RLE Weight Bearing: Weight bearing as tolerated    Mobility  Bed Mobility   Bed Mobility: Supine to Sit     Supine to sit: Min assist;HOB elevated     General bed mobility comments: oob in recliner  Transfers Overall transfer level: Needs assistance Equipment used: Rolling walker (2 wheeled) Transfers: Sit to/from Stand Sit to Stand: Min assist         General transfer comment: small amount of assist to stabilize. VCs safety, hand/LE placement  Ambulation/Gait Ambulation/Gait assistance: Min guard Ambulation Distance (Feet): 45 Feet Assistive device: Rolling walker (2 wheeled) Gait Pattern/deviations: Step-to pattern;Antalgic     General Gait Details: close guard for safety. VCS safety, technique, sequence. slow gait speed. Pt denied dizziness this session   Stairs            Wheelchair Mobility    Modified Rankin (Stroke Patients Only)       Balance                                    Cognition Arousal/Alertness: Awake/alert Behavior During Therapy: WFL for tasks assessed/performed Overall  Cognitive Status: Within Functional Limits for tasks assessed                      Exercises Total Joint Exercises Ankle Circles/Pumps: AROM;Both;10 reps;Supine Quad Sets: AROM;Both;10 reps;Supine Heel Slides: AAROM;Right;10 reps;Supine Hip ABduction/ADduction: AAROM;Right;10 reps;Supine Straight Leg Raises: AAROM;Right;10 reps;Supine    General Comments        Pertinent Vitals/Pain Pain Assessment: 0-10 Pain Score: 5  Faces Pain Scale: Hurts even more Pain Location: R knee Pain Descriptors / Indicators: Aching;Sore Pain Intervention(s): Monitored during session;Ice applied    Home Living Family/patient expects to be discharged to:: Private residence Living Arrangements: Spouse/significant other Available Help at Discharge: Family         Home Equipment: None      Prior Function Level of Independence: Independent          PT Goals (current goals can now be found in the care plan section) Acute Rehab PT Goals Patient Stated Goal: regain independence Progress towards PT goals: Progressing toward goals    Frequency  7X/week    PT Plan Current plan remains appropriate    Co-evaluation             End of Session Equipment Utilized During Treatment: Right knee immobilizer Activity Tolerance: Patient tolerated treatment well Patient left: in chair;with call bell/phone within reach;with chair alarm set;with family/visitor present     Time: 4098-1191 PT Time Calculation (min) (ACUTE  ONLY): 29 min  Charges:  $Gait Training: 8-22 mins $Therapeutic Exercise: 8-22 mins                    G Codes:      Rebeca Alert, MPT Pager: [phone]

## 2015-08-26 NOTE — Progress Notes (Addendum)
   Subjective: 1 Day Post-Op Procedure(s) (LRB): RIGHT TOTAL KNEE ARTHROPLASTY (Right) Patient reports pain as moderate last night but better today. Patient seen in rounds with Dr. Lequita Halt. Patient is well, but has had some minor complaints of pain in the knee, requiring pain medications We will start therapy today.  Plan is to go Home after hospital stay.  Objective: Vital signs in last 24 hours: Temp:  [97.6 F (36.4 C)-98.3 F (36.8 C)] 98.3 F (36.8 C) (08/01 0610) Pulse Rate:  [64-90] 82 (08/01 0610) Resp:  [12-18] 14 (08/01 0610) BP: (134-185)/(66-87) 139/75 (08/01 0610) SpO2:  [98 %-100 %] 98 % (08/01 0610) Weight:  [53.5 kg (118 lb)] 53.5 kg (118 lb) (07/31 1304)  Intake/Output from previous day:  Intake/Output Summary (Last 24 hours) at 08/26/15 0846 Last data filed at 08/26/15 0612  Gross per 24 hour  Intake             2845 ml  Output             3370 ml  Net             -525 ml    Intake/Output this shift: No intake/output data recorded.  Labs:  Recent Labs  08/26/15 0417  HGB 10.9*    Recent Labs  08/26/15 0417  WBC 22.5*  RBC 3.52*  HCT 33.8*  PLT 332    Recent Labs  08/26/15 0417  NA 136  K 3.5  CL 102  CO2 26  BUN 8  CREATININE 0.70  GLUCOSE 147*  CALCIUM 8.3*   No results for input(s): LABPT, INR in the last 72 hours.  EXAM General - Patient is Alert, Appropriate and Oriented Extremity - Neurovascular intact Sensation intact distally Dorsiflexion/Plantar flexion intact Dressing - dressing C/D/I Motor Function - intact, moving foot and toes well on exam.  Hemovac pulled without difficulty.  Past Medical History:  Diagnosis Date  . Anemia   . Anxiety   . Arthritis   . Depression   . GERD (gastroesophageal reflux disease)   . Glaucoma   . Headache   . Heart murmur   . Hyperlipidemia   . Hypertension   . Hypothyroidism   . Shortness of breath    syndrome x  . Syndrome X, cardiac (HCC)     Assessment/Plan: 1 Day  Post-Op Procedure(s) (LRB): RIGHT TOTAL KNEE ARTHROPLASTY (Right) Principal Problem:   OA (osteoarthritis) of knee  Estimated body mass index is 21.24 kg/m as calculated from the following:   Height as of this encounter: 5' 2.5" (1.588 m).   Weight as of this encounter: 53.5 kg (118 lb). Advance diet Up with therapy Plan for discharge tomorrow Discharge home with home health  DVT Prophylaxis - Xarelto Weight-Bearing as tolerated to right leg D/C O2 and Pulse OX and try on Room [facility] Study Patient Traditional Therapy  Avel Peace, PA-C Orthopaedic Surgery 08/26/2015, 8:46 AM

## 2015-08-26 NOTE — Care Management Note (Signed)
Case Management Note  Patient Details  Name: CAREENA DEGRAFFENREID MRN: [id number] Date of Birth: 1950-07-16  Subjective/Objective:                  RIGHT TOTAL KNEE ARTHROPLASTY (Right) Action/Plan: Discharge planning Expected Discharge Date:   08/27/15              Expected Discharge Plan:  [place]  In-House Referral:     Discharge planning Services  CM Consult  Post Acute Care Choice:  Home Health Choice offered to:  Patient  DME Arranged:  3-N-1, Walker rolling DME Agency:  [facility] (now [facility])  [place] Arranged:  PT [place]:  [facility] (now [facility])  Status of Service:  Completed, signed off  If discussed at [employer], dates discussed:    Additional Comments: CM met with pt in room to offer choice of home health agency.  Pt chooses [facility] to render HHPT.  Referral given to [employer], Tim.  CM notified [facility] DME rep, Jermaine to please deliver the rolling walker and 3n1 to room prior to discharge.  NO other CM needs were communicated. Dellie Catholic, RN 08/26/2015, 11:33 AM

## 2015-08-26 NOTE — Progress Notes (Signed)
Physical Therapy Treatment Patient Details Name: Kristy Arias MRN: [id number] DOB: May 17, 1950 Today's Date: 08/26/2015    History of Present Illness 65 yo female s/p R TKA, L knee injection 08/25/15    PT Comments    Progressing well with mobility.   Follow Up Recommendations  Home health PT;Supervision - Intermittent     Equipment Recommendations  Rolling walker with 5" wheels    Recommendations for Other Services       Precautions / Restrictions Precautions Precautions: Fall Required Braces or Orthoses: Knee Immobilizer - Right Knee Immobilizer - Right: Discontinue once straight leg raise with < 10 degree lag Restrictions Weight Bearing Restrictions: No RLE Weight Bearing: Weight bearing as tolerated    Mobility  Bed Mobility Overal bed mobility: Needs Assistance Bed Mobility: Sit to Supine       Sit to supine: Min assist   General bed mobility comments: assist for R LE  Transfers Overall transfer level: Needs assistance Equipment used: Rolling walker (2 wheeled) Transfers: Sit to/from Stand Sit to Stand: Min guard         General transfer comment: close guard for safety VCs safety, hand/LE placement  Ambulation/Gait Ambulation/Gait assistance: Min guard Ambulation Distance (Feet): 85 Feet Assistive device: Rolling walker (2 wheeled) Gait Pattern/deviations: Step-to pattern;Step-through pattern;Decreased stride length     General Gait Details: close guard for safety. Pt beginning to adopt reciprocal gait pattern.   Stairs            Wheelchair Mobility    Modified Rankin (Stroke Patients Only)       Balance                                    Cognition Arousal/Alertness: Awake/alert Behavior During Therapy: WFL for tasks assessed/performed Overall Cognitive Status: Within Functional Limits for tasks assessed                      Exercises      General Comments        Pertinent Vitals/Pain Pain  Assessment: 0-10 Pain Score: 5  Pain Location: R knee Pain Descriptors / Indicators: Aching;Sore Pain Intervention(s): Monitored during session    Home Living                      Prior Function            PT Goals (current goals can now be found in the care plan section) Progress towards PT goals: Progressing toward goals    Frequency  7X/week    PT Plan Current plan remains appropriate    Co-evaluation             End of Session Equipment Utilized During Treatment: Right knee immobilizer Activity Tolerance: Patient tolerated treatment well Patient left: in bed;with call bell/phone within reach;with family/visitor present     Time: 4496-7591 PT Time Calculation (min) (ACUTE ONLY): 9 min  Charges:  $Gait Training: 8-22 mins $Therapeutic Exercise: 8-22 mins                    G Codes:      Rebeca Alert, MPT Pager: [phone]

## 2015-08-26 NOTE — Evaluation (Signed)
Occupational Therapy Evaluation Patient Details Name: Kristy Arias MRN: [id number] DOB: 09/14/1950 Today's Date: 08/26/2015    History of Present Illness 65 yo female s/p R TKA, L knee injection 08/25/15   Clinical Impression   Pt was admitted for the above.  She felt lightheaded during evaluation and will benefit from continued OT in acute setting.  Goals are for min guard level    Follow Up Recommendations  Supervision/Assistance - 24 hour    Equipment Recommendations  3 in 1 bedside comode    Recommendations for Other Services       Precautions / Restrictions Precautions Precautions: Fall Knee Immobilizer - Right: Discontinue once straight leg raise with < 10 degree lag Restrictions Weight Bearing Restrictions: No RLE Weight Bearing: Weight bearing as tolerated      Mobility Bed Mobility   Bed Mobility: Supine to Sit     Supine to sit: Min assist;HOB elevated     General bed mobility comments: Assist for R LE and LLE off bed. Increased time. VCs safety, technique.   Transfers   Equipment used: Rolling walker (2 wheeled)   Sit to Stand: Min assist;From elevated surface         General transfer comment: VCs safety, technique, hand/LE placement. assist to rise, stabilize, control descent.  Stand piviot with RW.     Balance                                            ADL Overall ADL's : Needs assistance/impaired     Grooming: Oral care;Set up;Standing   Upper Body Bathing: Set up;Sitting   Lower Body Bathing: Moderate assistance;Sit to/from stand   Upper Body Dressing : Set up;Sitting   Lower Body Dressing: Total assistance;Sit to/from stand   Toilet Transfer: Minimal assistance;Stand-pivot;RW (chair)             General ADL Comments: had planned to stand at sink for oral care, but pt felt lightheaded.  She has a h/o vagal responses. Transferred to chair.  Recommended 3:1 for home. She has a reacher:  explained adl uses,  but she isn't able to RLE at this time. Also supported L out of bed for comfort.  Educated on knee precautions     Vision     Perception     Praxis      Pertinent Vitals/Pain Pain Assessment: Faces Faces Pain Scale: Hurts even more Pain Location: R knee, across joint Pain Descriptors / Indicators: Aching Pain Intervention(s): Limited activity within patient's tolerance;Monitored during session;Premedicated before session;Repositioned;Ice applied     Hand Dominance     Extremity/Trunk Assessment Upper Extremity Assessment Upper Extremity Assessment: Overall WFL for tasks assessed (doesn't put much wt through L wrist due to IV)           Communication Communication Communication: No difficulties   Cognition Arousal/Alertness: Awake/alert Behavior During Therapy: WFL for tasks assessed/performed Overall Cognitive Status: Within Functional Limits for tasks assessed                     General Comments       Exercises       Shoulder Instructions      Home Living Family/patient expects to be discharged to:: Private residence Living Arrangements: Spouse/significant other Available Help at Discharge: Family  Bathroom Shower/Tub: Walk-in shower (will sponge bathe downstairs initially)   Bathroom Toilet: Handicapped height     Home Equipment: None          Prior Functioning/Environment Level of Independence: Independent             OT Diagnosis: Acute pain   OT Problem List: Decreased activity tolerance;Decreased strength;Decreased knowledge of use of DME or AE;Pain   OT Treatment/Interventions: Self-care/ADL training;DME and/or AE instruction;Patient/family education    OT Goals(Current goals can be found in the care plan section) Acute Rehab OT Goals Patient Stated Goal: regain independence OT Goal Formulation: With patient Time For Goal Achievement: 09/02/15 Potential to Achieve Goals: Good  OT Frequency: Min 2X/week    Barriers to D/C:            Co-evaluation              End of Session    Activity Tolerance: Patient tolerated treatment well Patient left: in chair;with call bell/phone within reach;with family/visitor present   Time: 7867-5449 OT Time Calculation (min): 26 min Charges:  OT General Charges $OT Visit: 1 Procedure OT Evaluation $OT Eval Low Complexity: 1 Procedure OT Treatments $Therapeutic Activity: 8-22 mins G-Codes:    Nobel Brar 2015/09/11, 9:07 AM  Marica Otter, OTR/L [phone] 09/11/2015

## 2015-08-26 NOTE — Discharge Instructions (Addendum)
° °Dr. Frank Aluisio °Total Joint Specialist °[facility] °[address] °[place], [place] [address] °[phone] ° °TOTAL KNEE REPLACEMENT POSTOPERATIVE DIRECTIONS ° °Knee Rehabilitation, Guidelines Following Surgery  °Results after knee surgery are often greatly improved when you follow the exercise, range of motion and muscle strengthening exercises prescribed by your doctor. Safety measures are also important to protect the knee from further injury. Any time any of these exercises cause you to have increased pain or swelling in your knee joint, decrease the amount until you are comfortable again and slowly increase them. If you have problems or questions, call your caregiver or physical therapist for advice.  ° °HOME CARE INSTRUCTIONS  °Remove items at home which could result in a fall. This includes throw rugs or furniture in walking pathways.  °· ICE to the affected knee every three hours for 30 minutes at a time and then as needed for pain and swelling.  Continue to use ice on the knee for pain and swelling from surgery. You may notice swelling that will progress down to the foot and ankle.  This is normal after surgery.  Elevate the leg when you are not up walking on it.   °· Continue to use the breathing machine which will help keep your temperature down.  It is common for your temperature to cycle up and down following surgery, especially at night when you are not up moving around and exerting yourself.  The breathing machine keeps your lungs expanded and your temperature down. °· Do not place pillow under knee, focus on keeping the knee straight while resting ° °DIET °You may resume your previous home diet once your are discharged from the hospital. ° °DRESSING / WOUND CARE / SHOWERING °You may shower 3 days after surgery, but keep the wounds dry during showering.  You may use an occlusive plastic wrap (Press'n Seal for example), NO SOAKING/SUBMERGING IN THE BATHTUB.  If the  bandage gets wet, change with a clean dry gauze.  If the incision gets wet, pat the wound dry with a clean towel. °You may start showering once you are discharged home but do not submerge the incision under water. Just pat the incision dry and apply a dry gauze dressing on daily. °Change the surgical dressing daily and reapply a dry dressing each time. ° °ACTIVITY °Walk with your walker as instructed. °Use walker as long as suggested by your caregivers. °Avoid periods of inactivity such as sitting longer than an hour when not asleep. This helps prevent blood clots.  °You may resume a sexual relationship in one month or when given the OK by your doctor.  °You may return to work once you are cleared by your doctor.  °Do not drive a car for 6 weeks or until released by you surgeon.  °Do not drive while taking narcotics. ° °WEIGHT BEARING °Weight bearing as tolerated with assist device (walker, cane, etc) as directed, use it as long as suggested by your surgeon or therapist, typically at least 4-6 weeks. ° °POSTOPERATIVE CONSTIPATION PROTOCOL °Constipation - defined medically as fewer than three stools per week and severe constipation as less than one stool per week. ° °One of the most common issues patients have following surgery is constipation.  Even if you have a regular bowel pattern at home, your normal regimen is likely to be disrupted due to multiple reasons following surgery.  Combination of anesthesia, postoperative narcotics, change in appetite and fluid intake all can affect your bowels.    In order to avoid complications following surgery, here are some recommendations in order to help you during your recovery period. ° °Colace (docusate) - Pick up an over-the-counter form of Colace or another stool softener and take twice a day as long as you are requiring postoperative pain medications.  Take with a full glass of water daily.  If you experience loose stools or diarrhea, hold the colace until you stool forms  back up.  If your symptoms do not get better within 1 week or if they get worse, check with your doctor. ° °Dulcolax (bisacodyl) - Pick up over-the-counter and take as directed by the product packaging as needed to assist with the movement of your bowels.  Take with a full glass of water.  Use this product as needed if not relieved by Colace only.  ° °MiraLax (polyethylene glycol) - Pick up over-the-counter to have on hand.  MiraLax is a solution that will increase the amount of water in your bowels to assist with bowel movements.  Take as directed and can mix with a glass of water, juice, soda, coffee, or tea.  Take if you go more than two days without a movement. °Do not use MiraLax more than once per day. Call your doctor if you are still constipated or irregular after using this medication for 7 days in a row. ° °If you continue to have problems with postoperative constipation, please contact the office for further assistance and recommendations.  If you experience "the worst abdominal pain ever" or develop nausea or vomiting, please contact the office immediatly for further recommendations for treatment. ° °ITCHING ° If you experience itching with your medications, try taking only a single pain pill, or even half a pain pill at a time.  You can also use Benadryl over the counter for itching or also to help with sleep.  ° °TED HOSE STOCKINGS °Wear the elastic stockings on both legs for three weeks following surgery during the day but you may remove then at night for sleeping. ° °MEDICATIONS °See your medication summary on the “After Visit Summary” that the nursing staff will review with you prior to discharge.  You may have some home medications which will be placed on hold until you complete the course of blood thinner medication.  It is important for you to complete the blood thinner medication as prescribed by your surgeon.  Continue your approved medications as instructed at time of  discharge. ° °PRECAUTIONS °If you experience chest pain or shortness of breath - call 911 immediately for transfer to the hospital emergency department.  °If you develop a fever greater that 101 F, purulent drainage from wound, increased redness or drainage from wound, foul odor from the wound/dressing, or calf pain - CONTACT YOUR SURGEON.   °                                                °FOLLOW-UP APPOINTMENTS °Make sure you keep all of your appointments after your operation with your surgeon and caregivers. You should call the office at the above phone number and make an appointment for approximately two weeks after the date of your surgery or on the date instructed by your surgeon outlined in the "After Visit Summary". ° ° °RANGE OF MOTION AND STRENGTHENING EXERCISES  °Rehabilitation of the knee is important following a knee injury or   an operation. After just a few days of immobilization, the muscles of the thigh which control the knee become weakened and shrink (atrophy). Knee exercises are designed to build up the tone and strength of the thigh muscles and to improve knee motion. Often times heat used for twenty to thirty minutes before working out will loosen up your tissues and help with improving the range of motion but do not use heat for the first two weeks following surgery. These exercises can be done on a training (exercise) mat, on the floor, on a table or on a bed. Use what ever works the best and is most comfortable for you Knee exercises include:  °Leg Lifts - While your knee is still immobilized in a splint or cast, you can do straight leg raises. Lift the leg to 60 degrees, hold for 3 sec, and slowly lower the leg. Repeat 10-20 times 2-3 times daily. Perform this exercise against resistance later as your knee gets better.  °Quad and Hamstring Sets - Tighten up the muscle on the front of the thigh (Quad) and hold for 5-10 sec. Repeat this 10-20 times hourly. Hamstring sets are done by pushing the  foot backward against an object and holding for 5-10 sec. Repeat as with quad sets.  °· Leg Slides: Lying on your back, slowly slide your foot toward your buttocks, bending your knee up off the floor (only go as far as is comfortable). Then slowly slide your foot back down until your leg is flat on the floor again. °· Angel Wings: Lying on your back spread your legs to the side as far apart as you can without causing discomfort.  °A rehabilitation program following serious knee injuries can speed recovery and prevent re-injury in the future due to weakened muscles. Contact your doctor or a physical therapist for more information on knee rehabilitation.  ° °IF YOU ARE TRANSFERRED TO A SKILLED REHAB FACILITY °If the patient is transferred to a skilled rehab facility following release from the hospital, a list of the current medications will be sent to the facility for the patient to continue.  When discharged from the skilled rehab facility, please have the facility set up the patient's Home Health Physical Therapy prior to being released. Also, the skilled facility will be responsible for providing the patient with their medications at time of release from the facility to include their pain medication, the muscle relaxants, and their blood thinner medication. If the patient is still at the rehab facility at time of the two week follow up appointment, the skilled rehab facility will also need to assist the patient in arranging follow up appointment in our office and any transportation needs. ° °MAKE SURE YOU:  °Understand these instructions.  °Get help right away if you are not doing well or get worse.  ° ° °Pick up stool softner and laxative for home use following surgery while on pain medications. °Do not submerge incision under water. °Please use good hand washing techniques while changing dressing each day. °May shower starting three days after surgery. °Please use a clean towel to pat the incision dry following  showers. °Continue to use ice for pain and swelling after surgery. °Do not use any lotions or creams on the incision until instructed by your surgeon. ° °Take Xarelto for two and a half more weeks, then discontinue Xarelto. °Once the patient has completed the blood thinner regimen, then take a Baby 81 mg Aspirin daily for three more weeks. ° ° ° °  Information on my medicine - XARELTO® (Rivaroxaban) ° °This medication education was reviewed with me or my healthcare representative as part of my discharge preparation.  The pharmacist that spoke with me during my hospital stay was:  Margaret Hitchins, Student-PharmD ° °Why was Xarelto® prescribed for you? °Xarelto® was prescribed for you to reduce the risk of blood clots forming after orthopedic surgery. The medical term for these abnormal blood clots is venous thromboembolism (VTE). ° °What do you need to know about xarelto® ? °Take your Xarelto® ONCE DAILY at the same time every day. °You may take it either with or without food. ° °If you have difficulty swallowing the tablet whole, you may crush it and mix in applesauce just prior to taking your dose. ° °Take Xarelto® exactly as prescribed by your doctor and DO NOT stop taking Xarelto® without talking to the doctor who prescribed the medication.  Stopping without other VTE prevention medication to take the place of Xarelto® may increase your risk of developing a clot. ° °After discharge, you should have regular check-up appointments with your healthcare provider that is prescribing your Xarelto®.   ° °What do you do if you miss a dose? °If you miss a dose, take it as soon as you remember on the same day then continue your regularly scheduled once daily regimen the next day. Do not take two doses of Xarelto® on the same day.  ° °Important Safety Information °A possible side effect of Xarelto® is bleeding. You should call your healthcare provider right away if you experience any of the following: °? Bleeding from an  injury or your nose that does not stop. °? Unusual colored urine (red or dark brown) or unusual colored stools (red or black). °? Unusual bruising for unknown reasons. °? A serious fall or if you hit your head (even if there is no bleeding). ° °Some medicines may interact with Xarelto® and might increase your risk of bleeding while on Xarelto®. To help avoid this, consult your healthcare provider or pharmacist prior to using any new prescription or non-prescription medications, including herbals, vitamins, non-steroidal anti-inflammatory drugs (NSAIDs) and supplements. ° °This website has more information on Xarelto®: www.xarelto.com. ° ° ° °

## 2015-08-27 LAB — CBC
HEMATOCRIT: 30.6 % — AB (ref 36.0–46.0)
Hemoglobin: 10.1 g/dL — ABNORMAL LOW (ref 12.0–15.0)
MCH: 32 pg (ref 26.0–34.0)
MCHC: 33 g/dL (ref 30.0–36.0)
MCV: 96.8 fL (ref 78.0–100.0)
Platelets: 301 10*3/uL (ref 150–400)
RBC: 3.16 MIL/uL — ABNORMAL LOW (ref 3.87–5.11)
RDW: 13.5 % (ref 11.5–15.5)
WBC: 20.7 10*3/uL — AB (ref 4.0–10.5)

## 2015-08-27 LAB — BASIC METABOLIC PANEL
ANION GAP: 8 (ref 5–15)
BUN: 9 mg/dL (ref 6–20)
CALCIUM: 8.7 mg/dL — AB (ref 8.9–10.3)
CO2: 29 mmol/L (ref 22–32)
Chloride: 100 mmol/L — ABNORMAL LOW (ref 101–111)
Creatinine, Ser: 0.67 mg/dL (ref 0.44–1.00)
GFR calc non Af Amer: >60 mL/min (ref >60)
Glucose, Bld: 141 mg/dL — ABNORMAL HIGH (ref 65–99)
Potassium: 3.3 mmol/L — ABNORMAL LOW (ref 3.5–5.1)
Sodium: 137 mmol/L (ref 135–145)

## 2015-08-27 MED ORDER — POTASSIUM CHLORIDE CRYS ER 20 MEQ PO TBCR
40.0000 meq | EXTENDED_RELEASE_TABLET | ORAL | Status: AC
  Administered 2015-08-27 (×2): 40 meq via ORAL
  Filled 2015-08-27 (×2): qty 2

## 2015-08-27 NOTE — Progress Notes (Signed)
   Subjective: 2 Days Post-Op Procedure(s) (LRB): RIGHT TOTAL KNEE ARTHROPLASTY (Right) Patient reports pain as mild.   Patient seen in rounds with Dr. Lequita Halt. Patient is well, and has had no acute complaints or problems Patient is ready to go home today  Objective: Vital signs in last 24 hours: Temp:  [97.7 F (36.5 C)-99.7 F (37.6 C)] 99.7 F (37.6 C) (08/02 0543) Pulse Rate:  [81-93] 93 (08/02 0543) Resp:  [16-18] 16 (08/02 0543) BP: (151-186)/(72-85) 151/72 (08/02 0543) SpO2:  [96 %-99 %] 98 % (08/02 0543)  Intake/Output from previous day:  Intake/Output Summary (Last 24 hours) at 08/27/15 0730 Last data filed at 08/27/15 0552  Gross per 24 hour  Intake           1201.5 ml  Output             1775 ml  Net           -573.5 ml    Intake/Output this shift: No intake/output data recorded.  Labs:  Recent Labs  08/26/15 0417 08/27/15 0436  HGB 10.9* 10.1*    Recent Labs  08/26/15 0417 08/27/15 0436  WBC 22.5* 20.7*  RBC 3.52* 3.16*  HCT 33.8* 30.6*  PLT 332 301    Recent Labs  08/26/15 0417 08/27/15 0436  NA 136 137  K 3.5 3.3*  CL 102 100*  CO2 26 29  BUN 8 9  CREATININE 0.70 0.67  GLUCOSE 147* 141*  CALCIUM 8.3* 8.7*   No results for input(s): LABPT, INR in the last 72 hours.  EXAM: General - Patient is Alert, Appropriate and Oriented Extremity - Neurovascular intact Sensation intact distally Dorsiflexion/Plantar flexion intact Incision - clean, dry, no drainage Motor Function - intact, moving foot and toes well on exam.   Assessment/Plan: 2 Days Post-Op Procedure(s) (LRB): RIGHT TOTAL KNEE ARTHROPLASTY (Right) Procedure(s) (LRB): RIGHT TOTAL KNEE ARTHROPLASTY (Right) Past Medical History:  Diagnosis Date  . Anemia   . Anxiety   . Arthritis   . Depression   . GERD (gastroesophageal reflux disease)   . Glaucoma   . Headache   . Heart murmur   . Hyperlipidemia   . Hypertension   . Hypothyroidism   . Shortness of breath    syndrome x  . Syndrome X, cardiac (HCC)    Principal Problem:   OA (osteoarthritis) of knee  Estimated body mass index is 21.24 kg/m as calculated from the following:   Height as of this encounter: 5' 2.5" (1.588 m).   Weight as of this encounter: 53.5 kg (118 lb). Up with therapy Discharge home with home health Diet - Cardiac diet Follow up - in 2 weeks Activity - WBAT Disposition - Home Condition Upon Discharge - Good D/C Meds - See DC Summary DVT Prophylaxis - Xarelto  Avel Peace, PA-C Orthopaedic Surgery 08/27/2015, 7:30 AM

## 2015-08-27 NOTE — Progress Notes (Signed)
Occupational Therapy Treatment Patient Details Name: Kristy Arias MRN: [id number] DOB: [birth year] Today's Date: 08/27/2015    History of present illness 65 yo female s/p R TKA, L knee injection 08/25/15   OT comments  All education completed and pt questions answered. Patient reports she feels ready to d/c home with family assistance. OT will sign off.   Follow Up Recommendations  Supervision/Assistance - 24 hour    Equipment Recommendations  3 in 1 bedside comode    Recommendations for Other Services      Precautions / Restrictions Precautions Precautions: Fall Required Braces or Orthoses: Knee Immobilizer - Right Knee Immobilizer - Right: Discontinue once straight leg raise with < 10 degree lag Restrictions Weight Bearing Restrictions: No RLE Weight Bearing: Weight bearing as tolerated       Mobility Bed Mobility                  Transfers                      Balance                                   ADL Overall ADL's : Needs assistance/impaired                                       General ADL Comments: Patient received sitting up in recliner, husband at bedside. Patient reports no lightheadedness and that she feels comfortable with toileting task after practicing last night and today with nursing staff. Patient plans to sponge bathe initially due to 1/2 bath on first floor. Patient declined grooming in standing tasks this date. Answered questions and patient feels ready for d/c home with family assist. OT will sign off.      Vision                     Perception     Praxis      Cognition   Behavior During Therapy: WFL for tasks assessed/performed Overall Cognitive Status: Within Functional Limits for tasks assessed                       Extremity/Trunk Assessment               Exercises     Shoulder Instructions       General Comments      Pertinent Vitals/ Pain       Pain  Assessment: 0-10 Pain Score: 4  Pain Descriptors / Indicators: Aching;Sore Pain Intervention(s): Limited activity within patient's tolerance  Home Living                                          Prior Functioning/Environment              Frequency       Progress Toward Goals  OT Goals(current goals can now be found in the care plan section)  Progress towards OT goals: Goals met/education completed, patient discharged from OT  Acute Rehab OT Goals Patient Stated Goal: regain independence  Plan All goals met and education completed, patient discharged from OT services    Co-evaluation  End of Session     Activity Tolerance Patient tolerated treatment well   Patient Left in chair;with call bell/phone within reach;with family/visitor present   Nurse Communication          Time: 9191-6606 OT Time Calculation (min): 8 min  Charges: OT General Charges $OT Visit: 1 Procedure OT Treatments $Self Care/Home Management : 8-22 mins  Shacoria Latif A 08/27/2015, 10:08 AM

## 2015-08-27 NOTE — Progress Notes (Signed)
Patient notified of discharge.  Educated on discharge instructions, follow-up appointment, and discharge medications.  Prescriptions reviewed and given to husband.  Patient belongings gathered by husband.  Reviewed dressing change instructions, shower instructions, when to call a doctor with patient and husband.  Patient stated understanding, AVS signed.  IV removed.  Patient has walker and bedside commode at house.  No questions or concerns at this time.

## 2015-08-27 NOTE — Progress Notes (Signed)
Physical Therapy Treatment Patient Details Name: Kristy Arias MRN: [id number] DOB: 1950/12/15 Today's Date: 08/27/2015    History of Present Illness 65 yo female s/p R TKA, L knee injection 08/25/15    PT Comments    Progressing well with mobility. Practiced/reviewed exercises, gait training, and stair training. All education completed. Ready to d/c from PT standpoint.   Follow Up Recommendations  Home health PT;Supervision - Intermittent     Equipment Recommendations  Rolling walker with 5" wheels    Recommendations for Other Services       Precautions / Restrictions Precautions Precautions: Fall Required Braces or Orthoses: Knee Immobilizer - Right Knee Immobilizer - Right: Discontinue once straight leg raise with < 10 degree lag Restrictions Weight Bearing Restrictions: No RLE Weight Bearing: Weight bearing as tolerated    Mobility  Bed Mobility               General bed mobility comments: oob in recliner  Transfers Overall transfer level: Needs assistance Equipment used: Rolling walker (2 wheeled) Transfers: Sit to/from Stand Sit to Stand: Min guard         General transfer comment: close guard for safety VCs safety, hand/LE placement  Ambulation/Gait Ambulation/Gait assistance: Min guard Ambulation Distance (Feet): 85 Feet Assistive device: Rolling walker (2 wheeled) Gait Pattern/deviations: Step-to pattern;Step-through pattern;Decreased stride length     General Gait Details: close guard for safety. Pt beginning to adopt reciprocal gait pattern.   Stairs Stairs: Yes Stairs assistance: Min assist Stair Management: One rail Left;Forwards;Step to pattern Number of Stairs: 5 General stair comments: 1 HHA, 1 handrail. VCs safety, technique, sequence. Assist to stabilize. Husband present and praticed with pt as well.   Wheelchair Mobility    Modified Rankin (Stroke Patients Only)       Balance                                     Cognition Arousal/Alertness: Awake/alert Behavior During Therapy: WFL for tasks assessed/performed Overall Cognitive Status: Within Functional Limits for tasks assessed                      Exercises Total Joint Exercises Ankle Circles/Pumps: AROM;Both;10 reps;Supine Quad Sets: AROM;Both;10 reps;Supine Heel Slides: AAROM;Right;10 reps;Supine Hip ABduction/ADduction: AAROM;Right;10 reps;Supine Straight Leg Raises: AAROM;Right;10 reps;Supine    General Comments        Pertinent Vitals/Pain Pain Assessment: 0-10 Pain Score: 5  Pain Location: R knee Pain Descriptors / Indicators: Aching;Sore Pain Intervention(s): Monitored during session;Ice applied;Repositioned    Home Living                      Prior Function            PT Goals (current goals can now be found in the care plan section) Acute Rehab PT Goals Patient Stated Goal: regain independence Progress towards PT goals: Progressing toward goals    Frequency  7X/week    PT Plan Current plan remains appropriate    Co-evaluation             End of Session Equipment Utilized During Treatment: Right knee immobilizer;Gait belt Activity Tolerance: Patient tolerated treatment well Patient left: in chair;with call bell/phone within reach;with family/visitor present     Time: 7989-2119 PT Time Calculation (min) (ACUTE ONLY): 36 min  Charges:  $Gait Training: 8-22 mins $Therapeutic Exercise: 8-22 mins  G Codes:      Weston Anna, MPT Pager: [phone]

## 2016-01-25 ENCOUNTER — Other Ambulatory Visit: Payer: Self-pay | Admitting: Emergency Medicine

## 2016-02-22 ENCOUNTER — Other Ambulatory Visit: Payer: Self-pay | Admitting: Family Medicine

## 2016-02-23 ENCOUNTER — Other Ambulatory Visit: Payer: Self-pay | Admitting: Emergency Medicine

## 2016-02-24 ENCOUNTER — Other Ambulatory Visit: Payer: Self-pay | Admitting: Emergency Medicine

## 2016-02-24 ENCOUNTER — Telehealth: Payer: Self-pay | Admitting: Emergency Medicine

## 2016-02-24 MED ORDER — LEVOTHYROXINE SODIUM 50 MCG PO TABS: 50.0000 ug | ORAL_TABLET | Freq: Every day | ORAL | 3 refills | Status: DC

## 2016-02-24 MED ORDER — AMLODIPINE BESYLATE 10 MG PO TABS: 10.0000 mg | ORAL_TABLET | Freq: Every day | ORAL | 3 refills | Status: DC

## 2016-02-24 MED ORDER — LOSARTAN POTASSIUM 25 MG PO TABS: 25.0000 mg | ORAL_TABLET | Freq: Every day | ORAL | 3 refills | Status: DC

## 2016-02-24 NOTE — Telephone Encounter (Signed)
Received refill request for Levothyroxine, Losartan, and Amlodipine. Former pt of Dr. Cleta Albertsaub, last seen 01/28/15. Pt has an upcoming new pt appt coming up on 03/18/16. Is it ok to send these refills in? Please advise.

## 2016-02-24 NOTE — Telephone Encounter (Signed)
Pharmacy called regarding the quantity on patient's amLODipine (NORVASC) 10 MG tablet Please advise   Pharmacy Phone: [phone]

## 2016-02-25 ENCOUNTER — Telehealth: Payer: Self-pay | Admitting: Emergency Medicine

## 2016-02-25 ENCOUNTER — Other Ambulatory Visit: Payer: Self-pay | Admitting: Emergency Medicine

## 2016-02-25 MED ORDER — AMLODIPINE BESYLATE 10 MG PO TABS: 10.0000 mg | ORAL_TABLET | Freq: Every day | ORAL | 3 refills | Status: DC

## 2016-02-25 MED ORDER — OSELTAMIVIR PHOSPHATE 75 MG PO CAPS: 75.0000 mg | ORAL_CAPSULE | Freq: Every day | ORAL | 0 refills | Status: DC

## 2016-02-25 NOTE — Telephone Encounter (Signed)
Called pt- she herself is not sick as of yet.  Would like tamiflu prophy- will rx for her

## 2016-02-25 NOTE — Telephone Encounter (Signed)
Relation to ZO:XWRUpt:self Call back number:[phone] Pharmacy: [facility] - , [place] - [address] [phone] (Phone) [phone] (Fax)     Reason for call:  Patient states mother has been dx with the flu requesting tamiflu, patient has a scheduled new patient appointment 03/18/16. Please advise

## 2016-03-17 ENCOUNTER — Telehealth: Payer: Self-pay

## 2016-03-17 NOTE — Telephone Encounter (Signed)
Pre visit call completed 

## 2016-03-18 ENCOUNTER — Encounter: Payer: Self-pay | Admitting: Family Medicine

## 2016-03-18 ENCOUNTER — Ambulatory Visit (INDEPENDENT_AMBULATORY_CARE_PROVIDER_SITE_OTHER): Payer: 59 | Admitting: Family Medicine

## 2016-03-18 VITALS — BP 135/86 | HR 64 | Temp 97.1°F | Ht 62.0 in | Wt 122.0 lb

## 2016-03-18 DIAGNOSIS — Z1321 Encounter for screening for nutritional disorder: Secondary | ICD-10-CM

## 2016-03-18 DIAGNOSIS — R413 Other amnesia: Secondary | ICD-10-CM | POA: Diagnosis not present

## 2016-03-18 DIAGNOSIS — Z130 Encounter for screening for diseases of the blood and blood-forming organs and certain disorders involving the immune mechanism: Secondary | ICD-10-CM | POA: Diagnosis not present

## 2016-03-18 DIAGNOSIS — E039 Hypothyroidism, unspecified: Secondary | ICD-10-CM

## 2016-03-18 DIAGNOSIS — E782 Mixed hyperlipidemia: Secondary | ICD-10-CM | POA: Diagnosis not present

## 2016-03-18 DIAGNOSIS — Z131 Encounter for screening for diabetes mellitus: Secondary | ICD-10-CM | POA: Diagnosis not present

## 2016-03-18 DIAGNOSIS — I10 Essential (primary) hypertension: Secondary | ICD-10-CM | POA: Diagnosis not present

## 2016-03-18 LAB — COMPREHENSIVE METABOLIC PANEL
ALK PHOS: 102 U/L (ref 39–117)
ALT: 10 U/L (ref 0–35)
AST: 13 U/L (ref 0–37)
Albumin: 4.2 g/dL (ref 3.5–5.2)
BILIRUBIN TOTAL: 0.4 mg/dL (ref 0.2–1.2)
BUN: 10 mg/dL (ref 6–23)
CO2: 26 mEq/L (ref 19–32)
Calcium: 9.5 mg/dL (ref 8.4–10.5)
Chloride: 105 mEq/L (ref 96–112)
Creatinine, Ser: 0.72 mg/dL (ref 0.40–1.20)
GFR: 86.8 mL/min (ref >60.00)
GLUCOSE: 89 mg/dL (ref 70–99)
Potassium: 3.5 mEq/L (ref 3.5–5.1)
Sodium: 139 mEq/L (ref 135–145)
TOTAL PROTEIN: 7 g/dL (ref 6.0–8.3)

## 2016-03-18 LAB — CBC
HCT: 39.2 % (ref 36.0–46.0)
HEMOGLOBIN: 13 g/dL (ref 12.0–15.0)
MCHC: 33.2 g/dL (ref 30.0–36.0)
MCV: 93.7 fl (ref 78.0–100.0)
PLATELETS: 296 10*3/uL (ref 150.0–400.0)
RBC: 4.18 Mil/uL (ref 3.87–5.11)
RDW: 13.6 % (ref 11.5–15.5)
WBC: 7.5 10*3/uL (ref 4.0–10.5)

## 2016-03-18 LAB — LIPID PANEL
CHOLESTEROL: 214 mg/dL — AB (ref 0–200)
HDL: 62.4 mg/dL (ref >39.00)
LDL Cholesterol: 126 mg/dL — ABNORMAL HIGH (ref 0–99)
NONHDL: 151.68
Total CHOL/HDL Ratio: 3
Triglycerides: 127 mg/dL (ref 0.0–149.0)
VLDL: 25.4 mg/dL (ref 0.0–40.0)

## 2016-03-18 LAB — TSH: TSH: 1.9 u[IU]/mL (ref 0.35–4.50)

## 2016-03-18 LAB — HEMOGLOBIN A1C: HEMOGLOBIN A1C: 5.4 % (ref 4.6–6.5)

## 2016-03-18 LAB — VITAMIN D 25 HYDROXY (VIT D DEFICIENCY, FRACTURES): VITD: 32.19 ng/mL (ref 30.00–100.00)

## 2016-03-18 NOTE — Patient Instructions (Signed)
It was very nice to see you today- I will be in touch with your labs asap If you want, you can stop the cholesterol med and just use fish oil/ diet; we can plan to repeat a cholesterol panel for you in about 3 months. I have ordered this for you as a lab visit only and you can have it drawn at your convenience

## 2016-03-18 NOTE — Progress Notes (Signed)
[name] [address], [address] [place], [place] [address] [phone]  Date:  03/18/2016   Name:  Kristy Arias   DOB:  11/25/1950   MRN:  [id number]  PCP:  Abbe Amsterdam, MD    Chief Complaint: Establish Care (Pt here est care. Would like to discuss other options for crestor. pt states that she does not feel her bp is well controlled. Pt will need refills on meds. )   History of Present Illness:  Kristy Arias is a 66 y.o. very pleasant female patient who presents with the following:  Here to establish care today.  Prior patient of Dr. Cleta Alberts.  Last CPE 01-28-2015.  Last hospital admission 08-25-2015 to 08-27-2015 for right Total Knee Arthroplasty.  Last labs completed on the day of discharge (08-27-2015).  Right knee replacement is going to well.  She is planning to have her left knee done this summer.  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  Eyes: Sees Dr. Lottie Dawson to manage her Glaucoma.  Will see another opthalmologist April 11th to eval cataract in right eye.  Will see optometrist Dr. Harriette Bouillon today.  Gyn: Dr. Konrad Dolores w/ [facility].  Last pap in December 2017.  No abnormal paps in the past couple of decades.  Takes Crestor 3 times per week (has Arias taking it likes this for several years).  Doesn't like the way that makes her have memory issues.  Has Arias taking a fish oil supplement, but ran out 2 weeks ago.  Concerned about this, because her mother has ALZ. She wonders if she might be able to stop using crestor or change to a less potent statin .  She IS currently taking her crestor on a regular basis She does not have any history of CAD  Admits to not having a healthy diet.  Got exercise bike after her knee surgery, uses it 15 mins 3 times per week.  Likes Zoomba, but it is hard with 1 bad knee.  Mammogram: scheduled in March 21st.    UTD on Influenza and Td vaccines.   Patient Active Problem  List   Diagnosis Date Noted  . OA (osteoarthritis) of knee 08/25/2015  . Memory loss 06/05/2014  . Chronic migraine without aura without status migrainosus, not intractable 06/05/2014  . Worsening headaches 06/05/2014  . Chronic cholecystitis with calculus s/p lap chole 06/29/2012 04/13/2012  . Abdominal pain, epigastric 04/13/2012  . Heartburn 04/13/2012  . Hypertension 11/02/2011  . Fibromuscular dysplasia (HCC) 11/02/2011  . Hyperlipidemia 11/02/2011  . Allergic rhinitis 11/02/2011  . Arthritis 11/02/2011  . Depression 11/02/2011    Past Medical History:  Diagnosis Date  . Anemia   . Anxiety   . Arthritis   . Depression   . GERD (gastroesophageal reflux disease)   . Glaucoma   . Headache   . Heart murmur   . Hyperlipidemia   . Hypertension   . Hypothyroidism   . Shortness of breath    syndrome x  . Syndrome X, cardiac [facility])     Past Surgical History:  Procedure Laterality Date  . ABDOMINAL HYSTERECTOMY    . BREAST ENHANCEMENT SURGERY    . BREAST SURGERY    . CARDIAC CATHETERIZATION  4/14  . CHOLECYSTECTOMY    . COSMETIC SURGERY    . DILATION AND CURETTAGE OF UTERUS  2013  . LAPAROSCOPIC CHOLECYSTECTOMY SINGLE PORT N/A 06/29/2012   Procedure: LAPAROSCOPIC  CHOLECYSTECTOMY SINGLE PORT IOC ;  Surgeon: Ardeth Sportsman, MD;  Location: [facility] ORS;  Service: General;  Laterality: N/A;  . LEFT HEART CATHETERIZATION WITH CORONARY ANGIOGRAM N/A 05/23/2012   Procedure: LEFT HEART CATHETERIZATION WITH CORONARY ANGIOGRAM;  Surgeon: [name], MD;  Location: [facility] CATH LAB;  Service: Cardiovascular;  Laterality: N/A;  . TOTAL KNEE ARTHROPLASTY Right 08/25/2015   Procedure: RIGHT TOTAL KNEE ARTHROPLASTY;  Surgeon: Ollen Gross, MD;  Location: [facility];  Service: Orthopedics;  Laterality: Right;    Social History  Substance Use Topics  . Smoking status: Never Smoker  . Smokeless tobacco: Never Used  . Alcohol use No    Family History  Problem Relation Age of Onset  .  Hypertension Father   . Diabetes Father   . Heart disease Father   . Hyperlipidemia Father   . Hypertension Maternal Grandmother   . Heart disease Maternal Grandmother   . Diabetes Maternal Grandfather   . Hyperlipidemia Maternal Grandfather   . Heart disease Maternal Grandfather   . Osteoporosis Paternal Grandmother   . Heart disease Paternal Grandmother   . Heart disease Paternal Grandfather   . Cancer Mother   . Hyperlipidemia Mother   . Hypertension Mother   . Cancer Sister     No Known Allergies  Medication list has Arias reviewed and updated.  Current Outpatient Prescriptions on File Prior to Visit  Medication Sig Dispense Refill  . ALPRAZolam (XANAX) 0.5 MG tablet Take 1 tablet (0.5 mg total) by mouth at bedtime as needed for anxiety. 30 tablet 2  . amLODipine (NORVASC) 10 MG tablet Take 1 tablet (10 mg total) by mouth daily. 90 tablet 3  . APAP-Pamabrom-Pyrilamine 500-25-15 MG TABS Take by mouth.    Marland Kitchen aspirin 81 MG tablet Take 81 mg by mouth daily.    . butalbital-acetaminophen-caffeine (FIORICET) 50-325-40 MG per tablet 1 tablet every 4-6 hours as needed for headache (Patient taking differently: Take 1 tablet by mouth every 4 (four) hours as needed for headache. 1 tablet every 4-6 hours as needed for headache) 30 tablet 3  . diclofenac (VOLTAREN) 75 MG EC tablet take 1 tablet by mouth twice a day 120 tablet 0  . DULoxetine (CYMBALTA) 60 MG capsule Take 60 mg by mouth every morning.    Marland Kitchen estradiol (VIVELLE-DOT) 0.1 MG/24HR patch Place 1 patch onto the skin 2 (two) times a week.    . levothyroxine (SYNTHROID, LEVOTHROID) 50 MCG tablet Take 1 tablet (50 mcg total) by mouth daily. 90 tablet 3  . losartan (COZAAR) 25 MG tablet Take 1 tablet (25 mg total) by mouth daily. Take 1 tablet by mouth daily 90 tablet 3  . Melatonin 10 MG TABS Take by mouth.    . rosuvastatin (CRESTOR) 5 MG tablet Take 1 tablet (5 mg total) by mouth at bedtime. 30 tablet 11  . temazepam (RESTORIL) 30 MG  capsule Take 1 capsule (30 mg total) by mouth at bedtime as needed for sleep. 30 capsule 5  . timolol (BETIMOL) 0.5 % ophthalmic solution Place 1 drop into both eyes 2 (two) times daily.    . Travoprost, BAK Free, (TRAVATAN) 0.004 % SOLN ophthalmic solution Place 1 drop into both eyes at bedtime.      No current facility-administered medications on file prior to visit.     Review of Systems:  As per HPI- otherwise negative.   Physical Examination: Vitals:   03/18/16 1048  BP: 135/86  Pulse: 64  Temp: 97.1 F (36.2  C)   Vitals:   03/18/16 1048  Weight: 122 lb (55.3 kg)  Height: 5\' 2"  (1.575 m)   Body mass index is 22.31 kg/m. Ideal Body Weight: Weight in (lb) to have BMI = 25: 136.4  GEN: WDWN, NAD, Non-toxic, A & O x 3, looks well, normal weight HEENT: Atraumatic, Normocephalic. Neck supple. No masses, No LAD. Ears and Nose: No external deformity. CV: RRR, No M/G/R. No JVD. No thrill. No extra heart sounds. PULM: CTA B, no wheezes, crackles, rhonchi. No retractions. No resp. distress. No accessory muscle use. ABD: S, NT, ND EXTR: No c/c/e NEURO Normal gait.  PSYCH: Normally interactive. Conversant. Not depressed or anxious appearing.  Calm demeanor.    Assessment and Plan: Mixed hyperlipidemia - Plan: Lipid panel, Lipid panel  Essential hypertension - Plan: Comprehensive metabolic panel  Memory difficulty  Screening for deficiency anemia - Plan: CBC  Screening for diabetes mellitus - Plan: Hemoglobin A1c  Encounter for vitamin deficiency screening - Plan: Vitamin D (25 hydroxy)  Acquired hypothyroidism - Plan: TSH  Here today to establish care as Dr. Cleta Albertsaub is retiring She would like to try coming off her statin- will continue her fish oil and plan to repeat a lipid panel in about 3 months Other labs pending as above Family history of alzheimer's disease.  This is another reason she would like to come off any medication that could effect her memory    Signed Abbe AmsterdamJessica Shakima Nisley, MD

## 2016-03-22 ENCOUNTER — Other Ambulatory Visit: Payer: Self-pay | Admitting: Family Medicine

## 2016-03-22 NOTE — Telephone Encounter (Signed)
Caller name: Relationship to patient: Self Can be reached: [phone]  Pharmacy:  [id number] - [place], [place] - [address] [phone] (Phone) [phone] (Fax)     Reason for call: Refill on DULoxetine (CYMBALTA) 60 MG capsule  temazepam (RESTORIL) 30 MG capsule   levothyroxine (SYNTHROID, LEVOTHROID) 50 MCG tablet [[id number]

## 2016-03-23 ENCOUNTER — Other Ambulatory Visit: Payer: Self-pay | Admitting: Emergency Medicine

## 2016-03-23 MED ORDER — DULOXETINE HCL 60 MG PO CPEP: 60.0000 mg | ORAL_CAPSULE | ORAL | 3 refills | Status: DC

## 2016-03-23 NOTE — Telephone Encounter (Signed)
Sent to provider for approval on controlled medication.

## 2016-03-23 NOTE — Telephone Encounter (Signed)
Received refill request for DULoxetine (CYMBALTA) 60 MG capsule and temazepam (RESTORIL) 30 MG capsule. Last office visit 02/04/2016. Pt also requested levothyroxine but that was last filled on 02/24/16 for 90 day supply with 3 refills so this request is too soon.   Is it ok to refill? Please advise.

## 2016-03-23 NOTE — Telephone Encounter (Signed)
Patient is calling back regarding this issue. She would like a call back.

## 2016-03-23 NOTE — Telephone Encounter (Signed)
Called her back- it looks like she is taking both temazepam and alprazolam.  We need to discuss this further and get her onto one of the other; would prefer not to have her on both.  Please give me a call back with more details about her recent use of these medications

## 2016-03-24 MED ORDER — TEMAZEPAM 15 MG PO CAPS: ORAL_CAPSULE | ORAL | 2 refills | Status: DC

## 2016-03-24 NOTE — Addendum Note (Signed)
Addended by: Abbe AmsterdamOPLAND, Andre Gallego C on: 03/24/2016 04:47 PM   Modules accepted: Orders

## 2016-03-24 NOTE — Telephone Encounter (Signed)
Called her to clarify her medication use- she has both temazepam and xanax on her list.  She prefers the temazepam for sleep but only takes it 3x a week- the other nights she will take 2 of her xanax at HS.  Discussed this and decided that we will have her just on the temazepam as I do not see an advantage to having her on 2 benzos.  Will change to the 15 mg strength so she can try just 15 mg if sufficient.  She is happy with this plan   [facility]- last filled xanax on 1/29, #60, and temazepam on 12/31, #30 Has been getting these from Dr. Jennette KettleNeal  Meds ordered this encounter  Medications  . temazepam (RESTORIL) 15 MG capsule    Sig: Take 1 or 2 at bedtime as needed for sleep    Dispense:  60 capsule    Refill:  2

## 2016-04-18 ENCOUNTER — Other Ambulatory Visit: Payer: Self-pay | Admitting: Physician Assistant

## 2016-04-19 NOTE — Telephone Encounter (Signed)
Called her- she uses voltaren for her knee pain.  No history of any GI sx.  Will refill for her

## 2016-04-20 ENCOUNTER — Encounter: Payer: Self-pay | Admitting: Family Medicine

## 2016-05-20 ENCOUNTER — Telehealth: Payer: Self-pay

## 2016-05-20 NOTE — Telephone Encounter (Signed)
Received Surgical Clearance form from [facility]  Surgery Date:  08/09/16 Procedure:  Left: TKA--Media & Lateral W/WO Patella Resurfacing  Last OV: 03/18/16 Labs completed: 03/18/16 No recent EKG on file.  Please advise.

## 2016-05-26 NOTE — Telephone Encounter (Signed)
Spoke with pt- she had her RIGHT knee replaced last year without any difficulty. Her LEFT knee (the one she plans to have replaced this time) holds her back from some activites, but she is still able to do housework such as vacuuming and sweeping, and to carry groceries up stairs without any CP or SOB I got labs for her in February which were normal  We have not gotten a recent EKG, but she is a pt of Dr. Jacinto Halim who last saw her in 08/2015.  History of normal cardiac cath in 2015, EKG in 2017.    Will clear for her operation

## 2016-07-13 NOTE — Progress Notes (Signed)
Please place orders in EPIC as patient is being scheduled for a pre-op appointment! Thank you! 

## 2016-07-14 ENCOUNTER — Other Ambulatory Visit (INDEPENDENT_AMBULATORY_CARE_PROVIDER_SITE_OTHER): Payer: 59

## 2016-07-14 DIAGNOSIS — E782 Mixed hyperlipidemia: Secondary | ICD-10-CM

## 2016-07-14 DIAGNOSIS — Z818 Family history of other mental and behavioral disorders: Secondary | ICD-10-CM

## 2016-07-14 LAB — LIPID PANEL
CHOLESTEROL: 314 mg/dL — AB (ref 0–200)
HDL: 57.2 mg/dL (ref >39.00)
LDL Cholesterol: 222 mg/dL — ABNORMAL HIGH (ref 0–99)
NonHDL: 256.75
Total CHOL/HDL Ratio: 5
Triglycerides: 172 mg/dL — ABNORMAL HIGH (ref 0.0–149.0)
VLDL: 34.4 mg/dL (ref 0.0–40.0)

## 2016-07-16 ENCOUNTER — Encounter: Payer: Self-pay | Admitting: Family Medicine

## 2016-07-16 MED ORDER — METFORMIN HCL 500 MG PO TABS: ORAL_TABLET | ORAL | 5 refills | Status: DC

## 2016-07-16 NOTE — Progress Notes (Signed)
Received her repeat lipids- we had her try coming off her statin (crestor 3x a week) to see how she would do.  unfortunately her lipids are significantly worse. She would like to go back on her crestor which she uses 3-4x a week  Results for orders placed or performed in visit on 07/14/16  Lipid panel  Result Value Ref Range   Cholesterol 314 (H) 0 - 200 mg/dL   Triglycerides 161.0172.0 (H) 0.0 - 149.0 mg/dL   HDL 96.0457.20 >54.09>39.00 mg/dL   VLDL 81.134.4 0.0 - 91.440.0 mg/dL   LDL Cholesterol 782222 (H) 0 - 99 mg/dL   Total CHOL/HDL Ratio 5    NonHDL 256.75    Kristy Arias is concerned about her risk of alzheimer's disease and parkinson's disease.  Her mother has been dx with both.  She would like to try some metformin as she had read that it could help prevent these diseases.  Per my research there is mixed evidence about metformin's potential in this area. However I do not think it is likely to harm her so we will give it a try.  Will have her take 250 mg at bedtime.

## 2016-07-26 ENCOUNTER — Ambulatory Visit: Payer: Self-pay | Admitting: Orthopedic Surgery

## 2016-08-03 NOTE — Patient Instructions (Addendum)
Kristy Arias  08/03/2016   Your procedure is scheduled on: 08-09-16   [facility]  Entrance Report to Admitting at 6:55 AM   Call this number if you have problems the morning of surgery  [phone]   Remember: ONLY 1 PERSON MAY GO WITH YOU TO SHORT STAY TO GET  READY MORNING OF YOUR SURGERY.  Do not eat food or drink liquids :After Midnight.     Take these medicines the morning of surgery with A SIP OF WATER: Duloxetine (Cymbalta), Levothyroxine (Synthroid). You may also bring and use your  eyedrops as needed.                                 You may not have any metal on your body including hair pins and              piercings  Do not wear jewelry, make-up, lotions, powders or perfumes, deodorant             Do not wear nail polish.  Do not shave  48 hours prior to surgery.               Do not bring valuables to the hospital. [facility] IS NOT             RESPONSIBLE   FOR VALUABLES.  Contacts, dentures or bridgework may not be worn into surgery.  Leave suitcase in the car. After surgery it may be brought to your room.                 Please read over the following fact sheets you were given:   [facility] - Preparing for Surgery Before surgery, you can play an important role.  Because skin is not sterile, your skin needs to be as free of germs as possible.  You can reduce the number of germs on your skin by washing with CHG (chlorahexidine gluconate) soap before surgery.  CHG is an antiseptic cleaner which kills germs and bonds with the skin to continue killing germs even after washing. Please DO NOT use if you have an allergy to CHG or antibacterial soaps.  If your skin becomes reddened/irritated stop using the CHG and inform your nurse when you arrive at Short Stay. Do not shave (including legs and underarms) for at least 48 hours prior to the first CHG shower.  You may shave your face/neck. Please follow these instructions  carefully:  1.  Shower with CHG Soap the night before surgery and the  morning of Surgery.  2.  If you choose to wash your hair, wash your hair first as usual with your  normal  shampoo.  3.  After you shampoo, rinse your hair and body thoroughly to remove the  shampoo.                           4.  Use CHG as you would any other liquid soap.  You can apply chg directly  to the skin and wash                       Gently with a scrungie or clean washcloth.  5.  Apply the CHG Soap to your body ONLY FROM THE NECK DOWN.  Do not use on face/ open                           Wound or open sores. Avoid contact with eyes, ears mouth and genitals (private parts).                       Wash face,  Genitals (private parts) with your normal soap.             6.  Wash thoroughly, paying special attention to the area where your surgery  will be performed.  7.  Thoroughly rinse your body with warm water from the neck down.  8.  DO NOT shower/wash with your normal soap after using and rinsing off  the CHG Soap.                9.  Pat yourself dry with a clean towel.            10.  Wear clean pajamas.            11.  Place clean sheets on your bed the night of your first shower and do not  sleep with pets. Day of Surgery : Do not apply any lotions/deodorants the morning of surgery.  Please wear clean clothes to the hospital/surgery center.  FAILURE TO FOLLOW THESE INSTRUCTIONS MAY RESULT IN THE CANCELLATION OF YOUR SURGERY PATIENT SIGNATURE_________________________________  NURSE SIGNATURE__________________________________  ________________________________________________________________________   Adam Phenix  An incentive spirometer is a tool that can help keep your lungs clear and active. This tool measures how well you are filling your lungs with each breath. Taking long deep breaths may help reverse or decrease the chance of developing breathing (pulmonary) problems (especially infection)  following:  A long period of time when you are unable to move or be active. BEFORE THE PROCEDURE   If the spirometer includes an indicator to show your best effort, your nurse or respiratory therapist will set it to a desired goal.  If possible, sit up straight or lean slightly forward. Try not to slouch.  Hold the incentive spirometer in an upright position. INSTRUCTIONS FOR USE  1. Sit on the edge of your bed if possible, or sit up as far as you can in bed or on a chair. 2. Hold the incentive spirometer in an upright position. 3. Breathe out normally. 4. Place the mouthpiece in your mouth and seal your lips tightly around it. 5. Breathe in slowly and as deeply as possible, raising the piston or the ball toward the top of the column. 6. Hold your breath for 3-5 seconds or for as long as possible. Allow the piston or ball to fall to the bottom of the column. 7. Remove the mouthpiece from your mouth and breathe out normally. 8. Rest for a few seconds and repeat Steps 1 through 7 at least 10 times every 1-2 hours when you are awake. Take your time and take a few normal breaths between deep breaths. 9. The spirometer may include an indicator to show your best effort. Use the indicator as a goal to work toward during each repetition. 10. After each set of 10 deep breaths, practice coughing to be sure your lungs are clear. If you have an incision (the cut made at the time of surgery), support your incision when coughing by placing a pillow or rolled up towels firmly against it. Once you are able to get out of  bed, walk around indoors and cough well. You may stop using the incentive spirometer when instructed by your caregiver.  RISKS AND COMPLICATIONS  Take your time so you do not get dizzy or light-headed.  If you are in pain, you may need to take or ask for pain medication before doing incentive spirometry. It is harder to take a deep breath if you are having pain. AFTER USE  Rest and  breathe slowly and easily.  It can be helpful to keep track of a log of your progress. Your caregiver can provide you with a simple table to help with this. If you are using the spirometer at home, follow these instructions: Kristy Arias IF:   You are having difficultly using the spirometer.  You have trouble using the spirometer as often as instructed.  Your pain medication is not giving enough relief while using the spirometer.  You develop fever of 100.5 F (38.1 C) or higher. SEEK IMMEDIATE MEDICAL CARE IF:   You cough up bloody sputum that had not been present before.  You develop fever of 102 F (38.9 C) or greater.  You develop worsening pain at or near the incision site. MAKE SURE YOU:   Understand these instructions.  Will watch your condition.  Will get help right away if you are not doing well or get worse. Document Released: 05/24/2006 Document Revised: 04/05/2011 Document Reviewed: 07/25/2006 Exit[employer] Patient Information 2014 [employer], [place].   ________________________________________________________________________  WHAT IS A BLOOD TRANSFUSION? Blood Transfusion Information  A transfusion is the replacement of blood or some of its parts. Blood is made up of multiple cells which provide different functions.  Red blood cells carry oxygen and are used for blood loss replacement.  White blood cells fight against infection.  Platelets control bleeding.  Plasma helps clot blood.  Other blood products are available for specialized needs, such as hemophilia or other clotting disorders. BEFORE THE TRANSFUSION  Who gives blood for transfusions?   Healthy volunteers who are fully evaluated to make sure their blood is safe. This is blood bank blood. Transfusion therapy is the safest it has ever been in the practice of medicine. Before blood is taken from a donor, a complete history is taken to make sure that person has no history of diseases nor engages in  risky social behavior (examples are intravenous drug use or sexual activity with multiple partners). The donor's travel history is screened to minimize risk of transmitting infections, such as malaria. The donated blood is tested for signs of infectious diseases, such as HIV and hepatitis. The blood is then tested to be sure it is compatible with you in order to minimize the chance of a transfusion reaction. If you or a relative donates blood, this is often done in anticipation of surgery and is not appropriate for emergency situations. It takes many days to process the donated blood. RISKS AND COMPLICATIONS Although transfusion therapy is very safe and saves many lives, the main dangers of transfusion include:   Getting an infectious disease.  Developing a transfusion reaction. This is an allergic reaction to something in the blood you were given. Every precaution is taken to prevent this. The decision to have a blood transfusion has been considered carefully by your caregiver before blood is given. Blood is not given unless the benefits outweigh the risks. AFTER THE TRANSFUSION  Right after receiving a blood transfusion, you will usually feel much better and more energetic. This is especially true if your red blood  cells have gotten low (anemic). The transfusion raises the level of the red blood cells which carry oxygen, and this usually causes an energy increase.  The nurse administering the transfusion will monitor you carefully for complications. HOME CARE INSTRUCTIONS  No special instructions are needed after a transfusion. You may find your energy is better. Speak with your caregiver about any limitations on activity for underlying diseases you may have. SEEK MEDICAL CARE IF:   Your condition is not improving after your transfusion.  You develop redness or irritation at the intravenous (IV) site. SEEK IMMEDIATE MEDICAL CARE IF:  Any of the following symptoms occur over the next 12  hours:  Shaking chills.  You have a temperature by mouth above 102 F (38.9 C), not controlled by medicine.  Chest, back, or muscle pain.  People around you feel you are not acting correctly or are confused.  Shortness of breath or difficulty breathing.  Dizziness and fainting.  You get a rash or develop hives.  You have a decrease in urine output.  Your urine turns a dark color or changes to pink, red, or brown. Any of the following symptoms occur over the next 10 days:  You have a temperature by mouth above 102 F (38.9 C), not controlled by medicine.  Shortness of breath.  Weakness after normal activity.  The white part of the eye turns yellow (jaundice).  You have a decrease in the amount of urine or are urinating less often.  Your urine turns a dark color or changes to pink, red, or brown. Document Released: 01/09/2000 Document Revised: 04/05/2011 Document Reviewed: 08/28/2007 [facility] Patient Information 2014 [place], [place].  _______________________________________________________________________

## 2016-08-03 NOTE — Progress Notes (Signed)
05-26-16 (EPIC) Surgical clearance from Dr. Dallas Schimkeopeland on chart.

## 2016-08-04 ENCOUNTER — Encounter (HOSPITAL_COMMUNITY)
Admission: RE | Admit: 2016-08-04 | Discharge: 2016-08-04 | Disposition: A | Discharge status: Home / Self Care | Payer: 59 | Source: Ambulatory Visit | Attending: Orthopedic Surgery | Admitting: Orthopedic Surgery

## 2016-08-04 ENCOUNTER — Encounter (HOSPITAL_COMMUNITY): Payer: Self-pay

## 2016-08-04 DIAGNOSIS — Z01818 Encounter for other preprocedural examination: Secondary | ICD-10-CM | POA: Insufficient documentation

## 2016-08-04 DIAGNOSIS — M1712 Unilateral primary osteoarthritis, left knee: Secondary | ICD-10-CM | POA: Diagnosis not present

## 2016-08-04 DIAGNOSIS — R9431 Abnormal electrocardiogram [ECG] [EKG]: Secondary | ICD-10-CM | POA: Diagnosis not present

## 2016-08-04 LAB — APTT: APTT: 35 s (ref 24–36)

## 2016-08-04 LAB — PROTIME-INR
INR: 0.98
PROTHROMBIN TIME: 13 s (ref 11.4–15.2)

## 2016-08-04 LAB — COMPREHENSIVE METABOLIC PANEL
ALT: 18 U/L (ref 14–54)
AST: 29 U/L (ref 15–41)
Albumin: 3.9 g/dL (ref 3.5–5.0)
Alkaline Phosphatase: 104 U/L (ref 38–126)
Anion gap: 8 (ref 5–15)
BUN: 10 mg/dL (ref 6–20)
CHLORIDE: 105 mmol/L (ref 101–111)
CO2: 28 mmol/L (ref 22–32)
CREATININE: 0.86 mg/dL (ref 0.44–1.00)
Calcium: 9.1 mg/dL (ref 8.9–10.3)
GFR calc non Af Amer: >60 mL/min (ref >60)
Glucose, Bld: 81 mg/dL (ref 65–99)
POTASSIUM: 4.3 mmol/L (ref 3.5–5.1)
SODIUM: 141 mmol/L (ref 135–145)
Total Bilirubin: 0.5 mg/dL (ref 0.3–1.2)
Total Protein: 6.9 g/dL (ref 6.5–8.1)

## 2016-08-04 LAB — CBC
HCT: 39.4 % (ref 36.0–46.0)
Hemoglobin: 13.5 g/dL (ref 12.0–15.0)
MCH: 31.9 pg (ref 26.0–34.0)
MCHC: 34.3 g/dL (ref 30.0–36.0)
MCV: 93.1 fL (ref 78.0–100.0)
PLATELETS: 265 10*3/uL (ref 150–400)
RBC: 4.23 MIL/uL (ref 3.87–5.11)
RDW: 13.5 % (ref 11.5–15.5)
WBC: 5.9 10*3/uL (ref 4.0–10.5)

## 2016-08-04 LAB — GLUCOSE, CAPILLARY: GLUCOSE-CAPILLARY: 94 mg/dL (ref 65–99)

## 2016-08-04 LAB — SURGICAL PCR SCREEN
MRSA, PCR: NEGATIVE
Staphylococcus aureus: NEGATIVE

## 2016-08-04 NOTE — Progress Notes (Addendum)
08-04-16 At PAT appt, pt states that she takes Metformin for memory deficit, based on 'recent evidence based study'. Pt also reported that Cardiac X syndrome causes her to be short of breath on exertion.It does not impacts her cardiac ability.

## 2016-08-05 LAB — HEMOGLOBIN A1C
HEMOGLOBIN A1C: 5.1 % (ref 4.8–5.6)
Mean Plasma Glucose: 100 mg/dL

## 2016-08-07 ENCOUNTER — Ambulatory Visit: Payer: Self-pay | Admitting: Orthopedic Surgery

## 2016-08-07 NOTE — H&P (Signed)
Kristy Arias DOB: 08/20/1950 Married / Language: English / Race: White Female Date of Admission:  08/09/2016 CC:  Left Knee Pain History of Present Illness  The patient is a 65 year old female who comes in for a preoperative History and Physical. The patient is scheduled for a left total knee arthroplasty to be performed by Dr. Frank V. Aluisio, [facility] at  [facility] on 08/09/2016. The patient is a 65 year old female who presented for follow up of their knee. The patient is being followed for their left knee pain and osteoarthritis. They are now months out from cortisone injection. Symptoms reported include: pain, aching and difficulty ambulating. The patient feels that they are doing poorly and report their pain level to be mild to moderate. The patient has reported improvement of their symptoms with: Cortisone injections (helps some). She would like to have the left knee replaced. The right knee which we replaced last year, is doing extremely well. She is not having pain on the right knee. The left knee is very problematic for her. It hurts at all times. It is limiting what she can and cannot do. Recent cortisone injection did not help. She is at a stage where she is ready to go ahead and get the left knee replaced. They have been treated conservatively in the past for the above stated problem and despite conservative measures, they continue to have progressive pain and severe functional limitations and dysfunction. They have failed non-operative management including home exercise, medications, and injections. It is felt that they would benefit from undergoing total joint replacement. Risks and benefits of the procedure have been discussed with the patient and they elect to proceed with surgery. There are no active contraindications to surgery such as ongoing infection or rapidly progressive neurological disease.  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)  Quadriceps  weakness (M62.81)  Primary osteoarthritis of left knee (M17.12)  Aftercare following right knee joint replacement surgery (Z47.1)  Impingement syndrome of left shoulder (M75.42)  Depression  Gastroesophageal Reflux Disease  Hypercholesterolemia  Heart murmur  Hypothyroidism  Migraine Headache  High blood pressure  Impaired Vision  Osteoarthritis  Oophorectomy  bilateral Syndrome X  Glaucoma  Osteopenia  Menopause  Chondromalacia of both patellae (M22.41, M22.42)  Knee stiffness, right (M25.661)  Anxiety Disorder  Degenerative Disc Disease    Allergies No Known Drug Allergies   Family History Hypertension  Paternal Grandmother. Congestive Heart Failure  Maternal Grandmother, Paternal Grandmother. Depression  Father, Mother. Cancer  Mother, Sister. Rheumatoid Arthritis  Father. Severe allergy  Mother. Osteoarthritis  Father, Maternal Grandmother, Mother. Heart Disease  Father, Maternal Grandfather, Paternal Grandfather. Diabetes Mellitus  Father, Maternal Grandfather.  Social History Never consumed alcohol  11/27/2013: Never consumed alcohol Most recent primary occupation  pharmacist Marital status  married Exercise  Exercises weekly; does other Children  2 Current work status  working part time Living situation  live with spouse Tobacco / smoke exposure  11/27/2013: yes outdoors only No history of drug/alcohol rehab  Tobacco use  Never smoker. 11/27/2013 Not under pain contract  Number of flights of stairs before winded  1  Medication History Cymbalta (30MG Capsule DR Part, Oral) Active. Voltaren (75MG Tablet DR, Oral) Active. (1 BID) Levothyroxine Sodium (50MCG Tablet, Oral) Active. AmLODIPine Besylate (10MG Tablet, Oral) Active. (1qd) Losartan Potassium (25MG Tablet, Oral) Active. Xanax (0.5MG Tablet, Oral three times daily) Active. Temazepam (15MG Capsule, Oral) Active. Crestor (5MG Tablet, Oral)  Active. Vivelle-Dot (0.1MG/24HR Patch TW, Transdermal)   Active. Butalbital-APAP-Caffeine (Oral) Specific strength unknown - Active. (prn) Midol (200MG Capsule, Oral) Active. Melatonin (10MG Capsule, Oral) Active. Pamprin (500-25-15MG Tablet, Oral) Active. Tart Cherry Advanced (Oral) Active. Aspirin (81MG Tablet, 1 (one) Oral) Active. MetFORMIN HCl (500MG Tablet, 1/4 tab Oral) Active. Timolol Maleate (0.5% Solution, Ophthalmic two times daily) Active. Travatan Z (0.004% Solution, Ophthalmic) Active.  Past Surgical History  Dilation and Curettage of Uterus - Multiple  Hysterectomy  Date: 2015. complete (non-cancerous) Breast Reconstruction  bilateral; 1978, 1997 Gallbladder Surgery  Date: 2014. laporoscopic   Review of Systems  General Not Present- Chills, Fatigue, Fever, Memory Loss, Night Sweats, Weight Gain and Weight Loss. Skin Not Present- Eczema, Hives, Itching, Lesions and Rash. HEENT Present- Glaucoma, Headache and Visual Loss. Not Present- Dentures, Double Vision, Hearing Loss and Tinnitus. Respiratory Present- Shortness of breath with exertion. Not Present- Allergies, Chronic Cough, Coughing up blood and Shortness of breath at rest. Cardiovascular Present- Hypertension and Murmur. Not Present- Chest Pain, Difficulty Breathing Lying Down, Palpitations, Racing/skipping heartbeats and Swelling. Gastrointestinal Present- Constipation and Indigestion. Not Present- Abdominal Pain, Bloody Stool, Diarrhea, Difficulty Swallowing, Heartburn, Jaundice, Loss of appetitie, Nausea and Vomiting. Female Genitourinary Not Present- Blood in Urine, Discharge, Flank Pain, Incontinence, Painful Urination, Urgency, Urinary frequency, Urinary Retention, Urinating at Night and Weak urinary stream. Musculoskeletal Present- Joint Pain. Not Present- Back Pain, Joint Swelling, Morning Stiffness, Muscle Pain, Muscle Weakness and Spasms. Neurological Present- Decreased Memory and Headaches. Not  Present- Blackout spells, Difficulty with balance, Dizziness, Paralysis, Tremor and Weakness. Psychiatric Present- Anxiety, Memory Loss and Mood changes. Not Present- Insomnia. Endocrine Present- Thyroid Problems. Hematology Present- Easy Bleeding and Easy Bruising.  Vitals  Weight: 122 lb Height: 62in Weight was reported by patient. Height was reported by patient. Body Surface Area: 1.55 m Body Mass Index: 22.31 kg/m  Pulse: 62 (Regular)  BP: 132/74 (Sitting, Left Arm, Standard)   Physical Exam General Mental Status -Alert, cooperative and good historian. General Appearance-pleasant, Not in acute distress. Orientation-Oriented X3. Build & Nutrition-Petite, Well nourished and Well developed.  Head and Neck Head-normocephalic, atraumatic . Neck Global Assessment - supple, no bruit auscultated on the right, no bruit auscultated on the left.  Eye Pupil - Bilateral-Regular and Round. Motion - Bilateral-EOMI.  Chest and Lung Exam Auscultation Breath sounds - clear at anterior chest wall and clear at posterior chest wall. Adventitious sounds - No Adventitious sounds.  Cardiovascular Auscultation Rhythm - Regular rate and rhythm. Heart Sounds - S1 WNL and S2 WNL. Murmurs & Other Heart Sounds - Auscultation of the heart reveals - No Murmurs.  Abdomen Palpation/Percussion Tenderness - Abdomen is non-tender to palpation. Rigidity (guarding) - Abdomen is soft. Auscultation Auscultation of the abdomen reveals - Bowel sounds normal.  Female Genitourinary Note: Not done, not pertinent to present illness   Musculoskeletal Note: She is in no distress. Her right knee shows no swelling. Her range is 0 to 125 on the right with no tenderness or instability. Left knee, no effusion. Varus deformity present. Range of motion on the left knee is 5 to 125. There is tenderness at medial and lateral, no instability.  X-rays AP and lateral of both knees show the  prosthesis on her right in excellent position with no periprosthetic abnormalities. On the left, she has bone on bone arthritis, medial and patellofemoral.   Assessment & Plan Primary osteoarthritis of left knee (M17.12) Aftercare following right knee joint replacement surgery (Z47.1, Z96.651)  Note:Surgical Plans: Left Total Knee Replacement  Disposition: Home, Straight to outpatinet at   [facility]  PCP: Dr. Copland - Patient has been seen preoperatively and felt to be stable for surgery.  IV TXA  Anesthesia Issues: None  Patient was instructed on what medications to stop prior to surgery.  Signed electronically by Alexzandrew L Perkins, III PA-C  

## 2016-08-09 ENCOUNTER — Inpatient Hospital Stay (HOSPITAL_COMMUNITY)
Admission: RE | Admit: 2016-08-09 | Discharge: 2016-08-11 | DRG: 470 | Disposition: A | Discharge status: Home / Self Care | Payer: 59 | Attending: Orthopedic Surgery | Admitting: Orthopedic Surgery

## 2016-08-09 ENCOUNTER — Inpatient Hospital Stay (HOSPITAL_COMMUNITY): Payer: 59 | Admitting: Anesthesiology

## 2016-08-09 ENCOUNTER — Encounter (HOSPITAL_COMMUNITY): Payer: Self-pay

## 2016-08-09 ENCOUNTER — Encounter (HOSPITAL_COMMUNITY)
Admission: RE | Disposition: A | Discharge status: Home / Self Care | Payer: Self-pay | Source: Home / Self Care | Attending: Orthopedic Surgery

## 2016-08-09 DIAGNOSIS — M8580 Other specified disorders of bone density and structure, unspecified site: Secondary | ICD-10-CM | POA: Diagnosis present

## 2016-08-09 DIAGNOSIS — Z8261 Family history of arthritis: Secondary | ICD-10-CM

## 2016-08-09 DIAGNOSIS — K219 Gastro-esophageal reflux disease without esophagitis: Secondary | ICD-10-CM | POA: Diagnosis present

## 2016-08-09 DIAGNOSIS — Z7982 Long term (current) use of aspirin: Secondary | ICD-10-CM | POA: Diagnosis not present

## 2016-08-09 DIAGNOSIS — Z90710 Acquired absence of both cervix and uterus: Secondary | ICD-10-CM | POA: Diagnosis not present

## 2016-08-09 DIAGNOSIS — M7542 Impingement syndrome of left shoulder: Secondary | ICD-10-CM | POA: Diagnosis present

## 2016-08-09 DIAGNOSIS — E039 Hypothyroidism, unspecified: Secondary | ICD-10-CM | POA: Diagnosis present

## 2016-08-09 DIAGNOSIS — E785 Hyperlipidemia, unspecified: Secondary | ICD-10-CM | POA: Diagnosis present

## 2016-08-09 DIAGNOSIS — M25762 Osteophyte, left knee: Secondary | ICD-10-CM | POA: Diagnosis present

## 2016-08-09 DIAGNOSIS — Z833 Family history of diabetes mellitus: Secondary | ICD-10-CM | POA: Diagnosis not present

## 2016-08-09 DIAGNOSIS — H409 Unspecified glaucoma: Secondary | ICD-10-CM | POA: Diagnosis present

## 2016-08-09 DIAGNOSIS — M25562 Pain in left knee: Secondary | ICD-10-CM | POA: Diagnosis present

## 2016-08-09 DIAGNOSIS — F329 Major depressive disorder, single episode, unspecified: Secondary | ICD-10-CM | POA: Diagnosis present

## 2016-08-09 DIAGNOSIS — F419 Anxiety disorder, unspecified: Secondary | ICD-10-CM | POA: Diagnosis present

## 2016-08-09 DIAGNOSIS — Z7984 Long term (current) use of oral hypoglycemic drugs: Secondary | ICD-10-CM | POA: Diagnosis not present

## 2016-08-09 DIAGNOSIS — M1712 Unilateral primary osteoarthritis, left knee: Secondary | ICD-10-CM | POA: Diagnosis present

## 2016-08-09 DIAGNOSIS — I10 Essential (primary) hypertension: Secondary | ICD-10-CM | POA: Diagnosis present

## 2016-08-09 DIAGNOSIS — Z96651 Presence of right artificial knee joint: Secondary | ICD-10-CM | POA: Diagnosis present

## 2016-08-09 DIAGNOSIS — R011 Cardiac murmur, unspecified: Secondary | ICD-10-CM | POA: Diagnosis present

## 2016-08-09 DIAGNOSIS — Z8249 Family history of ischemic heart disease and other diseases of the circulatory system: Secondary | ICD-10-CM

## 2016-08-09 DIAGNOSIS — H547 Unspecified visual loss: Secondary | ICD-10-CM | POA: Diagnosis present

## 2016-08-09 DIAGNOSIS — Z79899 Other long term (current) drug therapy: Secondary | ICD-10-CM | POA: Diagnosis not present

## 2016-08-09 DIAGNOSIS — M1710 Unilateral primary osteoarthritis, unspecified knee: Secondary | ICD-10-CM | POA: Diagnosis present

## 2016-08-09 DIAGNOSIS — G43909 Migraine, unspecified, not intractable, without status migrainosus: Secondary | ICD-10-CM | POA: Diagnosis present

## 2016-08-09 DIAGNOSIS — M179 Osteoarthritis of knee, unspecified: Secondary | ICD-10-CM | POA: Diagnosis present

## 2016-08-09 DIAGNOSIS — E7800 Pure hypercholesterolemia, unspecified: Secondary | ICD-10-CM | POA: Diagnosis present

## 2016-08-09 DIAGNOSIS — Z818 Family history of other mental and behavioral disorders: Secondary | ICD-10-CM | POA: Diagnosis not present

## 2016-08-09 HISTORY — PX: TOTAL KNEE ARTHROPLASTY: SHX125

## 2016-08-09 LAB — GLUCOSE, CAPILLARY
GLUCOSE-CAPILLARY: 215 mg/dL — AB (ref 65–99)
GLUCOSE-CAPILLARY: 174 mg/dL — AB (ref 65–99)
Glucose-Capillary: 79 mg/dL (ref 65–99)

## 2016-08-09 LAB — TYPE AND SCREEN
ABO/RH(D): O POS
ANTIBODY SCREEN: NEGATIVE

## 2016-08-09 SURGERY — ARTHROPLASTY, KNEE, TOTAL
Anesthesia: Spinal | Site: Knee | Laterality: Left

## 2016-08-09 MED ORDER — PHENOL 1.4 % MT LIQD: 1.0000 | OROMUCOSAL | Status: DC | PRN

## 2016-08-09 MED ORDER — EPHEDRINE 5 MG/ML INJ
INTRAVENOUS | Status: AC
  Filled 2016-08-09: qty 10

## 2016-08-09 MED ORDER — ALPRAZOLAM 0.5 MG PO TABS
0.5000 mg | ORAL_TABLET | Freq: Three times a day (TID) | ORAL | Status: DC | PRN
  Administered 2016-08-09 – 2016-08-10 (×3): 0.5 mg via ORAL
  Filled 2016-08-09 (×3): qty 1

## 2016-08-09 MED ORDER — HYDROMORPHONE HCL-NACL 0.5-0.9 MG/ML-% IV SOSY: 0.2500 mg | PREFILLED_SYRINGE | INTRAVENOUS | Status: DC | PRN

## 2016-08-09 MED ORDER — CEFAZOLIN SODIUM-DEXTROSE 2-4 GM/100ML-% IV SOLN
INTRAVENOUS | Status: AC
  Filled 2016-08-09: qty 100

## 2016-08-09 MED ORDER — DIPHENHYDRAMINE HCL 12.5 MG/5ML PO ELIX: 12.5000 mg | ORAL_SOLUTION | ORAL | Status: DC | PRN

## 2016-08-09 MED ORDER — CHLORHEXIDINE GLUCONATE 4 % EX LIQD: 60.0000 mL | Freq: Once | CUTANEOUS | Status: DC

## 2016-08-09 MED ORDER — EPHEDRINE SULFATE 50 MG/ML IJ SOLN
INTRAMUSCULAR | Status: DC | PRN
  Administered 2016-08-09: 10 mg via INTRAVENOUS
  Administered 2016-08-09: 5 mg via INTRAVENOUS

## 2016-08-09 MED ORDER — PROPOFOL 500 MG/50ML IV EMUL
INTRAVENOUS | Status: DC | PRN
  Administered 2016-08-09: 75 ug/kg/min via INTRAVENOUS

## 2016-08-09 MED ORDER — OXYCODONE HCL 5 MG PO TABS
5.0000 mg | ORAL_TABLET | ORAL | Status: DC | PRN
  Administered 2016-08-09 (×2): 10 mg via ORAL
  Administered 2016-08-09 (×2): 5 mg via ORAL
  Administered 2016-08-10 – 2016-08-11 (×12): 10 mg via ORAL
  Filled 2016-08-09 (×4): qty 2
  Filled 2016-08-09: qty 1
  Filled 2016-08-09 (×6): qty 2
  Filled 2016-08-09: qty 1
  Filled 2016-08-09 (×4): qty 2

## 2016-08-09 MED ORDER — PROMETHAZINE HCL 25 MG/ML IJ SOLN: 6.2500 mg | INTRAMUSCULAR | Status: DC | PRN

## 2016-08-09 MED ORDER — ACETAMINOPHEN 325 MG PO TABS: 650.0000 mg | ORAL_TABLET | Freq: Four times a day (QID) | ORAL | Status: DC | PRN

## 2016-08-09 MED ORDER — MIDAZOLAM HCL 5 MG/5ML IJ SOLN
INTRAMUSCULAR | Status: DC | PRN
  Administered 2016-08-09 (×2): 1 mg via INTRAVENOUS

## 2016-08-09 MED ORDER — DEXAMETHASONE SODIUM PHOSPHATE 10 MG/ML IJ SOLN
10.0000 mg | Freq: Once | INTRAMUSCULAR | Status: AC
  Administered 2016-08-09: 10 mg via INTRAVENOUS

## 2016-08-09 MED ORDER — MIDAZOLAM HCL 2 MG/2ML IJ SOLN
INTRAMUSCULAR | Status: AC
  Administered 2016-08-09: 1 mg via INTRAVENOUS
  Filled 2016-08-09: qty 2

## 2016-08-09 MED ORDER — ACETAMINOPHEN 650 MG RE SUPP: 650.0000 mg | Freq: Four times a day (QID) | RECTAL | Status: DC | PRN

## 2016-08-09 MED ORDER — TIMOLOL MALEATE 0.5 % OP SOLN
1.0000 [drp] | Freq: Two times a day (BID) | OPHTHALMIC | Status: DC
  Administered 2016-08-10 – 2016-08-11 (×3): 1 [drp] via OPHTHALMIC
  Filled 2016-08-09: qty 5

## 2016-08-09 MED ORDER — LOSARTAN POTASSIUM 25 MG PO TABS
25.0000 mg | ORAL_TABLET | Freq: Every day | ORAL | Status: DC
  Administered 2016-08-09 – 2016-08-10 (×2): 25 mg via ORAL
  Filled 2016-08-09 (×2): qty 1

## 2016-08-09 MED ORDER — LACTATED RINGERS IV SOLN
INTRAVENOUS | Status: DC
  Administered 2016-08-09: 1000 mL via INTRAVENOUS
  Administered 2016-08-09: 10:00:00 via INTRAVENOUS

## 2016-08-09 MED ORDER — AMLODIPINE BESYLATE 10 MG PO TABS
10.0000 mg | ORAL_TABLET | Freq: Every day | ORAL | Status: DC
  Administered 2016-08-09 – 2016-08-10 (×2): 10 mg via ORAL
  Filled 2016-08-09 (×2): qty 1

## 2016-08-09 MED ORDER — RIVAROXABAN 10 MG PO TABS
10.0000 mg | ORAL_TABLET | Freq: Every day | ORAL | Status: DC
  Administered 2016-08-10 – 2016-08-11 (×2): 10 mg via ORAL
  Filled 2016-08-09 (×3): qty 1

## 2016-08-09 MED ORDER — LEVOTHYROXINE SODIUM 50 MCG PO TABS
50.0000 ug | ORAL_TABLET | Freq: Every day | ORAL | Status: DC
  Administered 2016-08-10 – 2016-08-11 (×2): 50 ug via ORAL
  Filled 2016-08-09 (×2): qty 1

## 2016-08-09 MED ORDER — FENTANYL CITRATE (PF) 100 MCG/2ML IJ SOLN
INTRAMUSCULAR | Status: AC
  Administered 2016-08-09: 50 ug via INTRAVENOUS
  Filled 2016-08-09: qty 2

## 2016-08-09 MED ORDER — BUPIVACAINE LIPOSOME 1.3 % IJ SUSP
INTRAMUSCULAR | Status: DC | PRN
Start: 2016-08-09 — End: 2016-08-09
  Administered 2016-08-09: 20 mL

## 2016-08-09 MED ORDER — SODIUM CHLORIDE 0.9 % IV SOLN
INTRAVENOUS | Status: DC
  Administered 2016-08-09: 14:00:00 via INTRAVENOUS

## 2016-08-09 MED ORDER — BUPIVACAINE IN DEXTROSE 0.75-8.25 % IT SOLN
INTRATHECAL | Status: DC | PRN
  Administered 2016-08-09: 1.6 mL via INTRATHECAL

## 2016-08-09 MED ORDER — CEFAZOLIN SODIUM-DEXTROSE 2-4 GM/100ML-% IV SOLN
2.0000 g | INTRAVENOUS | Status: AC
  Administered 2016-08-09: 2 g via INTRAVENOUS

## 2016-08-09 MED ORDER — MIDAZOLAM HCL 2 MG/2ML IJ SOLN
1.0000 mg | INTRAMUSCULAR | Status: DC | PRN
  Administered 2016-08-09: 1 mg via INTRAVENOUS
  Filled 2016-08-09: qty 2

## 2016-08-09 MED ORDER — TIMOLOL HEMIHYDRATE 0.5 % OP SOLN: 1.0000 [drp] | Freq: Two times a day (BID) | OPHTHALMIC | Status: DC

## 2016-08-09 MED ORDER — ACETAMINOPHEN 10 MG/ML IV SOLN
1000.0000 mg | Freq: Once | INTRAVENOUS | Status: AC
  Administered 2016-08-09: 1000 mg via INTRAVENOUS

## 2016-08-09 MED ORDER — ACETAMINOPHEN 10 MG/ML IV SOLN
INTRAVENOUS | Status: AC
  Filled 2016-08-09: qty 100

## 2016-08-09 MED ORDER — DOCUSATE SODIUM 100 MG PO CAPS
100.0000 mg | ORAL_CAPSULE | Freq: Two times a day (BID) | ORAL | Status: DC
  Administered 2016-08-09 – 2016-08-11 (×4): 100 mg via ORAL
  Filled 2016-08-09 (×4): qty 1

## 2016-08-09 MED ORDER — BISACODYL 10 MG RE SUPP: 10.0000 mg | Freq: Every day | RECTAL | Status: DC | PRN

## 2016-08-09 MED ORDER — INSULIN ASPART 100 UNIT/ML ~~LOC~~ SOLN: 0.0000 [IU] | Freq: Three times a day (TID) | SUBCUTANEOUS | Status: DC

## 2016-08-09 MED ORDER — ROPIVACAINE HCL 5 MG/ML IJ SOLN
INTRAMUSCULAR | Status: DC | PRN
  Administered 2016-08-09: 30 mL via PERINEURAL

## 2016-08-09 MED ORDER — DULOXETINE HCL 60 MG PO CPEP
60.0000 mg | ORAL_CAPSULE | Freq: Every day | ORAL | Status: DC
  Administered 2016-08-10 – 2016-08-11 (×2): 60 mg via ORAL
  Filled 2016-08-09 (×2): qty 1

## 2016-08-09 MED ORDER — PHENYLEPHRINE 40 MCG/ML (10ML) SYRINGE FOR IV PUSH (FOR BLOOD PRESSURE SUPPORT)
PREFILLED_SYRINGE | INTRAVENOUS | Status: AC
  Filled 2016-08-09: qty 10

## 2016-08-09 MED ORDER — POLYETHYLENE GLYCOL 3350 17 G PO PACK: 17.0000 g | PACK | Freq: Every day | ORAL | Status: DC | PRN

## 2016-08-09 MED ORDER — LATANOPROST 0.005 % OP SOLN
1.0000 [drp] | Freq: Every day | OPHTHALMIC | Status: DC
  Administered 2016-08-10: 1 [drp] via OPHTHALMIC
  Filled 2016-08-09: qty 2.5

## 2016-08-09 MED ORDER — MIDAZOLAM HCL 2 MG/2ML IJ SOLN
INTRAMUSCULAR | Status: AC
  Filled 2016-08-09: qty 2

## 2016-08-09 MED ORDER — GABAPENTIN 300 MG PO CAPS
300.0000 mg | ORAL_CAPSULE | Freq: Once | ORAL | Status: AC
  Administered 2016-08-09: 300 mg via ORAL

## 2016-08-09 MED ORDER — TEMAZEPAM 15 MG PO CAPS
15.0000 mg | ORAL_CAPSULE | Freq: Every evening | ORAL | Status: DC | PRN
  Administered 2016-08-10 (×2): 30 mg via ORAL
  Filled 2016-08-09: qty 2
  Filled 2016-08-09: qty 1
  Filled 2016-08-09: qty 2

## 2016-08-09 MED ORDER — METOCLOPRAMIDE HCL 5 MG PO TABS: 5.0000 mg | ORAL_TABLET | Freq: Three times a day (TID) | ORAL | Status: DC | PRN

## 2016-08-09 MED ORDER — SODIUM CHLORIDE 0.9 % IJ SOLN
INTRAMUSCULAR | Status: AC
  Filled 2016-08-09: qty 50

## 2016-08-09 MED ORDER — ONDANSETRON HCL 4 MG PO TABS: 4.0000 mg | ORAL_TABLET | Freq: Four times a day (QID) | ORAL | Status: DC | PRN

## 2016-08-09 MED ORDER — FLEET ENEMA 7-19 GM/118ML RE ENEM: 1.0000 | ENEMA | Freq: Once | RECTAL | Status: DC | PRN

## 2016-08-09 MED ORDER — SODIUM CHLORIDE 0.9 % IJ SOLN
INTRAMUSCULAR | Status: DC | PRN
  Administered 2016-08-09: 60 mL

## 2016-08-09 MED ORDER — METHOCARBAMOL 1000 MG/10ML IJ SOLN
500.0000 mg | Freq: Four times a day (QID) | INTRAVENOUS | Status: DC | PRN
  Filled 2016-08-09: qty 5

## 2016-08-09 MED ORDER — METHOCARBAMOL 500 MG PO TABS
500.0000 mg | ORAL_TABLET | Freq: Four times a day (QID) | ORAL | Status: DC | PRN
  Administered 2016-08-09 – 2016-08-11 (×7): 500 mg via ORAL
  Filled 2016-08-09 (×8): qty 1

## 2016-08-09 MED ORDER — METOCLOPRAMIDE HCL 5 MG/ML IJ SOLN: 5.0000 mg | Freq: Three times a day (TID) | INTRAMUSCULAR | Status: DC | PRN

## 2016-08-09 MED ORDER — FENTANYL CITRATE (PF) 100 MCG/2ML IJ SOLN
50.0000 ug | INTRAMUSCULAR | Status: DC | PRN
  Administered 2016-08-09: 50 ug via INTRAVENOUS

## 2016-08-09 MED ORDER — TRANEXAMIC ACID 1000 MG/10ML IV SOLN
1000.0000 mg | Freq: Once | INTRAVENOUS | Status: AC
  Administered 2016-08-09: 1000 mg via INTRAVENOUS
  Filled 2016-08-09: qty 1100

## 2016-08-09 MED ORDER — SODIUM CHLORIDE 0.9 % IJ SOLN
INTRAMUSCULAR | Status: AC
  Filled 2016-08-09: qty 10

## 2016-08-09 MED ORDER — ONDANSETRON HCL 4 MG/2ML IJ SOLN: 4.0000 mg | Freq: Four times a day (QID) | INTRAMUSCULAR | Status: DC | PRN

## 2016-08-09 MED ORDER — TRANEXAMIC ACID 1000 MG/10ML IV SOLN
1000.0000 mg | INTRAVENOUS | Status: AC
  Administered 2016-08-09: 1000 mg via INTRAVENOUS
  Filled 2016-08-09: qty 1100

## 2016-08-09 MED ORDER — GABAPENTIN 300 MG PO CAPS
ORAL_CAPSULE | ORAL | Status: AC
  Administered 2016-08-09: 300 mg via ORAL
  Filled 2016-08-09: qty 1

## 2016-08-09 MED ORDER — SODIUM CHLORIDE 0.9 % IR SOLN
Status: DC | PRN
  Administered 2016-08-09: 1000 mL

## 2016-08-09 MED ORDER — MORPHINE SULFATE (PF) 4 MG/ML IV SOLN
1.0000 mg | INTRAVENOUS | Status: DC | PRN
  Administered 2016-08-10 (×2): 1 mg via INTRAVENOUS
  Filled 2016-08-09 (×2): qty 1

## 2016-08-09 MED ORDER — PROPOFOL 10 MG/ML IV BOLUS
INTRAVENOUS | Status: AC
  Filled 2016-08-09: qty 40

## 2016-08-09 MED ORDER — MENTHOL 3 MG MT LOZG: 1.0000 | LOZENGE | OROMUCOSAL | Status: DC | PRN

## 2016-08-09 MED ORDER — ROSUVASTATIN CALCIUM 5 MG PO TABS
5.0000 mg | ORAL_TABLET | ORAL | Status: DC
  Administered 2016-08-10: 5 mg via ORAL
  Filled 2016-08-09: qty 1

## 2016-08-09 MED ORDER — ACETAMINOPHEN 500 MG PO TABS
1000.0000 mg | ORAL_TABLET | Freq: Four times a day (QID) | ORAL | Status: AC
  Administered 2016-08-09 – 2016-08-10 (×4): 1000 mg via ORAL
  Filled 2016-08-09 (×4): qty 2

## 2016-08-09 MED ORDER — DEXAMETHASONE SODIUM PHOSPHATE 10 MG/ML IJ SOLN
10.0000 mg | Freq: Once | INTRAMUSCULAR | Status: AC
  Administered 2016-08-10: 10 mg via INTRAVENOUS
  Filled 2016-08-09: qty 1

## 2016-08-09 MED ORDER — CEFAZOLIN SODIUM-DEXTROSE 2-4 GM/100ML-% IV SOLN
2.0000 g | Freq: Four times a day (QID) | INTRAVENOUS | Status: AC
  Administered 2016-08-09 (×2): 2 g via INTRAVENOUS
  Filled 2016-08-09 (×2): qty 100

## 2016-08-09 MED ORDER — BUPIVACAINE LIPOSOME 1.3 % IJ SUSP
20.0000 mL | Freq: Once | INTRAMUSCULAR | Status: DC
  Filled 2016-08-09: qty 20

## 2016-08-09 MED ORDER — FENTANYL CITRATE (PF) 100 MCG/2ML IJ SOLN
INTRAMUSCULAR | Status: AC
  Filled 2016-08-09: qty 2

## 2016-08-09 MED ORDER — ONDANSETRON HCL 4 MG/2ML IJ SOLN
INTRAMUSCULAR | Status: DC | PRN
  Administered 2016-08-09: 4 mg via INTRAVENOUS

## 2016-08-09 SURGICAL SUPPLY — 50 items
BAG DECANTER FOR FLEXI CONT (MISCELLANEOUS) ×2 IMPLANT
BAG SPEC THK2 15X12 ZIP CLS (MISCELLANEOUS) ×1
BAG ZIPLOCK 12X15 (MISCELLANEOUS) ×2 IMPLANT
BANDAGE ACE 6X5 VEL STRL LF (GAUZE/BANDAGES/DRESSINGS) ×2 IMPLANT
BLADE SAG 18X100X1.27 (BLADE) ×2 IMPLANT
BLADE SAW SGTL 11.0X1.19X90.0M (BLADE) ×2 IMPLANT
BOWL SMART MIX CTS (DISPOSABLE) ×2 IMPLANT
CAPT KNEE TOTAL 3 ATTUNE ×1 IMPLANT
CEMENT HV SMART SET (Cement) ×4 IMPLANT
COVER SURGICAL LIGHT HANDLE (MISCELLANEOUS) ×2 IMPLANT
CUFF TOURN SGL QUICK 34 (TOURNIQUET CUFF) ×2
CUFF TRNQT CYL 34X4X40X1 (TOURNIQUET CUFF) ×1 IMPLANT
DECANTER SPIKE VIAL GLASS SM (MISCELLANEOUS) ×2 IMPLANT
DRAPE U-SHAPE 47X51 STRL (DRAPES) ×2 IMPLANT
DRSG ADAPTIC 3X8 NADH LF (GAUZE/BANDAGES/DRESSINGS) ×2 IMPLANT
DRSG PAD ABDOMINAL 8X10 ST (GAUZE/BANDAGES/DRESSINGS) ×2 IMPLANT
DURAPREP 26ML APPLICATOR (WOUND CARE) ×2 IMPLANT
ELECT REM PT RETURN 15FT ADLT (MISCELLANEOUS) ×2 IMPLANT
EVACUATOR 1/8 PVC DRAIN (DRAIN) ×2 IMPLANT
GAUZE SPONGE 4X4 12PLY STRL (GAUZE/BANDAGES/DRESSINGS) ×2 IMPLANT
GLOVE BIO SURGEON STRL SZ7.5 (GLOVE) IMPLANT
GLOVE BIO SURGEON STRL SZ8 (GLOVE) ×2 IMPLANT
GLOVE BIOGEL PI IND STRL 6.5 (GLOVE) IMPLANT
GLOVE BIOGEL PI IND STRL 8 (GLOVE) ×1 IMPLANT
GLOVE BIOGEL PI INDICATOR 6.5 (GLOVE)
GLOVE BIOGEL PI INDICATOR 8 (GLOVE) ×1
GLOVE SURG SS PI 6.5 STRL IVOR (GLOVE) IMPLANT
GOWN STRL REUS W/TWL LRG LVL3 (GOWN DISPOSABLE) ×2 IMPLANT
GOWN STRL REUS W/TWL XL LVL3 (GOWN DISPOSABLE) IMPLANT
HANDPIECE INTERPULSE COAX TIP (DISPOSABLE) ×2
IMMOBILIZER KNEE 20 (SOFTGOODS) ×2
IMMOBILIZER KNEE 20 THIGH 36 (SOFTGOODS) ×1 IMPLANT
MANIFOLD NEPTUNE II (INSTRUMENTS) ×2 IMPLANT
NS IRRIG 1000ML POUR BTL (IV SOLUTION) ×2 IMPLANT
PACK TOTAL KNEE CUSTOM (KITS) ×2 IMPLANT
PAD ABD 8X10 STRL (GAUZE/BANDAGES/DRESSINGS) ×1 IMPLANT
PADDING CAST COTTON 6X4 STRL (CAST SUPPLIES) ×5 IMPLANT
POSITIONER SURGICAL ARM (MISCELLANEOUS) ×2 IMPLANT
SET HNDPC FAN SPRY TIP SCT (DISPOSABLE) ×1 IMPLANT
STRIP CLOSURE SKIN 1/2X4 (GAUZE/BANDAGES/DRESSINGS) ×3 IMPLANT
SUT MNCRL AB 4-0 PS2 18 (SUTURE) ×2 IMPLANT
SUT STRATAFIX 0 PDS 27 VIOLET (SUTURE) ×2
SUT VIC AB 2-0 CT1 27 (SUTURE) ×6
SUT VIC AB 2-0 CT1 TAPERPNT 27 (SUTURE) ×3 IMPLANT
SUTURE STRATFX 0 PDS 27 VIOLET (SUTURE) ×1 IMPLANT
SYR 30ML LL (SYRINGE) ×4 IMPLANT
TRAY FOLEY BAG SILVER LF 14FR (CATHETERS) ×1 IMPLANT
WATER STERILE IRR 1000ML POUR (IV SOLUTION) ×4 IMPLANT
WRAP KNEE MAXI GEL POST OP (GAUZE/BANDAGES/DRESSINGS) ×2 IMPLANT
YANKAUER SUCT BULB TIP 10FT TU (MISCELLANEOUS) ×2 IMPLANT

## 2016-08-09 NOTE — Anesthesia Procedure Notes (Signed)
Spinal  Patient location during procedure: OR Start time: 08/09/2016 9:08 AM End time: 08/09/2016 9:11 AM Staffing Anesthesiologist: ROSE, Iona Beard Resident/CRNA: Glory Buff Performed: resident/CRNA  Preanesthetic Checklist Completed: patient identified, site marked, surgical consent, pre-op evaluation, timeout performed, IV checked, risks and benefits discussed and monitors and equipment checked Spinal Block Patient position: sitting Prep: DuraPrep Patient monitoring: heart rate, continuous pulse ox and blood pressure Approach: midline Location: L3-4 Injection technique: single-shot Needle Needle type: Pencan  Needle gauge: 24 G Needle length: 10 cm Needle insertion depth: 4 cm Assessment Sensory level: T6 Additional Notes Kit expiration date checked and verified.  -heme, -paraesthesia, +CSF pre and post injection.  Patient tolerated well.

## 2016-08-09 NOTE — Op Note (Signed)
OPERATIVE REPORT-TOTAL KNEE ARTHROPLASTY   Pre-operative diagnosis- Osteoarthritis  Left knee(s)  Post-operative diagnosis- Osteoarthritis Left knee(s)  Procedure-  Left  Total Knee Arthroplasty  Surgeon- Gus RankinFrank V. Jahan Friedlander, MD  Assistant- Dimitri PedAmber Constable, PA-C   Anesthesia-  Adductor canal block and spinal  EBL-* No blood loss amount entered *   Drains Hemovac  Tourniquet time-  Total Tourniquet Time Documented: Thigh (Left) - 33 minutes Total: Thigh (Left) - 33 minutes     Complications- None  Condition-PACU - hemodynamically stable.   Brief Clinical Note  Kristy Arias is a 66 y.o. year old female with end stage OA of her left knee with progressively worsening pain and dysfunction. She has constant pain, with activity and at rest and significant functional deficits with difficulties even with ADLs. She has had extensive non-op management including analgesics, injections of cortisone and viscosupplements, and home exercise program, but remains in significant pain with significant dysfunction. Radiographs show bone on bone arthritis medial and patellofemoral. She presents now for left Total Knee Arthroplasty.    Procedure in detail---   The patient is brought into the operating room and positioned supine on the operating table. After successful administration of  Adductor canal block and spinal,   a tourniquet is placed high on the  Left thigh(s) and the lower extremity is prepped and draped in the usual sterile fashion. Time out is performed by the operating team and then the  Left lower extremity is wrapped in Esmarch, knee flexed and the tourniquet inflated to 300 mmHg.       A midline incision is made with a ten blade through the subcutaneous tissue to the level of the extensor mechanism. A fresh blade is used to make a medial parapatellar arthrotomy. Soft tissue over the proximal medial tibia is subperiosteally elevated to the joint line with a knife and into the  semimembranosus bursa with a Cobb elevator. Soft tissue over the proximal lateral tibia is elevated with attention being paid to avoiding the patellar tendon on the tibial tubercle. The patella is everted, knee flexed 90 degrees and the ACL and PCL are removed. Findings are bone on bone medial and patellofemoral with large osteophytes.        The drill is used to create a starting hole in the distal femur and the canal is thoroughly irrigated with sterile saline to remove the fatty contents. The 5 degree Left  valgus alignment guide is placed into the femoral canal and the distal femoral cutting block is pinned to remove 9 mm off the distal femur. Resection is made with an oscillating saw.      The tibia is subluxed forward and the menisci are removed. The extramedullary alignment guide is placed referencing proximally at the medial aspect of the tibial tubercle and distally along the second metatarsal axis and tibial crest. The block is pinned to remove 2mm off the more deficient medial  side. Resection is made with an oscillating saw. Size 4is the most appropriate size for the tibia and the proximal tibia is prepared with the modular drill and keel punch for that size.      The femoral sizing guide is placed and size 4 is most appropriate. Rotation is marked off the epicondylar axis and confirmed by creating a rectangular flexion gap at 90 degrees. The size 4 cutting block is pinned in this rotation and the anterior, posterior and chamfer cuts are made with the oscillating saw. The intercondylar block is then placed and that  cut is made.      Trial size 4 tibial component, trial size 4 posterior stabilized femur and a 12  mm posterior stabilized rotating platform insert trial is placed. Full extension is achieved with excellent varus/valgus and anterior/posterior balance throughout full range of motion. The patella is everted and thickness measured to be 21  mm. Free hand resection is taken to 12 mm, a 35  template is placed, lug holes are drilled, trial patella is placed, and it tracks normally. Osteophytes are removed off the posterior femur with the trial in place. All trials are removed and the cut bone surfaces prepared with pulsatile lavage. Cement is mixed and once ready for implantation, the size 4 tibial implant, size  4 posterior stabilized femoral component, and the size 35 patella are cemented in place and the patella is held with the clamp. The trial insert is placed and the knee held in full extension. The Exparel (20 ml mixed with 60 ml saline) is injected into the extensor mechanism, posterior capsule, medial and lateral gutters and subcutaneous tissues.  All extruded cement is removed and once the cement is hard the permanent 12 mm posterior stabilized rotating platform insert is placed into the tibial tray.      The wound is copiously irrigated with saline solution and the extensor mechanism closed over a hemovac drain with #1 V-loc suture. The tourniquet is released for a total tourniquet time of 32  minutes. Flexion against gravity is 140 degrees and the patella tracks normally. Subcutaneous tissue is closed with 2.0 vicryl and subcuticular with running 4.0 Monocryl. The incision is cleaned and dried and steri-strips and a bulky sterile dressing are applied. The limb is placed into a knee immobilizer and the patient is awakened and transported to recovery in stable condition.      Please note that a surgical assistant was a medical necessity for this procedure in order to perform it in a safe and expeditious manner. Surgical assistant was necessary to retract the ligaments and vital neurovascular structures to prevent injury to them and also necessary for proper positioning of the limb to allow for anatomic placement of the prosthesis.   Gus Rankin Giulian Goldring, MD    08/09/2016, 10:09 AM

## 2016-08-09 NOTE — Transfer of Care (Signed)
Immediate Anesthesia Transfer of Care Note  Patient: Kristy Arias  Procedure(s) Performed: Procedure(s): LEFT TOTAL KNEE ARTHROPLASTY (Left)  Patient Location: PACU  Anesthesia Type:Spinal and MAC combined with regional for post-op pain  Level of Consciousness:  sedated, patient cooperative and responds to stimulation  Airway & Oxygen Therapy:Patient Spontanous Breathing and Patient connected to face mask oxgen  Post-op Assessment:  Report given to PACU RN and Post -op Vital signs reviewed and stable  Post vital signs:  Reviewed and stable  Last Vitals:  Vitals:   08/09/16 0849 08/09/16 0852  BP:  130/82  Pulse: 60 (!) 58  Resp: 17 13  Temp:      Complications: No apparent anesthesia complications

## 2016-08-09 NOTE — H&P (View-Only) (Signed)
Kristy Arias DOB: 01/13/1951 Married / Language: English / Race: White Female Date of Admission:  08/09/2016 CC:  Left Knee Pain History of Present Illness  The patient is a 66 year old female who comes in for a preoperative History and Physical. The patient is scheduled for a left total knee arthroplasty to be performed by Dr. Gus Rankin. Aluisio, MD at [facility] on 08/09/2016. The patient is a 66 year old female who presented for follow up of their knee. The patient is being followed for their left knee pain and osteoarthritis. They are now months out from cortisone injection. Symptoms reported include: pain, aching and difficulty ambulating. The patient feels that they are doing poorly and report their pain level to be mild to moderate. The patient has reported improvement of their symptoms with: Cortisone injections (helps some). She would like to have the left knee replaced. The right knee which we replaced last year, is doing extremely well. She is not having pain on the right knee. The left knee is very problematic for her. It hurts at all times. It is limiting what she can and cannot do. Recent cortisone injection did not help. She is at a stage where she is ready to go ahead and get the left knee replaced. They have been treated conservatively in the past for the above stated problem and despite conservative measures, they continue to have progressive pain and severe functional limitations and dysfunction. They have failed non-operative management including home exercise, medications, and injections. It is felt that they would benefit from undergoing total joint replacement. Risks and benefits of the procedure have been discussed with the patient and they elect to proceed with surgery. There are no active contraindications to surgery such as ongoing infection or rapidly progressive neurological disease.  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)  Quadriceps  weakness (M62.81)  Primary osteoarthritis of left knee (M17.12)  Aftercare following right knee joint replacement surgery (Z47.1)  Impingement syndrome of left shoulder (M75.42)  Depression  Gastroesophageal Reflux Disease  Hypercholesterolemia  Heart murmur  Hypothyroidism  Migraine Headache  High blood pressure  Impaired Vision  Osteoarthritis  Oophorectomy  bilateral Syndrome X  Glaucoma  Osteopenia  Menopause  Chondromalacia of both patellae (M22.41, M22.42)  Knee stiffness, right (M25.661)  Anxiety Disorder  Degenerative Disc Disease    Allergies No Known Drug Allergies   Family History Hypertension  Paternal Grandmother. Congestive Heart Failure  Maternal Grandmother, Paternal Grandmother. Depression  Father, Mother. Cancer  Mother, Sister. Rheumatoid Arthritis  Father. Severe allergy  Mother. Osteoarthritis  Father, Maternal Grandmother, Mother. Heart Disease  Father, Maternal Grandfather, Paternal Grandfather. Diabetes Mellitus  Father, Maternal Grandfather.  Social History Never consumed alcohol  11/27/2013: Never consumed alcohol Most recent primary occupation  pharmacist Marital status  married Exercise  Exercises weekly; does other Children  2 Current work status  working part time Living situation  live with spouse Tobacco / smoke exposure  11/27/2013: yes outdoors only No history of drug/alcohol rehab  Tobacco use  Never smoker. 11/27/2013 Not under pain contract  Number of flights of stairs before winded  1  Medication History Cymbalta (30MG  Capsule DR Part, Oral) Active. Voltaren (75MG  Tablet DR, Oral) Active. (1 BID) Levothyroxine Sodium ( Tablet, Oral) Active. AmLODIPine Besylate (10MG  Tablet, Oral) Active. (1qd) Losartan Potassium (25MG  Tablet, Oral) Active. Xanax (0.5MG  Tablet, Oral three times daily) Active. Temazepam (15MG  Capsule, Oral) Active. Crestor (5MG  Tablet, Oral)  Active. Vivelle-Dot (0.1MG /24HR Patch TW, Transdermal)  Active. Butalbital-APAP-Caffeine (Oral) Specific strength unknown - Active. (prn) Midol (200MG  Capsule, Oral) Active. Melatonin (10MG  Capsule, Oral) Active. Pamprin (500-25-15MG  Tablet, Oral) Active. Tart Cherry Advanced (Oral) Active. Aspirin (81MG  Tablet, 1 (one) Oral) Active. MetFORMIN HCl (500MG  Tablet, 1/4 tab Oral) Active. Timolol Maleate (0.5% Solution, Ophthalmic two times daily) Active. Travatan Z (0.004% Solution, Ophthalmic) Active.  Past Surgical History  Dilation and Curettage of Uterus - Multiple  Hysterectomy  Date: 2015. complete (non-cancerous) Breast Reconstruction  bilateral; 1978, 1997 Gallbladder Surgery  Date: 2014. laporoscopic   Review of Systems  General Not Present- Chills, Fatigue, Fever, Memory Loss, Night Sweats, Weight Gain and Weight Loss. Skin Not Present- Eczema, Hives, Itching, Lesions and Rash. HEENT Present- Glaucoma, Headache and Visual Loss. Not Present- Dentures, Double Vision, Hearing Loss and Tinnitus. Respiratory Present- Shortness of breath with exertion. Not Present- Allergies, Chronic Cough, Coughing up blood and Shortness of breath at rest. Cardiovascular Present- Hypertension and Murmur. Not Present- Chest Pain, Difficulty Breathing Lying Down, Palpitations, Racing/skipping heartbeats and Swelling. Gastrointestinal Present- Constipation and Indigestion. Not Present- Abdominal Pain, Bloody Stool, Diarrhea, Difficulty Swallowing, Heartburn, Jaundice, Loss of appetitie, Nausea and Vomiting. Female Genitourinary Not Present- Blood in Urine, Discharge, Flank Pain, Incontinence, Painful Urination, Urgency, Urinary frequency, Urinary Retention, Urinating at Night and Weak urinary stream. Musculoskeletal Present- Joint Pain. Not Present- Back Pain, Joint Swelling, Morning Stiffness, Muscle Pain, Muscle Weakness and Spasms. Neurological Present- Decreased Memory and Headaches. Not  Present- Blackout spells, Difficulty with balance, Dizziness, Paralysis, Tremor and Weakness. Psychiatric Present- Anxiety, Memory Loss and Mood changes. Not Present- Insomnia. Endocrine Present- Thyroid Problems. Hematology Present- Easy Bleeding and Easy Bruising.  Vitals  Weight: 122 lb Height: 62in Weight was reported by patient. Height was reported by patient. Body Surface Area: 1.55 m Body Mass Index: 22.31 kg/m  Pulse: 62 (Regular)  BP: 132/74 (Sitting, Left Arm, Standard)   Physical Exam General Mental Status -Alert, cooperative and good historian. General Appearance-pleasant, Not in acute distress. Orientation-Oriented X3. Build & Nutrition-Petite, Well nourished and Well developed.  Head and Neck Head-normocephalic, atraumatic . Neck Global Assessment - supple, no bruit auscultated on the right, no bruit auscultated on the left.  Eye Pupil - Bilateral-Regular and Round. Motion - Bilateral-EOMI.  Chest and Lung Exam Auscultation Breath sounds - clear at anterior chest wall and clear at posterior chest wall. Adventitious sounds - No Adventitious sounds.  Cardiovascular Auscultation Rhythm - Regular rate and rhythm. Heart Sounds - S1 WNL and S2 WNL. Murmurs & Other Heart Sounds - Auscultation of the heart reveals - No Murmurs.  Abdomen Palpation/Percussion Tenderness - Abdomen is non-tender to palpation. Rigidity (guarding) - Abdomen is soft. Auscultation Auscultation of the abdomen reveals - Bowel sounds normal.  Female Genitourinary Note: Not done, not pertinent to present illness   Musculoskeletal Note: She is in no distress. Her right knee shows no swelling. Her range is 0 to 125 on the right with no tenderness or instability. Left knee, no effusion. Varus deformity present. Range of motion on the left knee is 5 to 125. There is tenderness at medial and lateral, no instability.  X-rays AP and lateral of both knees show the  prosthesis on her right in excellent position with no periprosthetic abnormalities. On the left, she has bone on bone arthritis, medial and patellofemoral.   Assessment & Plan Primary osteoarthritis of left knee (M17.12) Aftercare following right knee joint replacement surgery (Z47.1, Z96.651)  Note:Surgical Plans: Left Total Knee Replacement  Disposition: Home, Straight to outpatinet at  [facility]  PCP: Dr. Patsy Lageropland - Patient has been seen preoperatively and felt to be stable for surgery.  IV TXA  Anesthesia Issues: None  Patient was instructed on what medications to stop prior to surgery.  Signed electronically by Lauraine RinneAlexzandrew L Perkins, III PA-C

## 2016-08-09 NOTE — Anesthesia Procedure Notes (Signed)
Anesthesia Regional Block: Adductor canal block   Pre-Anesthetic Checklist: ,, timeout performed, Correct Patient, Correct Site, Correct Laterality, Correct Procedure, Correct Position, site marked, Risks and benefits discussed,  Surgical consent,  Pre-op evaluation,  At surgeon's request and post-op pain management  Laterality: Left  Prep: chloraprep       Needles:  Injection technique: Single-shot  Needle Type: Echogenic Needle     Needle Length: 9cm      Additional Needles:   Procedures: ultrasound guided,,,,,,,,  Narrative:  Start time: 08/09/2016 8:41 AM End time: 08/09/2016 8:50 AM Injection made incrementally with aspirations every 5 mL.  Performed by: Personally  Anesthesiologist: Dylana Shaw  Additional Notes: Patient tolerated the procedure well without complications

## 2016-08-09 NOTE — Anesthesia Procedure Notes (Signed)
Date/Time: 08/09/2016 9:03 AM Performed by: Thornell MuleSTUBBLEFIELD, Latiffany Harwick G Oxygen Delivery Method: Nasal cannula

## 2016-08-09 NOTE — Progress Notes (Signed)
Physical Therapy Evaluation Patient Details Name: Kristy Arias MRN: [id number] DOB: [birth year] Today's Date: 08/09/2016   History of Present Illness  Pt is a 66 yo female s/p L TKA  Clinical Impression  Pt is s/p L TKA surgery resulting in the deficits listed below. Pt will benefit from skilled PT to increase their independence and safety with mobility (while adhering to their precautions) to allow for discharge. Pt tolerated treatment well. Pt originally felt dizzy following transfer initiation but it subsided and she was able to ambulate. Pt plans on being discharged to home with husband and will pursue outpatient PT.     Follow Up Recommendations Outpatient PT    Equipment Recommendations  None recommended by PT    Recommendations for Other Services       Precautions / Restrictions Precautions Precautions: Fall;Knee Required Braces or Orthoses: Knee Immobilizer - Left Restrictions LLE Weight Bearing: Weight bearing as tolerated      Mobility  Bed Mobility Overal bed mobility: Needs Assistance Bed Mobility: Supine to Sit     Supine to sit: Min guard     General bed mobility comments: Decr time  Transfers Overall transfer level: Needs assistance Equipment used: Rolling walker (2 wheeled) Transfers: Sit to/from Stand Sit to Stand: Min assist         General transfer comment: Pt reported initial dizziness that settled in 2 minutes after transitioning from sit/stand, min assist provided for stabilization and safety  Ambulation/Gait Ambulation/Gait assistance: Min guard Ambulation Distance (Feet): 50 Feet Assistive device: Rolling walker (2 wheeled) Gait Pattern/deviations: Step-through pattern;Decreased stride length;Decreased stance time - left;Antalgic Gait velocity: decr   General Gait Details: verbal cues provided for sequence, pt tolerated ambulation well  Stairs            Wheelchair Mobility    Modified Rankin (Stroke Patients Only)        Balance                                             Pertinent Vitals/Pain Pain Assessment: 0-10 (Pt currently reports pain 0/10) Pain Score: 0-No pain    Home Living Family/patient expects to be discharged to:: Private residence Living Arrangements: Spouse/significant other Available Help at Discharge: Family Type of Home: House Home Access: Stairs to enter Entrance Stairs-Rails: Right Entrance Stairs-Number of Steps: 2 Home Layout: Two level (Pt reports being able to live on main level if need be) Home Equipment: Walker - 2 wheels      Prior Function Level of Independence: Independent               Hand Dominance        Extremity/Trunk Assessment   Upper Extremity Assessment Upper Extremity Assessment: Overall WFL for tasks assessed    Lower Extremity Assessment Lower Extremity Assessment: LLE deficits/detail LLE Deficits / Details: able to perform SLR, maintained knee mobilizer during treatment       Communication   Communication: No difficulties  Cognition Arousal/Alertness: Awake/alert Behavior During Therapy: WFL for tasks assessed/performed Overall Cognitive Status: Within Functional Limits for tasks assessed                                        General Comments      Exercises  Assessment/Plan    PT Assessment Patient needs continued PT services  PT Problem List Decreased strength;Decreased mobility;Decreased safety awareness;Decreased range of motion;Decreased activity tolerance;Decreased balance;Decreased knowledge of use of DME;Decreased knowledge of precautions;Pain       PT Treatment Interventions DME instruction;Therapeutic activities;Gait training;Therapeutic exercise;Patient/family education;Balance training;Stair training;Functional mobility training    PT Goals (Current goals can be found in the Care Plan section)  Acute Rehab PT Goals Patient Stated Goal: Pt would like to return home PT  Goal Formulation: With patient Time For Goal Achievement: 08/23/16 Potential to Achieve Goals: Good    Frequency 7X/week   Barriers to discharge        Co-evaluation               AM-PAC PT "6 Clicks" Daily Activity  Outcome Measure Difficulty turning over in bed (including adjusting bedclothes, sheets and blankets)?: A Little Difficulty moving from lying on back to sitting on the side of the bed? : A Little Difficulty sitting down on and standing up from a chair with arms (e.g., wheelchair, bedside commode, etc,.)?: Total Help needed moving to and from a bed to chair (including a wheelchair)?: Total Help needed walking in hospital room?: A Little Help needed climbing 3-5 steps with a railing? : A Little 6 Click Score: 14    End of Session Equipment Utilized During Treatment: Gait belt;Left knee immobilizer Activity Tolerance: Patient tolerated treatment well Patient left: in chair;with family/visitor present;with call bell/phone within reach Nurse Communication: Mobility status (Nurse observed pt ambulating) PT Visit Diagnosis: Difficulty in walking, not elsewhere classified (R26.2)    Time: [id number] PT Time Calculation (min) (ACUTE ONLY): 23 min   Charges:   PT Evaluation $PT Eval Low Complexity: 1 Procedure     PT G CodesMarlene Bast:        Joe Deoni Cosey, SPT  Kathrene BongoJoseph Jalaine Riggenbach 08/09/2016, 5:36 PM

## 2016-08-09 NOTE — Anesthesia Preprocedure Evaluation (Addendum)
Anesthesia Evaluation  Patient identified by MRN, date of birth, ID band Patient awake    Reviewed: Allergy & Precautions, NPO status , Patient's Chart, lab work & pertinent test results  Airway Mallampati: II  TM Distance: >3 FB Neck ROM: Full    Dental no notable dental hx.    Pulmonary neg pulmonary ROS,    Pulmonary exam normal breath sounds clear to auscultation       Cardiovascular hypertension, Normal cardiovascular exam Rhythm:Regular Rate:Normal     Neuro/Psych negative neurological ROS  negative psych ROS   GI/Hepatic negative GI ROS, Neg liver ROS,   Endo/Other  Hypothyroidism   Renal/GU negative Renal ROS  negative genitourinary   Musculoskeletal negative musculoskeletal ROS (+)   Abdominal   Peds negative pediatric ROS (+)  Hematology negative hematology ROS (+)   Anesthesia Other Findings   Reproductive/Obstetrics negative OB ROS                             Anesthesia Physical Anesthesia Plan  ASA: II  Anesthesia Plan: Spinal   Post-op Pain Management:  Regional for Post-op pain   Induction: Intravenous  PONV Risk Score and Plan: 1 and Ondansetron and Dexamethasone  Airway Management Planned: Simple Face Mask  Additional Equipment:   Intra-op Plan:   Post-operative Plan:   Informed Consent: I have reviewed the patients History and Physical, chart, labs and discussed the procedure including the risks, benefits and alternatives for the proposed anesthesia with the patient or authorized representative who has indicated his/her understanding and acceptance.   Dental advisory given  Plan Discussed with: CRNA and Surgeon  Anesthesia Plan Comments:         Anesthesia Quick Evaluation

## 2016-08-09 NOTE — Anesthesia Postprocedure Evaluation (Signed)
Anesthesia Post Note  Patient: Kristy Arias  Procedure(s) Performed: Procedure(s) (LRB): LEFT TOTAL KNEE ARTHROPLASTY (Left)     Patient location during evaluation: PACU Anesthesia Type: Spinal Level of consciousness: oriented and awake and alert Pain management: pain level controlled Vital Signs Assessment: post-procedure vital signs reviewed and stable Respiratory status: spontaneous breathing, respiratory function stable and patient connected to nasal cannula oxygen Cardiovascular status: blood pressure returned to baseline and stable Postop Assessment: no headache and no backache Anesthetic complications: no    Last Vitals:  Vitals:   08/09/16 1115 08/09/16 1130  BP: (!) 147/74   Pulse: (!) 52 62  Resp: 15 13  Temp:      Last Pain:  Vitals:   08/09/16 1130  TempSrc:   PainSc: 0-No pain    LLE Motor Response: Purposeful movement (08/09/16 1130) LLE Sensation: No sensation (absent) (08/09/16 1130) RLE Motor Response: Purposeful movement (08/09/16 1130) RLE Sensation: No sensation (absent) (08/09/16 1130) L Sensory Level: L3-Anterior knee, lower leg (08/09/16 1130) R Sensory Level: L3-Anterior knee, lower leg (08/09/16 1130)  Rian Busche S

## 2016-08-09 NOTE — Anesthesia Procedure Notes (Signed)
Anesthesia Procedure Image    

## 2016-08-09 NOTE — Interval H&P Note (Signed)
History and Physical Interval Note:  08/09/2016 7:21 AM  Kristy Arias  has presented today for surgery, with the diagnosis of Osteoarthritis Left Knee  The various methods of treatment have been discussed with the patient and family. After consideration of risks, benefits and other options for treatment, the patient has consented to  Procedure(s): LEFT TOTAL KNEE ARTHROPLASTY (Left) as a surgical intervention .  The patient's history has been reviewed, patient examined, no change in status, stable for surgery.  I have reviewed the patient's chart and labs.  Questions were answered to the patient's satisfaction.     Loanne DrillingALUISIO,Layson Bertsch V

## 2016-08-09 NOTE — Progress Notes (Signed)
Assisted Dr. Rose with left, ultrasound guided, adductor canal block. Side rails up, monitors on throughout procedure. See vital signs in flow sheet. Tolerated Procedure well.  

## 2016-08-10 LAB — BASIC METABOLIC PANEL
Anion gap: 11 (ref 5–15)
BUN: 7 mg/dL (ref 6–20)
CHLORIDE: 100 mmol/L — AB (ref 101–111)
CO2: 26 mmol/L (ref 22–32)
Calcium: 8.6 mg/dL — ABNORMAL LOW (ref 8.9–10.3)
Creatinine, Ser: 0.76 mg/dL (ref 0.44–1.00)
GFR calc non Af Amer: >60 mL/min (ref >60)
Glucose, Bld: 136 mg/dL — ABNORMAL HIGH (ref 65–99)
POTASSIUM: 3.3 mmol/L — AB (ref 3.5–5.1)
SODIUM: 137 mmol/L (ref 135–145)

## 2016-08-10 LAB — CBC
HCT: 33.9 % — ABNORMAL LOW (ref 36.0–46.0)
HEMOGLOBIN: 11.7 g/dL — AB (ref 12.0–15.0)
MCH: 32 pg (ref 26.0–34.0)
MCHC: 34.5 g/dL (ref 30.0–36.0)
MCV: 92.6 fL (ref 78.0–100.0)
Platelets: 271 10*3/uL (ref 150–400)
RBC: 3.66 MIL/uL — AB (ref 3.87–5.11)
RDW: 13.4 % (ref 11.5–15.5)
WBC: 17 10*3/uL — ABNORMAL HIGH (ref 4.0–10.5)

## 2016-08-10 LAB — GLUCOSE, CAPILLARY
GLUCOSE-CAPILLARY: 133 mg/dL — AB (ref 65–99)
Glucose-Capillary: 159 mg/dL — ABNORMAL HIGH (ref 65–99)
Glucose-Capillary: 147 mg/dL — ABNORMAL HIGH (ref 65–99)

## 2016-08-10 MED ORDER — GABAPENTIN 300 MG PO CAPS
300.0000 mg | ORAL_CAPSULE | Freq: Three times a day (TID) | ORAL | Status: DC
  Administered 2016-08-10 – 2016-08-11 (×4): 300 mg via ORAL
  Filled 2016-08-10 (×4): qty 1

## 2016-08-10 MED ORDER — TIZANIDINE HCL 4 MG PO TABS: 4.0000 mg | ORAL_TABLET | Freq: Three times a day (TID) | ORAL | 1 refills | Status: DC | PRN

## 2016-08-10 MED ORDER — RIVAROXABAN 10 MG PO TABS: 10.0000 mg | ORAL_TABLET | Freq: Every day | ORAL | 0 refills | Status: DC

## 2016-08-10 MED ORDER — POTASSIUM CHLORIDE CRYS ER 20 MEQ PO TBCR
40.0000 meq | EXTENDED_RELEASE_TABLET | ORAL | Status: AC
  Administered 2016-08-10 (×2): 40 meq via ORAL
  Filled 2016-08-10 (×2): qty 2

## 2016-08-10 MED ORDER — GABAPENTIN 600 MG PO TABS
300.0000 mg | ORAL_TABLET | Freq: Three times a day (TID) | ORAL | Status: DC
  Filled 2016-08-10 (×2): qty 0.5

## 2016-08-10 MED ORDER — OXYCODONE HCL 5 MG PO TABS: 5.0000 mg | ORAL_TABLET | ORAL | 0 refills | Status: DC | PRN

## 2016-08-10 NOTE — Progress Notes (Signed)
Orthopedic Tech Progress Note Patient Details:  Kristy CivatteDiane C Arias 10/25/1950 [id number]  Patient ID: Kristy Arias, female   DOB: 10/03/1950, 66 y.o.   MRN: [id number]   [name] 08/10/2016, 10:20 PMPatient CPM log, Pt in CPM from 1615 till 1840, and 2010 till 2215. For a total of four hours 30 minutes.

## 2016-08-10 NOTE — Discharge Instructions (Signed)
° °Dr. Darya Bigler °Total Joint Specialist °[facility] °[address] °, [place] [address] °[phone] ° °TOTAL KNEE REPLACEMENT POSTOPERATIVE DIRECTIONS ° °Knee Rehabilitation, Guidelines Following Surgery  °Results after knee surgery are often greatly improved when you follow the exercise, range of motion and muscle strengthening exercises prescribed by your doctor. Safety measures are also important to protect the knee from further injury. Any time any of these exercises cause you to have increased pain or swelling in your knee joint, decrease the amount until you are comfortable again and slowly increase them. If you have problems or questions, call your caregiver or physical therapist for advice.  ° °HOME CARE INSTRUCTIONS  °Remove items at home which could result in a fall. This includes throw rugs or furniture in walking pathways.  °· ICE to the affected knee every three hours for 30 minutes at a time and then as needed for pain and swelling.  Continue to use ice on the knee for pain and swelling from surgery. You may notice swelling that will progress down to the foot and ankle.  This is normal after surgery.  Elevate the leg when you are not up walking on it.   °· Continue to use the breathing machine which will help keep your temperature down.  It is common for your temperature to cycle up and down following surgery, especially at night when you are not up moving around and exerting yourself.  The breathing machine keeps your lungs expanded and your temperature down. °· Do not place pillow under knee, focus on keeping the knee straight while resting ° °DIET °You may resume your previous home diet once your are discharged from the hospital. ° °DRESSING / WOUND CARE / SHOWERING °You may start showering once you are discharged home but do not submerge the incision under water. Just pat the incision dry and apply a dry gauze dressing on daily. °Change the surgical dressing  daily and reapply a dry dressing each time. ° °ACTIVITY °Walk with your walker as instructed. °Use walker as long as suggested by your caregivers. °Avoid periods of inactivity such as sitting longer than an hour when not asleep. This helps prevent blood clots.  °You may resume a sexual relationship in one month or when given the OK by your doctor.  °You may return to work once you are cleared by your doctor.  °Do not drive a car for 6 weeks or until released by you surgeon.  °Do not drive while taking narcotics. ° °WEIGHT BEARING °Weight bearing as tolerated with assist device (walker, cane, etc) as directed, use it as long as suggested by your surgeon or therapist, typically at least 4-6 weeks. ° °POSTOPERATIVE CONSTIPATION PROTOCOL °Constipation - defined medically as fewer than three stools per week and severe constipation as less than one stool per week. ° °One of the most common issues patients have following surgery is constipation.  Even if you have a regular bowel pattern at home, your normal regimen is likely to be disrupted due to multiple reasons following surgery.  Combination of anesthesia, postoperative narcotics, change in appetite and fluid intake all can affect your bowels.  In order to avoid complications following surgery, here are some recommendations in order to help you during your recovery period. ° °Colace (docusate) - Pick up an over-the-counter form of Colace or another stool softener and take twice a day as long as you are requiring postoperative pain medications.  Take with a full glass of water   daily.  If you experience loose stools or diarrhea, hold the colace until you stool forms back up.  If your symptoms do not get better within 1 week or if they get worse, check with your doctor. ° °Dulcolax (bisacodyl) - Pick up over-the-counter and take as directed by the product packaging as needed to assist with the movement of your bowels.  Take with a full glass of water.  Use this product as  needed if not relieved by Colace only.  ° °MiraLax (polyethylene glycol) - Pick up over-the-counter to have on hand.  MiraLax is a solution that will increase the amount of water in your bowels to assist with bowel movements.  Take as directed and can mix with a glass of water, juice, soda, coffee, or tea.  Take if you go more than two days without a movement. °Do not use MiraLax more than once per day. Call your doctor if you are still constipated or irregular after using this medication for 7 days in a row. ° °If you continue to have problems with postoperative constipation, please contact the office for further assistance and recommendations.  If you experience "the worst abdominal pain ever" or develop nausea or vomiting, please contact the office immediatly for further recommendations for treatment. ° °ITCHING ° If you experience itching with your medications, try taking only a single pain pill, or even half a pain pill at a time.  You can also use Benadryl over the counter for itching or also to help with sleep.  ° °TED HOSE STOCKINGS °Wear the elastic stockings on both legs for three weeks following surgery during the day but you may remove then at night for sleeping. ° °MEDICATIONS °See your medication summary on the “After Visit Summary” that the nursing staff will review with you prior to discharge.  You may have some home medications which will be placed on hold until you complete the course of blood thinner medication.  It is important for you to complete the blood thinner medication as prescribed by your surgeon.  Continue your approved medications as instructed at time of discharge. ° °PRECAUTIONS °If you experience chest pain or shortness of breath - call 911 immediately for transfer to the hospital emergency department.  °If you develop a fever greater that 101 F, purulent drainage from wound, increased redness or drainage from wound, foul odor from the wound/dressing, or calf pain - CONTACT YOUR  SURGEON.   °                                                °FOLLOW-UP APPOINTMENTS °Make sure you keep all of your appointments after your operation with your surgeon and caregivers. You should call the office at the above phone number and make an appointment for approximately two weeks after the date of your surgery or on the date instructed by your surgeon outlined in the "After Visit Summary". ° ° °RANGE OF MOTION AND STRENGTHENING EXERCISES  °Rehabilitation of the knee is important following a knee injury or an operation. After just a few days of immobilization, the muscles of the thigh which control the knee become weakened and shrink (atrophy). Knee exercises are designed to build up the tone and strength of the thigh muscles and to improve knee motion. Often times heat used for twenty to thirty minutes before working out will loosen   up your tissues and help with improving the range of motion but do not use heat for the first two weeks following surgery. These exercises can be done on a training (exercise) mat, on the floor, on a table or on a bed. Use what ever works the best and is most comfortable for you Knee exercises include:  Leg Lifts - While your knee is still immobilized in a splint or cast, you can do straight leg raises. Lift the leg to 60 degrees, hold for 3 sec, and slowly lower the leg. Repeat 10-20 times 2-3 times daily. Perform this exercise against resistance later as your knee gets better.  Quad and Hamstring Sets - Tighten up the muscle on the front of the thigh (Quad) and hold for 5-10 sec. Repeat this 10-20 times hourly. Hamstring sets are done by pushing the foot backward against an object and holding for 5-10 sec. Repeat as with quad sets.   Leg Slides: Lying on your back, slowly slide your foot toward your buttocks, bending your knee up off the floor (only go as far as is comfortable). Then slowly slide your foot back down until your leg is flat on the floor again.  Angel Wings:  Lying on your back spread your legs to the side as far apart as you can without causing discomfort.  A rehabilitation program following serious knee injuries can speed recovery and prevent re-injury in the future due to weakened muscles. Contact your doctor or a physical therapist for more information on knee rehabilitation.   IF YOU ARE TRANSFERRED TO A SKILLED REHAB FACILITY If the patient is transferred to a skilled rehab facility following release from the hospital, a list of the current medications will be sent to the facility for the patient to continue.  When discharged from the skilled rehab facility, please have the facility set up the patient's Home Health Physical Therapy prior to being released. Also, the skilled facility will be responsible for providing the patient with their medications at time of release from the facility to include their pain medication, the muscle relaxants, and their blood thinner medication. If the patient is still at the rehab facility at time of the two week follow up appointment, the skilled rehab facility will also need to assist the patient in arranging follow up appointment in our office and any transportation needs.  MAKE SURE YOU:  Understand these instructions.  Get help right away if you are not doing well or get worse.    Pick up stool softner and laxative for home use following surgery while on pain medications. Do not submerge incision under water. Please use good hand washing techniques while changing dressing each day. May shower starting three days after surgery. Please use a clean towel to pat the incision dry following showers. Continue to use ice for pain and swelling after surgery. Do not use any lotions or creams on the incision until instructed by your surgeon.   Why was Xarelto prescribed for you? Xarelto was prescribed for you to reduce the risk of blood clots forming after orthopedic surgery. The medical term for these abnormal  blood clots is venous thromboembolism (VTE).  What do you need to know about xarelto ? Take your Xarelto ONCE DAILY at the same time every day. You may take it either with or without food.  If you have difficulty swallowing the tablet whole, you may crush it and mix in applesauce just prior to taking your dose.  Take Xarelto exactly as  prescribed by your doctor and DO NOT stop taking Xarelto without talking to the doctor who prescribed the medication.  Stopping without other VTE prevention medication to take the place of Xarelto may increase your risk of developing a clot.  After discharge, you should have regular check-up appointments with your healthcare provider that is prescribing your Xarelto.    What do you do if you miss a dose? If you miss a dose, take it as soon as you remember on the same day then continue your regularly scheduled once daily regimen the next day. Do not take two doses of Xarelto on the same day.   Important Safety Information A possible side effect of Xarelto is bleeding. You should call your healthcare provider right away if you experience any of the following: ? Bleeding from an injury or your nose that does not stop. ? Unusual colored urine (red or dark brown) or unusual colored stools (red or black). ? Unusual bruising for unknown reasons. ? A serious fall or if you hit your head (even if there is no bleeding).  Some medicines may interact with Xarelto and might increase your risk of bleeding while on Xarelto. To help avoid this, consult your healthcare provider or pharmacist prior to using any new prescription or non-prescription medications, including herbals, vitamins, non-steroidal anti-inflammatory drugs (NSAIDs) and supplements.  This website has more information on Xarelto: VisitDestination.com.br.

## 2016-08-10 NOTE — Progress Notes (Signed)
Physical Therapy Treatment Patient Details Name: Kristy Arias MRN: [id number] DOB: 27-Jul-1950 Today's Date: 08/10/2016    History of Present Illness Pt is a 66 yo female s/p L TKA    PT Comments    Pt ambulated again in hallway and able to tolerate increase in distance.  Pt also performed LE exercises in supine.  Pt provided with HEP handout.  Follow Up Recommendations  Outpatient PT     Equipment Recommendations  None recommended by PT    Recommendations for Other Services       Precautions / Restrictions Precautions Precautions: Fall;Knee Required Braces or Orthoses: Knee Immobilizer - Left Restrictions Weight Bearing Restrictions: No LLE Weight Bearing: Weight bearing as tolerated    Mobility  Bed Mobility Overal bed mobility: Needs Assistance Bed Mobility: Sit to Supine     Supine to sit: Min guard Sit to supine: Supervision   General bed mobility comments: Incr time  Transfers Overall transfer level: Needs assistance Equipment used: Rolling walker (2 wheeled) Transfers: Sit to/from Stand Sit to Stand: Min guard         General transfer comment: pt ambulating into room from bathroom with NT on arrival, cues for UE and LE positioning upon descent to bed  Ambulation/Gait Ambulation/Gait assistance: Min guard Ambulation Distance (Feet): 80 Feet Assistive device: Rolling walker (2 wheeled) Gait Pattern/deviations: Step-through pattern;Decreased stance time - left;Antalgic Gait velocity: decr   General Gait Details: improved abililty to perform heel strike, cues for RW positioning   Stairs            Wheelchair Mobility    Modified Rankin (Stroke Patients Only)       Balance                                            Cognition Arousal/Alertness: Awake/alert Behavior During Therapy: WFL for tasks assessed/performed Overall Cognitive Status: Within Functional Limits for tasks assessed                                        Exercises Total Joint Exercises Ankle Circles/Pumps: AROM;Both;10 reps Quad Sets: AROM;Left;10 reps Towel Squeeze: AROM;Both;10 reps Short Arc QuadBarbaraann Boys;Left;10 reps Heel Slides: AAROM;10 reps;Left Hip ABduction/ADduction: AROM;Left;10 reps Straight Leg Raises: AAROM;Left;10 reps Goniometric ROM: approx 35* AAROM knee flexion during heel slides supine    General Comments        Pertinent Vitals/Pain Pain Assessment: 0-10 Pain Score: 3  Pain Location: L knee Pain Descriptors / Indicators: Sore;Grimacing Pain Intervention(s): Limited activity within patient's tolerance;Monitored during session;Repositioned;Premedicated before session    Home Living                      Prior Function            PT Goals (current goals can now be found in the care plan section) Acute Rehab PT Goals Patient Stated Goal: Pt would like to return home PT Goal Formulation: With patient Time For Goal Achievement: 08/23/16 Potential to Achieve Goals: Good Progress towards PT goals: Progressing toward goals    Frequency    7X/week      PT Plan Current plan remains appropriate    Co-evaluation              AM-PAC PT "  6 Clicks" Daily Activity  Outcome Measure  Difficulty turning over in bed (including adjusting bedclothes, sheets and blankets)?: A Little Difficulty moving from lying on back to sitting on the side of the bed? : A Little Difficulty sitting down on and standing up from a chair with arms (Arias.g., wheelchair, bedside commode, etc,.)?: A Little Help needed moving to and from a bed to chair (including a wheelchair)?: A Little Help needed walking in hospital room?: A Little Help needed climbing 3-5 steps with a railing? : A Lot 6 Click Score: 17    End of Session Equipment Utilized During Treatment: Gait belt;Left knee immobilizer Activity Tolerance: Patient tolerated treatment well Patient left: in bed;with call bell/phone within  reach;with bed alarm set   PT Visit Diagnosis: Difficulty in walking, not elsewhere classified (R26.2)     Time: [id number] PT Time Calculation (min) (ACUTE ONLY): 27 min  Charges:  $Gait Training: 8-22 mins $Therapeutic Exercise: 8-22 mins                    G Codes:      Zenovia JarredKati Dezaree Arias, PT, DPT 08/10/2016 Pager: [phone]    Kristy Arias 08/10/2016, 4:19 PM

## 2016-08-10 NOTE — Progress Notes (Signed)
Physical Therapy Treatment Patient Details Name: Kristy Arias MRN: [id number] DOB: [birth year] Today's Date: 08/10/2016    History of Present Illness Pt is a 66 yo female s/p L TKA    PT Comments    Pt reports increased pain today but tolerated treatment well. Pt was given cues to focus more on heel strike during ambulation. Pt to attempt exercises during next session.   Follow Up Recommendations  Outpatient PT     Equipment Recommendations  None recommended by PT    Recommendations for Other Services       Precautions / Restrictions Precautions Precautions: Fall;Knee Required Braces or Orthoses: Knee Immobilizer - Left Restrictions Weight Bearing Restrictions: No LLE Weight Bearing: Weight bearing as tolerated    Mobility  Bed Mobility Overal bed mobility: Needs Assistance Bed Mobility: Supine to Sit     Supine to sit: Min guard     General bed mobility comments: Incr time  Transfers Overall transfer level: Needs assistance Equipment used: Rolling walker (2 wheeled) Transfers: Sit to/from Stand Sit to Stand: Min assist         General transfer comment: Pt initially felt dizzy after sitting up but it subsided quickly after 1 minute, min assist provided for stabilization upon standing  Ambulation/Gait Ambulation/Gait assistance: Min guard Ambulation Distance (Feet): 50 Feet Assistive device: Rolling walker (2 wheeled) Gait Pattern/deviations: Step-through pattern;Decreased stride length;Decreased stance time - left;Antalgic Gait velocity: decr   General Gait Details: verbal cues provided for sequence and heel strike, pt tolerated ambulation well   Stairs            Wheelchair Mobility    Modified Rankin (Stroke Patients Only)       Balance                                            Cognition Arousal/Alertness: Awake/alert Behavior During Therapy: WFL for tasks assessed/performed Overall Cognitive Status: Within  Functional Limits for tasks assessed                                        Exercises      General Comments        Pertinent Vitals/Pain Pain Assessment: 0-10 Pain Score: 5  Pain Location: L knee Pain Descriptors / Indicators: Sore;Grimacing Pain Intervention(s): Monitored during session;Limited activity within patient's tolerance;Repositioned    Home Living                      Prior Function            PT Goals (current goals can now be found in the care plan section) Acute Rehab PT Goals Patient Stated Goal: Pt would like to return home PT Goal Formulation: With patient Time For Goal Achievement: 08/23/16 Potential to Achieve Goals: Good Progress towards PT goals: Progressing toward goals    Frequency    7X/week      PT Plan Current plan remains appropriate    Co-evaluation              AM-PAC PT "6 Clicks" Daily Activity  Outcome Measure  Difficulty turning over in bed (including adjusting bedclothes, sheets and blankets)?: A Little Difficulty moving from lying on back to sitting on the side of the bed? : A  Little Difficulty sitting down on and standing up from a chair with arms (e.g., wheelchair, bedside commode, etc,.)?: Total Help needed moving to and from a bed to chair (including a wheelchair)?: Total Help needed walking in hospital room?: A Little Help needed climbing 3-5 steps with a railing? : A Little 6 Click Score: 14    End of Session Equipment Utilized During Treatment: Gait belt;Left knee immobilizer Activity Tolerance: Patient tolerated treatment well Patient left: in chair;with family/visitor present;with call bell/phone within reach   PT Visit Diagnosis: Difficulty in walking, not elsewhere classified (R26.2)     Time: 1610-96041138-1151 PT Time Calculation (min) (ACUTE ONLY): 13 min  Charges:  $Gait Training: 8-22 mins                    G Codes:       Marlene BastJoe Oreatha Fabry, SPT   Kathrene BongoJoseph Charnel Giles 08/10/2016,  1:45 PM

## 2016-08-10 NOTE — Progress Notes (Signed)
   Subjective: 1 Day Post-Op Procedure(s) (LRB): LEFT TOTAL KNEE ARTHROPLASTY (Left) Patient reports pain as moderate.  Pain increased significantly after block wore off last night Plan is to go Home after hospital stay.  Objective: Vital signs in last 24 hours: Temp:  [97 F (36.1 C)-98.5 F (36.9 C)] 98.5 F (36.9 C) (07/17 0456) Pulse Rate:  [52-75] 72 (07/17 0456) Resp:  [8-18] 17 (07/17 0456) BP: (120-172)/(63-90) 147/63 (07/17 0456) SpO2:  [97 %-100 %] 100 % (07/17 0456)  Intake/Output from previous day:  Intake/Output Summary (Last 24 hours) at 08/10/16 1017 Last data filed at 08/10/16 0737  Gross per 24 hour  Intake             1710 ml  Output             2515 ml  Net             -805 ml    Intake/Output this shift: No intake/output data recorded.  Labs:  Recent Labs  08/10/16 0557  HGB 11.7*    Recent Labs  08/10/16 0557  WBC 17.0*  RBC 3.66*  HCT 33.9*  PLT 271    Recent Labs  08/10/16 0557  NA 137  K 3.3*  CL 100*  CO2 26  BUN 7  CREATININE 0.76  GLUCOSE 136*  CALCIUM 8.6*   No results for input(s): LABPT, INR in the last 72 hours.  EXAM General - Patient is Alert, Appropriate and Oriented Extremity - Neurologically intact Neurovascular intact Dressing - dressing C/D/I Motor Function - intact, moving foot and toes well on exam.  Hemovac pulled without difficulty.  Past Medical History:  Diagnosis Date  . Anemia   . Anxiety   . Arthritis   . Depression   . GERD (gastroesophageal reflux disease)   . Glaucoma   . Headache   . Heart murmur   . Hyperlipidemia   . Hypertension   . Hypothyroidism   . Shortness of breath    syndrome x  . Syndrome X, cardiac (HCC)     Assessment/Plan: 1 Day Post-Op Procedure(s) (LRB): LEFT TOTAL KNEE ARTHROPLASTY (Left) Active Problems:   OA (osteoarthritis) of knee   Advance diet Up with therapy D/C IV fluids Plan for discharge tomorrow  DVT Prophylaxis - Xarelto Weight-Bearing as  tolerated to left leg   Shadiyah Wernli V 08/10/2016, 10:17 AM

## 2016-08-10 NOTE — Progress Notes (Signed)
OT Cancellation Note  Patient Details Name: Kristy Arias MRN: [id number] DOB: 05/15/1950   Cancelled Treatment:    Reason Eval/Treat Not Completed: Fatigue/lethargy limiting ability to participate  OT will perform education in the morning prior to DC [place], [place] [phone]  Alba CoryREDDING, [name] 08/10/2016, 1:20 PM

## 2016-08-10 NOTE — Progress Notes (Signed)
Discharge planning, no HH needs identified. Plan for OP PT, has DME from previous surgeries. [phone]

## 2016-08-11 LAB — BASIC METABOLIC PANEL
ANION GAP: 10 (ref 5–15)
BUN: 13 mg/dL (ref 6–20)
CALCIUM: 8.9 mg/dL (ref 8.9–10.3)
CO2: 26 mmol/L (ref 22–32)
CREATININE: 0.94 mg/dL (ref 0.44–1.00)
Chloride: 102 mmol/L (ref 101–111)
Glucose, Bld: 144 mg/dL — ABNORMAL HIGH (ref 65–99)
Potassium: 4.3 mmol/L (ref 3.5–5.1)
SODIUM: 138 mmol/L (ref 135–145)

## 2016-08-11 LAB — CBC
HEMATOCRIT: 33.7 % — AB (ref 36.0–46.0)
Hemoglobin: 11.4 g/dL — ABNORMAL LOW (ref 12.0–15.0)
MCH: 31.5 pg (ref 26.0–34.0)
MCHC: 33.8 g/dL (ref 30.0–36.0)
MCV: 93.1 fL (ref 78.0–100.0)
PLATELETS: 256 10*3/uL (ref 150–400)
RBC: 3.62 MIL/uL — ABNORMAL LOW (ref 3.87–5.11)
RDW: 13.7 % (ref 11.5–15.5)
WBC: 16.7 10*3/uL — AB (ref 4.0–10.5)

## 2016-08-11 NOTE — Progress Notes (Signed)
Patient verbalized understanding of discharge instructions. Patient is stable at discharge. 

## 2016-08-11 NOTE — Evaluation (Signed)
Occupational Therapy Evaluation Patient Details Name: Kristy Arias MRN: [id number] DOB: [birth year] Today's Date: 08/11/2016    History of Present Illness Pt is a 66 yo female s/p L TKA   Clinical Impression   OT education complete. No DMe needed.      Follow Up Recommendations  No OT follow up    Equipment Recommendations  None recommended by OT    Recommendations for Other Services       Precautions / Restrictions Precautions Precautions: Fall;Knee Required Braces or Orthoses: Knee Immobilizer - Left Restrictions LLE Weight Bearing: Weight bearing as tolerated      Mobility Bed Mobility               General bed mobility comments: pt in chair  Transfers Overall transfer level: Needs assistance Equipment used: Rolling walker (2 wheeled) Transfers: Sit to/from Stand Sit to Stand: Min guard              Balance                                           ADL either performed or assessed with clinical judgement   ADL Overall ADL's : Needs assistance/impaired             Lower Body Bathing: Minimal assistance;Sit to/from stand;Cueing for safety;Cueing for sequencing;Cueing for compensatory techniques       Lower Body Dressing: Minimal assistance;Sit to/from stand;Cueing for safety;Cueing for sequencing;Cueing for compensatory techniques   Toilet Transfer: Minimal assistance;RW;Ambulation   Toileting- Clothing Manipulation and Hygiene: Minimal assistance;Sit to/from stand;Cueing for safety;Cueing for sequencing   Tub/ Shower Transfer: Education officer, environmental Details (indicate cue type and reason): verbalized safeyu   General ADL Comments: pt with posterior lean during ADL's. Educated on Media planner Patient Visual Report: No change from baseline       Perception     Praxis      Pertinent Vitals/Pain Pain Score: 3  Pain Location: L knee Pain Descriptors / Indicators: Sore;Grimacing Pain  Intervention(s): Limited activity within patient's tolerance;Monitored during session;Premedicated before session     Hand Dominance     Extremity/Trunk Assessment Upper Extremity Assessment Upper Extremity Assessment: Overall WFL for tasks assessed           Communication Communication Communication: No difficulties   Cognition Arousal/Alertness: Awake/alert Behavior During Therapy: WFL for tasks assessed/performed Overall Cognitive Status: Within Functional Limits for tasks assessed                                     General Comments       Exercises     Shoulder Instructions      Home Living Family/patient expects to be discharged to:: Private residence Living Arrangements: Spouse/significant other Available Help at Discharge: Family Type of Home: House Home Access: Stairs to enter Secretary/administrator of Steps: 2 Entrance Stairs-Rails: Right Home Layout: Two level (Pt reports being able to live on main level if need be) Alternate Level Stairs-Number of Steps: 1 flight    Bathroom Shower/Tub: Producer, television/film/video: Handicapped height     Home Equipment: Environmental consultant - 2 wheels          Prior Functioning/Environment Level of Independence: Independent  OT Problem List:        OT Treatment/Interventions:      OT Goals(Current goals can be found in the care plan section) Acute Rehab OT Goals Patient Stated Goal: Pt would like to return home  OT Frequency:     Barriers to D/C:            Co-evaluation              AM-PAC PT "6 Clicks" Daily Activity     Outcome Measure Help from another person eating meals?: None Help from another person taking care of personal grooming?: None Help from another person toileting, which includes using toliet, bedpan, or urinal?: A Little Help from another person bathing (including washing, rinsing, drying)?: A Little Help from another person to put on and taking  off regular upper body clothing?: None Help from another person to put on and taking off regular lower body clothing?: A Little 6 Click Score: 21   End of Session Equipment Utilized During Treatment: Rolling walker  Activity Tolerance: Patient tolerated treatment well Patient left: in chair;with call bell/phone within reach                   Time: 0904-0925 OT Time Calculation (min): 21 min Charges:  OT General Charges $OT Visit: 1 Procedure OT Evaluation $OT Eval Low Complexity: 1 Procedure G-Codes:     Lise AuerLori Mayleigh Tetrault, OT [phone]  Einar CrowEDDING, Rosie Torrez D 08/11/2016, 10:44 AM

## 2016-08-11 NOTE — Progress Notes (Signed)
   Subjective: 2 Days Post-Op Procedure(s) (LRB): LEFT TOTAL KNEE ARTHROPLASTY (Left) Patient reports pain as mild.   Patient seen in rounds for Dr. Lequita HaltAluisio. Patient is well, and has had no acute complaints or problems. Reports that she feels "pretty good". No SOB or chest pain. Voiding well. Positive flatus.    Objective: Vital signs in last 24 hours: Temp:  [98 F (36.7 C)-99.7 F (37.6 C)] 99.7 F (37.6 C) (07/18 0526) Pulse Rate:  [71-103] 103 (07/18 0526) Resp:  [16-18] 17 (07/18 0526) BP: (116-146)/(63-81) 116/63 (07/18 0526) SpO2:  [98 %-100 %] 98 % (07/18 0526)  Intake/Output from previous day:  Intake/Output Summary (Last 24 hours) at 08/11/16 1107 Last data filed at 08/11/16 0940  Gross per 24 hour  Intake              580 ml  Output             1125 ml  Net             -545 ml    Intake/Output this shift: Total I/O In: 240 [P.O.:240] Out: -   Labs:  Recent Labs  08/10/16 0557 08/11/16 0558  HGB 11.7* 11.4*    Recent Labs  08/10/16 0557 08/11/16 0558  WBC 17.0* 16.7*  RBC 3.66* 3.62*  HCT 33.9* 33.7*  PLT 271 256    Recent Labs  08/10/16 0557 08/11/16 0558  NA 137 138  K 3.3* 4.3  CL 100* 102  CO2 26 26  BUN 7 13  CREATININE 0.76 0.94  GLUCOSE 136* 144*  CALCIUM 8.6* 8.9    EXAM General - Patient is Alert and Oriented Extremity - Neurologically intact Intact pulses distally Dorsiflexion/Plantar flexion intact No cellulitis present Compartment soft Dressing/Incision - clean, dry, no drainage Motor Function - intact, moving foot and toes well on exam.   Past Medical History:  Diagnosis Date  . Anemia   . Anxiety   . Arthritis   . Depression   . GERD (gastroesophageal reflux disease)   . Glaucoma   . Headache   . Heart murmur   . Hyperlipidemia   . Hypertension   . Hypothyroidism   . Shortness of breath    syndrome x  . Syndrome X, cardiac (HCC)     Assessment/Plan: 2 Days Post-Op Procedure(s) (LRB): LEFT TOTAL  KNEE ARTHROPLASTY (Left) Active Problems:   OA (osteoarthritis) of knee  Estimated body mass index is 21.77 kg/m as calculated from the following:   Height as of this encounter: 5' 2.25" (1.581 m).   Weight as of this encounter: 54.4 kg (120 lb). Advance diet Up with therapy  DVT Prophylaxis - Xarelto Weight-Bearing as tolerated   She is progressing very well. Will do therapy this morning and plan for DC home this afternoon. Will start outpatient [facility] at [facility].   Dimitri PedAmber Antonisha Waskey, PA-C Orthopaedic Surgery 08/11/2016, 11:07 AM

## 2016-08-11 NOTE — Progress Notes (Signed)
Physical Therapy Treatment Patient Details Name: Kristy Arias MRN: [id number] DOB: [birth year] Today's Date: 08/11/2016    History of Present Illness Pt is a 66 yo female s/p L TKA    PT Comments    Pt tolerated treatment well and was able to perform steps with minimal cues. Pt and spouse demonstrated understanding of exercises and stair mobility. Pt and spouse feel confident and ready to pursue outpatient PT.   Follow Up Recommendations  Outpatient PT     Equipment Recommendations  None recommended by PT    Recommendations for Other Services       Precautions / Restrictions Precautions Precautions: Fall;Knee Required Braces or Orthoses: Knee Immobilizer - Left Restrictions Weight Bearing Restrictions: No LLE Weight Bearing: Weight bearing as tolerated    Mobility  Bed Mobility Overal bed mobility:  (Pt was oob when PT arrived)             General bed mobility comments: pt in chair  Transfers Overall transfer level: Needs assistance Equipment used: Rolling walker (2 wheeled) Transfers: Sit to/from Stand Sit to Stand: Supervision         General transfer comment: Pt was able to transfer with supervision, pt was able to recall cues for placement of L LE and hand placement on RW  Ambulation/Gait Ambulation/Gait assistance: Min guard Ambulation Distance (Feet): 100 Feet Assistive device: Rolling walker (2 wheeled) Gait Pattern/deviations: Step-through pattern;Decreased stance time - left;Antalgic Gait velocity: decr   General Gait Details: Pt with improved ability to perform heel strike during ambulation   Stairs Stairs: Yes   Stair Management: Two rails;Step to pattern;Forwards Number of Stairs: 2 General stair comments: Pt performed two repetitions on the stairs and required minimal guarding for safety, pt recalled cues and sequencing from last TKA and had good support from spouse  Wheelchair Mobility    Modified Rankin (Stroke Patients Only)        Balance                                            Cognition Arousal/Alertness: Awake/alert Behavior During Therapy: WFL for tasks assessed/performed Overall Cognitive Status: Within Functional Limits for tasks assessed                                        Exercises      General Comments        Pertinent Vitals/Pain Pain Assessment: 0-10 Pain Score: 3  Pain Location: L knee Pain Descriptors / Indicators: Sore;Grimacing Pain Intervention(s): Ice applied;Monitored during session;Repositioned;Limited activity within patient's tolerance    Home Living Family/patient expects to be discharged to:: Private residence Living Arrangements: Spouse/significant other Available Help at Discharge: Family Type of Home: House Home Access: Stairs to enter Entrance Stairs-Rails: Right Home Layout: Two level (Pt reports being able to live on main level if need be) Home Equipment: Walker - 2 wheels      Prior Function Level of Independence: Independent          PT Goals (current goals can now be found in the care plan section) Acute Rehab PT Goals Patient Stated Goal: Pt would like to return home PT Goal Formulation: With patient Time For Goal Achievement: 08/23/16 Potential to Achieve Goals: Good Progress towards PT goals: Progressing toward goals  Frequency    7X/week      PT Plan Current plan remains appropriate    Co-evaluation              AM-PAC PT "6 Clicks" Daily Activity  Outcome Measure  Difficulty turning over in bed (including adjusting bedclothes, sheets and blankets)?: A Little Difficulty moving from lying on back to sitting on the side of the bed? : A Little Difficulty sitting down on and standing up from a chair with arms (e.g., wheelchair, bedside commode, etc,.)?: A Little Help needed moving to and from a bed to chair (including a wheelchair)?: A Little Help needed walking in hospital room?: A  Little Help needed climbing 3-5 steps with a railing? : A Little 6 Click Score: 18    End of Session Equipment Utilized During Treatment: Gait belt;Left knee immobilizer Activity Tolerance: Patient tolerated treatment well Patient left: with family/visitor present;with call bell/phone within reach;in chair   PT Visit Diagnosis: Difficulty in walking, not elsewhere classified (R26.2)     Time: 1002-1020 PT Time Calculation (min) (ACUTE ONLY): 18 min  Charges:  $Gait Training: 8-22 mins                    G Codes:       Marlene BastJoe Lissette Schenk, SPT   Kathrene BongoJoseph Aqeel Norgaard 08/11/2016, 2:18 PM

## 2016-08-12 NOTE — Discharge Summary (Signed)
Physician Discharge Summary   Patient ID: Kristy Arias MRN: [id number] DOB/AGE: Apr 18, 1950 66 y.o.  Admit date: 08/09/2016 Discharge date: 08/11/2016  Primary Diagnosis: Primary osteoarthritis left knee   Admission Diagnoses:  Past Medical History:  Diagnosis Date  . Anemia   . Anxiety   . Arthritis   . Depression   . GERD (gastroesophageal reflux disease)   . Glaucoma   . Headache   . Heart murmur   . Hyperlipidemia   . Hypertension   . Hypothyroidism   . Shortness of breath    syndrome x  . Syndrome X, cardiac [facility])    Discharge Diagnoses:   Active Problems:   OA (osteoarthritis) of knee  Estimated body mass index is 21.77 kg/m as calculated from the following:   Height as of this encounter: 5' 2.25" (1.581 m).   Weight as of this encounter: 54.4 kg (120 lb).  Procedure:  Procedure(s) (LRB): LEFT TOTAL KNEE ARTHROPLASTY (Left)   Consults: None  HPI: The patient is a 66 year old female who presented for follow up of their knee. The patient is being followed for their left knee pain and osteoarthritis. They are now months out from cortisone injection. Symptoms reported include: pain, aching and difficulty ambulating. The patient feels that they are doing poorly and report their pain level to be mild to moderate. The patient has reported improvement of their symptoms with: Cortisone injections (helps some). She would like to have the left knee replaced. The right knee which we replaced last year, is doing extremely well. She is not having pain on the right knee. The left knee is very problematic for her. It hurts at all times. It is limiting what she can and cannot do. Recent cortisone injection did not help. She is at a stage where she is ready to go ahead and get the left knee replaced. They have been treated conservatively in the past for the above stated problem and despite conservative measures, they continue to have progressive pain and severe functional limitations  and dysfunction. They have failed non-operative management including home exercise, medications, and injections. It is felt that they would benefit from undergoing total joint replacement. Risks and benefits of the procedure have been discussed with the patient and they elect to proceed with surgery. There are no active contraindications to surgery such as ongoing infection or rapidly progressive neurological disease.   Laboratory Data: Admission on 08/09/2016, Discharged on 08/11/2016  Component Date Value Ref Range Status  . Glucose-Capillary 08/09/2016 79  65 - 99 mg/dL Final  . Comment 1 08/09/2016 Notify RN   Final  . Comment 2 08/09/2016 Document in Chart   Final  . WBC 08/10/2016 17.0* 4.0 - 10.5 K/uL Final  . RBC 08/10/2016 3.66* 3.87 - 5.11 MIL/uL Final  . Hemoglobin 08/10/2016 11.7* 12.0 - 15.0 g/dL Final  . HCT 08/10/2016 33.9* 36.0 - 46.0 % Final  . MCV 08/10/2016 92.6  78.0 - 100.0 fL Final  . MCH 08/10/2016 32.0  26.0 - 34.0 pg Final  . MCHC 08/10/2016 34.5  30.0 - 36.0 g/dL Final  . RDW 08/10/2016 13.4  11.5 - 15.5 % Final  . Platelets 08/10/2016 271  150 - 400 K/uL Final  . Sodium 08/10/2016 137  135 - 145 mmol/L Final  . Potassium 08/10/2016 3.3* 3.5 - 5.1 mmol/L Final  . Chloride 08/10/2016 100* 101 - 111 mmol/L Final  . CO2 08/10/2016 26  22 - 32 mmol/L Final  . Glucose, Bld  08/10/2016 136* 65 - 99 mg/dL Final  . BUN 08/10/2016 7  6 - 20 mg/dL Final  . Creatinine, Ser 08/10/2016 0.76  0.44 - 1.00 mg/dL Final  . Calcium 08/10/2016 8.6* 8.9 - 10.3 mg/dL Final  . GFR calc non Af Amer 08/10/2016 >60  >60 mL/min Final  . GFR calc Af Amer 08/10/2016 >60  >60 mL/min Final   Comment: (NOTE) The eGFR has been calculated using the CKD EPI equation. This calculation has not been validated in all clinical situations. eGFR's persistently <60 mL/min signify possible Chronic Kidney Disease.   . Anion gap 08/10/2016 11  5 - 15 Final  . Glucose-Capillary 08/09/2016 215* 65 - 99  mg/dL Final  . Glucose-Capillary 08/09/2016 174* 65 - 99 mg/dL Final  . Glucose-Capillary 08/10/2016 133* 65 - 99 mg/dL Final  . WBC 08/11/2016 16.7* 4.0 - 10.5 K/uL Final  . RBC 08/11/2016 3.62* 3.87 - 5.11 MIL/uL Final  . Hemoglobin 08/11/2016 11.4* 12.0 - 15.0 g/dL Final  . HCT 08/11/2016 33.7* 36.0 - 46.0 % Final  . MCV 08/11/2016 93.1  78.0 - 100.0 fL Final  . MCH 08/11/2016 31.5  26.0 - 34.0 pg Final  . MCHC 08/11/2016 33.8  30.0 - 36.0 g/dL Final  . RDW 08/11/2016 13.7  11.5 - 15.5 % Final  . Platelets 08/11/2016 256  150 - 400 K/uL Final  . Sodium 08/11/2016 138  135 - 145 mmol/L Final  . Potassium 08/11/2016 4.3  3.5 - 5.1 mmol/L Final   Comment: DELTA CHECK NOTED NO VISIBLE HEMOLYSIS   . Chloride 08/11/2016 102  101 - 111 mmol/L Final  . CO2 08/11/2016 26  22 - 32 mmol/L Final  . Glucose, Bld 08/11/2016 144* 65 - 99 mg/dL Final  . BUN 08/11/2016 13  6 - 20 mg/dL Final  . Creatinine, Ser 08/11/2016 0.94  0.44 - 1.00 mg/dL Final  . Calcium 08/11/2016 8.9  8.9 - 10.3 mg/dL Final  . GFR calc non Af Amer 08/11/2016 >60  >60 mL/min Final  . GFR calc Af Amer 08/11/2016 >60  >60 mL/min Final   Comment: (NOTE) The eGFR has been calculated using the CKD EPI equation. This calculation has not been validated in all clinical situations. eGFR's persistently <60 mL/min signify possible Chronic Kidney Disease.   . Anion gap 08/11/2016 10  5 - 15 Final  . Glucose-Capillary 08/10/2016 159* 65 - 99 mg/dL Final  . Glucose-Capillary 08/10/2016 147* 65 - 99 mg/dL Final  Hospital Outpatient Visit on 08/04/2016  Component Date Value Ref Range Status  . aPTT 08/04/2016 35  24 - 36 seconds Final  . WBC 08/04/2016 5.9  4.0 - 10.5 K/uL Final  . RBC 08/04/2016 4.23  3.87 - 5.11 MIL/uL Final  . Hemoglobin 08/04/2016 13.5  12.0 - 15.0 g/dL Final  . HCT 08/04/2016 39.4  36.0 - 46.0 % Final  . MCV 08/04/2016 93.1  78.0 - 100.0 fL Final  . MCH 08/04/2016 31.9  26.0 - 34.0 pg Final  . MCHC  08/04/2016 34.3  30.0 - 36.0 g/dL Final  . RDW 08/04/2016 13.5  11.5 - 15.5 % Final  . Platelets 08/04/2016 265  150 - 400 K/uL Final  . Sodium 08/04/2016 141  135 - 145 mmol/L Final  . Potassium 08/04/2016 4.3  3.5 - 5.1 mmol/L Final  . Chloride 08/04/2016 105  101 - 111 mmol/L Final  . CO2 08/04/2016 28  22 - 32 mmol/L Final  . Glucose, Bld 08/04/2016 81  65 - 99 mg/dL Final  . BUN 08/04/2016 10  6 - 20 mg/dL Final  . Creatinine, Ser 08/04/2016 0.86  0.44 - 1.00 mg/dL Final  . Calcium 08/04/2016 9.1  8.9 - 10.3 mg/dL Final  . Total Protein 08/04/2016 6.9  6.5 - 8.1 g/dL Final  . Albumin 08/04/2016 3.9  3.5 - 5.0 g/dL Final  . AST 08/04/2016 29  15 - 41 U/L Final  . ALT 08/04/2016 18  14 - 54 U/L Final  . Alkaline Phosphatase 08/04/2016 104  38 - 126 U/L Final  . Total Bilirubin 08/04/2016 0.5  0.3 - 1.2 mg/dL Final  . GFR calc non Af Amer 08/04/2016 >60  >60 mL/min Final  . GFR calc Af Amer 08/04/2016 >60  >60 mL/min Final   Comment: (NOTE) The eGFR has been calculated using the CKD EPI equation. This calculation has not been validated in all clinical situations. eGFR's persistently <60 mL/min signify possible Chronic Kidney Disease.   . Anion gap 08/04/2016 8  5 - 15 Final  . Prothrombin Time 08/04/2016 13.0  11.4 - 15.2 seconds Final  . INR 08/04/2016 0.98   Final  . ABO/RH(D) 08/04/2016 O POS   Final  . Antibody Screen 08/04/2016 NEG   Final  . Sample Expiration 08/04/2016 08/12/2016   Final  . Extend sample reason 08/04/2016 NO TRANSFUSIONS OR PREGNANCY IN THE PAST 3 MONTHS   Final  . MRSA, PCR 08/04/2016 NEGATIVE  NEGATIVE Final  . Staphylococcus aureus 08/04/2016 NEGATIVE  NEGATIVE Final   Comment:        The Xpert SA Assay (FDA approved for NASAL specimens in patients over 50 years of age), is one component of a comprehensive surveillance program.  Test performance has been validated by [facility] for patients greater than or equal to 22 year old. It is not  intended to diagnose infection nor to guide or monitor treatment.   . Hgb A1c MFr Bld 08/04/2016 5.1  4.8 - 5.6 % Final   Comment: (NOTE)         Pre-diabetes: 5.7 - 6.4         Diabetes: >6.4         Glycemic control for adults with diabetes: <7.0   . Mean Plasma Glucose 08/04/2016 100  mg/dL Final   Comment: (NOTE) Performed At: [facility] [place], [place] [id number] Lindon Romp MD [id number]   . Glucose-Capillary 08/04/2016 94  65 - 99 mg/dL Final  Lab on 07/14/2016  Component Date Value Ref Range Status  . Cholesterol 07/14/2016 314* 0 - 200 mg/dL Final   ATP III Classification       Desirable:  < 200 mg/dL               Borderline High:  200 - 239 mg/dL          High:  > = 240 mg/dL  . Triglycerides 07/14/2016 172.0* 0.0 - 149.0 mg/dL Final   Normal:  <150 mg/dLBorderline High:  150 - 199 mg/dL  . HDL 07/14/2016 57.20  >39.00 mg/dL Final  . VLDL 07/14/2016 34.4  0.0 - 40.0 mg/dL Final  . LDL Cholesterol 07/14/2016 222* 0 - 99 mg/dL Final  . Total CHOL/HDL Ratio 07/14/2016 5   Final                  Men          Women1/2 Average Risk     3.4  3.3Average Risk          5.0          4.42X Average Risk          9.6          7.13X Average Risk          15.0          11.0                      . NonHDL 07/14/2016 256.75   Final   NOTE:  Non-HDL goal should be 30 mg/dL higher than patient's LDL goal (i.e. LDL goal of < 70 mg/dL, would have non-HDL goal of < 100 mg/dL)      Hospital Course: ELIANE HAMMERSMITH is a 66 y.o. who was admitted to [facility]. They were brought to the operating room on 08/09/2016 and underwent Procedure(s): LEFT TOTAL KNEE ARTHROPLASTY.  Patient tolerated the procedure well and was later transferred to the recovery room and then to the orthopaedic floor for postoperative care.  They were given PO and IV analgesics for pain control following their surgery.  They were given 24 hours of postoperative antibiotics of    Anti-infectives    Start     Dose/Rate Route Frequency Ordered Stop   08/09/16 1530  ceFAZolin (ANCEF) IVPB 2g/100 mL premix     2 g 200 mL/hr over 30 Minutes Intravenous Every 6 hours 08/09/16 1221 08/09/16 2144   08/09/16 0742  ceFAZolin (ANCEF) 2-4 GM/100ML-% IVPB    Comments:  Waldron Session   : cabinet override      08/09/16 0742 08/09/16 0912   08/09/16 0737  ceFAZolin (ANCEF) IVPB 2g/100 mL premix     2 g 200 mL/hr over 30 Minutes Intravenous On call to O.R. 08/09/16 0156 08/09/16 0942     and started on DVT prophylaxis in the form of Xarelto.   PT and OT were ordered for total joint protocol.  Discharge planning consulted to help with postop disposition and equipment needs.  Patient had a good night on the evening of surgery.  They started to get up OOB with therapy on day one. Hemovac drain was pulled without difficulty.  Continued to work with therapy into day two.  Dressing was changed on day two and the incision was clean and dry.  The patient had progressed with therapy and meeting their goals.  Incision was healing well.  Patient was seen in rounds and was ready to go home.   Diet: Cardiac diet Activity:WBAT Follow-up:in 2 weeks Disposition - Home Discharged Condition: stable   Discharge Instructions    Call MD / Call 911    Complete by:  As directed    If you experience chest pain or shortness of breath, CALL 911 and be transported to the hospital emergency room.  If you develope a fever above 101 F, pus (white drainage) or increased drainage or redness at the wound, or calf pain, call your surgeon's office.   Constipation Prevention    Complete by:  As directed    Drink plenty of fluids.  Prune juice may be helpful.  You may use a stool softener, such as Colace (over the counter) 100 mg twice a day.  Use MiraLax (over the counter) for constipation as needed.   Diet - low sodium heart healthy    Complete by:  As directed    Discharge instructions    Complete by:  As  directed    Dr. Gaynelle Arabian Total Joint Specialist [facility] [address], [place], [place] [address] [phone]  TOTAL KNEE REPLACEMENT POSTOPERATIVE DIRECTIONS  Knee Rehabilitation, Guidelines Following Surgery  Results after knee surgery are often greatly improved when you follow the exercise, range of motion and muscle strengthening exercises prescribed by your doctor. Safety measures are also important to protect the knee from further injury. Any time any of these exercises cause you to have increased pain or swelling in your knee joint, decrease the amount until you are comfortable again and slowly increase them. If you have problems or questions, call your caregiver or physical therapist for advice.   HOME CARE INSTRUCTIONS  Remove items at home which could result in a fall. This includes throw rugs or furniture in walking pathways.  ICE to the affected knee every three hours for 30 minutes at a time and then as needed for pain and swelling.  Continue to use ice on the knee for pain and swelling from surgery. You may notice swelling that will progress down to the foot and ankle.  This is normal after surgery.  Elevate the leg when you are not up walking on it.   Continue to use the breathing machine which will help keep your temperature down.  It is common for your temperature to cycle up and down following surgery, especially at night when you are not up moving around and exerting yourself.  The breathing machine keeps your lungs expanded and your temperature down. Do not place pillow under knee, focus on keeping the knee straight while resting  DIET You may resume your previous home diet once your are discharged from the hospital.  DRESSING / WOUND CARE / SHOWERING You may start showering once you are discharged home but do not submerge the incision under water. Just pat the incision dry and apply a dry gauze dressing on daily. Change the surgical  dressing daily and reapply a dry dressing each time.  ACTIVITY Walk with your walker as instructed. Use walker as long as suggested by your caregivers. Avoid periods of inactivity such as sitting longer than an hour when not asleep. This helps prevent blood clots.  You may resume a sexual relationship in one month or when given the OK by your doctor.  You may return to work once you are cleared by your doctor.  Do not drive a car for 6 weeks or until released by you surgeon.  Do not drive while taking narcotics.  WEIGHT BEARING Weight bearing as tolerated with assist device (walker, cane, etc) as directed, use it as long as suggested by your surgeon or therapist, typically at least 4-6 weeks.  POSTOPERATIVE CONSTIPATION PROTOCOL Constipation - defined medically as fewer than three stools per week and severe constipation as less than one stool per week.  One of the most common issues patients have following surgery is constipation.  Even if you have a regular bowel pattern at home, your normal regimen is likely to be disrupted due to multiple reasons following surgery.  Combination of anesthesia, postoperative narcotics, change in appetite and fluid intake all can affect your bowels.  In order to avoid complications following surgery, here are some recommendations in order to help you during your recovery period.  Colace (docusate) - Pick up an over-the-counter form of Colace or another stool softener and take twice a day as long as you are requiring postoperative pain medications.  Take with a full glass of water  daily.  If you experience loose stools or diarrhea, hold the colace until you stool forms back up.  If your symptoms do not get better within 1 week or if they get worse, check with your doctor.  Dulcolax (bisacodyl) - Pick up over-the-counter and take as directed by the product packaging as needed to assist with the movement of your bowels.  Take with a full glass of water.  Use this  product as needed if not relieved by Colace only.   MiraLax (polyethylene glycol) - Pick up over-the-counter to have on hand.  MiraLax is a solution that will increase the amount of water in your bowels to assist with bowel movements.  Take as directed and can mix with a glass of water, juice, soda, coffee, or tea.  Take if you go more than two days without a movement. Do not use MiraLax more than once per day. Call your doctor if you are still constipated or irregular after using this medication for 7 days in a row.  If you continue to have problems with postoperative constipation, please contact the office for further assistance and recommendations.  If you experience "the worst abdominal pain ever" or develop nausea or vomiting, please contact the office immediatly for further recommendations for treatment.  ITCHING  If you experience itching with your medications, try taking only a single pain pill, or even half a pain pill at a time.  You can also use Benadryl over the counter for itching or also to help with sleep.   TED HOSE STOCKINGS Wear the elastic stockings on both legs for three weeks following surgery during the day but you may remove then at night for sleeping.  MEDICATIONS See your medication summary on the "After Visit Summary" that the nursing staff will review with you prior to discharge.  You may have some home medications which will be placed on hold until you complete the course of blood thinner medication.  It is important for you to complete the blood thinner medication as prescribed by your surgeon.  Continue your approved medications as instructed at time of discharge.  PRECAUTIONS If you experience chest pain or shortness of breath - call 911 immediately for transfer to the hospital emergency department.  If you develop a fever greater that 101 F, purulent drainage from wound, increased redness or drainage from wound, foul odor from the wound/dressing, or calf pain -  CONTACT YOUR SURGEON.                                                   FOLLOW-UP APPOINTMENTS Make sure you keep all of your appointments after your operation with your surgeon and caregivers. You should call the office at the above phone number and make an appointment for approximately two weeks after the date of your surgery or on the date instructed by your surgeon outlined in the "After Visit Summary".   RANGE OF MOTION AND STRENGTHENING EXERCISES  Rehabilitation of the knee is important following a knee injury or an operation. After just a few days of immobilization, the muscles of the thigh which control the knee become weakened and shrink (atrophy). Knee exercises are designed to build up the tone and strength of the thigh muscles and to improve knee motion. Often times heat used for twenty to thirty minutes before working out will loosen  up your tissues and help with improving the range of motion but do not use heat for the first two weeks following surgery. These exercises can be done on a training (exercise) mat, on the floor, on a table or on a bed. Use what ever works the best and is most comfortable for you Knee exercises include:  Leg Lifts - While your knee is still immobilized in a splint or cast, you can do straight leg raises. Lift the leg to 60 degrees, hold for 3 sec, and slowly lower the leg. Repeat 10-20 times 2-3 times daily. Perform this exercise against resistance later as your knee gets better.  Quad and Hamstring Sets - Tighten up the muscle on the front of the thigh (Quad) and hold for 5-10 sec. Repeat this 10-20 times hourly. Hamstring sets are done by pushing the foot backward against an object and holding for 5-10 sec. Repeat as with quad sets.  Leg Slides: Lying on your back, slowly slide your foot toward your buttocks, bending your knee up off the floor (only go as far as is comfortable). Then slowly slide your foot back down until your leg is flat on the floor  again. Angel Wings: Lying on your back spread your legs to the side as far apart as you can without causing discomfort.  A rehabilitation program following serious knee injuries can speed recovery and prevent re-injury in the future due to weakened muscles. Contact your doctor or a physical therapist for more information on knee rehabilitation.   IF YOU ARE TRANSFERRED TO A SKILLED REHAB FACILITY If the patient is transferred to a skilled rehab facility following release from the hospital, a list of the current medications will be sent to the facility for the patient to continue.  When discharged from the skilled rehab facility, please have the facility set up the patient's [place] prior to being released. Also, the skilled facility will be responsible for providing the patient with their medications at time of release from the facility to include their pain medication, the muscle relaxants, and their blood thinner medication. If the patient is still at the rehab facility at time of the two week follow up appointment, the skilled rehab facility will also need to assist the patient in arranging follow up appointment in our office and any transportation needs.  MAKE SURE YOU:  Understand these instructions.  Get help right away if you are not doing well or get worse.    Pick up stool softner and laxative for home use following surgery while on pain medications. Do not submerge incision under water. Please use good hand washing techniques while changing dressing each day. May shower starting three days after surgery. Please use a clean towel to pat the incision dry following showers. Continue to use ice for pain and swelling after surgery. Do not use any lotions or creams on the incision until instructed by your surgeon.   Information on my medicine - XARELTO (Rivaroxaban)  Why was Xarelto prescribed for you? Xarelto was prescribed for you to reduce the risk of blood  clots forming after orthopedic surgery. The medical term for these abnormal blood clots is venous thromboembolism (VTE).  What do you need to know about xarelto ? Take your Xarelto ONCE DAILY at the same time every day. You may take it either with or without food.  If you have difficulty swallowing the tablet whole, you may crush it and mix in applesauce just prior to taking your  dose.  Take Xarelto exactly as prescribed by your doctor and DO NOT stop taking Xarelto without talking to the doctor who prescribed the medication.  Stopping without other VTE prevention medication to take the place of Xarelto may increase your risk of developing a clot.  After discharge, you should have regular check-up appointments with your healthcare provider that is prescribing your Xarelto.    What do you do if you miss a dose? If you miss a dose, take it as soon as you remember on the same day then continue your regularly scheduled once daily regimen the next day. Do not take two doses of Xarelto on the same day.   Important Safety Information A possible side effect of Xarelto is bleeding. You should call your healthcare provider right away if you experience any of the following: Bleeding from an injury or your nose that does not stop. Unusual colored urine (red or dark brown) or unusual colored stools (red or black). Unusual bruising for unknown reasons. A serious fall or if you hit your head (even if there is no bleeding).  Some medicines may interact with Xarelto and might increase your risk of bleeding while on Xarelto. To help avoid this, consult your healthcare provider or pharmacist prior to using any new prescription or non-prescription medications, including herbals, vitamins, non-steroidal anti-inflammatory drugs (NSAIDs) and supplements.  This website has more information on Xarelto: [email or web address]   Increase activity slowly as tolerated    Complete by:  As directed      Allergies  as of 08/11/2016   No Known Allergies     Medication List    STOP taking these medications   aspirin EC 81 MG tablet   butalbital-acetaminophen-caffeine 50-325-40 MG tablet Commonly known as:  FIORICET   diclofenac 75 MG EC tablet Commonly known as:  VOLTAREN   Melatonin 10 MG Tabs   MIDOL COMPLETE 500-60-15 MG Tabs Generic drug:  Acetaminophen-Caff-Pyrilamine   PAMPRIN MULTI-SYMPTOM 500-25-15 MG Tabs Generic drug:  APAP-Pamabrom-Pyrilamine   TART CHERRY ADVANCED PO     TAKE these medications   ALPRAZolam 0.5 MG tablet Commonly known as:  XANAX Take 1 tablet (0.5 mg total) by mouth at bedtime as needed for anxiety. What changed:  when to take this   amLODipine 10 MG tablet Commonly known as:  NORVASC Take 1 tablet (10 mg total) by mouth daily. What changed:  when to take this   DULoxetine 60 MG capsule Commonly known as:  CYMBALTA Take 1 capsule (60 mg total) by mouth every morning.   estradiol 0.1 MG/24HR patch Commonly known as:  VIVELLE-DOT Place 0.5 patches onto the skin 2 (two) times a week. Sunday & Thursday   levothyroxine 50 MCG tablet Commonly known as:  SYNTHROID, LEVOTHROID Take 1 tablet (50 mcg total) by mouth daily. What changed:  when to take this   losartan 25 MG tablet Commonly known as:  COZAAR Take 1 tablet (25 mg total) by mouth daily. Take 1 tablet by mouth daily What changed:  when to take this  additional instructions   metFORMIN 500 MG tablet Commonly known as:  GLUCOPHAGE Take 1/2 tablet in the evening What changed:  how much to take  how to take this  when to take this  additional instructions   oxyCODONE 5 MG immediate release tablet Commonly known as:  Oxy IR/ROXICODONE Take 1-2 tablets (5-10 mg total) by mouth every 3 (three) hours as needed for breakthrough pain.   rivaroxaban 10 MG Tabs tablet Commonly  known as:  XARELTO Take 1 tablet (10 mg total) by mouth daily with breakfast.   rosuvastatin 5 MG  tablet Commonly known as:  CRESTOR Take 1 tablet (5 mg total) by mouth at bedtime. What changed:  when to take this  additional instructions   temazepam 15 MG capsule Commonly known as:  RESTORIL Take 1 or 2 at bedtime as needed for sleep What changed:  how much to take  how to take this  when to take this  reasons to take this  additional instructions   timolol 0.5 % ophthalmic solution Commonly known as:  BETIMOL Place 1 drop into both eyes 2 (two) times daily.   tiZANidine 4 MG tablet Commonly known as:  ZANAFLEX Take 1 tablet (4 mg total) by mouth every 8 (eight) hours as needed for muscle spasms.   Travoprost (BAK Free) 0.004 % Soln ophthalmic solution Commonly known as:  TRAVATAN Place 1 drop into both eyes at bedtime.      Follow-up Information    Gaynelle Arabian, MD. Schedule an appointment as soon as possible for a visit on 08/24/2016.   Specialty:  Orthopedic Surgery Why:  Call [phone] tomorrow to make the appointment Contact information: [address] [place] [address] [phone]           Signed: Ardeen Jourdain, PA-C Orthopaedic Surgery 08/12/2016, 1:34 PM

## 2016-08-17 ENCOUNTER — Telehealth: Payer: Self-pay

## 2016-08-26 ENCOUNTER — Ambulatory Visit (INDEPENDENT_AMBULATORY_CARE_PROVIDER_SITE_OTHER): Payer: 59 | Admitting: Family Medicine

## 2016-08-26 VITALS — BP 121/69 | HR 88 | Temp 97.7°F | Ht 62.0 in | Wt 118.4 lb

## 2016-08-26 DIAGNOSIS — D72829 Elevated white blood cell count, unspecified: Secondary | ICD-10-CM

## 2016-08-26 DIAGNOSIS — R197 Diarrhea, unspecified: Secondary | ICD-10-CM | POA: Diagnosis not present

## 2016-08-26 DIAGNOSIS — Z96652 Presence of left artificial knee joint: Secondary | ICD-10-CM

## 2016-08-26 DIAGNOSIS — I10 Essential (primary) hypertension: Secondary | ICD-10-CM

## 2016-08-26 DIAGNOSIS — E782 Mixed hyperlipidemia: Secondary | ICD-10-CM | POA: Diagnosis not present

## 2016-08-26 MED ORDER — DIPHENOXYLATE-ATROPINE 2.5-0.025 MG PO TABS: 1.0000 | ORAL_TABLET | Freq: Four times a day (QID) | ORAL | 1 refills | Status: DC | PRN

## 2016-08-26 NOTE — Patient Instructions (Signed)
It was good to see you again- glad that your operation was a success!   Let's recheck your blood count in about 2 months- can do as a lab visit only, I will order for you Otherwise let's visit in 6 months or so  BP Readings from Last 3 Encounters:  08/26/16 121/69  08/11/16 116/63  08/04/16 130/80

## 2016-08-26 NOTE — Progress Notes (Signed)
[facility] at [facility] [address], [address] [place], [place] [address] [phone] [phone]  Date:  08/26/2016   Name:  Kristy CivatteDiane C Arias   DOB:  01/31/1950   MRN:  [id number]  PCP:  Pearline Cablesopland, Jessica C, MD    Chief Complaint: Hospitalization Follow-up [facility] on 08/10/16 for knee surgery. Pt here for f/u visit. )   History of Present Illness:  Kristy Arias is a 66 y.o. very pleasant female patient who presents with the following:  Here today for a follow-up visit  Last seen here in February We tried going off her statin- her lipids did go up quite a bit however so we restarted her crestor She was admitted from 7/16 to 7/18 to have a total left knee per Dr. Lequita HaltAluisio.  Operation went quite well and she is making progress She is down to 5 oxycodone a day and is coming off her muscle relaxer.  Her pain does keep her awake some at night, and she has a hard time getting up to go to the bathroom at night sometimes No fever, heat or redness of her knee noted  She is doing PT and doing well with this- per [facility]  She is ok with continuing her crestor- she feels that this is the lesser of the 2 evils in this case   About a week after her operation she had about 3 episodes of explosive diarrhea - she used some lomotil and also went back on the creon that she had on hand and these sx have now resolved She would like to have some lomotil on hand to u  Lab Results  Component Value Date   HGBA1C 5.1 08/04/2016   She does NOT have diabetes, but tried metformin in hopes of preventing dementia- however she is not taking this right now, plans to start back soon She is taking xarelto for a total of 3 weeks to prevent a clot.  Will plan to go back on her vivelle dot in about a month Leukocytosis and anemia post- operatively- a bit soon to recheck this today.  Will have her come in for a lab visit in 2 months  Lab Results  Component Value Date   TSH 1.90 03/18/2016      Patient Active Problem List   Diagnosis Date Noted  . OA (osteoarthritis) of knee 08/25/2015  . Memory loss 06/05/2014  . Chronic migraine without aura without status migrainosus, not intractable 06/05/2014  . Worsening headaches 06/05/2014  . Chronic cholecystitis with calculus s/p lap chole 06/29/2012 04/13/2012  . Abdominal pain, epigastric 04/13/2012  . Heartburn 04/13/2012  . Hypertension 11/02/2011  . Fibromuscular dysplasia (HCC) 11/02/2011  . Hyperlipidemia 11/02/2011  . Allergic rhinitis 11/02/2011  . Arthritis 11/02/2011  . Depression 11/02/2011    Past Medical History:  Diagnosis Date  . Anemia   . Anxiety   . Arthritis   . Depression   . GERD (gastroesophageal reflux disease)   . Glaucoma   . Headache   . Heart murmur   . Hyperlipidemia   . Hypertension   . Hypothyroidism   . Shortness of breath    syndrome x  . Syndrome X, cardiac [facility](HCC)     Past Surgical History:  Procedure Laterality Date  . ABDOMINAL HYSTERECTOMY    . BREAST ENHANCEMENT SURGERY    . BREAST SURGERY    . CARDIAC CATHETERIZATION  4/14  . CHOLECYSTECTOMY    . COSMETIC SURGERY    .  DILATION AND CURETTAGE OF UTERUS  2013  . LAPAROSCOPIC CHOLECYSTECTOMY SINGLE PORT N/A 06/29/2012   Procedure: LAPAROSCOPIC CHOLECYSTECTOMY SINGLE PORT IOC ;  Surgeon: Ardeth SportsmanSteven C. Gross, MD;  Location: [facility];  Service: General;  Laterality: N/A;  . LEFT HEART CATHETERIZATION WITH CORONARY ANGIOGRAM N/A 05/23/2012   Procedure: LEFT HEART CATHETERIZATION WITH CORONARY ANGIOGRAM;  Surgeon: Pamella PertJagadeesh R Ganji, MD;  [facility];  Service: Cardiovascular;  Laterality: N/A;  . TOTAL KNEE ARTHROPLASTY Right 08/25/2015   Procedure: RIGHT TOTAL KNEE ARTHROPLASTY;  Surgeon: Ollen GrossFrank Aluisio, MD;  Location: [facility];  Service: Orthopedics;  Laterality: Right;  . TOTAL KNEE ARTHROPLASTY Left 08/09/2016   Procedure: LEFT TOTAL KNEE ARTHROPLASTY;  Surgeon: Ollen GrossAluisio, Frank, MD;  Location: [facility];  Service: Orthopedics;   Laterality: Left;    Social History  Substance Use Topics  . Smoking status: Never Smoker  . Smokeless tobacco: Never Used  . Alcohol use No    Family History  Problem Relation Age of Onset  . Hypertension Father   . Diabetes Father   . Heart disease Father   . Hyperlipidemia Father   . Hypertension Maternal Grandmother   . Heart disease Maternal Grandmother   . Diabetes Maternal Grandfather   . Hyperlipidemia Maternal Grandfather   . Heart disease Maternal Grandfather   . Osteoporosis Paternal Grandmother   . Heart disease Paternal Grandmother   . Heart disease Paternal Grandfather   . Cancer Mother   . Hyperlipidemia Mother   . Hypertension Mother   . Cancer Sister     No Known Allergies  Medication list has been reviewed and updated.  Current Outpatient Prescriptions on File Prior to Visit  Medication Sig Dispense Refill  . ALPRAZolam (XANAX) 0.5 MG tablet Take 1 tablet (0.5 mg total) by mouth at bedtime as needed for anxiety. (Patient taking differently: Take 0.5 mg by mouth 3 (three) times daily as needed for anxiety. ) 30 tablet 2  . amLODipine (NORVASC) 10 MG tablet Take 1 tablet (10 mg total) by mouth daily. (Patient taking differently: Take 10 mg by mouth at bedtime. ) 90 tablet 3  . DULoxetine (CYMBALTA) 60 MG capsule Take 1 capsule (60 mg total) by mouth every morning. 90 capsule 3  . estradiol (VIVELLE-DOT) 0.1 MG/24HR patch Place 0.5 patches onto the skin 2 (two) times a week. Sunday & Thursday    . levothyroxine (SYNTHROID, LEVOTHROID) 50 MCG tablet Take 1 tablet (50 mcg total) by mouth daily. (Patient taking differently: Take 50 mcg by mouth daily before breakfast. ) 90 tablet 3  . losartan (COZAAR) 25 MG tablet Take 1 tablet (25 mg total) by mouth daily. Take 1 tablet by mouth daily (Patient taking differently: Take 25 mg by mouth at bedtime. ) 90 tablet 3  . metFORMIN (GLUCOPHAGE) 500 MG tablet Take 1/2 tablet in the evening (Patient taking differently: Take  250 mg by mouth at bedtime. ) 30 tablet 5  . oxyCODONE (OXY IR/ROXICODONE) 5 MG immediate release tablet Take 1-2 tablets (5-10 mg total) by mouth every 3 (three) hours as needed for breakthrough pain. 60 tablet 0  . rivaroxaban (XARELTO) 10 MG TABS tablet Take 1 tablet (10 mg total) by mouth daily with breakfast. 20 tablet 0  . rosuvastatin (CRESTOR) 5 MG tablet Take 1 tablet (5 mg total) by mouth at bedtime. (Patient taking differently: Take 5 mg by mouth 4 (four) times a week. At bedtime.) 30 tablet 11  . temazepam (RESTORIL) 15 MG capsule Take 1 or  2 at bedtime as needed for sleep (Patient taking differently: Take 15-30 mg by mouth at bedtime as needed (for sleep.). ) 60 capsule 2  . timolol (BETIMOL) 0.5 % ophthalmic solution Place 1 drop into both eyes 2 (two) times daily.    Marland Kitchen tiZANidine (ZANAFLEX) 4 MG tablet Take 1 tablet (4 mg total) by mouth every 8 (eight) hours as needed for muscle spasms. 60 tablet 1  . Travoprost, BAK Free, (TRAVATAN) 0.004 % SOLN ophthalmic solution Place 1 drop into both eyes at bedtime.      No current facility-administered medications on file prior to visit.     Review of Systems:  As per HPI- otherwise negative. She is able to walk some steps  She is not driving yet since she is still on oxycodone No fever or chills Appetite is back to normal    Physical Examination: Vitals:   08/26/16 1343  BP: 121/69  Pulse: 88  Temp: 97.7 F (36.5 C)   Vitals:   08/26/16 1343  Weight: 118 lb 6.4 oz (53.7 kg)  Height: 5\' 2"  (1.575 m)   Body mass index is 21.66 kg/m. Ideal Body Weight: Weight in (lb) to have BMI = 25: 136.4  GEN: WDWN, NAD, Non-toxic, A & O x 3, looks well, normal weight HEENT: Atraumatic, Normocephalic. Neck supple. No masses, No LAD. Ears and Nose: No external deformity. CV: RRR, No M/G/R. No JVD. No thrill. No extra heart sounds. PULM: CTA B, no wheezes, crackles, rhonchi. No retractions. No resp. distress. No accessory muscle  use. EXTR: No c/c/e NEURO favoring left and using a cane  PSYCH: Normally interactive. Conversant. Not depressed or anxious appearing.  Calm demeanor.  Left knee is s/p total knee - healing well, no unexpected swelling, no redness or excess heat  She has steri strips in place. She is using a cane   Assessment and Plan: Diarrhea in adult patient - Plan: diphenoxylate-atropine (LOMOTIL) 2.5-0.025 MG tablet  Mixed hyperlipidemia  Essential hypertension  Status post left knee replacement  Leukocytosis, unspecified type - Plan: CBC  Here today for a follow-up visit  BP well controlled She is back on her statin Refilled lomotil to use if needed  Let's recheck your blood count in about 2 months- can do as a lab visit only, I will order for you Otherwise let's visit in 6 months or so  Signed Abbe Amsterdam, MD

## 2016-10-08 ENCOUNTER — Other Ambulatory Visit: Payer: Self-pay | Admitting: Emergency Medicine

## 2016-10-08 NOTE — Telephone Encounter (Signed)
NCCSR: pt is getting alprazolam from Dr.Neal. Cannot also refill her temazepam   Called pt and LMOM- cannot refill this.  Please let me know if I can help further

## 2016-10-08 NOTE — Telephone Encounter (Signed)
Received refill request for temazepam (RESTORIL) 15 MG capsule.  Last refill:03/24/16 and Last office visit: 08/26/16

## 2016-10-13 ENCOUNTER — Other Ambulatory Visit: Payer: Self-pay | Admitting: Emergency Medicine

## 2016-10-13 NOTE — Telephone Encounter (Signed)
Requesting: temazepam (RESTOIL) 15 MG capsule Contract UDS Last OV: 08/26/16 Last Refill : 03/24/16  Please Advise

## 2016-10-13 NOTE — Telephone Encounter (Signed)
I called pt last week and LMOM- I cannot refill this as she is getting xanax from Dr. Jennette Kettle as well, filled a 30 day supply about 3 weeks ago

## 2016-10-14 ENCOUNTER — Other Ambulatory Visit: Payer: Self-pay | Admitting: Family Medicine

## 2016-10-14 DIAGNOSIS — M159 Polyosteoarthritis, unspecified: Secondary | ICD-10-CM

## 2016-10-14 DIAGNOSIS — M150 Primary generalized (osteo)arthritis: Principal | ICD-10-CM

## 2016-10-14 DIAGNOSIS — R413 Other amnesia: Secondary | ICD-10-CM

## 2016-10-15 NOTE — Telephone Encounter (Signed)
Gave her a call- she is NO longer taking xarelto.   She is taking voltaren 75 BID for joint pains.  Will refill for her Also she is interested in seeing a neurologist as she has some concerns about her memory.  Her mom sees Dr. Quentin Mulling with [facility]- will make this referral for her

## 2016-10-29 ENCOUNTER — Other Ambulatory Visit (INDEPENDENT_AMBULATORY_CARE_PROVIDER_SITE_OTHER): Payer: 59

## 2016-10-29 DIAGNOSIS — D72829 Elevated white blood cell count, unspecified: Secondary | ICD-10-CM

## 2016-10-29 LAB — CBC
HEMATOCRIT: 40.7 % (ref 36.0–46.0)
HEMOGLOBIN: 13.3 g/dL (ref 12.0–15.0)
MCHC: 32.6 g/dL (ref 30.0–36.0)
MCV: 94.6 fl (ref 78.0–100.0)
PLATELETS: 278 10*3/uL (ref 150.0–400.0)
RBC: 4.3 Mil/uL (ref 3.87–5.11)
RDW: 16.2 % — ABNORMAL HIGH (ref 11.5–15.5)
WBC: 4.8 10*3/uL (ref 4.0–10.5)

## 2016-10-30 ENCOUNTER — Encounter: Payer: Self-pay | Admitting: Family Medicine

## 2016-11-11 ENCOUNTER — Telehealth: Payer: Self-pay | Admitting: Family Medicine

## 2016-11-11 NOTE — Telephone Encounter (Signed)
Pt called in to be advised. She said that she had labs done but noticed that certain labs were not done. She would like to know why the certain (basic) labs were completed and not the others.    She would like to speak with assistant to discuss further.   CB: [phone]

## 2016-11-11 NOTE — Telephone Encounter (Signed)
Called her back- LMOM. We were just checking her CBC as it was abnormal in July, not a full work-up .  If other concerns please let me know

## 2016-12-01 ENCOUNTER — Encounter: Payer: Self-pay | Admitting: Family Medicine

## 2016-12-01 DIAGNOSIS — R079 Chest pain, unspecified: Secondary | ICD-10-CM | POA: Insufficient documentation

## 2017-02-08 NOTE — Progress Notes (Signed)
[facility] at [facility] [address], [address] [place], [place] [address] [phone]  Date:  02/09/2017   Name:  Kristy Arias   DOB:  06-08-1950   MRN:  [id number]  PCP:  Pearline Cables, MD    Chief Complaint: Depression   History of Present Illness:  Kristy Arias is a 67 y.o. very pleasant female patient who presents with the following:  Here today to discuss depression - she has struggled with this in the past but we have not discussed it during our last few visits that I can see She is on cymbalta  Pap:-per her OBG Mammo:  Per her OBG Flu:  done  Last seen here is August  She notes that she saw her OBG in December and admitted to feeling sad- they did not really go into it however Her mother has dementia, has been ill, fell and dislocated her shoulder.  Trying to take care of her parents is really wearing her out  Over this past summer her insurance coverage changed and she had to come off brand cymbalta and change to the generic- she does not feel like this works as well for her as the brand did  She has been on her cymbalta for several years- she is not sure exactly how long but at least 2 years.  She admits to feeling afraid that she will get dementia as well.  She is actually seeing neurology the end of this month to discuss her concerns   She feels like she may not have as much patience as she needs- she deals with her 48 yo grand-daughter and her own mother  Her daughter and her husband have suggested that she get counseling.   She is thinking of starting to see someone and alreay has a Veterinary surgeon in mind who she can see  She is married to Kristy Arias She did suffer from some suicidal thoughts a few months ago, which scared her.  These have resolved, and she does not have a plan for self harm. Denies any current SI She takes 1mg  of xanax total at bedtime and she is sleeping ok  She is eating ok as well  BP Readings from Last  3 Encounters:  02/09/17 (!) 159/72  08/26/16 121/69  08/11/16 116/63   We are visiting on 1/30 for a CPE and will be able to follow-up, do labs then    Patient Active Problem List   Diagnosis Date Noted  . Chest pain 12/01/2016  . OA (osteoarthritis) of knee 08/25/2015  . Memory loss 06/05/2014  . Chronic migraine without aura without status migrainosus, not intractable 06/05/2014  . Worsening headaches 06/05/2014  . Chronic cholecystitis with calculus s/p lap chole 06/29/2012 04/13/2012  . Abdominal pain, epigastric 04/13/2012  . Heartburn 04/13/2012  . Hypertension 11/02/2011  . Fibromuscular dysplasia (HCC) 11/02/2011  . Hyperlipidemia 11/02/2011  . Allergic rhinitis 11/02/2011  . Arthritis 11/02/2011  . Depression 11/02/2011    Past Medical History:  Diagnosis Date  . Anemia   . Anxiety   . Arthritis   . Depression   . GERD (gastroesophageal reflux disease)   . Glaucoma   . Headache   . Heart murmur   . Hyperlipidemia   . Hypertension   . Hypothyroidism   . Shortness of breath    syndrome x  . Syndrome X, cardiac [facility])     Past Surgical History:  Procedure Laterality Date  .  ABDOMINAL HYSTERECTOMY    . BREAST ENHANCEMENT SURGERY    . BREAST SURGERY    . CARDIAC CATHETERIZATION  4/14  . CHOLECYSTECTOMY    . COSMETIC SURGERY    . DILATION AND CURETTAGE OF UTERUS  2013  . LAPAROSCOPIC CHOLECYSTECTOMY SINGLE PORT N/A 06/29/2012   Procedure: LAPAROSCOPIC CHOLECYSTECTOMY SINGLE PORT IOC ;  Surgeon: Ardeth Sportsman, MD;  Location: [facility];  Service: General;  Laterality: N/A;  . LEFT HEART CATHETERIZATION WITH CORONARY ANGIOGRAM N/A 05/23/2012   Procedure: LEFT HEART CATHETERIZATION WITH CORONARY ANGIOGRAM;  Surgeon: [name], MD;  Location: [facility] CATH LAB;  Service: Cardiovascular;  Laterality: N/A;  . TOTAL KNEE ARTHROPLASTY Right 08/25/2015   Procedure: RIGHT TOTAL KNEE ARTHROPLASTY;  Surgeon: Ollen Gross, MD;  Location: [facility];  Service: Orthopedics;   Laterality: Right;  . TOTAL KNEE ARTHROPLASTY Left 08/09/2016   Procedure: LEFT TOTAL KNEE ARTHROPLASTY;  Surgeon: Ollen Gross, MD;  Location: [facility];  Service: Orthopedics;  Laterality: Left;    Social History   Tobacco Use  . Smoking status: Never Smoker  . Smokeless tobacco: Never Used  Substance Use Topics  . Alcohol use: No  . Drug use: No    Family History  Problem Relation Age of Onset  . Hypertension Father   . Diabetes Father   . Heart disease Father   . Hyperlipidemia Father   . Hypertension Maternal Grandmother   . Heart disease Maternal Grandmother   . Diabetes Maternal Grandfather   . Hyperlipidemia Maternal Grandfather   . Heart disease Maternal Grandfather   . Osteoporosis Paternal Grandmother   . Heart disease Paternal Grandmother   . Heart disease Paternal Grandfather   . Cancer Mother   . Hyperlipidemia Mother   . Hypertension Mother   . Cancer Sister     No Known Allergies  Medication list has been reviewed and updated.  Current Outpatient Medications on File Prior to Visit  Medication Sig Dispense Refill  . ALPRAZolam (XANAX) 0.5 MG tablet Take 1 tablet (0.5 mg total) by mouth at bedtime as needed for anxiety. (Patient taking differently: Take 0.5 mg by mouth 3 (three) times daily as needed for anxiety. ) 30 tablet 2  . amLODipine (NORVASC) 10 MG tablet Take 1 tablet (10 mg total) by mouth daily. (Patient taking differently: Take 10 mg by mouth at bedtime. ) 90 tablet 3  . diclofenac (VOLTAREN) 75 MG EC tablet TAKE 1 TABLET BY MOUTH 2 TIMES DAILY AS NEEDED FOR PAIN 180 tablet 3  . diphenoxylate-atropine (LOMOTIL) 2.5-0.025 MG tablet Take 1 tablet by mouth 4 (four) times daily as needed for diarrhea or loose stools. 30 tablet 1  . DULoxetine (CYMBALTA) 60 MG capsule Take 1 capsule (60 mg total) by mouth every morning. 90 capsule 3  . estradiol (VIVELLE-DOT) 0.1 MG/24HR patch Place 0.5 patches onto the skin 2 (two) times a week. Sunday & Thursday     . levothyroxine (SYNTHROID, LEVOTHROID) 50 MCG tablet Take 1 tablet (50 mcg total) by mouth daily. (Patient taking differently: Take 50 mcg by mouth daily before breakfast. ) 90 tablet 3  . losartan (COZAAR) 25 MG tablet Take 1 tablet (25 mg total) by mouth daily. Take 1 tablet by mouth daily (Patient taking differently: Take 25 mg by mouth at bedtime. ) 90 tablet 3  . metFORMIN (GLUCOPHAGE) 500 MG tablet Take 1/2 tablet in the evening (Patient taking differently: Take 250 mg by mouth at bedtime. ) 30 tablet 5  . oxyCODONE (  OXY IR/ROXICODONE) 5 MG immediate release tablet Take 1-2 tablets (5-10 mg total) by mouth every 3 (three) hours as needed for breakthrough pain. 60 tablet 0  . rosuvastatin (CRESTOR) 5 MG tablet Take 1 tablet (5 mg total) by mouth at bedtime. (Patient taking differently: Take 5 mg by mouth 4 (four) times a week. At bedtime.) 30 tablet 11  . temazepam (RESTORIL) 15 MG capsule Take 1 or 2 at bedtime as needed for sleep (Patient taking differently: Take 15-30 mg by mouth at bedtime as needed (for sleep.). ) 60 capsule 2  . timolol (BETIMOL) 0.5 % ophthalmic solution Place 1 drop into both eyes 2 (two) times daily.    Marland Kitchen. tiZANidine (ZANAFLEX) 4 MG tablet Take 1 tablet (4 mg total) by mouth every 8 (eight) hours as needed for muscle spasms. 60 tablet 1  . Travoprost, BAK Free, (TRAVATAN) 0.004 % SOLN ophthalmic solution Place 1 drop into both eyes at bedtime.      No current facility-administered medications on file prior to visit.     Review of Systems:  As per HPI- otherwise negative. No fever or chills    Physical Examination: Vitals:   02/09/17 1223  BP: (!) 159/72  Pulse: 75  Resp: 16  Temp: 97.9 F (36.6 C)  SpO2: 99%   Vitals:   02/09/17 1223  Weight: 118 lb 12.8 oz (53.9 kg)  Height: 5\' 2"  (1.575 m)   Body mass index is 21.73 kg/m. Ideal Body Weight: Weight in (lb) to have BMI = 25: 136.4  GEN: WDWN, NAD, Non-toxic, A & O x 3, slim build, looks well   HEENT: Atraumatic, Normocephalic. Neck supple. No masses, No LAD. Ears and Nose: No external deformity. CV: RRR, No M/G/R. No JVD. No thrill. No extra heart sounds. PULM: CTA B, no wheezes, crackles, rhonchi. No retractions. No resp. distress. No accessory muscle use. ABD: S, NT, ND, +BS. No rebound. No HSM. EXTR: No c/c/e NEURO Normal gait.  PSYCH: Normally interactive. Conversant. Not depressed or anxious appearing.  Calm demeanor.    Assessment and Plan: Adjustment disorder with depressed mood  Here today with depression, difficulty adjusting to increased family stressors, taking care of her ill mother and also keeping her granddaughter a good bit She would like to try brand cymbalta before we change to another medication.  I wrote this for her, she will see about insurance coverage If any SI she agrees to seek help right away   Signed Abbe AmsterdamJessica Jream Broyles, MD

## 2017-02-09 ENCOUNTER — Ambulatory Visit: Payer: 59 | Admitting: Family Medicine

## 2017-02-09 ENCOUNTER — Encounter: Payer: Self-pay | Admitting: Family Medicine

## 2017-02-09 VITALS — BP 159/72 | HR 75 | Temp 97.9°F | Resp 16 | Ht 62.0 in | Wt 118.8 lb

## 2017-02-09 DIAGNOSIS — F4321 Adjustment disorder with depressed mood: Secondary | ICD-10-CM | POA: Diagnosis not present

## 2017-02-09 MED ORDER — CYMBALTA 60 MG PO CPEP: 60.0000 mg | ORAL_CAPSULE | Freq: Every day | ORAL | 3 refills | Status: DC

## 2017-02-09 NOTE — Patient Instructions (Addendum)
Please let me know what the neurologist says We will try and get you brand cymalta - I will see you in a couple of weeks so we can see how this is working for you As we discussed, yoga may be a good exercise option for you that your knee will tolerate If you are not ok- in any danger of self- harm- please call 911 or otherwise seek help immediately

## 2017-02-10 ENCOUNTER — Telehealth: Payer: Self-pay | Admitting: Family Medicine

## 2017-02-10 ENCOUNTER — Telehealth: Payer: Self-pay

## 2017-02-10 MED ORDER — CYMBALTA 60 MG PO CPEP: 60.0000 mg | ORAL_CAPSULE | Freq: Every day | ORAL | 3 refills | Status: DC

## 2017-02-10 NOTE — Telephone Encounter (Signed)
Refill sent to pharmacy per pt request.

## 2017-02-10 NOTE — Telephone Encounter (Signed)
PA denied. This is a plan exclusion. Preferred alternatives not listed.

## 2017-02-10 NOTE — Telephone Encounter (Signed)
Copied from CRM [id number]. Topic: Quick Communication - See Telephone Encounter >> Feb 10, 2017 11:02 AM Eston Mouldavis, Brier Reid B wrote: CRM for notification. See Telephone encounter for:  Pt is asking for a rx for CYMBALTA 60 MG capsule  (not the generic) be [facility] to [facility] [place], [place] - [address] [phone] (Phone) [phone] (Fax)   02/10/17.

## 2017-02-10 NOTE — Telephone Encounter (Signed)
PA initiated via Covermymeds; KEY: [id number]. Awaiting determination.

## 2017-02-10 NOTE — Telephone Encounter (Signed)
Copied from CRM [phone]#[id number]. Topic: General - Other >> Feb 10, 2017  2:55 PM Stephannie LiSimmons, Janett L, NT wrote: Reason for CRM: Patient called and said her cymbalto will not go thru until 02/16/17   because it is too early, she wants to know if Dr Patsy Lageropland wil start the process  with [facility] for approval of brand name only please advise  [phone]

## 2017-02-11 MED ORDER — DULOXETINE HCL 20 MG PO CPEP: ORAL_CAPSULE | ORAL | 0 refills | Status: DC

## 2017-02-11 MED ORDER — VENLAFAXINE HCL ER 75 MG PO CP24: 75.0000 mg | ORAL_CAPSULE | Freq: Every day | ORAL | 5 refills | Status: DC

## 2017-02-11 NOTE — Telephone Encounter (Signed)
Called her- brand cymbalta is not covered  She tried paxil years ago, does not really remember how this worked for her She also used wellbutrin at some point in the past with success She would like to change to a different med as brand cymbalta is too expensive.  Plan to have her taper off the cymbalta and then go on effexor Will rx cymbalta 20 mg- take 2 for 10 days, then 1 for 10 days, then start effexor xr

## 2017-02-17 ENCOUNTER — Telehealth: Payer: Self-pay | Admitting: Family Medicine

## 2017-02-17 NOTE — Telephone Encounter (Signed)
Copied from CRM [id number]. Topic: Quick Communication - See Telephone Encounter >> Feb 17, 2017 11:03 AM Oneal GroutSebastian, Jennifer S wrote: CRM for notification. See Telephone encounter for: patient is requesting to speak with Dr Patsy Lageropland regarding DULoxetine (CYMBALTA) 20 MG capsule, saw Dr Quentin MullingHayworth and recommending going to a high dose of cymbalta instead of trying effexor. [facility] on [address]. Please advise  02/17/17.

## 2017-02-18 MED ORDER — DULOXETINE HCL 30 MG PO CPEP: 90.0000 mg | ORAL_CAPSULE | Freq: Every day | ORAL | 6 refills | Status: DC

## 2017-02-18 NOTE — Telephone Encounter (Signed)
Called her back- she would like to try going on a higher dose of cymbalta- ?90 mg to see if this might be helpful, prior to changing to a new drug. She was on 60 mg in the past,.  This is a reasonable idea, I will call this in for her She is seeing me in a week for her CPE, can follow-up then

## 2017-02-22 NOTE — Progress Notes (Addendum)
Kristy Arias at [facility] [address], [address] [place], [place] [address] [phone] [id number]  Date:  02/23/2017   Name:  Kristy Arias   DOB:  1950/11/29   MRN:  [id number]  PCP:  Pearline Cables, MD    Chief Complaint: Annual Exam   History of Present Illness:  Kristy Arias is a 67 y.o. very pleasant female patient who presents with the following:  Here today for a CPE- we visited earlier this month as well, we tried going up to 90 mg/d of cymbalta She notes that she saw her OBG in December and admitted to feeling sad- they did not really go into it however Her mother has dementia, has been ill, fell and dislocated her shoulder.  Trying to take care of her parents is really wearing her out  Over this past summer her insurance coverage changed and she had to come off brand cymbalta and change to the generic- she does not feel like this works as well for her as the brand did She has been on her cymbalta for several years- she is not sure exactly how long but at least 2 years.  She admits to feeling afraid that she will get dementia as well.  She is actually seeing neurology the end of this month to discuss her concerns  She feels like she may not have as much patience as she needs- she deals with her 45 yo grand-daughter and her own mother  Her daughter and her husband have suggested that she get counseling.   She is thinking of starting to see someone and alreay has a Veterinary surgeon in mind who she can see She is married to [place] She did suffer from some suicidal thoughts a few months ago, which scared her.  These have resolved, and she does not have a plan for self harm. Denies any current SI She takes 1mg  of xanax total at bedtime and she is sleeping ok  She is eating ok as well  She went to the higher dose of cymbalta about 4 days ago, and it does seem to be helping her  She is fasting today  Pap: 2018Jennette Kettle Mammo: scheduled for March Colon:  2010, was ok.   Dexa: she thinks this was done a couple of years ago Labs: some are due today Flu: done  She did have her neurology consultation with Dr. Quentin Mulling last week- he felt that she was ok! No sign of dementia. She is still a bit nervous about this and is thinking of getting further neuropsych testing but has not decided about this yet   Lab Results  Component Value Date   TSH 2.56 02/23/2017   She does chronic SOB but this is longstanding and no different for her It may get worse if she is too busy or stressed No chest pain She is still taking crestor but has plenty of samples of this  She stopped her temazepam as I requested-  She is using xanax at bedtime- if she does not use it she cannot sleep She really does not use this during the day except on very rare occasion She does need a refill fioricet for her headaches   She does use creon on occasion prior to a fatty meal - this prevents GI symptoms for her. She does not need this refilled yet but just wanted to mention it   Needs a CMP, tsh today, lipids today Patient Active Problem  List   Diagnosis Date Noted  . Chest pain 12/01/2016  . OA (osteoarthritis) of knee 08/25/2015  . Memory loss 06/05/2014  . Chronic migraine without aura without status migrainosus, not intractable 06/05/2014  . Chronic cholecystitis with calculus s/p lap chole 06/29/2012 04/13/2012  . Heartburn 04/13/2012  . Hypertension 11/02/2011  . Fibromuscular dysplasia (HCC) 11/02/2011  . Hyperlipidemia 11/02/2011  . Allergic rhinitis 11/02/2011  . Arthritis 11/02/2011  . Depression 11/02/2011    Past Medical History:  Diagnosis Date  . Anemia   . Anxiety   . Arthritis   . Depression   . GERD (gastroesophageal reflux disease)   . Glaucoma   . Headache   . Heart murmur   . Hyperlipidemia   . Hypertension   . Hypothyroidism   . Shortness of breath    syndrome x  . Syndrome X, cardiac [facility])     Past Surgical History:  Procedure  Laterality Date  . ABDOMINAL HYSTERECTOMY    . BREAST ENHANCEMENT SURGERY    . BREAST SURGERY    . CARDIAC CATHETERIZATION  4/14  . CHOLECYSTECTOMY    . COSMETIC SURGERY    . DILATION AND CURETTAGE OF UTERUS  2013  . EYE SURGERY    . LAPAROSCOPIC CHOLECYSTECTOMY SINGLE PORT N/A 06/29/2012   Procedure: LAPAROSCOPIC CHOLECYSTECTOMY SINGLE PORT IOC ;  Surgeon: Ardeth Sportsman, MD;  Location: [facility];  Service: General;  Laterality: N/A;  . LEFT HEART CATHETERIZATION WITH CORONARY ANGIOGRAM N/A 05/23/2012   Procedure: LEFT HEART CATHETERIZATION WITH CORONARY ANGIOGRAM;  Surgeon: [name], MD;  Location: [facility] CATH LAB;  Service: Cardiovascular;  Laterality: N/A;  . TOTAL KNEE ARTHROPLASTY Right 08/25/2015   Procedure: RIGHT TOTAL KNEE ARTHROPLASTY;  Surgeon: Ollen Gross, MD;  Location: [facility];  Service: Orthopedics;  Laterality: Right;  . TOTAL KNEE ARTHROPLASTY Left 08/09/2016   Procedure: LEFT TOTAL KNEE ARTHROPLASTY;  Surgeon: Ollen Gross, MD;  Location: [facility];  Service: Orthopedics;  Laterality: Left;    Social History   Tobacco Use  . Smoking status: Never Smoker  . Smokeless tobacco: Never Used  Substance Use Topics  . Alcohol use: No  . Drug use: No    Family History  Problem Relation Age of Onset  . Hypertension Father   . Diabetes Father   . Heart disease Father   . Hyperlipidemia Father   . Hypertension Maternal Grandmother   . Heart disease Maternal Grandmother   . Diabetes Maternal Grandfather   . Hyperlipidemia Maternal Grandfather   . Heart disease Maternal Grandfather   . Osteoporosis Paternal Grandmother   . Heart disease Paternal Grandmother   . Heart disease Paternal Grandfather   . Cancer Mother   . Hyperlipidemia Mother   . Hypertension Mother   . Cancer Sister     No Known Allergies  Medication list has been reviewed and updated.  Current Outpatient Medications on File Prior to Visit  Medication Sig Dispense Refill  . ALPRAZolam (XANAX)  0.5 MG tablet Take 1 tablet (0.5 mg total) by mouth at bedtime as needed for anxiety. (Patient taking differently: Take 0.5 mg by mouth 3 (three) times daily as needed for anxiety. ) 30 tablet 2  . diclofenac (VOLTAREN) 75 MG EC tablet TAKE 1 TABLET BY MOUTH 2 TIMES DAILY AS NEEDED FOR PAIN 180 tablet 3  . diphenoxylate-atropine (LOMOTIL) 2.5-0.025 MG tablet Take 1 tablet by mouth 4 (four) times daily as needed for diarrhea or loose stools. 30 tablet 1  .  DULoxetine (CYMBALTA) 30 MG capsule Take 3 capsules (90 mg total) by mouth daily. 90 capsule 6  . estradiol (VIVELLE-DOT) 0.1 MG/24HR patch Place 0.5 patches onto the skin 2 (two) times a week. Sunday & Thursday    . rosuvastatin (CRESTOR) 5 MG tablet Take 1 tablet (5 mg total) by mouth at bedtime. (Patient taking differently: Take 5 mg by mouth 4 (four) times a week. At bedtime.) 30 tablet 11   No current facility-administered medications on file prior to visit.     Review of Systems: No fever or chills No CP No rash  As per HPI- otherwise negative.   Physical Examination: Vitals:   02/23/17 1045  BP: 126/86  Pulse: 94  Resp: 16  Temp: (!) 97.3 F (36.3 C)  SpO2: 97%   Vitals:   02/23/17 1045  Weight: 116 lb 3.2 oz (52.7 kg)  Height: 5\' 2"  (1.575 m)   Body mass index is 21.25 kg/m. Ideal Body Weight: Weight in (lb) to have BMI = 25: 136.4  GEN: WDWN, NAD, Non-toxic, A & O x 3, slim build, looks well  HEENT: Atraumatic, Normocephalic. Neck supple. No masses, No LAD.  Bilateral TM wnl, oropharynx normal.  PEERL,EOMI.   Ears and Nose: No external deformity. CV: RRR, No M/G/R. No JVD. No thrill. No extra heart sounds. PULM: CTA B, no wheezes, crackles, rhonchi. No retractions. No resp. distress. No accessory muscle use. ABD: S, NT, ND EXTR: No c/c/e NEURO Normal gait.  PSYCH: Normally interactive. Conversant. Not depressed or anxious appearing.  Calm demeanor.    Assessment and Plan: Physical exam  Adjustment disorder  with depressed mood  Mixed hyperlipidemia - Plan: Lipid panel  Essential hypertension - Plan: amLODipine (NORVASC) 10 MG tablet, losartan (COZAAR) 25 MG tablet, Comprehensive metabolic panel  Acquired hypothyroidism - Plan: levothyroxine (SYNTHROID, LEVOTHROID) 50 MCG tablet, TSH  Caregiver stress  Here today for a CPE She is doing well on higher dose of cymbalta so far- continue this  Refilled her BP meds Refilled thyroid med Discussed her concerns about memory loss- so far all seems to be well, but she can certainly have further testing if this would help her feel more comfortable She continues to be under stress with caring for her aging mother   Signed Abbe Amsterdam, MD  Received her labs- letter to pt  Your labs look great- thyroid is normal, cholesterol is very good, metabolic profile is fine!  Please keep me posted as to how you are doing, and let's plan to visit in 4-6 months.   Results for orders placed or performed in visit on 02/23/17  Comprehensive metabolic panel  Result Value Ref Range   Sodium 139 135 - 145 mEq/L   Potassium 3.6 3.5 - 5.1 mEq/L   Chloride 104 96 - 112 mEq/L   CO2 26 19 - 32 mEq/L   Glucose, Bld 102 (H) 70 - 99 mg/dL   BUN 10 6 - 23 mg/dL   Creatinine, Ser 4.09 0.40 - 1.20 mg/dL   Total Bilirubin 0.4 0.2 - 1.2 mg/dL   Alkaline Phosphatase 82 39 - 117 U/L   AST 10 0 - 37 U/L   ALT 8 0 - 35 U/L   Total Protein 6.6 6.0 - 8.3 g/dL   Albumin 3.8 3.5 - 5.2 g/dL   Calcium 8.9 8.4 - 81.1 mg/dL   GFR 91.47 >82.95 mL/min  Lipid panel  Result Value Ref Range   Cholesterol 169 0 - 200 mg/dL  Triglycerides 113.0 0.0 - 149.0 mg/dL   HDL 16.1070.70 >96.04>39.00 mg/dL   VLDL 54.022.6 0.0 - 98.140.0 mg/dL   LDL Cholesterol 75 0 - 99 mg/dL   Total CHOL/HDL Ratio 2    NonHDL 97.86   TSH  Result Value Ref Range   TSH 2.56 0.35 - 4.50 uIU/mL

## 2017-02-23 ENCOUNTER — Encounter: Payer: 59 | Admitting: Family Medicine

## 2017-02-23 ENCOUNTER — Ambulatory Visit (INDEPENDENT_AMBULATORY_CARE_PROVIDER_SITE_OTHER): Payer: 59 | Admitting: Family Medicine

## 2017-02-23 ENCOUNTER — Encounter: Payer: Self-pay | Admitting: Family Medicine

## 2017-02-23 VITALS — BP 126/86 | HR 94 | Temp 97.3°F | Resp 16 | Ht 62.0 in | Wt 116.2 lb

## 2017-02-23 DIAGNOSIS — Z0000 Encounter for general adult medical examination without abnormal findings: Secondary | ICD-10-CM | POA: Diagnosis not present

## 2017-02-23 DIAGNOSIS — I10 Essential (primary) hypertension: Secondary | ICD-10-CM

## 2017-02-23 DIAGNOSIS — Z636 Dependent relative needing care at home: Secondary | ICD-10-CM | POA: Diagnosis not present

## 2017-02-23 DIAGNOSIS — E782 Mixed hyperlipidemia: Secondary | ICD-10-CM | POA: Diagnosis not present

## 2017-02-23 DIAGNOSIS — E039 Hypothyroidism, unspecified: Secondary | ICD-10-CM

## 2017-02-23 DIAGNOSIS — F4321 Adjustment disorder with depressed mood: Secondary | ICD-10-CM | POA: Diagnosis not present

## 2017-02-23 LAB — LIPID PANEL
CHOL/HDL RATIO: 2
Cholesterol: 169 mg/dL (ref 0–200)
HDL: 70.7 mg/dL (ref >39.00)
LDL CALC: 75 mg/dL (ref 0–99)
NonHDL: 97.86
Triglycerides: 113 mg/dL (ref 0.0–149.0)
VLDL: 22.6 mg/dL (ref 0.0–40.0)

## 2017-02-23 LAB — COMPREHENSIVE METABOLIC PANEL
ALK PHOS: 82 U/L (ref 39–117)
ALT: 8 U/L (ref 0–35)
AST: 10 U/L (ref 0–37)
Albumin: 3.8 g/dL (ref 3.5–5.2)
BUN: 10 mg/dL (ref 6–23)
CO2: 26 meq/L (ref 19–32)
Calcium: 8.9 mg/dL (ref 8.4–10.5)
Chloride: 104 mEq/L (ref 96–112)
Creatinine, Ser: 0.76 mg/dL (ref 0.40–1.20)
GFR: 81.31 mL/min (ref >60.00)
GLUCOSE: 102 mg/dL — AB (ref 70–99)
POTASSIUM: 3.6 meq/L (ref 3.5–5.1)
SODIUM: 139 meq/L (ref 135–145)
TOTAL PROTEIN: 6.6 g/dL (ref 6.0–8.3)
Total Bilirubin: 0.4 mg/dL (ref 0.2–1.2)

## 2017-02-23 LAB — TSH: TSH: 2.56 u[IU]/mL (ref 0.35–4.50)

## 2017-02-23 MED ORDER — AMLODIPINE BESYLATE 10 MG PO TABS: 10.0000 mg | ORAL_TABLET | Freq: Every day | ORAL | 3 refills | Status: DC

## 2017-02-23 MED ORDER — BUTALBITAL-APAP-CAFFEINE 50-325-40 MG PO TABS: 1.0000 | ORAL_TABLET | Freq: Four times a day (QID) | ORAL | 1 refills | Status: AC | PRN

## 2017-02-23 MED ORDER — LEVOTHYROXINE SODIUM 50 MCG PO TABS: 50.0000 ug | ORAL_TABLET | Freq: Every day | ORAL | 3 refills | Status: DC

## 2017-02-23 MED ORDER — LOSARTAN POTASSIUM 25 MG PO TABS: 25.0000 mg | ORAL_TABLET | Freq: Every day | ORAL | 3 refills | Status: DC

## 2017-02-23 NOTE — Patient Instructions (Addendum)
It was great to see you today- I will be in touch with your labs asap  Please let me know if the higher dose of cymbalta is not working as well for you I would also recommend a bone density scan if not done in the last 2-3 years Please ask Dr. Verlon Au office to send me a copy of your mammogram report  Your blood pressure looks good!    Health Maintenance for Postmenopausal Women Menopause is a normal process in which your reproductive ability comes to an end. This process happens gradually over a span of months to years, usually between the ages of 61 and 3. Menopause is complete when you have missed 12 consecutive menstrual periods. It is important to talk with your health care provider about some of the most common conditions that affect postmenopausal women, such as heart disease, cancer, and bone loss (osteoporosis). Adopting a healthy lifestyle and getting preventive care can help to promote your health and wellness. Those actions can also lower your chances of developing some of these common conditions. What should I know about menopause? During menopause, you may experience a number of symptoms, such as:  Moderate-to-severe hot flashes.  Night sweats.  Decrease in sex drive.  Mood swings.  Headaches.  Tiredness.  Irritability.  Memory problems.  Insomnia.  Choosing to treat or not to treat menopausal changes is an individual decision that you make with your health care provider. What should I know about hormone replacement therapy and supplements? Hormone therapy products are effective for treating symptoms that are associated with menopause, such as hot flashes and night sweats. Hormone replacement carries certain risks, especially as you become older. If you are thinking about using estrogen or estrogen with progestin treatments, discuss the benefits and risks with your health care provider. What should I know about heart disease and stroke? Heart disease, heart attack,  and stroke become more likely as you age. This may be due, in part, to the hormonal changes that your body experiences during menopause. These can affect how your body processes dietary fats, triglycerides, and cholesterol. Heart attack and stroke are both medical emergencies. There are many things that you can do to help prevent heart disease and stroke:  Have your blood pressure checked at least every 1-2 years. High blood pressure causes heart disease and increases the risk of stroke.  If you are 62-59 years old, ask your health care provider if you should take aspirin to prevent a heart attack or a stroke.  Do not use any tobacco products, including cigarettes, chewing tobacco, or electronic cigarettes. If you need help quitting, ask your health care provider.  It is important to eat a healthy diet and maintain a healthy weight. ? Be sure to include plenty of vegetables, fruits, low-fat dairy products, and lean protein. ? Avoid eating foods that are high in solid fats, added sugars, or salt (sodium).  Get regular exercise. This is one of the most important things that you can do for your health. ? Try to exercise for at least 150 minutes each week. The type of exercise that you do should increase your heart rate and make you sweat. This is known as moderate-intensity exercise. ? Try to do strengthening exercises at least twice each week. Do these in addition to the moderate-intensity exercise.  Know your numbers.Ask your health care provider to check your cholesterol and your blood glucose. Continue to have your blood tested as directed by your health care provider.  What  should I know about cancer screening? There are several types of cancer. Take the following steps to reduce your risk and to catch any cancer development as early as possible. Breast Cancer  Practice breast self-awareness. ? This means understanding how your breasts normally appear and feel. ? It also means doing  regular breast self-exams. Let your health care provider know about any changes, no matter how small.  If you are 67 or older, have a clinician do a breast exam (clinical breast exam or CBE) every year. Depending on your age, family history, and medical history, it may be recommended that you also have a yearly breast X-ray (mammogram).  If you have a family history of breast cancer, talk with your health care provider about genetic screening.  If you are at high risk for breast cancer, talk with your health care provider about having an MRI and a mammogram every year.  Breast cancer (BRCA) gene test is recommended for women who have family members with BRCA-related cancers. Results of the assessment will determine the need for genetic counseling and BRCA1 and for BRCA2 testing. BRCA-related cancers include these types: ? Breast. This occurs in males or females. ? Ovarian. ? Tubal. This may also be called fallopian tube cancer. ? Cancer of the abdominal or pelvic lining (peritoneal cancer). ? Prostate. ? Pancreatic.  Cervical, Uterine, and Ovarian Cancer Your health care provider may recommend that you be screened regularly for cancer of the pelvic organs. These include your ovaries, uterus, and vagina. This screening involves a pelvic exam, which includes checking for microscopic changes to the surface of your cervix (Pap test).  For women ages 21-65, health care providers may recommend a pelvic exam and a Pap test every three years. For women ages 30-65, they may recommend the Pap test and pelvic exam, combined with testing for human papilloma virus (HPV), every five years. Some types of HPV increase your risk of cervical cancer. Testing for HPV may also be done on women of any age who have unclear Pap test results.  Other health care providers may not recommend any screening for nonpregnant women who are considered low risk for pelvic cancer and have no symptoms. Ask your health care provider  if a screening pelvic exam is right for you.  If you have had past treatment for cervical cancer or a condition that could lead to cancer, you need Pap tests and screening for cancer for at least 20 years after your treatment. If Pap tests have been discontinued for you, your risk factors (such as having a new sexual partner) need to be reassessed to determine if you should start having screenings again. Some women have medical problems that increase the chance of getting cervical cancer. In these cases, your health care provider may recommend that you have screening and Pap tests more often.  If you have a family history of uterine cancer or ovarian cancer, talk with your health care provider about genetic screening.  If you have vaginal bleeding after reaching menopause, tell your health care provider.  There are currently no reliable tests available to screen for ovarian cancer.  Lung Cancer Lung cancer screening is recommended for adults 31-59 years old who are at high risk for lung cancer because of a history of smoking. A yearly low-dose CT scan of the lungs is recommended if you:  Currently smoke.  Have a history of at least 30 pack-years of smoking and you currently smoke or have quit within the past 15  years. A pack-year is smoking an average of one pack of cigarettes per day for one year.  Yearly screening should:  Continue until it has been 15 years since you quit.  Stop if you develop a health problem that would prevent you from having lung cancer treatment.  Colorectal Cancer  This type of cancer can be detected and can often be prevented.  Routine colorectal cancer screening usually begins at age 53 and continues through age 62.  If you have risk factors for colon cancer, your health care provider may recommend that you be screened at an earlier age.  If you have a family history of colorectal cancer, talk with your health care provider about genetic screening.  Your  health care provider may also recommend using home test kits to check for hidden blood in your stool.  A small camera at the end of a tube can be used to examine your colon directly (sigmoidoscopy or colonoscopy). This is done to check for the earliest forms of colorectal cancer.  Direct examination of the colon should be repeated every 5-10 years until age 50. However, if early forms of precancerous polyps or small growths are found or if you have a family history or genetic risk for colorectal cancer, you may need to be screened more often.  Skin Cancer  Check your skin from head to toe regularly.  Monitor any moles. Be sure to tell your health care provider: ? About any new moles or changes in moles, especially if there is a change in a mole's shape or color. ? If you have a mole that is larger than the size of a pencil eraser.  If any of your family members has a history of skin cancer, especially at a young age, talk with your health care provider about genetic screening.  Always use sunscreen. Apply sunscreen liberally and repeatedly throughout the day.  Whenever you are outside, protect yourself by wearing long sleeves, pants, a wide-brimmed hat, and sunglasses.  What should I know about osteoporosis? Osteoporosis is a condition in which bone destruction happens more quickly than new bone creation. After menopause, you may be at an increased risk for osteoporosis. To help prevent osteoporosis or the bone fractures that can happen because of osteoporosis, the following is recommended:  If you are 90-67 years old, get at least 1,000 mg of calcium and at least 600 mg of vitamin D per day.  If you are older than age 1 but younger than age 75, get at least 1,200 mg of calcium and at least 600 mg of vitamin D per day.  If you are older than age 39, get at least 1,200 mg of calcium and at least 800 mg of vitamin D per day.  Smoking and excessive alcohol intake increase the risk of  osteoporosis. Eat foods that are rich in calcium and vitamin D, and do weight-bearing exercises several times each week as directed by your health care provider. What should I know about how menopause affects my mental health? Depression may occur at any age, but it is more common as you become older. Common symptoms of depression include:  Low or sad mood.  Changes in sleep patterns.  Changes in appetite or eating patterns.  Feeling an overall lack of motivation or enjoyment of activities that you previously enjoyed.  Frequent crying spells.  Talk with your health care provider if you think that you are experiencing depression. What should I know about immunizations? It is important that  you get and maintain your immunizations. These include:  Tetanus, diphtheria, and pertussis (Tdap) booster vaccine.  Influenza every year before the flu season begins.  Pneumonia vaccine.  Shingles vaccine.  Your health care provider may also recommend other immunizations. This information is not intended to replace advice given to you by your health care provider. Make sure you discuss any questions you have with your health care provider. Document Released: 03/05/2005 Document Revised: 08/01/2015 Document Reviewed: 10/15/2014 Elsevier Interactive Patient Education  2018 [employer].

## 2017-03-31 ENCOUNTER — Telehealth: Payer: Self-pay

## 2017-03-31 DIAGNOSIS — I10 Essential (primary) hypertension: Secondary | ICD-10-CM

## 2017-03-31 NOTE — Telephone Encounter (Signed)
Copied from CRM [id number]. Topic: General - Other >> Mar 31, 2017  3:43 PM Gerrianne ScalePayne, Angela L wrote: Reason for CRM: patient calling stating that she would like for Dr Patsy Lageropland to write her BP medicine Amlodipine in a smaller dosage because the pill is to big for her to swallow she would like for it to be cut in half to two 5mg  tablets

## 2017-04-01 MED ORDER — AMLODIPINE BESYLATE 5 MG PO TABS: 10.0000 mg | ORAL_TABLET | Freq: Every day | ORAL | 3 refills | Status: DC

## 2017-04-01 NOTE — Addendum Note (Signed)
Addended by: Abbe AmsterdamOPLAND, Seyon Strader C on: 04/01/2017 12:28 PM   Modules accepted: Orders

## 2017-04-01 NOTE — Telephone Encounter (Signed)
Called pt- I am not sure if the 5mg  pills are smaller.  She has used them in the past and states that they are smaller. In this case will make sub for her

## 2017-04-13 IMAGING — MR MR HEAD W/O CM
8 of 10 series · 39 of 48 positions shown · non-contrast
Comparison: 04/12/2014 head CT.

CLINICAL DATA: 63-year-old female with persistent headaches, some
memory loss, visual disturbance, nausea and depression. History of
hypertension, hyperlipidemia and anxiety. No history of cancer.
Subsequent encounter.

EXAM:
MRI HEAD WITHOUT CONTRAST
TECHNIQUE: Multiplanar, multiecho pulse sequences of the brain and surrounding
structures were obtained without intravenous contrast.

[Series 3: T1 · sagittal · 5.0mm · 0.47mm/px · 2 of 23 slices shown]
[im 1/23]
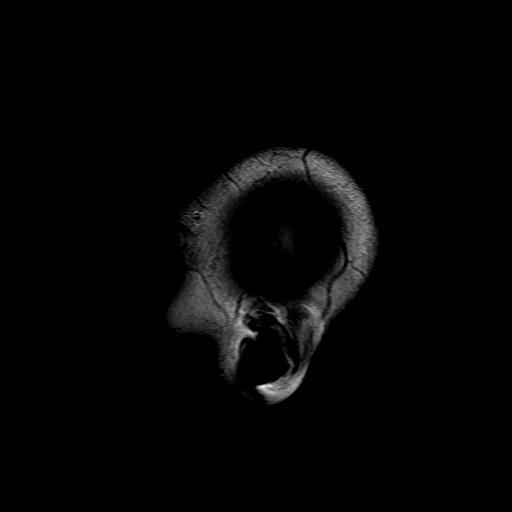
[im 23/23]
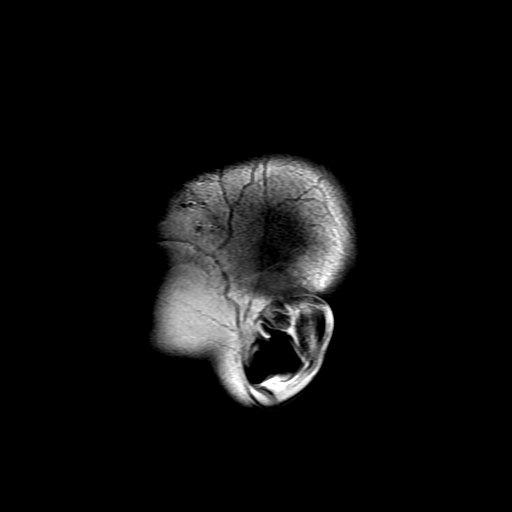

[Series 4: DWI · axial · 3.0mm · 1.09mm/px · z∈[-54,+91]mm · 11 of 102 slices shown (1 of 4)]
[im 1/102]
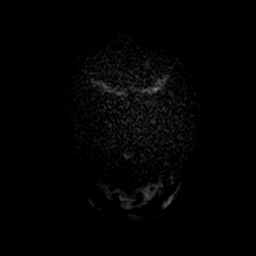
[im 11/102]
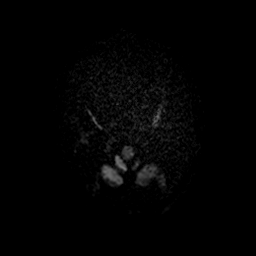
[im 21/102]
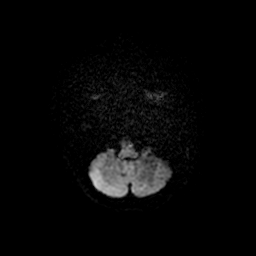
[im 31/102]
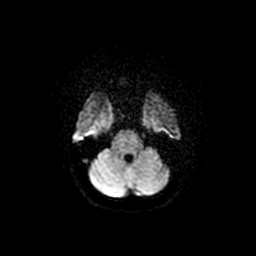
[im 41/102]
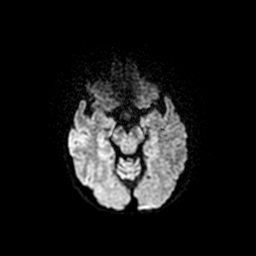
[im 51/102]
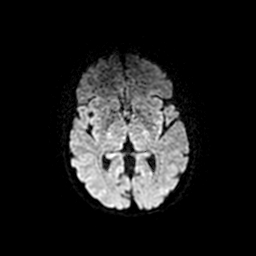
[im 61/102]
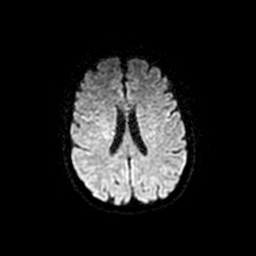
[im 71/102]
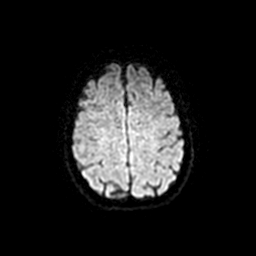
[im 81/102]
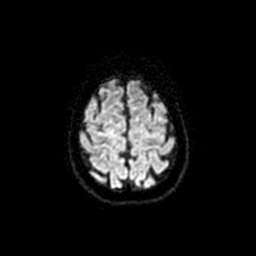
[im 91/102]
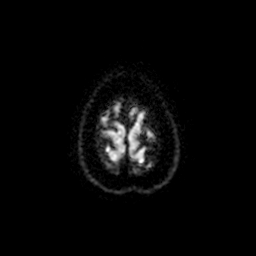
[im 102/102]
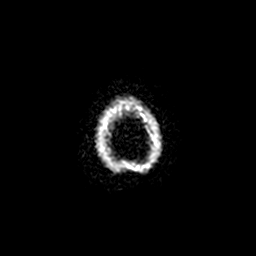

[Series 5: DWI · coronal · 5.0mm · 1.09mm/px · 7 of 66 slices shown (2 of 4)]
[im 1/66]
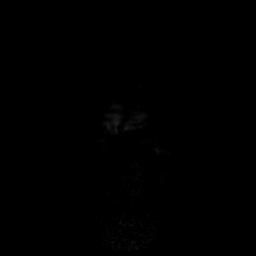
[im 11/66]
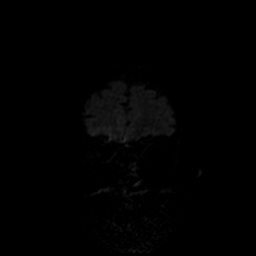
[im 22/66]
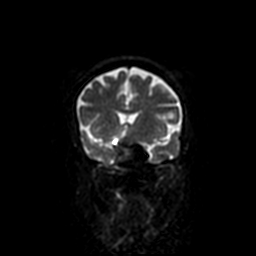
[im 33/66]
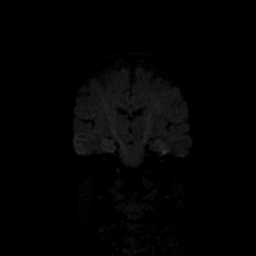
[im 44/66]
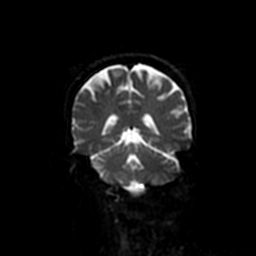
[im 55/66]
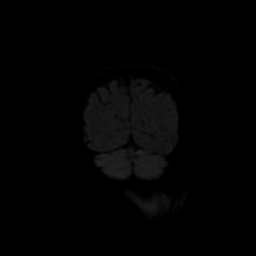
[im 66/66]
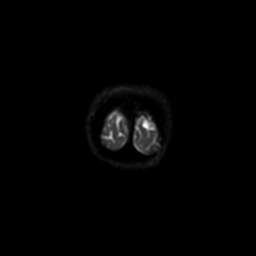

[Series 6: T2 · axial · 5.0mm · 0.43mm/px · z∈[-49,+95]mm · 3 of 24 slices shown (1 of 2)]
[im 1/24]
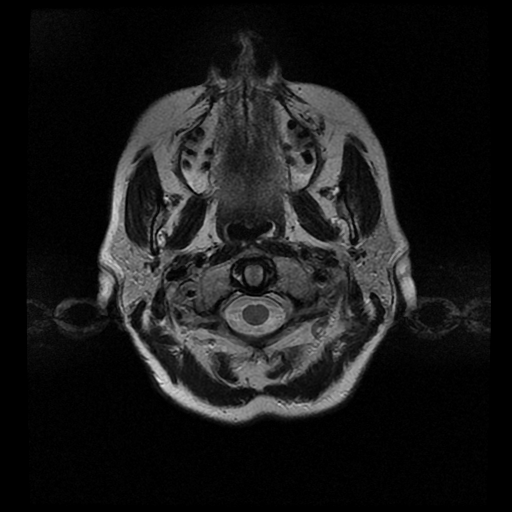
[im 12/24]
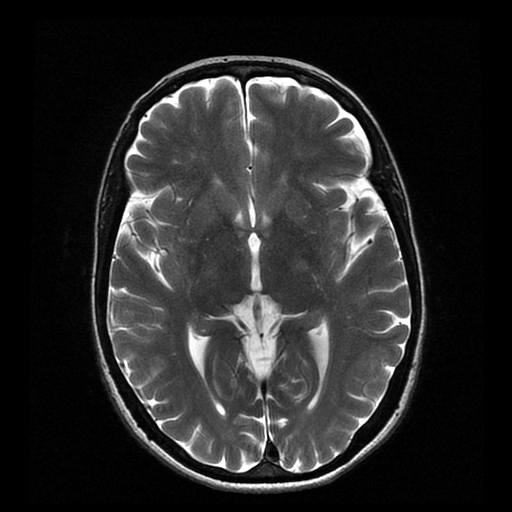
[im 24/24]
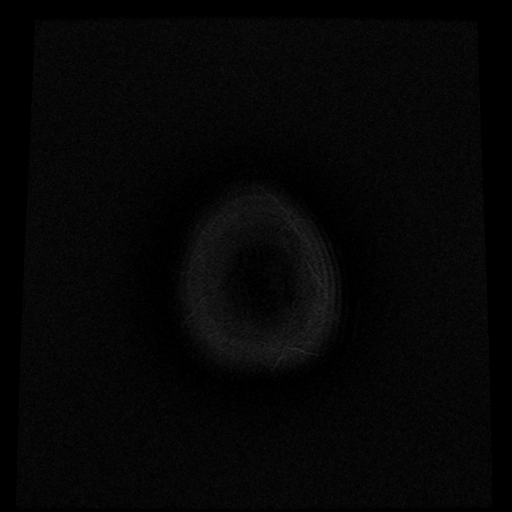

[Series 7: FLAIR · axial · 5.0mm · 0.43mm/px · z∈[-55,+101]mm · 3 of 24 slices shown]
[im 1/24]
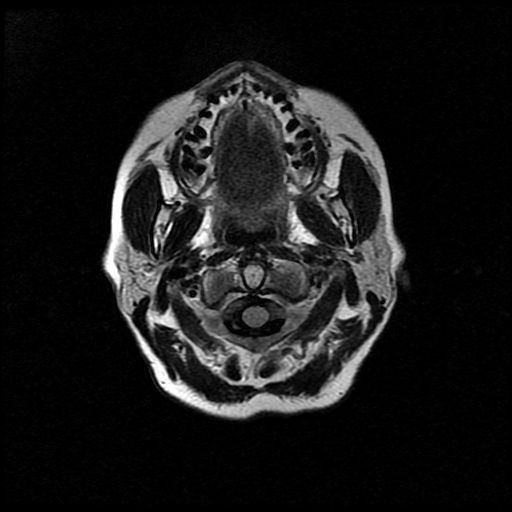
[im 12/24]
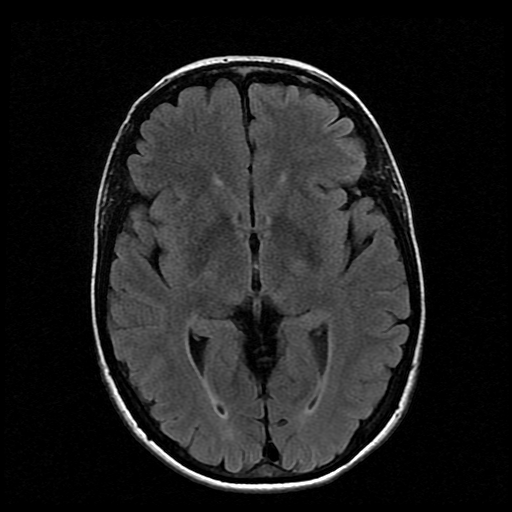
[im 24/24]
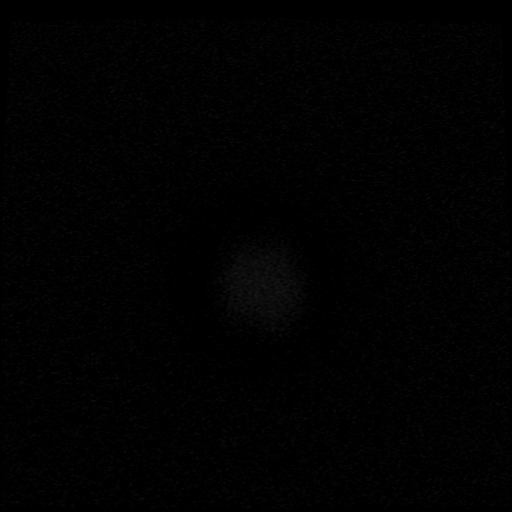

[Series 10: T2 · coronal · 5.0mm · 0.45mm/px · 3 of 26 slices shown (2 of 2)]
[im 1/26]
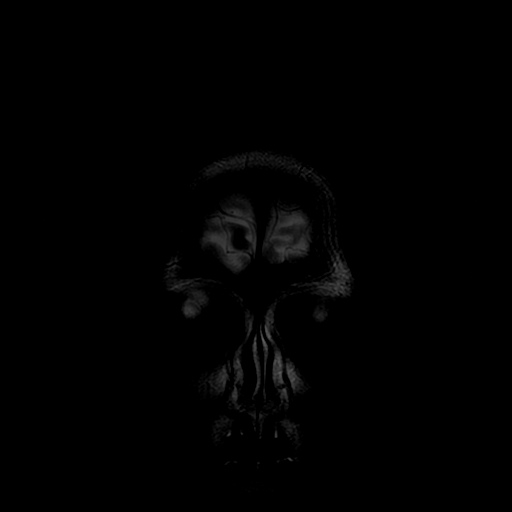
[im 13/26]
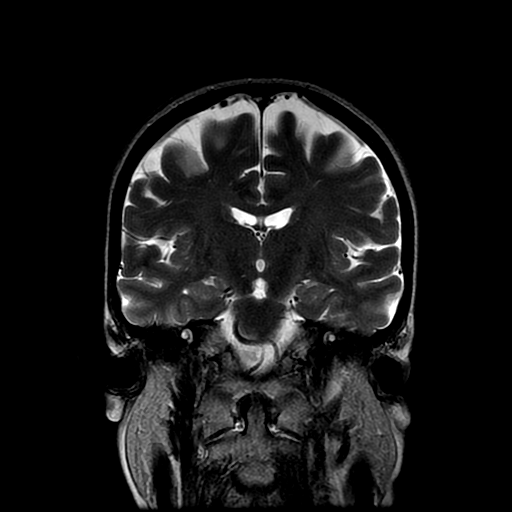
[im 26/26]
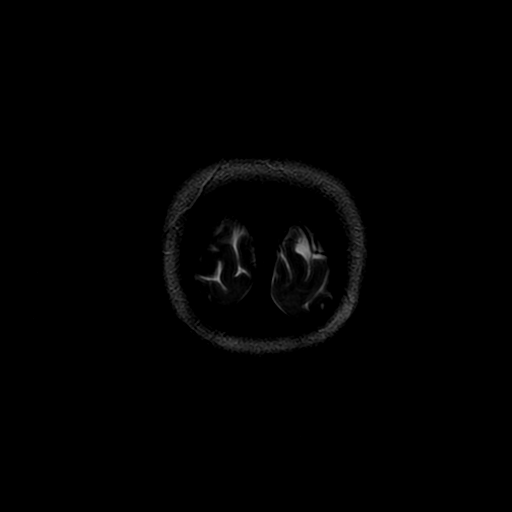

[Series 400: DWI · axial · 3.0mm · 1.09mm/px · z∈[-54,+91]mm · 6 of 51 slices shown (3 of 4)]
[im 1/51]
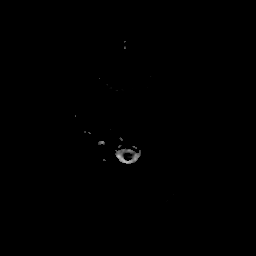
[im 11/51]
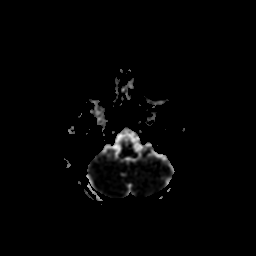
[im 21/51]
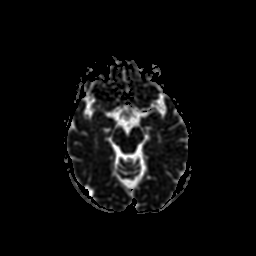
[im 31/51]
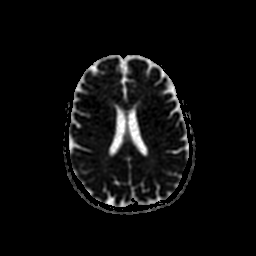
[im 41/51]
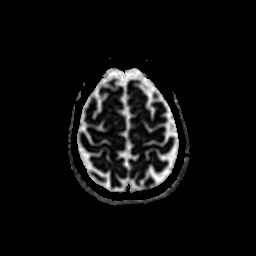
[im 51/51]
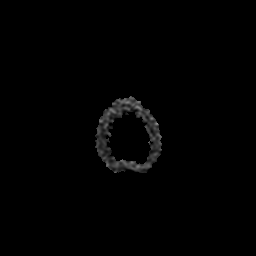

[Series 500: DWI · coronal · 5.0mm · 1.09mm/px · 4 of 33 slices shown (4 of 4)]
[im 1/33]
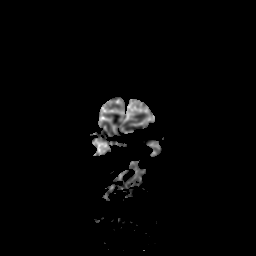
[im 11/33]
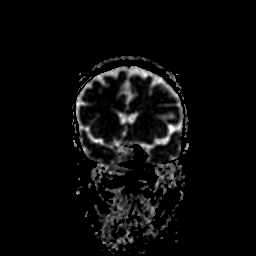
[im 22/33]
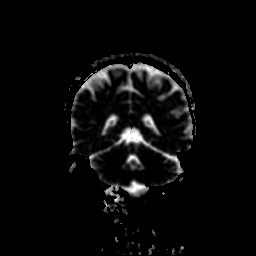
[im 33/33]
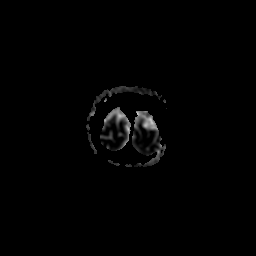

[39 of 48 positions shown; findings below may reference images not displayed]

FINDINGS: No acute infarct.

No intracranial hemorrhage.

No hydrocephalus.

No intracranial mass lesion noted on this unenhanced exam.

Major intracranial vascular structures are patent.

Opacification right sphenoid sinus.

Kyphosis upper cervical spine with spinal stenosis C3-4 level.

Cervical medullary junction, pituitary region, pineal region and
orbital structures unremarkable.
IMPRESSION: No acute infarct, intracranial hemorrhage or hydrocephalus.

No intracranial mass lesion noted on this unenhanced exam.

Opacification right sphenoid sinus.

Kyphosis upper cervical spine with spinal stenosis C3-4 level.

## 2017-08-16 ENCOUNTER — Other Ambulatory Visit: Payer: Self-pay | Admitting: Family Medicine

## 2017-10-05 ENCOUNTER — Telehealth: Payer: Self-pay | Admitting: Family Medicine

## 2017-10-05 NOTE — Telephone Encounter (Signed)
Copied from CRM [id number]. Topic: General - Other >> Oct 05, 2017 12:16 PM Leafy Ro wrote: Reason for CRM: shantae from [facility] is calling the pt needs PA for duloxetine 30 mg to take 3 pills a day. Please call [phone] to obtain a PA

## 2017-10-07 NOTE — Telephone Encounter (Signed)
[facility] initiated by phone.  Awaiting determination, Case #: [id number].

## 2017-10-25 ENCOUNTER — Other Ambulatory Visit: Payer: Self-pay | Admitting: Family Medicine

## 2017-10-25 ENCOUNTER — Telehealth: Payer: Self-pay

## 2017-10-25 NOTE — Telephone Encounter (Signed)
PA initiated via Covermymeds; KEY: [id number]. Awaiting determination.

## 2017-10-26 NOTE — Telephone Encounter (Signed)
PA denied. Pt must first try and fail ketoprofen or ketoprofen ER or Nalfon (fenoprofen).

## 2017-10-27 NOTE — Telephone Encounter (Signed)
Called her- she does not need the fiorcet right now anyway, but will just pay for it when she does need it as it is never covered

## 2017-11-10 ENCOUNTER — Other Ambulatory Visit: Payer: Self-pay | Admitting: Family Medicine

## 2017-11-10 DIAGNOSIS — M150 Primary generalized (osteo)arthritis: Principal | ICD-10-CM

## 2017-11-10 DIAGNOSIS — M159 Polyosteoarthritis, unspecified: Secondary | ICD-10-CM

## 2017-12-23 ENCOUNTER — Other Ambulatory Visit: Payer: Self-pay | Admitting: Family Medicine

## 2017-12-23 DIAGNOSIS — I10 Essential (primary) hypertension: Secondary | ICD-10-CM

## 2018-01-09 ENCOUNTER — Other Ambulatory Visit: Payer: Self-pay | Admitting: Family Medicine

## 2018-01-12 ENCOUNTER — Telehealth: Payer: Self-pay | Admitting: Family Medicine

## 2018-01-12 MED ORDER — DULOXETINE HCL 30 MG PO CPEP
90.0000 mg | ORAL_CAPSULE | Freq: Every day | ORAL | 0 refills | Status: DC
Start: 2018-01-12 — End: 2018-02-23

## 2018-01-12 NOTE — Telephone Encounter (Signed)
New rx for 30 day supply has been sent in. Patient needs appointment for further refills.

## 2018-01-12 NOTE — Telephone Encounter (Signed)
Copied from CRM [id number]. Topic: General - Other >> Jan 12, 2018  1:17 PM Leafy Roobinson, Norma J wrote: Reason for CRM: pt only received 30 cymbalta capsules for 10 day supply. Pt has a physical scheduled for 02-23-2018. Pt would like enough cymbalta until her physical on 02-23-2018 . Pt take 3 cymbalta capsules  a day.  [facility] [place]

## 2018-01-23 ENCOUNTER — Other Ambulatory Visit: Payer: Self-pay | Admitting: Family Medicine

## 2018-01-23 DIAGNOSIS — I10 Essential (primary) hypertension: Secondary | ICD-10-CM

## 2018-02-06 ENCOUNTER — Other Ambulatory Visit: Payer: Self-pay | Admitting: Family Medicine

## 2018-02-06 ENCOUNTER — Ambulatory Visit (INDEPENDENT_AMBULATORY_CARE_PROVIDER_SITE_OTHER): Payer: Medicare Other | Admitting: Family

## 2018-02-06 VITALS — BP 139/76 | HR 76 | Temp 98.5°F | Resp 16 | Ht 67.0 in | Wt 117.0 lb

## 2018-02-06 DIAGNOSIS — M159 Polyosteoarthritis, unspecified: Secondary | ICD-10-CM

## 2018-02-06 DIAGNOSIS — M150 Primary generalized (osteo)arthritis: Principal | ICD-10-CM

## 2018-02-06 DIAGNOSIS — H6982 Other specified disorders of Eustachian tube, left ear: Secondary | ICD-10-CM

## 2018-02-06 NOTE — Progress Notes (Signed)
Subjective:    Patient ID: Kristy Arias, female    DOB: 1950-07-12, 68 y.o.   MRN: [id number]  HPI  Patient is a 68 yr old female who presents today with chief complaint of right ear fullness.  Reports that symptoms began about 3 weeks ago with cough and sore throat.  Cough improved but still has some drainage and is having trouble hearing out of her right ear. Tried debrox and phenylephrine without improvement.   Reports that she went to an urgent care on [address] and was rx'd augmentin and cough syrup.  She completed course. Coughing and sore throat had gone away but right ear had stopped up.  She had doxy on hand and decided to try along with a 6 day taper of prednisone.   Review of Systems    see HPI  Past Medical History:  Diagnosis Date  . Anemia   . Anxiety   . Arthritis   . Depression   . GERD (gastroesophageal reflux disease)   . Glaucoma   . Headache   . Heart murmur   . Hyperlipidemia   . Hypertension   . Hypothyroidism   . Shortness of breath    syndrome x  . Syndrome X, cardiac [facility])      Social History   Socioeconomic History  . Marital status: Married    Spouse name: Not on file  . Number of children: Not on file  . Years of education: Not on file  . Highest education level: Not on file  Occupational History  . Not on file  Social Needs  . Financial resource strain: Not on file  . Food insecurity:    Worry: Not on file    Inability: Not on file  . Transportation needs:    Medical: Not on file    Non-medical: Not on file  Tobacco Use  . Smoking status: Never Smoker  . Smokeless tobacco: Never Used  Substance and Sexual Activity  . Alcohol use: No  . Drug use: No  . Sexual activity: Yes    Birth control/protection: Surgical  Lifestyle  . Physical activity:    Days per week: Not on file    Minutes per session: Not on file  . Stress: Not on file  Relationships  . Social connections:    Talks on phone: Not on file    Gets together:  Not on file    Attends religious service: Not on file    Active member of club or organization: Not on file    Attends meetings of clubs or organizations: Not on file    Relationship status: Not on file  . Intimate partner violence:    Fear of current or ex partner: Not on file    Emotionally abused: Not on file    Physically abused: Not on file    Forced sexual activity: Not on file  Other Topics Concern  . Not on file  Social History Narrative  . Not on file    Past Surgical History:  Procedure Laterality Date  . ABDOMINAL HYSTERECTOMY    . BREAST ENHANCEMENT SURGERY    . BREAST SURGERY    . CARDIAC CATHETERIZATION  4/14  . CHOLECYSTECTOMY    . COSMETIC SURGERY    . DILATION AND CURETTAGE OF UTERUS  2013  . EYE SURGERY    . LAPAROSCOPIC CHOLECYSTECTOMY SINGLE PORT N/A 06/29/2012   Procedure: LAPAROSCOPIC CHOLECYSTECTOMY SINGLE PORT IOC ;  Surgeon: Ardeth Sportsman, MD;  Location:  [facility];  Service: General;  Laterality: N/A;  . LEFT HEART CATHETERIZATION WITH CORONARY ANGIOGRAM N/A 05/23/2012   Procedure: LEFT HEART CATHETERIZATION WITH CORONARY ANGIOGRAM;  Surgeon: Pamella PertJagadeesh R Ganji, MD;  [facility];  Service: Cardiovascular;  Laterality: N/A;  . TOTAL KNEE ARTHROPLASTY Right 08/25/2015   Procedure: RIGHT TOTAL KNEE ARTHROPLASTY;  Surgeon: Ollen GrossFrank Aluisio, MD;  Location: [facility];  Service: Orthopedics;  Laterality: Right;  . TOTAL KNEE ARTHROPLASTY Left 08/09/2016   Procedure: LEFT TOTAL KNEE ARTHROPLASTY;  Surgeon: Ollen GrossAluisio, Frank, MD;  Location: [facility];  Service: Orthopedics;  Laterality: Left;    Family History  Problem Relation Age of Onset  . Hypertension Father   . Diabetes Father   . Heart disease Father   . Hyperlipidemia Father   . Hypertension Maternal Grandmother   . Heart disease Maternal Grandmother   . Diabetes Maternal Grandfather   . Hyperlipidemia Maternal Grandfather   . Heart disease Maternal Grandfather   . Osteoporosis Paternal Grandmother   .  Heart disease Paternal Grandmother   . Heart disease Paternal Grandfather   . Cancer Mother   . Hyperlipidemia Mother   . Hypertension Mother   . Cancer Sister     No Known Allergies  Current Outpatient Medications on File Prior to Visit  Medication Sig Dispense Refill  . ALPRAZolam (XANAX) 0.5 MG tablet Take 1 tablet (0.5 mg total) by mouth at bedtime as needed for anxiety. (Patient taking differently: Take 0.5 mg by mouth 3 (three) times daily as needed for anxiety. ) 30 tablet 2  . amLODipine (NORVASC) 5 MG tablet Take 2 tablets (10 mg total) by mouth daily. 180 tablet 3  . butalbital-acetaminophen-caffeine (FIORICET, ESGIC) 50-325-40 MG tablet Take 1-2 tablets by mouth every 6 (six) hours as needed for headache. Max 6 per day 20 tablet 1  . diclofenac (VOLTAREN) 75 MG EC tablet TAKE 1 TABLET BY MOUTH 2 TIMES DAILY AS NEEDED FOR PAIN 180 tablet 0  . diphenoxylate-atropine (LOMOTIL) 2.5-0.025 MG tablet Take 1 tablet by mouth 4 (four) times daily as needed for diarrhea or loose stools. 30 tablet 1  . DULoxetine (CYMBALTA) 30 MG capsule Take 3 capsules (90 mg total) by mouth daily. 90 capsule 0  . estradiol (VIVELLE-DOT) 0.1 MG/24HR patch Place 0.5 patches onto the skin 2 (two) times a week. Sunday & Thursday    . levothyroxine (SYNTHROID, LEVOTHROID) 50 MCG tablet Take 1 tablet (50 mcg total) by mouth daily. 90 tablet 3  . losartan (COZAAR) 25 MG tablet TAKE 1 TABLET BY MOUTH EVERY DAY 30 tablet 0  . rosuvastatin (CRESTOR) 5 MG tablet Take 1 tablet (5 mg total) by mouth at bedtime. (Patient taking differently: Take 5 mg by mouth 4 (four) times a week. At bedtime.) 30 tablet 11   No current facility-administered medications on file prior to visit.     BP 139/76 (BP Location: Left Arm, Patient Position: Sitting, Cuff Size: Small)   Pulse 76   Temp 98.5 F (36.9 C) (Oral)   Resp 16   Ht 5\' 7"  (1.702 m)   Wt 117 lb (53.1 kg)   SpO2 99%   BMI 18.32 kg/m    Objective:   Physical  Exam Constitutional:      Appearance: She is well-developed.  HENT:     Right Ear: Ear canal normal.     Left Ear: Tympanic membrane and ear canal normal.     Ears:     Comments: Right TM is retracted.  Slight erythema at 12 oclock Neck:     Musculoskeletal: Neck supple.     Thyroid: No thyromegaly.  Cardiovascular:     Rate and Rhythm: Normal rate and regular rhythm.     Heart sounds: Normal heart sounds. No murmur.  Pulmonary:     Effort: Pulmonary effort is normal. No respiratory distress.     Breath sounds: Normal breath sounds. No wheezing.  Skin:    General: Skin is warm and dry.  Neurological:     Mental Status: She is alert and oriented to person, place, and time.  Psychiatric:        Behavior: Behavior normal.        Thought Content: Thought content normal.        Judgment: Judgment normal.           Assessment & Plan:  Eustachian tube dysfunction- advised pt to d/c abx at this point.  No definite OM and she has had nearly 2 weeks of abx. OK to continue phenylephrine prn with close monitoring if her BP. Add flonase.  Advised pt to call if symptoms worsen or if not improved in 7-10 days. We can consider referral to ENT at that time.

## 2018-02-06 NOTE — Patient Instructions (Signed)
Please add flonase 2 sprays each nostril once daily.   Call if ear symptoms worsen or if not improved in 7-10 days.

## 2018-02-08 ENCOUNTER — Encounter: Payer: Self-pay | Admitting: Family Medicine

## 2018-02-18 NOTE — Progress Notes (Signed)
[facility] [address], [address] [place], [place] [address] [phone]  Date:  02/23/2018   Name:  Kristy Arias   DOB:  1950/11/05   MRN:  [id number]  PCP:  Kristy Cables, Arias    Chief Complaint: Annual Exam (due for mammo in march) and Fluid in Ear (saw Kristy Arias on 1/13 still having ear pain in right ear)   History of Present Illness:  Kristy Arias is a 68 y.o. very pleasant female patient who presents with the following:  Here today for physical.  History of hypertension, hypothyroidism, hyperlipidemia, osteoarthritis, migraine headache. I last saw her for physical about 1 year ago A few days before christmas she got sick with URI type sx.  Went to [facility] and got augmentin.  She did take 5 days of pred but this was about a month ago  She was in about 2 weeks ago with left ear "feeling clogged"- this has not gone away.  She saw Kristy Arias, was diagnosed with eustachian tube dysfunction She did try flonase  Has not tried afrin so far She is getting frustrated by this symptom, would like to try a short course of prednisone to see if helpful.  Pap: December 2018, she sees Kristy Arias, UTD Mammogram: done last March per her report Colonoscopy: Looks like this was done in 2010, due now.    She would like to do cologuard No family or personal history of colon cancer DEXA scan: done per her GYN  Labs: A year ago, due for full panel today Immunizations: Flu is up-to-date Start pneumonia series Recommend Shingrix  Married to Kristy Arias, she has an adult daughter and a granddaughter who is 8, and grandson who is nearly 49 yo Her mother- who is 70- was not doing well at home.  She has significant dementia and parkinson's disease. They moved her to a facility that she is tolerating well just recently.  This has helped her father a lot, he is less stressed.  She is babysitting twice a week and also going to see her mom (who is now  living in [place]) twice a week.  She has had both her knees replaced, having some right knee pain. She went to see Kristy Arias and was sent back to PT Overall this is better, but she has pain if she sits with her knee bent for too long, or with driving  She knows that she needs to exercise, but does not really enjoy doing this.    She sees Kristy Arias for SOB; she does not have any CP She did a heart cath a few years ago; looks okay  She uses fioricet for HA; she might take this once a day for both headache or for joint pains.  Advised her to save this for HA only as it is a controlled substance She is taking voltaren BID as well for joint pains  Patient Active Problem List   Diagnosis Date Noted  . Chest pain 12/01/2016  . OA (osteoarthritis) of knee 08/25/2015  . Memory loss 06/05/2014  . Chronic migraine without aura without status migrainosus, not intractable 06/05/2014  . Chronic cholecystitis with calculus s/p lap chole 06/29/2012 04/13/2012  . Heartburn 04/13/2012  . Hypertension 11/02/2011  . Fibromuscular dysplasia (HCC) 11/02/2011  . Hyperlipidemia 11/02/2011  . Allergic rhinitis 11/02/2011  . Arthritis 11/02/2011  . Depression 11/02/2011    Past Medical History:  Diagnosis Date  .  Anemia   . Anxiety   . Arthritis   . Depression   . GERD (gastroesophageal reflux disease)   . Glaucoma   . Headache   . Heart murmur   . Hyperlipidemia   . Hypertension   . Hypothyroidism   . Shortness of breath    syndrome x  . Syndrome X, cardiac [facility](HCC)     Past Surgical History:  Procedure Laterality Date  . ABDOMINAL HYSTERECTOMY    . BREAST ENHANCEMENT SURGERY    . BREAST SURGERY    . CARDIAC CATHETERIZATION  4/14  . CHOLECYSTECTOMY    . COSMETIC SURGERY    . DILATION AND CURETTAGE OF UTERUS  2013  . EYE SURGERY    . LAPAROSCOPIC CHOLECYSTECTOMY SINGLE PORT N/A 06/29/2012   Procedure: LAPAROSCOPIC CHOLECYSTECTOMY SINGLE PORT IOC ;  Surgeon: Kristy SportsmanSteven C. Gross, Arias;  Location:  [facility];  Service: General;  Laterality: N/A;  . LEFT HEART CATHETERIZATION WITH CORONARY ANGIOGRAM N/A 05/23/2012   Procedure: LEFT HEART CATHETERIZATION WITH CORONARY ANGIOGRAM;  Surgeon: Kristy PertJagadeesh R Ganji, Arias;  [facility];  Service: Cardiovascular;  Laterality: N/A;  . TOTAL KNEE ARTHROPLASTY Right 08/25/2015   Procedure: RIGHT TOTAL KNEE ARTHROPLASTY;  Surgeon: Kristy GrossFrank Aluisio, Arias;  Location: [facility];  Service: Orthopedics;  Laterality: Right;  . TOTAL KNEE ARTHROPLASTY Left 08/09/2016   Procedure: LEFT TOTAL KNEE ARTHROPLASTY;  Surgeon: Kristy Arias, Frank, Arias;  Location: [facility];  Service: Orthopedics;  Laterality: Left;    Social History   Tobacco Use  . Smoking status: Never Smoker  . Smokeless tobacco: Never Used  Substance Use Topics  . Alcohol use: No  . Drug use: No    Family History  Problem Relation Age of Onset  . Hypertension Father   . Diabetes Father   . Heart disease Father   . Hyperlipidemia Father   . Hypertension Maternal Grandmother   . Heart disease Maternal Grandmother   . Diabetes Maternal Grandfather   . Hyperlipidemia Maternal Grandfather   . Heart disease Maternal Grandfather   . Osteoporosis Paternal Grandmother   . Heart disease Paternal Grandmother   . Heart disease Paternal Grandfather   . Cancer Mother   . Hyperlipidemia Mother   . Hypertension Mother   . Cancer Sister     No Known Allergies  Medication list has been reviewed and updated.  Current Outpatient Medications on File Prior to Visit  Medication Sig Dispense Refill  . ALPRAZolam (XANAX) 0.5 MG tablet Take 1 tablet (0.5 mg total) by mouth at bedtime as needed for anxiety. (Patient taking differently: Take 0.5 mg by mouth 3 (three) times daily as needed for anxiety. ) 30 tablet 2  . butalbital-acetaminophen-caffeine (FIORICET, ESGIC) 50-325-40 MG tablet Take 1-2 tablets by mouth every 6 (six) hours as needed for headache. Max 6 per day 20 tablet 1  . calcium carbonate (TUMS - DOSED  IN MG ELEMENTAL CALCIUM) 500 MG chewable tablet Chew 1 tablet by mouth 2 (two) times daily.    . cetirizine (ZYRTEC) 10 MG tablet Take 10 mg by mouth at bedtime.    . diclofenac (VOLTAREN) 75 MG EC tablet TAKE 1 TABLET BY MOUTH 2 TIMES DAILY AS NEEDED FOR PAIN 180 tablet 1  . estradiol (VIVELLE-DOT) 0.1 MG/24HR patch Place 0.5 patches onto the skin 2 (two) times a week. Sunday & Thursday    . fluticasone (FLONASE) 50 MCG/ACT nasal spray Place 2 sprays into both nostrils daily.    Marland Kitchen. levothyroxine (SYNTHROID, LEVOTHROID) 50  MCG tablet Take 1 tablet (50 mcg total) by mouth daily. 90 tablet 3  . losartan (COZAAR) 25 MG tablet TAKE 1 TABLET BY MOUTH EVERY DAY 30 tablet 0  . rosuvastatin (CRESTOR) 5 MG tablet Take 1 tablet (5 mg total) by mouth at bedtime. (Patient taking differently: Take 5 mg by mouth 4 (four) times a week. At bedtime.) 30 tablet 11  . triazolam (HALCION) 0.25 MG tablet Take 0.25 mg by mouth daily.     No current facility-administered medications on file prior to visit.     Review of Systems:  As per HPI- otherwise negative. No fever chills, no chest pain or shortness of breath  Wt Readings from Last 3 Encounters:  02/23/18 116 lb (52.6 kg)  02/06/18 117 lb (53.1 kg)  02/23/17 116 lb 3.2 oz (52.7 kg)     Physical Examination: Vitals:   02/23/18 1041  BP: 126/72  Pulse: 75  Resp: 16  Temp: 97.6 F (36.4 C)  SpO2: 99%   Vitals:   02/23/18 1041  Weight: 116 lb (52.6 kg)  Height: 5\' 7"  (1.702 m)   Body mass index is 18.17 kg/m. Ideal Body Weight: Weight in (lb) to have BMI = 25: 159.3  GEN: WDWN, NAD, Non-toxic, A & O x 3, looks well, underweight but stable HEENT: Atraumatic, Normocephalic. Neck supple. No masses, No LAD.  Bilateral TM wnl, oropharynx normal.  PEERL,EOMI.   There is some edema of the right nasal turbinates Ears and Nose: No external deformity. CV: RRR, No M/G/R. No JVD. No thrill. No extra heart sounds. PULM: CTA B, no wheezes, crackles,  rhonchi. No retractions. No resp. distress. No accessory muscle use. ABD: S, NT, ND, +BS. No rebound. No HSM. EXTR: No c/c/e NEURO Normal gait.  PSYCH: Normally interactive. Conversant. Not depressed or anxious appearing.  Calm demeanor.    Assessment and Plan: Physical exam  Screening for diabetes mellitus - Plan: Comprehensive metabolic panel, Hemoglobin A1c  Screening for thyroid disorder - Plan: TSH  Essential hypertension - Plan: CBC, Comprehensive metabolic panel, amLODipine (NORVASC) 5 MG tablet  Mixed hyperlipidemia - Plan: Lipid panel  Caregiver stress - Plan: DULoxetine (CYMBALTA) 30 MG capsule  Immunization due - Plan: Pneumococcal conjugate vaccine 13-valent IM  ETD (Eustachian tube dysfunction), right - Plan: predniSONE (DELTASONE) 20 MG tablet  Here today for a physical exam.  Refilled her amlodipine and Cymbalta Gave first of 2 pneumonia vaccines, recommended that she have Shingrix Ordered Cologuard She continues to be concerned about her memory, asked her to follow-up with her neurologist Encouraged her to use Fioricet only for headaches, not for more routine aches and pains Prednisone prescribed for eustachian tube dysfunction If this fails we will have her see ENT  Signed Abbe Amsterdam, Arias  Received her labs, message to patient Results for orders placed or performed in visit on 02/23/18  CBC  Result Value Ref Range   WBC 4.9 4.0 - 10.5 K/uL   RBC 4.31 3.87 - 5.11 Mil/uL   Platelets 250.0 150.0 - 400.0 K/uL   Hemoglobin 14.0 12.0 - 15.0 g/dL   HCT 02.5 85.2 - 77.8 %   MCV 96.9 78.0 - 100.0 fl   MCHC 33.6 30.0 - 36.0 g/dL   RDW 24.2 35.3 - 61.4 %  Comprehensive metabolic panel  Result Value Ref Range   Sodium 138 135 - 145 mEq/L   Potassium 3.8 3.5 - 5.1 mEq/L   Chloride 102 96 - 112 mEq/L   CO2 27 19 -  32 mEq/L   Glucose, Bld 93 70 - 99 mg/dL   BUN 11 6 - 23 mg/dL   Creatinine, Ser 1.61 0.40 - 1.20 mg/dL   Total Bilirubin 0.4 0.2 - 1.2 mg/dL    Alkaline Phosphatase 60 39 - 117 U/L   AST 16 0 - 37 U/L   ALT 13 0 - 35 U/L   Total Protein 6.4 6.0 - 8.3 g/dL   Albumin 4.0 3.5 - 5.2 g/dL   Calcium 9.4 8.4 - 09.6 mg/dL   GFR 04.54 >09.81 mL/min  Hemoglobin A1c  Result Value Ref Range   Hgb A1c MFr Bld 5.2 4.6 - 6.5 %  Lipid panel  Result Value Ref Range   Cholesterol 197 0 - 200 mg/dL   Triglycerides 191.4 0.0 - 149.0 mg/dL   HDL 78.29 >56.21 mg/dL   VLDL 30.8 0.0 - 65.7 mg/dL   LDL Cholesterol 846 (H) 0 - 99 mg/dL   Total CHOL/HDL Ratio 3    NonHDL 130.28   TSH  Result Value Ref Range   TSH 3.75 0.35 - 4.50 uIU/mL

## 2018-02-23 ENCOUNTER — Encounter: Payer: Self-pay | Admitting: Family Medicine

## 2018-02-23 ENCOUNTER — Ambulatory Visit (INDEPENDENT_AMBULATORY_CARE_PROVIDER_SITE_OTHER): Payer: Medicare Other | Admitting: Family Medicine

## 2018-02-23 VITALS — BP 126/72 | HR 75 | Temp 97.6°F | Resp 16 | Ht 67.0 in | Wt 116.0 lb

## 2018-02-23 DIAGNOSIS — Z1329 Encounter for screening for other suspected endocrine disorder: Secondary | ICD-10-CM

## 2018-02-23 DIAGNOSIS — I10 Essential (primary) hypertension: Secondary | ICD-10-CM

## 2018-02-23 DIAGNOSIS — Z636 Dependent relative needing care at home: Secondary | ICD-10-CM

## 2018-02-23 DIAGNOSIS — H6981 Other specified disorders of Eustachian tube, right ear: Secondary | ICD-10-CM

## 2018-02-23 DIAGNOSIS — E782 Mixed hyperlipidemia: Secondary | ICD-10-CM | POA: Diagnosis not present

## 2018-02-23 DIAGNOSIS — Z0000 Encounter for general adult medical examination without abnormal findings: Secondary | ICD-10-CM | POA: Diagnosis not present

## 2018-02-23 DIAGNOSIS — Z23 Encounter for immunization: Secondary | ICD-10-CM

## 2018-02-23 DIAGNOSIS — Z131 Encounter for screening for diabetes mellitus: Secondary | ICD-10-CM

## 2018-02-23 LAB — COMPREHENSIVE METABOLIC PANEL
ALT: 13 U/L (ref 0–35)
AST: 16 U/L (ref 0–37)
Albumin: 4 g/dL (ref 3.5–5.2)
Alkaline Phosphatase: 60 U/L (ref 39–117)
BILIRUBIN TOTAL: 0.4 mg/dL (ref 0.2–1.2)
BUN: 11 mg/dL (ref 6–23)
CO2: 27 mEq/L (ref 19–32)
Calcium: 9.4 mg/dL (ref 8.4–10.5)
Chloride: 102 mEq/L (ref 96–112)
Creatinine, Ser: 0.7 mg/dL (ref 0.40–1.20)
GFR: 83.85 mL/min (ref >60.00)
Glucose, Bld: 93 mg/dL (ref 70–99)
Potassium: 3.8 mEq/L (ref 3.5–5.1)
Sodium: 138 mEq/L (ref 135–145)
Total Protein: 6.4 g/dL (ref 6.0–8.3)

## 2018-02-23 LAB — CBC
HCT: 41.8 % (ref 36.0–46.0)
Hemoglobin: 14 g/dL (ref 12.0–15.0)
MCHC: 33.6 g/dL (ref 30.0–36.0)
MCV: 96.9 fl (ref 78.0–100.0)
Platelets: 250 10*3/uL (ref 150.0–400.0)
RBC: 4.31 Mil/uL (ref 3.87–5.11)
RDW: 13.4 % (ref 11.5–15.5)
WBC: 4.9 10*3/uL (ref 4.0–10.5)

## 2018-02-23 LAB — TSH: TSH: 3.75 u[IU]/mL (ref 0.35–4.50)

## 2018-02-23 LAB — HEMOGLOBIN A1C: Hgb A1c MFr Bld: 5.2 % (ref 4.6–6.5)

## 2018-02-23 LAB — LIPID PANEL
CHOL/HDL RATIO: 3
Cholesterol: 197 mg/dL (ref 0–200)
HDL: 66.3 mg/dL (ref >39.00)
LDL Cholesterol: 107 mg/dL — ABNORMAL HIGH (ref 0–99)
NonHDL: 130.28
TRIGLYCERIDES: 118 mg/dL (ref 0.0–149.0)
VLDL: 23.6 mg/dL (ref 0.0–40.0)

## 2018-02-23 MED ORDER — AMLODIPINE BESYLATE 5 MG PO TABS: 10.0000 mg | ORAL_TABLET | Freq: Every day | ORAL | 3 refills | Status: DC

## 2018-02-23 MED ORDER — PREDNISONE 20 MG PO TABS: ORAL_TABLET | ORAL | 0 refills | Status: DC

## 2018-02-23 MED ORDER — DULOXETINE HCL 30 MG PO CPEP: 90.0000 mg | ORAL_CAPSULE | Freq: Every day | ORAL | 11 refills | Status: DC

## 2018-02-23 NOTE — Patient Instructions (Addendum)
We will set you up for a cologuard test to screen for colon cancer; you should get the kit by mail Pneumonia vaccine- you got your first one today, and we will do your next shot in a year You can also have the shingles vaccine- shingrix- at your drug store  Perhaps follow-up with neurology to discuss your memory concerns  [facility]  [address]  [place]  [place], [place] [address]  [phone]    I would conserve your Fioricet for headache only; this is a controlled substance so we want you to use as little as you can You can add plain tylenol to your routine voltaren for joint pains as needed  I will be in touch with your labs asap; let me know if you need anything else   Health Maintenance After Age 50 After age 45, you are at a higher risk for certain long-term diseases and infections as well as injuries from falls. Falls are a major cause of broken bones and head injuries in people who are older than age 19. Getting regular preventive care can help to keep you healthy and well. Preventive care includes getting regular testing and making lifestyle changes as recommended by your health care provider. Talk with your health care provider about:  Which screenings and tests you should have. A screening is a test that checks for a disease when you have no symptoms.  A diet and exercise plan that is right for you. What should I know about screenings and tests to prevent falls? Screening and testing are the best ways to find a health problem early. Early diagnosis and treatment give you the best chance of managing medical conditions that are common after age 24. Certain conditions and lifestyle choices may make you more likely to have a fall. Your health care provider may recommend:  Regular vision checks. Poor vision and conditions such as cataracts can make you more likely to have a fall. If you wear glasses, make sure to get your  prescription updated if your vision changes.  Medicine review. Work with your health care provider to regularly review all of the medicines you are taking, including over-the-counter medicines. Ask your health care provider about any side effects that may make you more likely to have a fall. Tell your health care provider if any medicines that you take make you feel dizzy or sleepy.  Osteoporosis screening. Osteoporosis is a condition that causes the bones to get weaker. This can make the bones weak and cause them to break more easily.  Blood pressure screening. Blood pressure changes and medicines to control blood pressure can make you feel dizzy.  Strength and balance checks. Your health care provider may recommend certain tests to check your strength and balance while standing, walking, or changing positions.  Foot health exam. Foot pain and numbness, as well as not wearing proper footwear, can make you more likely to have a fall.  Depression screening. You may be more likely to have a fall if you have a fear of falling, feel emotionally low, or feel unable to do activities that you used to do.  Alcohol use screening. Using too much alcohol can affect your balance and may make you more likely to have a fall. What actions can I take to lower my risk of falls? General instructions  Talk with your health care provider about your risks for falling. Tell your health care provider if: ? You fall. Be  sure to tell your health care provider about all falls, even ones that seem minor. ? You feel dizzy, sleepy, or off-balance.  Take over-the-counter and prescription medicines only as told by your health care provider. These include any supplements.  Eat a healthy diet and maintain a healthy weight. A healthy diet includes low-fat dairy products, low-fat (lean) meats, and fiber from whole grains, beans, and lots of fruits and vegetables. Home safety  Remove any tripping hazards, such as rugs,  cords, and clutter.  Install safety equipment such as grab bars in bathrooms and safety rails on stairs.  Keep rooms and walkways well-lit. Activity   Follow a regular exercise program to stay fit. This will help you maintain your balance. Ask your health care provider what types of exercise are appropriate for you.  If you need a cane or walker, use it as recommended by your health care provider.  Wear supportive shoes that have nonskid soles. Lifestyle  Do not drink alcohol if your health care provider tells you not to drink.  If you drink alcohol, limit how much you have: ? 0-1 drink a day for women. ? 0-2 drinks a day for men.  Be aware of how much alcohol is in your drink. In the [place] one drink equals one typical bottle of beer (12 oz), one-half glass of wine (5 oz), or one shot of hard liquor (1 oz).  Do not use any products that contain nicotine or tobacco, such as cigarettes and e-cigarettes. If you need help quitting, ask your health care provider. Summary  Having a healthy lifestyle and getting preventive care can help to protect your health and wellness after age 25.  Screening and testing are the best way to find a health problem early and help you avoid having a fall. Early diagnosis and treatment give you the best chance for managing medical conditions that are more common for people who are older than age 22.  Falls are a major cause of broken bones and head injuries in people who are older than age 49. Take precautions to prevent a fall at home.  Work with your health care provider to learn what changes you can make to improve your health and wellness and to prevent falls. This information is not intended to replace advice given to you by your health care provider. Make sure you discuss any questions you have with your health care provider. Document Released: 11/24/2016 Document Revised: 11/24/2016 Document Reviewed: 11/24/2016 Elsevier Interactive Patient  Education  2019 [employer].

## 2018-02-27 ENCOUNTER — Encounter: Payer: Self-pay | Admitting: Family Medicine

## 2018-02-27 ENCOUNTER — Telehealth: Payer: Self-pay | Admitting: Family Medicine

## 2018-02-27 DIAGNOSIS — H6981 Other specified disorders of Eustachian tube, right ear: Secondary | ICD-10-CM

## 2018-02-27 NOTE — Telephone Encounter (Signed)
Please advise 

## 2018-02-27 NOTE — Telephone Encounter (Signed)
Copied from CRM [id number]. Topic: General - Other >> Feb 27, 2018  3:25 PM Jilda Roche wrote: Reason for CRM: Patient was seen a few weeks ago and still has fluid in her ears, she has 1 more dose of her Prednisone and she is going to [place] next Monday, please advise if she needs another antibiotic or needs to be seen before she goes away  Best call back is [phone]

## 2018-02-28 NOTE — Addendum Note (Signed)
Addended by: Abbe Amsterdam C on: 02/28/2018 02:23 PM   Modules accepted: Orders

## 2018-03-03 ENCOUNTER — Other Ambulatory Visit: Payer: Self-pay | Admitting: Family Medicine

## 2018-03-03 DIAGNOSIS — I10 Essential (primary) hypertension: Secondary | ICD-10-CM

## 2018-03-03 DIAGNOSIS — E039 Hypothyroidism, unspecified: Secondary | ICD-10-CM

## 2018-03-07 LAB — COLOGUARD: Cologuard: NEGATIVE

## 2018-03-09 ENCOUNTER — Telehealth: Payer: Self-pay | Admitting: *Deleted

## 2018-03-09 ENCOUNTER — Encounter: Payer: Self-pay | Admitting: Family Medicine

## 2018-03-09 NOTE — Telephone Encounter (Signed)
Received cologuard results via portal. Results negative, abstracted and forwarded to PCP for review / scan.

## 2018-03-09 NOTE — Telephone Encounter (Signed)
Ok, message to pt.  Will scan paper report

## 2018-04-14 ENCOUNTER — Other Ambulatory Visit: Payer: Self-pay | Admitting: Family Medicine

## 2018-04-14 DIAGNOSIS — I10 Essential (primary) hypertension: Secondary | ICD-10-CM

## 2018-05-11 ENCOUNTER — Other Ambulatory Visit: Payer: Self-pay | Admitting: Family Medicine

## 2018-05-11 DIAGNOSIS — M159 Polyosteoarthritis, unspecified: Secondary | ICD-10-CM

## 2018-05-11 DIAGNOSIS — M150 Primary generalized (osteo)arthritis: Principal | ICD-10-CM

## 2018-06-23 ENCOUNTER — Telehealth: Payer: Self-pay | Admitting: Family Medicine

## 2018-06-23 ENCOUNTER — Encounter: Payer: Self-pay | Admitting: Family Medicine

## 2018-06-23 DIAGNOSIS — Z636 Dependent relative needing care at home: Secondary | ICD-10-CM

## 2018-06-23 MED ORDER — CYMBALTA 60 MG PO CPEP: 60.0000 mg | ORAL_CAPSULE | Freq: Every day | ORAL | 3 refills | Status: DC

## 2018-06-23 NOTE — Telephone Encounter (Signed)
Copied from CRM [id number]. Topic: Quick Communication - Rx Refill/Question >> Jun 23, 2018 12:44 PM Fanny Bien wrote: Medication: DULoxetine (CYMBALTA) 30 MG capsule [[id number]]  pt states that she would like brand name called in. Pt states that she thinks that she will only have to take 60 mg if she can take the brand name. Please advise   Has the patient contacted their pharmacy? no Preferred Pharmacy (with phone number or street name):[facility] - [place], [place] - [address] [place] [address] Phone: [phone] Fax: [phone] Not a 24 hour pharmacy; exact hours not known.    Agent: Please be advised that RX refills may take up to 3 business days. We ask that you follow-up with your pharmacy.

## 2018-06-23 NOTE — Telephone Encounter (Signed)
Copied from CRM #[id number]. Topic: Quick Communication - Rx Refill/Question °>> Jun 23, 2018 12:44 PM Ilderton, Jessica L wrote: °Medication: DULoxetine (CYMBALTA) 30 MG capsule [[id number]]  pt states that she would like brand name called in. Pt states that she thinks that she will only have to take 60 mg if she can take the brand name. Please advise  ° °Has the patient contacted their pharmacy? no °Preferred Pharmacy (with phone number or street name):[facility] - [place], [place] - [address] °[address]  [place] [address] °Phone: [phone] Fax: [phone] °Not a 24 hour pharmacy; exact hours not known. ° ° ° °Agent: Please be advised that RX refills may take up to 3 business days. We ask that you follow-up with your pharmacy. °

## 2018-06-23 NOTE — Telephone Encounter (Signed)
Ok to switch to brand

## 2018-09-13 ENCOUNTER — Other Ambulatory Visit: Payer: Self-pay | Admitting: Family Medicine

## 2018-09-13 DIAGNOSIS — I10 Essential (primary) hypertension: Secondary | ICD-10-CM

## 2018-11-27 ENCOUNTER — Other Ambulatory Visit: Payer: Self-pay | Admitting: Family Medicine

## 2018-11-27 DIAGNOSIS — M159 Polyosteoarthritis, unspecified: Secondary | ICD-10-CM

## 2018-11-27 NOTE — Telephone Encounter (Signed)
Kristy Arias from [facility] called in to increase Patients mg for the following medication CYMBALTA 60 MG capsule [[id number]]   Patient was decreased from 90 to 60 mg however patient like to increase back to 90mg . Patient has already picked up medication from pharmacy of 60mg  and would like to another prescription filled for the extra 30 mg .    Please advise   Pharmacy [place], [place] - [place] [phone]

## 2018-12-14 ENCOUNTER — Other Ambulatory Visit: Payer: Self-pay | Admitting: Family Medicine

## 2018-12-14 DIAGNOSIS — E039 Hypothyroidism, unspecified: Secondary | ICD-10-CM

## 2018-12-23 ENCOUNTER — Other Ambulatory Visit: Payer: Self-pay | Admitting: Family Medicine

## 2018-12-23 DIAGNOSIS — I10 Essential (primary) hypertension: Secondary | ICD-10-CM

## 2018-12-29 ENCOUNTER — Emergency Department (HOSPITAL_BASED_OUTPATIENT_CLINIC_OR_DEPARTMENT_OTHER): Payer: Medicare Other

## 2018-12-29 ENCOUNTER — Other Ambulatory Visit: Payer: Self-pay

## 2018-12-29 ENCOUNTER — Ambulatory Visit: Payer: Self-pay

## 2018-12-29 ENCOUNTER — Encounter (HOSPITAL_BASED_OUTPATIENT_CLINIC_OR_DEPARTMENT_OTHER): Payer: Self-pay | Admitting: *Deleted

## 2018-12-29 ENCOUNTER — Emergency Department (HOSPITAL_BASED_OUTPATIENT_CLINIC_OR_DEPARTMENT_OTHER)
Admission: EM | Admit: 2018-12-29 | Discharge: 2018-12-29 | Disposition: A | Discharge status: Home / Self Care | Payer: Medicare Other | Attending: Emergency Medicine | Admitting: Emergency Medicine

## 2018-12-29 DIAGNOSIS — Z20828 Contact with and (suspected) exposure to other viral communicable diseases: Secondary | ICD-10-CM | POA: Diagnosis not present

## 2018-12-29 DIAGNOSIS — E039 Hypothyroidism, unspecified: Secondary | ICD-10-CM | POA: Diagnosis not present

## 2018-12-29 DIAGNOSIS — I10 Essential (primary) hypertension: Secondary | ICD-10-CM | POA: Insufficient documentation

## 2018-12-29 DIAGNOSIS — R0602 Shortness of breath: Secondary | ICD-10-CM | POA: Diagnosis not present

## 2018-12-29 DIAGNOSIS — Z79899 Other long term (current) drug therapy: Secondary | ICD-10-CM | POA: Insufficient documentation

## 2018-12-29 DIAGNOSIS — Z20822 Contact with and (suspected) exposure to covid-19: Secondary | ICD-10-CM

## 2018-12-29 DIAGNOSIS — Z96653 Presence of artificial knee joint, bilateral: Secondary | ICD-10-CM | POA: Insufficient documentation

## 2018-12-29 LAB — CBC WITH DIFFERENTIAL/PLATELET
Abs Immature Granulocytes: 0.02 10*3/uL (ref 0.00–0.07)
Basophils Absolute: 0.1 10*3/uL (ref 0.0–0.1)
Basophils Relative: 1 %
Eosinophils Absolute: 0.1 10*3/uL (ref 0.0–0.5)
Eosinophils Relative: 1 %
HCT: 47.7 % — ABNORMAL HIGH (ref 36.0–46.0)
Hemoglobin: 15.5 g/dL — ABNORMAL HIGH (ref 12.0–15.0)
Immature Granulocytes: 0 %
Lymphocytes Relative: 21 %
Lymphs Abs: 1.9 10*3/uL (ref 0.7–4.0)
MCH: 33 pg (ref 26.0–34.0)
MCHC: 32.5 g/dL (ref 30.0–36.0)
MCV: 101.7 fL — ABNORMAL HIGH (ref 80.0–100.0)
Monocytes Absolute: 0.8 10*3/uL (ref 0.1–1.0)
Monocytes Relative: 9 %
Neutro Abs: 6.4 10*3/uL (ref 1.7–7.7)
Neutrophils Relative %: 68 %
Platelets: 301 10*3/uL (ref 150–400)
RBC: 4.69 MIL/uL (ref 3.87–5.11)
RDW: 12.5 % (ref 11.5–15.5)
WBC: 9.2 10*3/uL (ref 4.0–10.5)
nRBC: 0 % (ref 0.0–0.2)

## 2018-12-29 LAB — SARS CORONAVIRUS 2 AG (30 MIN TAT): SARS Coronavirus 2 Ag: NEGATIVE

## 2018-12-29 LAB — COMPREHENSIVE METABOLIC PANEL
ALT: 13 U/L (ref 0–44)
AST: 16 U/L (ref 15–41)
Albumin: 3.9 g/dL (ref 3.5–5.0)
Alkaline Phosphatase: 62 U/L (ref 38–126)
Anion gap: 9 (ref 5–15)
BUN: 15 mg/dL (ref 8–23)
CO2: 23 mmol/L (ref 22–32)
Calcium: 9.1 mg/dL (ref 8.9–10.3)
Chloride: 105 mmol/L (ref 98–111)
Creatinine, Ser: 0.66 mg/dL (ref 0.44–1.00)
GFR calc Af Amer: >60 mL/min (ref >60)
GFR calc non Af Amer: >60 mL/min (ref >60)
Glucose, Bld: 104 mg/dL — ABNORMAL HIGH (ref 70–99)
Potassium: 3.8 mmol/L (ref 3.5–5.1)
Sodium: 137 mmol/L (ref 135–145)
Total Bilirubin: 0.4 mg/dL (ref 0.3–1.2)
Total Protein: 6.7 g/dL (ref 6.5–8.1)

## 2018-12-29 LAB — BRAIN NATRIURETIC PEPTIDE: B Natriuretic Peptide: 136.6 pg/mL — ABNORMAL HIGH (ref 0.0–100.0)

## 2018-12-29 LAB — TROPONIN I (HIGH SENSITIVITY): Troponin I (High Sensitivity): 8 ng/L (ref <18)

## 2018-12-29 LAB — D-DIMER, QUANTITATIVE: D-Dimer, Quant: 0.57 ug/mL-FEU — ABNORMAL HIGH (ref 0.00–0.50)

## 2018-12-29 NOTE — ED Provider Notes (Signed)
[facility] EMERGENCY DEPARTMENT Provider Note   CSN: [id number] Arrival date & time: 12/29/18  1527     History   Chief Complaint Chief Complaint  Patient presents with  . Shortness of Breath    HPI Kristy Arias is a 68 y.o. female.     The history is provided by the patient, the spouse and medical records. No language interpreter was used.  Shortness of Breath Severity:  Moderate Onset quality:  Gradual Duration:  1 day Timing:  Intermittent Progression:  Waxing and waning Chronicity:  New Context: activity   Relieved by:  Nothing Worsened by:  Exertion Ineffective treatments:  None tried Associated symptoms: no abdominal pain, no chest pain, no cough, no diaphoresis, no fever, no headaches, no neck pain, no rash, no sputum production, no vomiting and no wheezing   Risk factors: no hx of PE/DVT     Past Medical History:  Diagnosis Date  . Anemia   . Anxiety   . Arthritis   . Depression   . GERD (gastroesophageal reflux disease)   . Glaucoma   . Headache   . Heart murmur   . Hyperlipidemia   . Hypertension   . Hypothyroidism   . Shortness of breath    syndrome x  . Syndrome X, cardiac [facility])     Patient Active Problem List   Diagnosis Date Noted  . Chest pain 12/01/2016  . OA (osteoarthritis) of knee 08/25/2015  . Memory loss 06/05/2014  . Chronic migraine without aura without status migrainosus, not intractable 06/05/2014  . Chronic cholecystitis with calculus s/p lap chole 06/29/2012 04/13/2012  . Heartburn 04/13/2012  . Hypertension 11/02/2011  . Fibromuscular dysplasia (HCC) 11/02/2011  . Hyperlipidemia 11/02/2011  . Allergic rhinitis 11/02/2011  . Arthritis 11/02/2011  . Depression 11/02/2011    Past Surgical History:  Procedure Laterality Date  . ABDOMINAL HYSTERECTOMY    . BREAST ENHANCEMENT SURGERY    . BREAST SURGERY    . CARDIAC CATHETERIZATION  4/14  . CHOLECYSTECTOMY    . COSMETIC SURGERY    . DILATION AND CURETTAGE  OF UTERUS  2013  . EYE SURGERY    . LAPAROSCOPIC CHOLECYSTECTOMY SINGLE PORT N/A 06/29/2012   Procedure: LAPAROSCOPIC CHOLECYSTECTOMY SINGLE PORT IOC ;  Surgeon: Ardeth Sportsman, MD;  Location: [facility] ORS;  Service: General;  Laterality: N/A;  . LEFT HEART CATHETERIZATION WITH CORONARY ANGIOGRAM N/A 05/23/2012   Procedure: LEFT HEART CATHETERIZATION WITH CORONARY ANGIOGRAM;  Surgeon: [name], MD;  Location[facility] CATH LAB;  Service: Cardiovascular;  Laterality: N/A;  . TOTAL KNEE ARTHROPLASTY Right 08/25/2015   Procedure: RIGHT TOTAL KNEE ARTHROPLASTY;  Surgeon: Ollen Gross, MD;  Location: [facility] ORS;  Service: Orthopedics;  Laterality: Right;  . TOTAL KNEE ARTHROPLASTY Left 08/09/2016   Procedure: LEFT TOTAL KNEE ARTHROPLASTY;  Surgeon: Ollen Gross, MD;  Location: [facility] ORS;  Service: Orthopedics;  Laterality: Left;     OB History   No obstetric history on file.      Home Medications    Prior to Admission medications   Medication Sig Start Date End Date Taking? Authorizing Provider  ALPRAZolam Prudy Feeler) 0.5 MG tablet Take 1 tablet (0.5 mg total) by mouth at bedtime as needed for anxiety. Patient taking differently: Take 0.5 mg by mouth 3 (three) times daily as needed for anxiety.  01/28/15   Collene Gobble, MD  amLODipine (NORVASC) 5 MG tablet TAKE 2 TABLETS BY MOUTH EVERY DAY 12/27/18   Copland, Gwenlyn Found, MD  calcium carbonate (TUMS - DOSED IN MG ELEMENTAL CALCIUM) 500 MG chewable tablet Chew 1 tablet by mouth 2 (two) times daily.    [provider]  cetirizine (ZYRTEC) 10 MG tablet Take 10 mg by mouth at bedtime.    [provider]  CYMBALTA 60 MG capsule Take 1 capsule (60 mg total) by mouth daily. 06/23/18   Copland, Gwenlyn Found, MD  diclofenac (VOLTAREN) 75 MG EC tablet TAKE 1 TABLET BY MOUTH 2 TIMES DAILY AS NEEDED FOR knee PAIN 11/28/18   Copland, Gwenlyn Found, MD  estradiol (VIVELLE-DOT) 0.1 MG/24HR patch Place 0.5 patches onto the skin 2 (two) times a week. Sunday & Thursday     [provider]  fluticasone (FLONASE) 50 MCG/ACT nasal spray Place 2 sprays into both nostrils daily.    [provider]  levothyroxine (SYNTHROID) 50 MCG tablet TAKE 1 TABLET BY MOUTH EVERY DAY 12/14/18   Copland, Gwenlyn Found, MD  losartan (COZAAR) 25 MG tablet TAKE 1 TABLET BY MOUTH EVERY DAY 12/27/18   Copland, Gwenlyn Found, MD  predniSONE (DELTASONE) 20 MG tablet Take 2 pills a day for 3 days, then 1 a day for 3 days 02/23/18   Copland, Gwenlyn Found, MD  rosuvastatin (CRESTOR) 5 MG tablet Take 1 tablet (5 mg total) by mouth at bedtime. Patient taking differently: Take 5 mg by mouth 4 (four) times a week. At bedtime. 01/28/15   Collene Gobble, MD  triazolam (HALCION) 0.25 MG tablet Take 0.25 mg by mouth daily. 12/09/17   [provider]    Family History Family History  Problem Relation Age of Onset  . Hypertension Father   . Diabetes Father   . Heart disease Father   . Hyperlipidemia Father   . Hypertension Maternal Grandmother   . Heart disease Maternal Grandmother   . Diabetes Maternal Grandfather   . Hyperlipidemia Maternal Grandfather   . Heart disease Maternal Grandfather   . Osteoporosis Paternal Grandmother   . Heart disease Paternal Grandmother   . Heart disease Paternal Grandfather   . Cancer Mother   . Hyperlipidemia Mother   . Hypertension Mother   . Cancer Sister     Social History Social History   Tobacco Use  . Smoking status: Never Smoker  . Smokeless tobacco: Never Used  Substance Use Topics  . Alcohol use: No  . Drug use: No     Allergies   Patient has no known allergies.   Review of Systems Review of Systems  Constitutional: Negative for chills, diaphoresis, fatigue and fever.  HENT: Negative for congestion.   Eyes: Negative for visual disturbance.  Respiratory: Positive for shortness of breath. Negative for cough, sputum production, choking, chest tightness, wheezing and stridor.   Cardiovascular: Negative for chest pain,  palpitations and leg swelling.  Gastrointestinal: Negative for abdominal pain, constipation, diarrhea, nausea and vomiting.  Genitourinary: Negative for dysuria and flank pain.  Musculoskeletal: Negative for back pain, neck pain and neck stiffness.  Skin: Negative for rash and wound.  Neurological: Negative for light-headedness, numbness and headaches.  Psychiatric/Behavioral: Negative for agitation.  All other systems reviewed and are negative.    Physical Exam Updated Vital Signs BP (!) 147/79   Pulse 83   Temp 97.8 F (36.6 C) (Oral)   Resp 16   Ht 5\' 2"  (1.575 m)   Wt 55.3 kg   SpO2 100%   BMI 22.31 kg/m   Physical Exam Vitals signs and nursing note reviewed.  Constitutional:  General: She is not in acute distress.    Appearance: She is well-developed and normal weight. She is not ill-appearing, toxic-appearing or diaphoretic.  HENT:     Head: Normocephalic and atraumatic.     Right Ear: External ear normal.     Left Ear: External ear normal.     Nose: Nose normal.     Mouth/Throat:     Mouth: Mucous membranes are moist.     Pharynx: No pharyngeal swelling or oropharyngeal exudate.  Eyes:     Extraocular Movements: Extraocular movements intact.     Conjunctiva/sclera: Conjunctivae normal.     Pupils: Pupils are equal, round, and reactive to light.  Neck:     Musculoskeletal: Normal range of motion and neck supple.  Cardiovascular:     Rate and Rhythm: Normal rate and regular rhythm.     Pulses: Normal pulses.     Heart sounds: No murmur.  Pulmonary:     Effort: No tachypnea or respiratory distress.     Breath sounds: No stridor. No decreased breath sounds, wheezing, rhonchi or rales.  Chest:     Chest wall: No tenderness.  Abdominal:     General: There is no distension.     Tenderness: There is no abdominal tenderness. There is no rebound.  Musculoskeletal:     Right lower leg: She exhibits no tenderness. No edema.     Left lower leg: She exhibits no  tenderness. No edema.  Skin:    General: Skin is warm.     Capillary Refill: Capillary refill takes less than 2 seconds.     Findings: No erythema or rash.  Neurological:     General: No focal deficit present.     Mental Status: She is alert and oriented to person, place, and time.     Motor: No abnormal muscle tone.     Coordination: Coordination normal.     Deep Tendon Reflexes: Reflexes are normal and symmetric.  Psychiatric:        Mood and Affect: Mood normal.      ED Treatments / Results  Labs (all labs ordered are listed, but only abnormal results are displayed) Labs Reviewed  CBC WITH DIFFERENTIAL/PLATELET - Abnormal; Notable for the following components:      Result Value   Hemoglobin 15.5 (*)    HCT 47.7 (*)    MCV 101.7 (*)    All other components within normal limits  COMPREHENSIVE METABOLIC PANEL - Abnormal; Notable for the following components:   Glucose, Bld 104 (*)    All other components within normal limits  BRAIN NATRIURETIC PEPTIDE - Abnormal; Notable for the following components:   B Natriuretic Peptide 136.6 (*)    All other components within normal limits  D-DIMER, QUANTITATIVE (NOT AT [facility] - Abnormal; Notable for the following components:   D-Dimer, Quant 0.57 (*)    All other components within normal limits  SARS CORONAVIRUS 2 AG (30 MIN TAT)  TROPONIN I (HIGH SENSITIVITY)    EKG EKG Interpretation  Date/Time:  Friday December 29 2018 15:37:16 EST Ventricular Rate:  86 PR Interval:  164 QRS Duration: 74 QT Interval:  356 QTC Calculation: 426 R Axis:   13 Text Interpretation: Normal sinus rhythm Right atrial enlargement Anterior infarct , age undetermined Abnormal ECG When compared to prior, no significant changes seen. NO STEMI Confirmed by Antony Blackbird [phone]) on 12/29/2018 4:33:51 PM   Radiology Dg Chest Portable 1 View  Result Date: 12/29/2018 CLINICAL DATA:  Shortness  of breath, dizziness, and palpitations for 1 day EXAM: PORTABLE  CHEST 1 VIEW COMPARISON:  Portable exam 2150 hours compared to 04/12/2014 FINDINGS: Normal heart size, mediastinal contours, and pulmonary vascularity. Lungs clear. No infiltrate, pleural effusion, or pneumothorax. Osseous structures unremarkable. BILATERAL breast prostheses. IMPRESSION: No acute abnormalities. Electronically Signed   By: Ulyses Southward M.D.   On: 12/29/2018 22:09    Procedures Procedures (including critical care time)  Kristy Arias was evaluated in Emergency Department on 12/29/2018 for the symptoms described in the history of present illness. She was evaluated in the context of the global COVID-19 pandemic, which necessitated consideration that the patient might be at risk for infection with the SARS-CoV-2 virus that causes COVID-19. Institutional protocols and algorithms that pertain to the evaluation of patients at risk for COVID-19 are in a state of rapid change based on information released by regulatory bodies including the CDC and federal and state organizations. These policies and algorithms were followed during the patient's care in the ED.    Medications Ordered in ED Medications - No data to display   Initial Impression / Assessment and Plan / ED Course  I have reviewed the triage vital signs and the nursing notes.  Pertinent labs & imaging results that were available during my care of the patient were reviewed by me and considered in my medical decision making (see chart for details).        Kristy Arias is a 68 y.o. female with a past medical history significant for hypertension, hyperlipidemia, and migraines who presents with shortness of breath.  She reports that today, she has had shortness of breath primarily with exertion.  No chest pain or palpitations.  No fevers, chills, congestion, or cough.  No recent trauma.  No urinary or GI symptoms.  She simply is having shortness of breath with exertion which she does not normally have.  She denies any history of  DVT or PE.  She reports that with the shortness of breath, both she and her husband went and got coronavirus tested earlier today and the test results are not back.  On exam, lungs are clear and chest is nontender.  Abdomen is nontender.  No crackles on exam.  Abdomen nontender.  Patient resting comfortably.  No lower extremity edema seen.  Good pulses and sensation in all extremities.  Good strength in extremities.  No focal neurologic deficits.  EKG shows no STEMI.  Patient had work-up to look for etiology of her exertional shortness of breath.  We did a D-dimer which was 0.57.  Troponin was negative after many hours of waiting.  Do not feel she needs a second troponin given the time before it was collected.  Labs showed slight elevation in BNP which patient was informed of.  Other labs overall reassuring.  Rapid Covid test was negative.  Had a shared decision-making conversation with patient and her husband about work-up and plan.  She was offered a CT PE study given the technically elevated D-dimer but they were comfortable using the age adjustment and considering a negative.  Patient was able to ambulate around the emergency department to the bathroom with no further shortness of breath or symptoms.  She is feeling better.  Unclear etiology of patient shortness of breath however given the overall reassuring work-up, we do not feel she needs admission at this time.  Patient reports she is scheduled see her cardiologist next week and can see her PCP before then.  She understands return  precautions for any new or worsening symptoms and that if she does need to come back, she would likely need to get a PE study.  Patient is agreeable to this plan and patient discharged in good condition.   Final Clinical Impressions(s) / ED Diagnoses   Final diagnoses:  Exertional shortness of breath    ED Discharge Orders    None     Clinical Impression: 1. Exertional shortness of breath     Disposition:  Discharge  Condition: Good  I have discussed the results, Dx and Tx plan with the pt(& family if present). He/she/they expressed understanding and agree(s) with the plan. Discharge instructions discussed at great length. Strict return precautions discussed and pt &/or family have verbalized understanding of the instructions. No further questions at time of discharge.    New Prescriptions   No medications on file    Follow Up: Copland, Gwenlyn FoundJessica C, MD [address] [place] [id number] [phone]     [facility] [address] [id number] [place] [id number] [phone]       Donnabelle Blanchard, Canary Brimhristopher J, MD 12/29/18 [phone]

## 2018-12-29 NOTE — Discharge Instructions (Signed)
Your work-up today is overall reassuring.  Your heart enzyme was negative.  Your chest x-ray did not show pneumonia or other abnormality.  Your BNP was slightly elevated as we discussed and your D-dimer was negative if we account for age adjustment.  We had a shared decision-making conversation together and agreed that you are safe to go home at this time to follow-up with your primary doctor and your cardiologist in the next week or 2.  Please rest and stay hydrated.  If any symptoms change or worsen, please return to nearest emergency department immediately.

## 2018-12-29 NOTE — Telephone Encounter (Signed)
FYI patient going to ER.  

## 2018-12-29 NOTE — Telephone Encounter (Signed)
   Reason for Disposition . MILD difficulty breathing (e.g., minimal/no SOB at rest, SOB with walking, pulse <100)  Answer Assessment - Initial Assessment Questions 1. COVID-19 DIAGNOSIS: "Who made your Coronavirus (COVID-19) diagnosis?" "Was it confirmed by a positive lab test?" If not diagnosed by a HCP, ask "Are there lots of cases (community spread) where you live?" (See public health department website, if unsure)     [place] 2. COVID-19 EXPOSURE: "Was there any known exposure to COVID before the symptoms began?" CDC Definition of close contact: within 6 feet (2 meters) for a total of 15 minutes or more over a 24-hour period.      unknown exposure 3. ONSET: "When did the COVID-19 symptoms start?"      During the night 4. WORST SYMPTOM: "What is your worst symptom?" (e.g., cough, fever, shortness of breath, muscle aches)    SOB dizziness 5. COUGH: "Do you have a cough?" If so, ask: "How bad is the cough?"      no 6. FEVER: "Do you have a fever?" If so, ask: "What is your temperature, how was it measured, and when did it start?"     no 7. RESPIRATORY STATUS: "Describe your breathing?" (e.g., shortness of breath, wheezing, unable to speak)      no 8. BETTER-SAME-WORSE: "Are you getting better, staying the same or getting worse compared to yesterday?"  If getting worse, ask, "In what way?"     same 9. HIGH RISK DISEASE: "Do you have any chronic medical problems?" (e.g., asthma, heart or lung disease, weak immune system, obesity, etc.)    syndrom X lung condition 10. PREGNANCY: "Is there any chance you are pregnant?" "When was your last menstrual period?"       *No Answer* 11. OTHER SYMPTOMS: "Do you have any other symptoms?"  (e.g., chills, fatigue, headache, loss of smell or taste, muscle pain, sore throat; new loss of smell or taste especially support the diagnosis of COVID-19)     Headache, fatigue, joint pain,no loss of taste or smell  Protocols used: CORONAVIRUS (COVID-19)  DIAGNOSED OR SUSPECTED-A-AH

## 2018-12-29 NOTE — Telephone Encounter (Signed)
Patient called stating that last night she woke from sleep SOB with dizziness.  She states that she has just been to our testing site and tested for COVID-19.  She states that she had no tightness in her chest.  She does not wheeze or cough. She is only SOB with activity. She has Hx of syndrome X.  She has headache and has felt extra tired. Care advice read to patient. She verbalized understanding. Call was place to office per protocol at patient request. Patient will go to ER for evaluation of SOB.

## 2018-12-29 NOTE — ED Triage Notes (Addendum)
Pt c/o SOB and dizziness, at palpitations  x 1 day COVID test done today pending

## 2018-12-30 LAB — NOVEL CORONAVIRUS, NAA: SARS-CoV-2, NAA: NOT DETECTED

## 2019-01-01 ENCOUNTER — Ambulatory Visit: Payer: Medicare Other | Admitting: Cardiology

## 2019-01-04 ENCOUNTER — Ambulatory Visit (INDEPENDENT_AMBULATORY_CARE_PROVIDER_SITE_OTHER): Payer: Medicare Other | Admitting: Cardiology

## 2019-01-04 ENCOUNTER — Other Ambulatory Visit: Payer: Self-pay

## 2019-01-04 ENCOUNTER — Encounter: Payer: Self-pay | Admitting: Cardiology

## 2019-01-04 VITALS — BP 137/88 | HR 79 | Temp 97.3°F | Ht 62.0 in | Wt 121.0 lb

## 2019-01-04 DIAGNOSIS — I10 Essential (primary) hypertension: Secondary | ICD-10-CM

## 2019-01-04 DIAGNOSIS — R0789 Other chest pain: Secondary | ICD-10-CM

## 2019-01-04 DIAGNOSIS — R0609 Other forms of dyspnea: Secondary | ICD-10-CM

## 2019-01-04 DIAGNOSIS — R0600 Dyspnea, unspecified: Secondary | ICD-10-CM

## 2019-01-04 DIAGNOSIS — E782 Mixed hyperlipidemia: Secondary | ICD-10-CM | POA: Diagnosis not present

## 2019-01-04 MED ORDER — EZETIMIBE 10 MG PO TABS: 10.0000 mg | ORAL_TABLET | Freq: Every day | ORAL | 1 refills | Status: DC

## 2019-01-04 NOTE — Progress Notes (Signed)
Primary Physician:  Pearline Cablesopland, Jessica C, MD   Patient ID: Kristy Arias, female    DOB: 06/11/1950, 68 y.o.   MRN: [id number]  Subjective:    Chief Complaint  Patient presents with  . Hypertension  . Follow-up    1 year    HPI: Kristy Arias  is a 68 y.o. female  with hypertension, hypothyroidism, and hyperlipidemia last seen 1 year ago.  She has had abnormal treadmill stress test in 2014, in which she underwent coronary angiogram that revealed normal coronary arteries. Felt to likely have endothelial dysfunction. She has chronic dyspnea on exertion. Recently on 12/04, was seen in the ER for shortness of breath. ACS was excluded. Had minimal elevation in BNP and D-Dimer. CTA was not performed as she was felt to be low risk for PE. Dyspnea had improved by the time she was evaluated in the ER.  She states that dyspnea is essentially back to her baseline. She does have intermittent episodes of left-sided chest pain that can occur for several minutes at a time unrelated to exertion. No leg edema, PND, or orthopnea.   She reports blood pressure is well controlled. She does mention that her lipids could be better, admits to only taking Crestor 2-3 times a week to avoid side effects.   No former tobacco use. Does have a family history of heart disease in her paternal and maternal grandparents, but not at early age.    Past Medical History:  Diagnosis Date  . Anemia   . Anxiety   . Arthritis   . Depression   . GERD (gastroesophageal reflux disease)   . Glaucoma   . Headache   . Heart murmur   . Hyperlipidemia   . Hypertension   . Hypothyroidism   . Shortness of breath    syndrome x  . Syndrome X, cardiac [facility](HCC)     Past Surgical History:  Procedure Laterality Date  . ABDOMINAL HYSTERECTOMY    . BREAST ENHANCEMENT SURGERY    . BREAST SURGERY    . CARDIAC CATHETERIZATION  4/14  . CHOLECYSTECTOMY    . COSMETIC SURGERY    . DILATION AND CURETTAGE OF UTERUS  2013  . EYE  SURGERY    . LAPAROSCOPIC CHOLECYSTECTOMY SINGLE PORT N/A 06/29/2012   Procedure: LAPAROSCOPIC CHOLECYSTECTOMY SINGLE PORT IOC ;  Surgeon: Ardeth SportsmanSteven C. Gross, MD;  Location: [facility];  Service: General;  Laterality: N/A;  . LEFT HEART CATHETERIZATION WITH CORONARY ANGIOGRAM N/A 05/23/2012   Procedure: LEFT HEART CATHETERIZATION WITH CORONARY ANGIOGRAM;  Surgeon: Pamella PertJagadeesh R Ganji, MD;  Location: [facility] C[facility] LAB;  Service: Cardiovascular;  Laterality: N/A;  . TOTAL KNEE ARTHROPLASTY Right 08/25/2015   Procedure: RIGHT TOTAL KNEE ARTHROPLASTY;  Surgeon: Ollen GrossFrank Aluisio, MD;  Location: [facility];  Service: Orthopedics;  Laterality: Right;  . TOTAL KNEE ARTHROPLASTY Left 08/09/2016   Procedure: LEFT TOTAL KNEE ARTHROPLASTY;  Surgeon: Ollen GrossAluisio, Frank, MD;  Location: [facility];  Service: Orthopedics;  Laterality: Left;    Social History   Socioeconomic History  . Marital status: Married    Spouse name: Not on file  . Number of children: 2  . Years of education: Not on file  . Highest education level: Not on file  Occupational History  . Not on file  Tobacco Use  . Smoking status: Never Smoker  . Smokeless tobacco: Never Used  Substance and Sexual Activity  . Alcohol use: No  . Drug use: No  . Sexual activity: Yes  Birth control/protection: Surgical  Other Topics Concern  . Not on file  Social History Narrative  . Not on file   Social Determinants of Health   Financial Resource Strain:   . Difficulty of Paying Living Expenses: Not on file  Food Insecurity:   . Worried About Programme researcher, broadcasting/film/video in the Last Year: Not on file  . Ran Out of Food in the Last Year: Not on file  Transportation Needs:   . Lack of Transportation (Medical): Not on file  . Lack of Transportation (Non-Medical): Not on file  Physical Activity:   . Days of Exercise per Week: Not on file  . Minutes of Exercise per Session: Not on file  Stress:   . Feeling of Stress : Not on file  Social Connections:   . Frequency of  Communication with Friends and Family: Not on file  . Frequency of Social Gatherings with Friends and Family: Not on file  . Attends Religious Services: Not on file  . Active Member of Clubs or Organizations: Not on file  . Attends Banker Meetings: Not on file  . Marital Status: Not on file  Intimate Partner Violence:   . Fear of Current or Ex-Partner: Not on file  . Emotionally Abused: Not on file  . Physically Abused: Not on file  . Sexually Abused: Not on file    Review of Systems  Constitution: Negative for decreased appetite, malaise/fatigue, weight gain and weight loss.  Eyes: Negative for visual disturbance.  Cardiovascular: Positive for dyspnea on exertion. Negative for chest pain, claudication, leg swelling, orthopnea, palpitations and syncope.  Respiratory: Negative for hemoptysis and wheezing.   Endocrine: Negative for cold intolerance and heat intolerance.  Hematologic/Lymphatic: Does not bruise/bleed easily.  Skin: Negative for nail changes.  Musculoskeletal: Negative for muscle weakness and myalgias.  Gastrointestinal: Negative for abdominal pain, change in bowel habit, nausea and vomiting.  Neurological: Negative for difficulty with concentration, dizziness, focal weakness and headaches.  Psychiatric/Behavioral: Negative for altered mental status and suicidal ideas.  All other systems reviewed and are negative.     Objective:  Blood pressure 137/88, pulse 79, temperature (!) 97.3 F (36.3 C), height  (1.575 m), weight 121 lb (54.9 kg), SpO2 99 %. Body mass index is 22.13 kg/m.    Physical Exam  Constitutional: She is oriented to person, place, and time. Vital signs are normal. She appears well-developed and well-nourished.  HENT:  Head: Normocephalic and atraumatic.  Cardiovascular: Normal rate, regular rhythm, normal heart sounds and intact distal pulses.  Pulmonary/Chest: Effort normal and breath sounds normal. No accessory muscle usage. No  respiratory distress.  Abdominal: Soft. Bowel sounds are normal.  Musculoskeletal:        General: Normal range of motion.     Cervical back: Normal range of motion.  Neurological: She is alert and oriented to person, place, and time.  Skin: Skin is warm and dry.  Vitals reviewed.  Radiology: No results found.  Laboratory examination:    CMP Latest Ref Rng & Units 12/29/2018 02/23/2018 02/23/2017  Glucose 70 - 99 mg/dL 161(W) 93 960(A)  BUN 8 - 23 mg/dL Creatinine 0.44 - 1.00 mg/dL 5.40 9.81 1.91  Sodium 135 - 145 mmol/L 137 138 139  Potassium 3.5 - 5.1 mmol/L 3.8 3.8 3.6  Chloride 98 - 111 mmol/L 105 102 104  CO2 22 - 32 mmol/L Calcium 8.9 - 10.3 mg/dL 9.1 9.4 8.9  Total  Protein 6.5 - 8.1 g/dL 6.7 6.4 6.6  Total Bilirubin 0.3 - 1.2 mg/dL 0.4 0.4 0.4  Alkaline Phos 38 - 126 U/L 62 60 82  AST 15 - 41 U/L 16 16 10   ALT 0 - 44 U/L 13 13 8    CBC Latest Ref Rng & Units 12/29/2018 02/23/2018 10/29/2016  WBC 4.0 - 10.5 K/uL 9.2 4.9 4.8  Hemoglobin 12.0 - 15.0 g/dL 15.5(H) 14.0 13.3  Hematocrit 36.0 - 46.0 % 47.7(H) 41.8 40.7  Platelets 150 - 400 K/uL 301 250.0 278.0   Lipid Panel     Component Value Date/Time   CHOL 197 02/23/2018 1120   TRIG 118.0 02/23/2018 1120   HDL 66.30 02/23/2018 1120   CHOLHDL 3 02/23/2018 1120   VLDL 23.6 02/23/2018 1120   LDLCALC 107 (H) 02/23/2018 1120   HEMOGLOBIN A1C Lab Results  Component Value Date   HGBA1C 5.2 02/23/2018   MPG 100 08/04/2016   TSH Recent Labs    02/23/18 1120  TSH 3.75    PRN Meds:. Medications Discontinued During This Encounter  Medication Reason  . predniSONE (DELTASONE) 20 MG tablet Error   Current Meds  Medication Sig  . ALPRAZolam (XANAX) 0.5 MG tablet Take 1 tablet (0.5 mg total) by mouth at bedtime as needed for anxiety. (Patient taking differently: Take 0.5 mg by mouth 3 (three) times daily as needed for anxiety. )  . amLODipine (NORVASC) 5 MG tablet TAKE 2 TABLETS BY MOUTH EVERY DAY   . calcium carbonate (TUMS - DOSED IN MG ELEMENTAL CALCIUM) 500 MG chewable tablet Chew 1 tablet by mouth 2 (two) times daily.  . cetirizine (ZYRTEC) 10 MG tablet Take 10 mg by mouth at bedtime.  . CYMBALTA 60 MG capsule Take 1 capsule (60 mg total) by mouth daily.  . diclofenac (VOLTAREN) 75 MG EC tablet TAKE 1 TABLET BY MOUTH 2 TIMES DAILY AS NEEDED FOR knee PAIN  . estradiol (VIVELLE-DOT) 0.1 MG/24HR patch Place 1 patch onto the skin 2 (two) times a week. Sunday & Thursday  . levothyroxine (SYNTHROID) 50 MCG tablet TAKE 1 TABLET BY MOUTH EVERY DAY  . losartan (COZAAR) 25 MG tablet TAKE 1 TABLET BY MOUTH EVERY DAY  . rosuvastatin (CRESTOR) 5 MG tablet Take 1 tablet (5 mg total) by mouth at bedtime. (Patient taking differently: Take 5 mg by mouth 4 (four) times a week. At bedtime.)  . triazolam (HALCION) 0.25 MG tablet Take 0.25 mg by mouth daily.    Cardiac Studies:     Assessment:   Atypical chest pain - Plan: PCV MYOCARDIAL PERFUSION WITH LEXISCAN  Dyspnea on exertion - Plan: PCV ECHOCARDIOGRAM COMPLETE, PCV MYOCARDIAL PERFUSION WITH LEXISCAN  Essential hypertension - Plan: EKG 12-Lead  Mixed hyperlipidemia  EKG 01/04/2019: Normal sinus rhythm at 80 bpm, atrial couplet, normal axis, PRWP cannot exclude anterior infarct old. T wave flattening in anteroseptal leads, cannot exclude ischemia.   Recommendations:   I have reviewed the records from the emergency room, she recently had an episode of worsening dyspnea on exertion.  She continues to have some dyspnea on exertion that is chronic for her.  She does have intermittent episodes of left-sided chest discomfort not associated with exertion.  BNP was slightly elevated in the ER.  She has not had stress testing or echocardiogram since 2014.  Given her recent episode and symptoms, will obtain echocardiogram to exclude any structural abnormalities.  We will also obtain Lexiscan nuclear stress testing given atypical chest pain.  We are not  currently performing treadmill stress test in the office due to pandemic.    Her blood pressure was slightly elevated today, but is generally well controlled.  Will reevaluate at her next office visit.  She does have slightly elevated lipids, is only able to tolerate taking Crestor 2-3 times a week.  She would benefit from addition of Zetia, will start Zetia 10 mg daily.  I will see her back after the test for further recommendations and reevaluation.  Miquel Dunn, MSN, APRN, FNP-C [facility] [place] Office: [phone] Fax: [phone]

## 2019-01-11 ENCOUNTER — Ambulatory Visit (INDEPENDENT_AMBULATORY_CARE_PROVIDER_SITE_OTHER): Payer: Medicare Other

## 2019-01-11 ENCOUNTER — Other Ambulatory Visit: Payer: Self-pay

## 2019-01-11 DIAGNOSIS — R0600 Dyspnea, unspecified: Secondary | ICD-10-CM

## 2019-01-11 DIAGNOSIS — R0609 Other forms of dyspnea: Secondary | ICD-10-CM | POA: Diagnosis not present

## 2019-01-24 ENCOUNTER — Ambulatory Visit (INDEPENDENT_AMBULATORY_CARE_PROVIDER_SITE_OTHER): Payer: Medicare Other

## 2019-01-24 ENCOUNTER — Other Ambulatory Visit: Payer: Self-pay

## 2019-01-24 DIAGNOSIS — R0789 Other chest pain: Secondary | ICD-10-CM

## 2019-01-24 DIAGNOSIS — R0609 Other forms of dyspnea: Secondary | ICD-10-CM | POA: Diagnosis not present

## 2019-01-24 DIAGNOSIS — R0600 Dyspnea, unspecified: Secondary | ICD-10-CM

## 2019-02-01 ENCOUNTER — Ambulatory Visit (INDEPENDENT_AMBULATORY_CARE_PROVIDER_SITE_OTHER): Payer: Medicare Other | Admitting: Cardiology

## 2019-02-01 ENCOUNTER — Other Ambulatory Visit: Payer: Self-pay

## 2019-02-01 ENCOUNTER — Encounter: Payer: Self-pay | Admitting: Cardiology

## 2019-02-01 VITALS — BP 134/77 | HR 78 | Ht 62.5 in | Wt 122.1 lb

## 2019-02-01 DIAGNOSIS — R0609 Other forms of dyspnea: Secondary | ICD-10-CM | POA: Diagnosis not present

## 2019-02-01 DIAGNOSIS — R0600 Dyspnea, unspecified: Secondary | ICD-10-CM

## 2019-02-01 DIAGNOSIS — E782 Mixed hyperlipidemia: Secondary | ICD-10-CM | POA: Diagnosis not present

## 2019-02-01 DIAGNOSIS — I10 Essential (primary) hypertension: Secondary | ICD-10-CM | POA: Diagnosis not present

## 2019-02-01 DIAGNOSIS — R0789 Other chest pain: Secondary | ICD-10-CM | POA: Diagnosis not present

## 2019-02-01 NOTE — Progress Notes (Signed)
Primary Physician:  Darreld Mclean, MD   Patient ID: Kristy Arias, female    DOB: Mar 04, 1950, 69 y.o.   MRN: [id number]  Subjective:    Chief Complaint  Patient presents with  . Hyperlipidemia  . Chest Pain  . Shortness of Breath  . Follow-up    4wk    HPI: Kristy Arias  is a 69 y.o. female  with hypertension, hypothyroidism, and hyperlipidemia last seen 4 weeks ago after having an episode of shortness of breath.  Due to her recent episode and occasional left-sided chest pain, underwent Lexiscan nuclear stress test and echocardiogram and now presents to discuss results.  She has had abnormal treadmill stress test in 2014, in which she underwent coronary angiogram that revealed normal coronary arteries. Felt to likely have endothelial dysfunction. She has chronic dyspnea on exertion and feels symptoms are back to her baseline. Overall feels well. Still has intermittent chest discomfort at rest on occasion that does not prevent her from doing activities.   She reports blood pressure is well controlled. She was started on Zetia at her last visit as her lipids had elevated slightly due to her only taking Simvastatin a few days a week. She wishes to continue to only take a few days a week to prevent side effects. Admits that her diet could be a lot better. She also states that she could certainly exercise.    No former tobacco use. Does have a family history of heart disease in her paternal and maternal grandparents, but not at early age.   Past Medical History:  Diagnosis Date  . Anemia   . Anxiety   . Arthritis   . Depression   . GERD (gastroesophageal reflux disease)   . Glaucoma   . Headache   . Heart murmur   . Hyperlipidemia   . Hypertension   . Hypothyroidism   . Shortness of breath    syndrome x  . Syndrome X, cardiac [facility])     Past Surgical History:  Procedure Laterality Date  . ABDOMINAL HYSTERECTOMY    . BREAST ENHANCEMENT SURGERY    . BREAST SURGERY      . CARDIAC CATHETERIZATION  4/14  . CHOLECYSTECTOMY    . COSMETIC SURGERY    . DILATION AND CURETTAGE OF UTERUS  2013  . EYE SURGERY    . LAPAROSCOPIC CHOLECYSTECTOMY SINGLE PORT N/A 06/29/2012   Procedure: LAPAROSCOPIC CHOLECYSTECTOMY SINGLE PORT IOC ;  Surgeon: Adin Hector, MD;  Location: [facility];  Service: General;  Laterality: N/A;  . LEFT HEART CATHETERIZATION WITH CORONARY ANGIOGRAM N/A 05/23/2012   Procedure: LEFT HEART CATHETERIZATION WITH CORONARY ANGIOGRAM;  Surgeon: Laverda Page, MD;  Location: [facility] CATH LAB;  Service: Cardiovascular;  Laterality: N/A;  . TOTAL KNEE ARTHROPLASTY Right 08/25/2015   Procedure: RIGHT TOTAL KNEE ARTHROPLASTY;  Surgeon: Gaynelle Arabian, MD;  Location: [facility];  Service: Orthopedics;  Laterality: Right;  . TOTAL KNEE ARTHROPLASTY Left 08/09/2016   Procedure: LEFT TOTAL KNEE ARTHROPLASTY;  Surgeon: Gaynelle Arabian, MD;  Location: [facility];  Service: Orthopedics;  Laterality: Left;    Social History   Socioeconomic History  . Marital status: Married    Spouse name: Not on file  . Number of children: 2  . Years of education: Not on file  . Highest education level: Not on file  Occupational History  . Not on file  Tobacco Use  . Smoking status: Never Smoker  . Smokeless tobacco: Never Used  Substance  and Sexual Activity  . Alcohol use: No  . Drug use: No  . Sexual activity: Yes    Birth control/protection: Surgical  Other Topics Concern  . Not on file  Social History Narrative  . Not on file   Social Determinants of Health   Financial Resource Strain:   . Difficulty of Paying Living Expenses: Not on file  Food Insecurity:   . Worried About Programme researcher, broadcasting/film/video in the Last Year: Not on file  . Ran Out of Food in the Last Year: Not on file  Transportation Needs:   . Lack of Transportation (Medical): Not on file  . Lack of Transportation (Non-Medical): Not on file  Physical Activity:   . Days of Exercise per Week: Not on file  . Minutes of  Exercise per Session: Not on file  Stress:   . Feeling of Stress : Not on file  Social Connections:   . Frequency of Communication with Friends and Family: Not on file  . Frequency of Social Gatherings with Friends and Family: Not on file  . Attends Religious Services: Not on file  . Active Member of Clubs or Organizations: Not on file  . Attends Banker Meetings: Not on file  . Marital Status: Not on file  Intimate Partner Violence:   . Fear of Current or Ex-Partner: Not on file  . Emotionally Abused: Not on file  . Physically Abused: Not on file  . Sexually Abused: Not on file    Review of Systems  Constitution: Negative for decreased appetite, malaise/fatigue, weight gain and weight loss.  Eyes: Negative for visual disturbance.  Cardiovascular: Positive for dyspnea on exertion. Negative for chest pain, claudication, leg swelling, orthopnea, palpitations and syncope.  Respiratory: Negative for hemoptysis and wheezing.   Endocrine: Negative for cold intolerance and heat intolerance.  Hematologic/Lymphatic: Does not bruise/bleed easily.  Skin: Negative for nail changes.  Musculoskeletal: Negative for muscle weakness and myalgias.  Gastrointestinal: Negative for abdominal pain, change in bowel habit, nausea and vomiting.  Neurological: Negative for difficulty with concentration, dizziness, focal weakness and headaches.  Psychiatric/Behavioral: Negative for altered mental status and suicidal ideas.  All other systems reviewed and are negative.     Objective:  Blood pressure 134/77, pulse 78, height 5' 2.5" (1.588 m), weight 122 lb 1.6 oz (55.4 kg), SpO2 97 %. Body mass index is 21.98 kg/m.    Physical Exam  Constitutional: She is oriented to person, place, and time. Vital signs are normal. She appears well-developed and well-nourished.  HENT:  Head: Normocephalic and atraumatic.  Cardiovascular: Normal rate, regular rhythm, normal heart sounds and intact distal  pulses.  Pulmonary/Chest: Effort normal and breath sounds normal. No accessory muscle usage. No respiratory distress.  Abdominal: Soft. Bowel sounds are normal.  Musculoskeletal:        General: Normal range of motion.     Cervical back: Normal range of motion.  Neurological: She is alert and oriented to person, place, and time.  Skin: Skin is warm and dry.  Vitals reviewed.  Radiology: No results found.  Laboratory examination:    CMP Latest Ref Rng & Units 12/29/2018 02/23/2018 02/23/2017  Glucose 70 - 99 mg/dL 240(X) 93 735(H)  BUN 8 - 23 mg/dL 15 11 10   Creatinine 0.44 - 1.00 mg/dL 2.99 2.42  Sodium 135 - 145 mmol/L 137 138 139  Potassium 3.5 - 5.1 mmol/L 3.8 3.8 3.6  Chloride 98 - 111 mmol/L 105 102 104  CO2 22 -  32 mmol/L 23 27 26   Calcium 8.9 - 10.3 mg/dL 9.1 9.4 8.9  Total Protein 6.5 - 8.1 g/dL 6.7 6.4 6.6  Total Bilirubin 0.3 - 1.2 mg/dL 0.4 0.4 0.4  Alkaline Phos 38 - 126 U/L 62 60 82  AST 15 - 41 U/L 16 16 10   ALT 0 - 44 U/L 13 13 8    CBC Latest Ref Rng & Units 12/29/2018 02/23/2018 10/29/2016  WBC 4.0 - 10.5 K/uL 9.2 4.9 4.8  Hemoglobin 12.0 - 15.0 g/dL 15.5(H) 14.0 13.3  Hematocrit 36.0 - 46.0 % 47.7(H) 41.8 40.7  Platelets 150 - 400 K/uL 301 250.0 278.0   Lipid Panel     Component Value Date/Time   CHOL 197 02/23/2018 1120   TRIG 118.0 02/23/2018 1120   HDL 66.30 02/23/2018 1120   CHOLHDL 3 02/23/2018 1120   VLDL 23.6 02/23/2018 1120   LDLCALC 107 (H) 02/23/2018 1120   HEMOGLOBIN A1C Lab Results  Component Value Date   HGBA1C 5.2 02/23/2018   MPG 100 08/04/2016   TSH Recent Labs    02/23/18 1120  TSH 3.75    PRN Meds:. Medications Discontinued During This Encounter  Medication Reason  . fluticasone (FLONASE) 50 MCG/ACT nasal spray Patient Preference   Current Meds  Medication Sig  . acetaminophen-codeine (TYLENOL #3) 300-30 MG tablet Take by mouth every 4 (four) hours as needed for moderate pain (headache).  . ALPRAZolam (XANAX) 0.5 MG  tablet Take 1 tablet (0.5 mg total) by mouth at bedtime as needed for anxiety. (Patient taking differently: Take 0.5 mg by mouth 3 (three) times daily as needed for anxiety. )  . amLODipine (NORVASC) 5 MG tablet TAKE 2 TABLETS BY MOUTH EVERY DAY  . calcium carbonate (TUMS - DOSED IN MG ELEMENTAL CALCIUM) 500 MG chewable tablet Chew 1 tablet by mouth 2 (two) times daily.  . cetirizine (ZYRTEC) 10 MG tablet Take 10 mg by mouth at bedtime.  . CYMBALTA 60 MG capsule Take 1 capsule (60 mg total) by mouth daily. (Patient taking differently: Take 90 mg by mouth daily. )  . diclofenac (VOLTAREN) 75 MG EC tablet TAKE 1 TABLET BY MOUTH 2 TIMES DAILY AS NEEDED FOR knee PAIN  . estradiol (VIVELLE-DOT) 0.1 MG/24HR patch Place 1 patch onto the skin 2 (two) times a week. Sunday & Thursday  . ezetimibe (ZETIA) 10 MG tablet Take 1 tablet (10 mg total) by mouth daily.  02/25/18 levothyroxine (SYNTHROID) 50 MCG tablet TAKE 1 TABLET BY MOUTH EVERY DAY  . losartan (COZAAR) 25 MG tablet TAKE 1 TABLET BY MOUTH EVERY DAY  . Melatonin 10 MG TABS Take by mouth at bedtime.  . rosuvastatin (CRESTOR) 5 MG tablet Take 1 tablet (5 mg total) by mouth at bedtime. (Patient taking differently: Take 5 mg by mouth 4 (four) times a week. At bedtime.)  . triazolam (HALCION) 0.25 MG tablet Take 0.25 mg by mouth as needed for sleep.     Cardiac Studies:   Echocardiogram 01/11/2019: Left ventricle cavity is normal in size. Mild concentric hypertrophy of the left ventricle. Normal LV systolic function with visual EF 50-55%. Normal global wall motion. Normal diastolic filling pattern.  Trileaflet aortic valve.  Trace aortic regurgitation. Mild prolapse of the mitral valve leaflets with mild (Grade I) eccentric mitral regurgitation. Mild tricuspid regurgitation.  Mild pulmonic regurgitation. No evidence of pulmonary hypertension. Compared to previous study in 2014, valvular regurgitation findings are new.  Lexiscan Tetrofosmin stress test  01/24/2019: No previous exam available for comparison.  Lexiscan/walking nuclear stress test performed using 1-day protocol. Stress EKG is non-diagnostic, as this is pharmacological stress test. Stress EKG showed sinus tachycardia at 99% MPHR, no ischemic changes.  Myocardial perfusion imaging is normal. Stress LVEF 20%.  High risk study due to reduced LVEF. Recommend clinical correlation.  Assessment:   Dyspnea on exertion  Atypical chest pain  Essential hypertension  Mixed hyperlipidemia  EKG 01/04/2019: Normal sinus rhythm at 80 bpm, atrial couplet, normal axis, PRWP cannot exclude anterior infarct old. T wave flattening in anteroseptal leads, cannot exclude ischemia.   Recommendations:   I have reviewed and discussed recent echocardiogram and nuclear stress test results with the patient. Although her stress test was considered high risk for reduced LVEF, she had normal myocardial perfusion. Echocardiogram also showed normal LVEF and normal wall motion. I suspect that her chest pain is of non-cardiac etiology. I would recommend continued primary/secondary prevention measures. We have discussed making needed dietary changes as well as starting regular exercise. I suspect that her dyspnea on exertion may improve with starting regular exercise. If she does not have improvement despite this, would consider further evaluation.  She is to follow-up with her PCP in February and will have lipids at that time.  Although her lipids are only minimally elevated, in view of poor dietary compliance at this time and only wanting to take Crestor a few days a week, I have recommend she continue with Zetia along with her Crestor to better control her lipids.  Could potentially consider discontinuing her Zetia if she makes needed dietary changes.  We will plan to see her back in approximately 3 months to see if she has had improvement in her symptoms with exercise and also to further discuss her lipids.      Toniann Fail, MSN, APRN, FNP-C [facility] [place] Office: [phone] Fax: [phone]

## 2019-02-16 ENCOUNTER — Ambulatory Visit: Payer: Medicare Other | Attending: Internal Medicine

## 2019-02-16 DIAGNOSIS — Z23 Encounter for immunization: Secondary | ICD-10-CM | POA: Insufficient documentation

## 2019-02-16 NOTE — Progress Notes (Signed)
   Covid-19 Vaccination Clinic  Name:  AMYRIA KOMAR    MRN: [id number] DOB: [birth year]  02/16/2019  Ms. Greenlaw was observed post Covid-19 immunization for 15 minutes without incidence. She was provided with Vaccine Information Sheet and instruction to access the V-Safe system.   Ms. Elderkin was instructed to call 911 with any severe reactions post vaccine: Marland Kitchen Difficulty breathing  . Swelling of your face and throat  . A fast heartbeat  . A bad rash all over your body  . Dizziness and weakness    Immunizations Administered    Name Date Dose VIS Date Route   Pfizer COVID-19 Vaccine 02/16/2019  8:38 AM 0.3 mL 01/05/2019 Intramuscular   Manufacturer: [employer], [employer]   Lot: [id number]   NDC: [address]-9

## 2019-02-26 DIAGNOSIS — E039 Hypothyroidism, unspecified: Secondary | ICD-10-CM | POA: Insufficient documentation

## 2019-02-26 NOTE — Patient Instructions (Addendum)
It was good to see you again today, I will be in touch with your labs as soon as possible I will refill your thyroid once we make sure no dose adjustment is needed Please get the shingrix series and your pneumonia booster (pneuomovax) once you have completed your covid vaccine series    Health Maintenance After Age 67 After age 67, you are at a higher risk for certain long-term diseases and infections as well as injuries from falls. Falls are a major cause of broken bones and head injuries in people who are older than age 36. Getting regular preventive care can help to keep you healthy and well. Preventive care includes getting regular testing and making lifestyle changes as recommended by your health care provider. Talk with your health care provider about:  Which screenings and tests you should have. A screening is a test that checks for a disease when you have no symptoms.  A diet and exercise plan that is right for you. What should I know about screenings and tests to prevent falls? Screening and testing are the best ways to find a health problem early. Early diagnosis and treatment give you the best chance of managing medical conditions that are common after age 72. Certain conditions and lifestyle choices may make you more likely to have a fall. Your health care provider may recommend:  Regular vision checks. Poor vision and conditions such as cataracts can make you more likely to have a fall. If you wear glasses, make sure to get your prescription updated if your vision changes.  Medicine review. Work with your health care provider to regularly review all of the medicines you are taking, including over-the-counter medicines. Ask your health care provider about any side effects that may make you more likely to have a fall. Tell your health care provider if any medicines that you take make you feel dizzy or sleepy.  Osteoporosis screening. Osteoporosis is a condition that causes the bones to  get weaker. This can make the bones weak and cause them to break more easily.  Blood pressure screening. Blood pressure changes and medicines to control blood pressure can make you feel dizzy.  Strength and balance checks. Your health care provider may recommend certain tests to check your strength and balance while standing, walking, or changing positions.  Foot health exam. Foot pain and numbness, as well as not wearing proper footwear, can make you more likely to have a fall.  Depression screening. You may be more likely to have a fall if you have a fear of falling, feel emotionally low, or feel unable to do activities that you used to do.  Alcohol use screening. Using too much alcohol can affect your balance and may make you more likely to have a fall. What actions can I take to lower my risk of falls? General instructions  Talk with your health care provider about your risks for falling. Tell your health care provider if: ? You fall. Be sure to tell your health care provider about all falls, even ones that seem minor. ? You feel dizzy, sleepy, or off-balance.  Take over-the-counter and prescription medicines only as told by your health care provider. These include any supplements.  Eat a healthy diet and maintain a healthy weight. A healthy diet includes low-fat dairy products, low-fat (lean) meats, and fiber from whole grains, beans, and lots of fruits and vegetables. Home safety  Remove any tripping hazards, such as rugs, cords, and clutter.  Install safety equipment  such as grab bars in bathrooms and safety rails on stairs.  Keep rooms and walkways well-lit. Activity   Follow a regular exercise program to stay fit. This will help you maintain your balance. Ask your health care provider what types of exercise are appropriate for you.  If you need a cane or walker, use it as recommended by your health care provider.  Wear supportive shoes that have nonskid  soles. Lifestyle  Do not drink alcohol if your health care provider tells you not to drink.  If you drink alcohol, limit how much you have: ? 0-1 drink a day for women. ? 0-2 drinks a day for men.  Be aware of how much alcohol is in your drink. In the [place] one drink equals one typical bottle of beer (12 oz), one-half glass of wine (5 oz), or one shot of hard liquor (1 oz).  Do not use any products that contain nicotine or tobacco, such as cigarettes and e-cigarettes. If you need help quitting, ask your health care provider. Summary  Having a healthy lifestyle and getting preventive care can help to protect your health and wellness after age 71.  Screening and testing are the best way to find a health problem early and help you avoid having a fall. Early diagnosis and treatment give you the best chance for managing medical conditions that are more common for people who are older than age 67.  Falls are a major cause of broken bones and head injuries in people who are older than age 18. Take precautions to prevent a fall at home.  Work with your health care provider to learn what changes you can make to improve your health and wellness and to prevent falls. This information is not intended to replace advice given to you by your health care provider. Make sure you discuss any questions you have with your health care provider. Document Revised: 05/04/2018 Document Reviewed: 11/24/2016 Elsevier Patient Education  2020 [employer].

## 2019-02-26 NOTE — Progress Notes (Addendum)
[facility] [address], [address] [place], [place] [address] [phone]  Date:  02/28/2019   Name:  Kristy Arias   DOB:  [birth year]   MRN:  [id number]  PCP:  Pearline Cables, MD    Chief Complaint: Annual Exam (cymbalta) and Insomnia   History of Present Illness:  Kristy Arias is a 69 y.o. very pleasant female patient who presents with the following:  Here today for routine physical History of hypertension, migraine, hyperlipidemia, hypothyroidism Last seen by myself about 1 year ago Her health has been overall stable She did have dizziness and SOB in December - she was evaluated by cardiology, they added zetia  Alprazolam?  Per Dr Jennette Kettle - now taking halcion for sleep.  She tends to suffer from chronic insomnia Amlodipine 10 mg Cymbalta - she is taking 90 mg right now which works better for her anxiety but seems to worsen insomnia Vivelle-Dot per GYN Zetia Levothyroxine Losartan Crestor- not always taking  She uses tylenol #3 for her headaches   She has discussed her sleep issues and HA with neurology in the past, did not help her that much  Offered to refer her again, she declined for the time being  Married to [place] Has an adult daughter and 2 young grandchildren.  9 and 7 yo She and Annette Stable are currently alternating doing home school with her grandchildren, 2 days each week.  This has been a blessing but also stressful Helps to care for her elderly father- her mother did pass in June, she had severe dementia Her father is doing well and coping with the loss ok  She is a patient at [place] [facility], saw them last month for concern of dyspnea on exertion She had normal myocardial perfusion on recent nuclear stress They recommended that she continue Zetia and Crestor due to suboptimal lipid control  Bone density- per GYN Mammogram due-also per Dr Jennette Kettle Cologuard up-to-date Needs Pneumovax-however she is in the  process of getting her COVID-19 vaccine series so we will delay  Needs thyroid, lipid panel  Lab Results  Component Value Date   TSH 3.75 02/23/2018     Patient Active Problem List   Diagnosis Date Noted  . Hypothyroidism (acquired) 02/26/2019  . OA (osteoarthritis) of knee 08/25/2015  . Memory loss 06/05/2014  . Chronic migraine without aura without status migrainosus, not intractable 06/05/2014  . Chronic cholecystitis with calculus s/p lap chole 06/29/2012 04/13/2012  . Heartburn 04/13/2012  . Hypertension 11/02/2011  . Fibromuscular dysplasia (HCC) 11/02/2011  . Hyperlipidemia 11/02/2011  . Allergic rhinitis 11/02/2011  . Arthritis 11/02/2011  . Depression 11/02/2011    Past Medical History:  Diagnosis Date  . Anemia   . Anxiety   . Arthritis   . Depression   . GERD (gastroesophageal reflux disease)   . Glaucoma   . Headache   . Heart murmur   . Hyperlipidemia   . Hypertension   . Hypothyroidism   . Shortness of breath    syndrome x  . Syndrome X, cardiac [facility])     Past Surgical History:  Procedure Laterality Date  . ABDOMINAL HYSTERECTOMY    . BREAST ENHANCEMENT SURGERY    . BREAST SURGERY    . CARDIAC CATHETERIZATION  4/14  . CHOLECYSTECTOMY    . COSMETIC SURGERY    . DILATION AND CURETTAGE OF UTERUS  2013  . EYE SURGERY    . LAPAROSCOPIC CHOLECYSTECTOMY  SINGLE PORT N/A 06/29/2012   Procedure: LAPAROSCOPIC CHOLECYSTECTOMY SINGLE PORT IOC ;  Surgeon: Ardeth Sportsman, MD;  Location: [facility];  Service: General;  Laterality: N/A;  . LEFT HEART CATHETERIZATION WITH CORONARY ANGIOGRAM N/A 05/23/2012   Procedure: LEFT HEART CATHETERIZATION WITH CORONARY ANGIOGRAM;  Surgeon: [name], MD;  Location: [facility] CATH LAB;  Service: Cardiovascular;  Laterality: N/A;  . TOTAL KNEE ARTHROPLASTY Right 08/25/2015   Procedure: RIGHT TOTAL KNEE ARTHROPLASTY;  Surgeon: Ollen Gross, MD;  Location: [facility];  Service: Orthopedics;  Laterality: Right;  . TOTAL KNEE ARTHROPLASTY  Left 08/09/2016   Procedure: LEFT TOTAL KNEE ARTHROPLASTY;  Surgeon: Ollen Gross, MD;  [facility]: [facility];  Service: Orthopedics;  Laterality: Left;    Social History   Tobacco Use  . Smoking status: Never Smoker  . Smokeless tobacco: Never Used  Substance Use Topics  . Alcohol use: No  . Drug use: No    Family History  Problem Relation Age of Onset  . Hypertension Father   . Diabetes Father   . Heart disease Father   . Hyperlipidemia Father   . Hypertension Maternal Grandmother   . Heart disease Maternal Grandmother   . Diabetes Maternal Grandfather   . Hyperlipidemia Maternal Grandfather   . Heart disease Maternal Grandfather   . Osteoporosis Paternal Grandmother   . Heart disease Paternal Grandmother   . Heart disease Paternal Grandfather   . Cancer Mother   . Hyperlipidemia Mother   . Hypertension Mother   . Cancer Sister     No Known Allergies  Medication list has been reviewed and updated.  Current Outpatient Medications on File Prior to Visit  Medication Sig Dispense Refill  . acetaminophen-codeine (TYLENOL #3) 300-30 MG tablet Take by mouth every 4 (four) hours as needed for moderate pain (headache).    . ALPRAZolam (XANAX) 0.5 MG tablet Take 1 tablet (0.5 mg total) by mouth at bedtime as needed for anxiety. (Patient taking differently: Take 0.5 mg by mouth 3 (three) times daily as needed for anxiety. ) 30 tablet 2  . amLODipine (NORVASC) 5 MG tablet TAKE 2 TABLETS BY MOUTH EVERY DAY 60 tablet 0  . calcium carbonate (TUMS - DOSED IN MG ELEMENTAL CALCIUM) 500 MG chewable tablet Chew 1 tablet by mouth 2 (two) times daily.    . cetirizine (ZYRTEC) 10 MG tablet Take 10 mg by mouth at bedtime.    . CYMBALTA 60 MG capsule Take 1 capsule (60 mg total) by mouth daily. (Patient taking differently: Take 90 mg by mouth daily. ) 90 capsule 3  . diclofenac (VOLTAREN) 75 MG EC tablet TAKE 1 TABLET BY MOUTH 2 TIMES DAILY AS NEEDED FOR knee PAIN 180 tablet 1  . estradiol  (VIVELLE-DOT) 0.1 MG/24HR patch Place 1 patch onto the skin 2 (two) times a week. Sunday & Thursday    . ezetimibe (ZETIA) 10 MG tablet Take 1 tablet (10 mg total) by mouth daily. 30 tablet 1  . levothyroxine (SYNTHROID) 50 MCG tablet TAKE 1 TABLET BY MOUTH EVERY DAY 90 tablet 0  . losartan (COZAAR) 25 MG tablet TAKE 1 TABLET BY MOUTH EVERY DAY 30 tablet 0  . Melatonin 10 MG TABS Take by mouth at bedtime.    . rosuvastatin (CRESTOR) 5 MG tablet Take 1 tablet (5 mg total) by mouth at bedtime. (Patient taking differently: Take 5 mg by mouth 4 (four) times a week. At bedtime.) 30 tablet 11  . triazolam (HALCION) 0.25 MG tablet Take 0.25  mg by mouth as needed for sleep.      No current facility-administered medications on file prior to visit.    Review of Systems:  As per HPI- otherwise negative. No chest pain or shortness of breath She is exercising by bicycling and walking  Wt Readings from Last 3 Encounters:  02/28/19 121 lb (54.9 kg)  02/01/19 122 lb 1.6 oz (55.4 kg)  01/04/19 121 lb (54.9 kg)     Physical Examination: Vitals:   02/28/19 1013  BP: 122/74  Pulse: 89  Resp: 16  Temp: (!) 96.6 F (35.9 C)  SpO2: 97%   Vitals:   02/28/19 1013  Weight: 121 lb (54.9 kg)  Height: 5' 2.5" (1.588 m)   Body mass index is 21.78 kg/m. Ideal Body Weight: Weight in (lb) to have BMI = 25: 138.6  GEN: WDWN, NAD, Non-toxic, A & O x 3, petite build, looks well HEENT: Atraumatic, Normocephalic. Neck supple. No masses, No LAD.  TM within normal limits bilaterally Ears and Nose: No external deformity. CV: RRR, No M/G/R. No JVD. No thrill. No extra heart sounds. PULM: CTA B, no wheezes, crackles, rhonchi. No retractions. No resp. distress. No accessory muscle use. ABD: S, NT, ND, +BS. No rebound. No HSM. EXTR: No c/c/e NEURO Normal gait.  PSYCH: Normally interactive. Conversant. Not depressed or anxious appearing.  Calm demeanor.    Assessment and Plan: Physical  exam  Hypothyroidism (acquired) - Plan: TSH  Caregiver stress - Plan: DULoxetine (CYMBALTA) 30 MG capsule  Screening for diabetes mellitus - Plan: Hemoglobin A1c  Mixed hyperlipidemia - Plan: Lipid panel  Essential hypertension - Plan: amLODipine (NORVASC) 5 MG tablet, losartan (COZAAR) 25 MG tablet  Estrogen deficiency  Encounter for screening mammogram for malignant neoplasm of breast  Primary osteoarthritis involving multiple joints - Plan: diclofenac (VOLTAREN) 75 MG EC tablet  Here today for complete physical Mammogram and bone density per her GYN provider Labs pending as above She would like to increase Cymbalta to 90 mg, this is reasonable.  Changed prescription for her today Refill medications for blood pressure and Voltaren Will refill thyroid once her TSH comes in Discussed immunizations This visit occurred during the SARS-CoV-2 public health emergency.  Safety protocols were in place, including screening questions prior to the visit, additional usage of staff PPE, and extensive cleaning of exam room while observing appropriate contact time as indicated for disinfecting solutions.    Signed Lamar Blinks, MD  Received her labs, message to patient Refill thyroid at current dose Results for orders placed or performed in visit on 02/28/19  Hemoglobin A1c  Result Value Ref Range   Hgb A1c MFr Bld 5.3 4.6 - 6.5 %  Lipid panel  Result Value Ref Range   Cholesterol 178 0 - 200 mg/dL   Triglycerides 129.0 0.0 - 149.0 mg/dL   HDL 69.20 >39.00 mg/dL   VLDL 25.8 0.0 - 40.0 mg/dL   LDL Cholesterol 83 0 - 99 mg/dL   Total CHOL/HDL Ratio 3    NonHDL 108.47   TSH  Result Value Ref Range   TSH 2.69 0.35 - 4.50 uIU/mL

## 2019-02-27 ENCOUNTER — Other Ambulatory Visit: Payer: Self-pay

## 2019-02-28 ENCOUNTER — Encounter: Payer: Self-pay | Admitting: Family Medicine

## 2019-02-28 ENCOUNTER — Ambulatory Visit (INDEPENDENT_AMBULATORY_CARE_PROVIDER_SITE_OTHER): Payer: Medicare Other | Admitting: Family Medicine

## 2019-02-28 VITALS — BP 122/74 | HR 89 | Temp 96.6°F | Resp 16 | Ht 62.5 in | Wt 121.0 lb

## 2019-02-28 DIAGNOSIS — Z636 Dependent relative needing care at home: Secondary | ICD-10-CM | POA: Diagnosis not present

## 2019-02-28 DIAGNOSIS — Z0000 Encounter for general adult medical examination without abnormal findings: Secondary | ICD-10-CM | POA: Diagnosis not present

## 2019-02-28 DIAGNOSIS — M159 Polyosteoarthritis, unspecified: Secondary | ICD-10-CM

## 2019-02-28 DIAGNOSIS — E782 Mixed hyperlipidemia: Secondary | ICD-10-CM | POA: Diagnosis not present

## 2019-02-28 DIAGNOSIS — Z131 Encounter for screening for diabetes mellitus: Secondary | ICD-10-CM | POA: Diagnosis not present

## 2019-02-28 DIAGNOSIS — Z1231 Encounter for screening mammogram for malignant neoplasm of breast: Secondary | ICD-10-CM

## 2019-02-28 DIAGNOSIS — I10 Essential (primary) hypertension: Secondary | ICD-10-CM

## 2019-02-28 DIAGNOSIS — E039 Hypothyroidism, unspecified: Secondary | ICD-10-CM | POA: Diagnosis not present

## 2019-02-28 DIAGNOSIS — M8949 Other hypertrophic osteoarthropathy, multiple sites: Secondary | ICD-10-CM

## 2019-02-28 DIAGNOSIS — E2839 Other primary ovarian failure: Secondary | ICD-10-CM

## 2019-02-28 LAB — LIPID PANEL
Cholesterol: 178 mg/dL (ref 0–200)
HDL: 69.2 mg/dL (ref >39.00)
LDL Cholesterol: 83 mg/dL (ref 0–99)
NonHDL: 108.47
Total CHOL/HDL Ratio: 3
Triglycerides: 129 mg/dL (ref 0.0–149.0)
VLDL: 25.8 mg/dL (ref 0.0–40.0)

## 2019-02-28 LAB — TSH: TSH: 2.69 u[IU]/mL (ref 0.35–4.50)

## 2019-02-28 LAB — HEMOGLOBIN A1C: Hgb A1c MFr Bld: 5.3 % (ref 4.6–6.5)

## 2019-02-28 MED ORDER — DULOXETINE HCL 30 MG PO CPEP: 90.0000 mg | ORAL_CAPSULE | Freq: Every day | ORAL | 3 refills | Status: DC

## 2019-02-28 MED ORDER — DICLOFENAC SODIUM 75 MG PO TBEC: DELAYED_RELEASE_TABLET | ORAL | 1 refills | Status: DC

## 2019-02-28 MED ORDER — LOSARTAN POTASSIUM 25 MG PO TABS: 25.0000 mg | ORAL_TABLET | Freq: Every day | ORAL | 3 refills | Status: DC

## 2019-02-28 MED ORDER — AMLODIPINE BESYLATE 5 MG PO TABS: 10.0000 mg | ORAL_TABLET | Freq: Every day | ORAL | 3 refills | Status: DC

## 2019-02-28 MED ORDER — LEVOTHYROXINE SODIUM 50 MCG PO TABS: 50.0000 ug | ORAL_TABLET | Freq: Every day | ORAL | 3 refills | Status: DC

## 2019-02-28 NOTE — Addendum Note (Signed)
Addended by: Abbe Amsterdam C on: 02/28/2019 06:51 PM   Modules accepted: Orders

## 2019-03-08 ENCOUNTER — Other Ambulatory Visit: Payer: Self-pay | Admitting: Family Medicine

## 2019-03-09 ENCOUNTER — Ambulatory Visit: Payer: Medicare Other | Attending: Internal Medicine

## 2019-03-09 DIAGNOSIS — Z23 Encounter for immunization: Secondary | ICD-10-CM | POA: Insufficient documentation

## 2019-03-09 NOTE — Progress Notes (Signed)
   Covid-19 Vaccination Clinic  Name:  Kristy Arias    MRN: [id number] DOB: 12-Oct-1950  03/09/2019  Kristy Arias was observed post Covid-19 immunization for 15 minutes without incidence. She was provided with Vaccine Information Sheet and instruction to access the V-Safe system.   Kristy Arias was instructed to call 911 with any severe reactions post vaccine: Marland Kitchen Difficulty breathing  . Swelling of your face and throat  . A fast heartbeat  . A bad rash all over your body  . Dizziness and weakness    Immunizations Administered    Name Date Dose VIS Date Route   Pfizer COVID-19 Vaccine 03/09/2019  9:29 AM 0.3 mL 01/05/2019 Intramuscular   Manufacturer: [employer], [employer]   Lot: [id number]   NDC: [address]-2

## 2019-03-25 ENCOUNTER — Ambulatory Visit: Payer: Medicare Other

## 2019-04-18 ENCOUNTER — Ambulatory Visit: Payer: Medicare Other | Admitting: Cardiology

## 2019-05-02 ENCOUNTER — Ambulatory Visit: Payer: Medicare Other | Admitting: Cardiology

## 2019-05-21 NOTE — Progress Notes (Signed)
Primary Physician/Referring:  Darreld Mclean, MD  Patient ID: Kristy Arias, female    DOB: 1950-11-29, 69 y.o.   MRN: [id number]  Chief Complaint  Patient presents with  . DOE  . Hyperlipidemia  . Chest Pain    6 month f/u. Wanted to come in early at 3 months   HPI:    Kristy Arias  is a 69 y.o. Caucasian female  with hypertension, hypothyroidism, and hyperlipidemia, abnormal treadmill stress test in 2014 and coronary angiography normal at that time, due to recurrent chest pain she had undergone nuclear test test in December 2020 which was normal. Felt to likely have endothelial dysfunction.   She has chronic dyspnea on exertion and feels mild worsening dyspnea and made an earlier appointment to see [place]. Still has intermittent chest discomfort both at rest and with exertion as well.    She was started on Zetia at her last visit as her lipids had elevated slightly due to her only taking  Crestor 2 days a week due to fear of dementia.   Past Medical History:  Diagnosis Date  . Anemia   . Anxiety   . Arthritis   . Depression   . GERD (gastroesophageal reflux disease)   . Glaucoma   . Headache   . Heart murmur   . Hyperlipidemia   . Hypertension   . Hypothyroidism   . Shortness of breath    syndrome x  . Syndrome X, cardiac [facility])    Past Surgical History:  Procedure Laterality Date  . ABDOMINAL HYSTERECTOMY    . BREAST ENHANCEMENT SURGERY    . BREAST SURGERY    . CARDIAC CATHETERIZATION  4/14  . CHOLECYSTECTOMY    . COSMETIC SURGERY    . DILATION AND CURETTAGE OF UTERUS  2013  . EYE SURGERY    . LAPAROSCOPIC CHOLECYSTECTOMY SINGLE PORT N/A 06/29/2012   Procedure: LAPAROSCOPIC CHOLECYSTECTOMY SINGLE PORT IOC ;  Surgeon: Adin Hector, MD;  [facility];  Service: General;  Laterality: N/A;  . LEFT HEART CATHETERIZATION WITH CORONARY ANGIOGRAM N/A 05/23/2012   Procedure: LEFT HEART CATHETERIZATION WITH CORONARY ANGIOGRAM;  Surgeon: Laverda Page, MD;   Location: [facility] CATH LAB;  Service: Cardiovascular;  Laterality: N/A;  . TOTAL KNEE ARTHROPLASTY Right 08/25/2015   Procedure: RIGHT TOTAL KNEE ARTHROPLASTY;  Surgeon: Gaynelle Arabian, MD;  [facility];  Service: Orthopedics;  Laterality: Right;  . TOTAL KNEE ARTHROPLASTY Left 08/09/2016   Procedure: LEFT TOTAL KNEE ARTHROPLASTY;  Surgeon: Gaynelle Arabian, MD;  [facility];  Service: Orthopedics;  Laterality: Left;   Family History  Problem Relation Age of Onset  . Hypertension Father   . Diabetes Father   . Heart disease Father   . Hyperlipidemia Father   . Hypertension Maternal Grandmother   . Heart disease Maternal Grandmother   . Diabetes Maternal Grandfather   . Hyperlipidemia Maternal Grandfather   . Heart disease Maternal Grandfather   . Osteoporosis Paternal Grandmother   . Heart disease Paternal Grandmother   . Heart disease Paternal Grandfather   . Cancer Mother   . Hyperlipidemia Mother   . Hypertension Mother   . Cancer Sister     Social History   Tobacco Use  . Smoking status: Never Smoker  . Smokeless tobacco: Never Used  Substance Use Topics  . Alcohol use: No   Marital Status: Married  ROS  Review of Systems  Cardiovascular: Positive for chest pain and dyspnea on exertion. Negative for  leg swelling.  Gastrointestinal: Negative for melena.   Objective  Blood pressure 137/79, pulse 73, temperature 97.8 F (36.6 C), temperature source Temporal, resp. rate 15, height 5\' 2"  (1.575 m), weight 122 lb (55.3 kg), SpO2 98 %.  Vitals with BMI 05/23/2019 02/28/2019 02/01/2019  Height 5\' 2"  5' 2.5" 5' 2.5"  Weight 122 lbs 121 lbs 122 lbs 2 oz  BMI 22.31 21.76 21.96  Systolic 137 122 04/01/2019  Diastolic 79 74 77  Pulse 73 89 78     Physical Exam  Cardiovascular: Normal rate, regular rhythm and intact distal pulses. Exam reveals no gallop.  Murmur heard.  Early systolic murmur is present with a grade of 2/6 at the lower right sternal border. No leg edema, no JVD.    Pulmonary/Chest: Effort normal and breath sounds normal.  Abdominal: Soft. Bowel sounds are normal.   Laboratory examination:   Recent Labs    12/29/18 1848  NA 137  K 3.8  CL 105  CO2 23  GLUCOSE 104*  BUN 15  CREATININE 0.66  CALCIUM 9.1  GFRNONAA >60  GFRAA >60   CrCl cannot be calculated (Patient's most recent lab result is older than the maximum 21 days allowed.).  CMP Latest Ref Rng & Units 12/29/2018 02/23/2018 02/23/2017  Glucose 70 - 99 mg/dL 02/25/2018) 93 02/25/2017)  BUN 8 - 23 mg/dL 15 11 10   Creatinine 0.44 - 1.00 mg/dL 811(B 147(W  Sodium 135 - 145 mmol/L 137 138 139  Potassium 3.5 - 5.1 mmol/L 3.8 3.8 3.6  Chloride 98 - 111 mmol/L 105 102 104  CO2 22 - 32 mmol/L 23 27 26   Calcium 8.9 - 10.3 mg/dL 9.1 9.4 8.9  Total Protein 6.5 - 8.1 g/dL 6.7 6.4 6.6  Total Bilirubin 0.3 - 1.2 mg/dL 0.4 0.4 0.4  Alkaline Phos 38 - 126 U/L 62 60 82  AST 15 - 41 U/L 16 16 10   ALT 0 - 44 U/L 13 13 8    CBC Latest Ref Rng & Units 12/29/2018 02/23/2018 10/29/2016  WBC 4.0 - 10.5 K/uL 9.2 4.9 4.8  Hemoglobin 12.0 - 15.0 g/dL 15.5(H) 14.0 13.3  Hematocrit 36.0 - 46.0 % 47.7(H) 41.8 40.7  Platelets 150 - 400 K/uL 301 250.0 278.0   Lipid Panel     Component Value Date/Time   CHOL 178 02/28/2019 1045   TRIG 129.0 02/28/2019 1045   HDL 69.20 02/28/2019 1045   CHOLHDL 3 02/28/2019 1045   VLDL 25.8 02/28/2019 1045   LDLCALC 83 02/28/2019 1045    Ref Range & Units 1 yr ago  (02/23/18) 2 yr ago  (02/23/17) 2 yr ago  (07/14/16) 3 yr ago  (03/18/16) 4 yr ago  (01/28/15)  Cholesterol 0 - 200 mg/dL 04/28/2019  02/25/18 CM  02/25/17  CM  214High  CM  220High  R   Comment: ATP III Classification    Desirable: < 200 mg/dL        Borderline High: 200 - 239 mg/dL     High: > = 07/16/16 mg/dL  Triglycerides 0.0 - 03/20/16 mg/dL 03/28/15  657 CM  846  CM  127.0 CM  259High  R   Comment: Normal: <150 mg/dLBorderline High: 150 - 199 mg/dL  HDL 962XBMW mg/dL 413  244.0  102.7  253.6  75 R   VLDL  0.0 - 40.0 mg/dL 644.0HKVQ  >25.95  63.87  56.43  52High  R   LDL Cholesterol 0 - 99 mg/dL 32.95   75  222High   126High   93 R, CM   Total CHOL/HDL Ratio  3         HEMOGLOBIN A1C Lab Results  Component Value Date   HGBA1C 5.3 02/28/2019   MPG 100 08/04/2016   TSH Recent Labs    02/28/19 1045  TSH 2.69    Medications and allergies  No Known Allergies   Current Outpatient Medications  Medication Instructions  . acetaminophen-codeine (TYLENOL #3) 300-30 MG tablet Oral, Every 4 hours PRN  . ALPRAZolam (XANAX) 0.5 mg, Oral, At bedtime PRN  . amLODipine (NORVASC) 10 mg, Oral, Daily  . butalbital-acetaminophen-caffeine (FIORICET) 50-325-40 MG tablet 1 tablet, Oral, 2 times daily PRN  . calcium carbonate (TUMS - DOSED IN MG ELEMENTAL CALCIUM) 500 MG chewable tablet 1 tablet, Oral, 2 times daily  . cetirizine (ZYRTEC) 10 mg, Oral, Daily at bedtime  . diclofenac (VOLTAREN) 75 MG EC tablet TAKE 1 TABLET BY MOUTH 2 TIMES DAILY AS NEEDED FOR knee PAIN  . DULoxetine (CYMBALTA) 90 mg, Oral, Daily  . estradiol (VIVELLE-DOT) 0.1 MG/24HR patch 1 patch, Transdermal, 2 times weekly, Sunday & Thursday  . ezetimibe (ZETIA) 10 MG tablet TAKE 1 TABLET BY MOUTH DAILY  . levothyroxine (SYNTHROID) 50 mcg, Oral, Daily  . losartan (COZAAR) 25 mg, Oral, Daily  . Melatonin 10 MG TABS Oral, Daily at bedtime  . rosuvastatin (CRESTOR) 5 mg, Oral, Daily at bedtime  . triazolam (HALCION) 0.25 mg, Oral, As needed   Radiology:   No results found.  Cardiac Studies:   Echocardiogram 01/11/2019: Left ventricle cavity is normal in size. Mild concentric hypertrophy of the left ventricle. Normal LV systolic function with visual EF 50-55%. Normal global wall motion. Normal diastolic filling pattern.  Trileaflet aortic valve.  Trace aortic regurgitation. Mild prolapse of the mitral valve leaflets with mild (Grade I) eccentric mitral regurgitation. Mild tricuspid regurgitation.  Mild pulmonic regurgitation. No  evidence of pulmonary hypertension. Compared to previous study in 2014, valvular regurgitation findings are new.  Lexiscan Tetrofosmin stress test 01/24/2019: No previous exam available for comparison. Lexiscan/walking nuclear stress test performed using 1-day protocol. Stress EKG is non-diagnostic, as this is pharmacological stress test. Stress EKG showed sinus tachycardia at 99% MPHR, no ischemic changes.  Myocardial perfusion imaging is normal. Stress LVEF 20%.  High risk study due to reduced LVEF. Recommend clinical correlation.  EKG    EKG 05/23/2019: Sinus bradycardia at rate of 58 bpm with borderline first-degree AV block, left atrial abnormality, poor R wave progression, cannot exclude anteroseptal infarct old. T wave inversion in V1 to V3, cannot exclude anteroseptal ischemia.  No significant change from 03/06/2018.  Assessment     ICD-10-CM   1. Atypical chest pain  R07.89 EKG 12-Lead    CT CARDIAC SCORING    Cardiopulmonary exercise test  2. Dyspnea on exertion  R06.00 CT CARDIAC SCORING    Cardiopulmonary exercise test  3. Familial hypercholesteremia  E78.01 Cardiopulmonary exercise test  4. Essential hypertension  I10      No orders of the defined types were placed in this encounter.   There are no discontinued medications.  Recommendations:   Kristy Arias  is a 69 y.o. Caucasian female  with hypertension, hypothyroidism, and probably familial hyperlipidemia (07/14/2016: Total cholesterol 04/02/2012 and LDL 222), abnormal treadmill stress test in 2014 and coronary angiography normal at that time, due to recurrent chest pain she had undergone nuclear test test in December 2020 which was normal. Felt to likely have endothelial dysfunction.  She continues to have chest discomfort and also worsening dyspnea on exertion especially for the past 2 months or so, I am very concerned about her presentation, would like to set her up for coronary calcium scoring for stratification and  also set her up for cardiopulmonary stress test.  I reviewed her lipid profile testing, significant improvement from baseline LDL, although still LDL is not less than 70, I am happy with the present levels.  Advised her to continue Zetia and she will continue taking Crestor twice a week at low-dose due to the fear of dementia.  I will see her back after the test and make further recommendations.  Her blood pressure was elevated today, advised her to monitor her blood pressure at home on a regular basis prior to next office visit which I will review.  Kristy Decamp, MD, [facility] 05/23/2019, 3:29 PM [facility] [place] Office: [phone]

## 2019-05-23 ENCOUNTER — Other Ambulatory Visit: Payer: Self-pay

## 2019-05-23 ENCOUNTER — Encounter: Payer: Self-pay | Admitting: Cardiology

## 2019-05-23 ENCOUNTER — Ambulatory Visit: Payer: Medicare Other | Admitting: Cardiology

## 2019-05-23 VITALS — BP 137/79 | HR 73 | Temp 97.8°F | Resp 15 | Ht 62.0 in | Wt 122.0 lb

## 2019-05-23 DIAGNOSIS — E7801 Familial hypercholesterolemia: Secondary | ICD-10-CM

## 2019-05-23 DIAGNOSIS — R0600 Dyspnea, unspecified: Secondary | ICD-10-CM

## 2019-05-23 DIAGNOSIS — R0609 Other forms of dyspnea: Secondary | ICD-10-CM

## 2019-05-23 DIAGNOSIS — I10 Essential (primary) hypertension: Secondary | ICD-10-CM

## 2019-05-23 DIAGNOSIS — R0789 Other chest pain: Secondary | ICD-10-CM

## 2019-05-26 ENCOUNTER — Other Ambulatory Visit: Payer: Self-pay | Admitting: Family Medicine

## 2019-06-06 ENCOUNTER — Telehealth (HOSPITAL_COMMUNITY): Payer: Self-pay | Admitting: *Deleted

## 2019-06-06 NOTE — Telephone Encounter (Signed)
Contacted patient per order for Cardiopulmonary Exercise Test. Patient unavailable, left voicemail for patient to return call at [phone] and schedule CPX.  Will continue to try to schedule patient.   Lesia Hausen, MS, ACSM-RCEP Clinical Exercise Physiologist

## 2019-06-14 ENCOUNTER — Ambulatory Visit
Admission: RE | Admit: 2019-06-14 | Discharge: 2019-06-14 | Disposition: A | Discharge status: Home / Self Care | Payer: Medicare Other | Source: Ambulatory Visit | Attending: Cardiology | Admitting: Cardiology

## 2019-06-14 DIAGNOSIS — R0789 Other chest pain: Secondary | ICD-10-CM

## 2019-06-14 DIAGNOSIS — R0609 Other forms of dyspnea: Secondary | ICD-10-CM

## 2019-06-14 DIAGNOSIS — R0600 Dyspnea, unspecified: Secondary | ICD-10-CM

## 2019-06-16 NOTE — Progress Notes (Signed)
Just FYI. Trying to risk stratify her.  JG

## 2019-07-04 ENCOUNTER — Ambulatory Visit: Payer: Medicare Other | Admitting: Cardiology

## 2019-07-09 ENCOUNTER — Other Ambulatory Visit (HOSPITAL_COMMUNITY)
Admission: RE | Admit: 2019-07-09 | Discharge: 2019-07-09 | Disposition: A | Discharge status: Home / Self Care | Payer: Medicare Other | Source: Ambulatory Visit | Attending: Cardiology | Admitting: Cardiology

## 2019-07-09 DIAGNOSIS — Z01812 Encounter for preprocedural laboratory examination: Secondary | ICD-10-CM | POA: Insufficient documentation

## 2019-07-09 DIAGNOSIS — Z20822 Contact with and (suspected) exposure to covid-19: Secondary | ICD-10-CM | POA: Insufficient documentation

## 2019-07-09 LAB — SARS CORONAVIRUS 2 (TAT 6-24 HRS): SARS Coronavirus 2: NEGATIVE

## 2019-07-12 ENCOUNTER — Other Ambulatory Visit: Payer: Self-pay

## 2019-07-12 ENCOUNTER — Ambulatory Visit (HOSPITAL_COMMUNITY): Payer: Medicare Other | Attending: Cardiology

## 2019-07-12 DIAGNOSIS — R0600 Dyspnea, unspecified: Secondary | ICD-10-CM | POA: Insufficient documentation

## 2019-07-12 DIAGNOSIS — R0609 Other forms of dyspnea: Secondary | ICD-10-CM

## 2019-07-12 DIAGNOSIS — R0789 Other chest pain: Secondary | ICD-10-CM | POA: Diagnosis not present

## 2019-07-12 DIAGNOSIS — E7801 Familial hypercholesterolemia: Secondary | ICD-10-CM | POA: Diagnosis not present

## 2019-07-12 NOTE — Progress Notes (Signed)
Cardiopulmonary stress test 07/12/19: Exercise testing with gas exchange demonstrates normal functional capacity when compared to matched sedentary norms. There is no cardiopulmonary abnormality. Patient has an excessive HR response to exercise and given all other normal parameters, this could be improved with cardiovascular exercise training, however should be clinically followed.

## 2019-07-19 ENCOUNTER — Encounter: Payer: Self-pay | Admitting: Cardiology

## 2019-07-19 ENCOUNTER — Ambulatory Visit: Payer: Medicare Other | Admitting: Cardiology

## 2019-07-19 ENCOUNTER — Other Ambulatory Visit: Payer: Self-pay

## 2019-07-19 VITALS — BP 138/85 | HR 74 | Resp 17 | Ht 62.0 in | Wt 120.0 lb

## 2019-07-19 DIAGNOSIS — R0600 Dyspnea, unspecified: Secondary | ICD-10-CM

## 2019-07-19 DIAGNOSIS — I10 Essential (primary) hypertension: Secondary | ICD-10-CM

## 2019-07-19 DIAGNOSIS — R0609 Other forms of dyspnea: Secondary | ICD-10-CM

## 2019-07-19 DIAGNOSIS — E7801 Familial hypercholesterolemia: Secondary | ICD-10-CM

## 2019-07-19 MED ORDER — LOSARTAN POTASSIUM 50 MG PO TABS: 50.0000 mg | ORAL_TABLET | Freq: Every day | ORAL | 3 refills | Status: DC

## 2019-07-19 NOTE — Progress Notes (Signed)
Primary Physician/Referring:  Pearline Cables, MD  Patient ID: Kristy Arias, female    DOB: [birth year], 69 y.o.   MRN: [id number]  Chief Complaint  Patient presents with  . Chest Pain  . Hypertension  . Hyperlipidemia  . Follow-up    6 week  . Results    CPX and calcium    HPI:    Kristy Arias  is a 69 y.o. Caucasian female  with hypertension, hypothyroidism, and hyperlipidemia, abnormal treadmill stress test in 2014 and coronary angiography normal at that time, due to recurrent chest pain she had undergone nuclear test test in December 2020 which was normal. Felt to likely have endothelial dysfunction.   She has chronic dyspnea on exertion and feels mild worsening dyspnea and made an earlier appointment to see [place]. Still has intermittent chest discomfort both at rest and with exertion as well.  I seen her about 6 weeks ago and obtained a cardiopulmonary stress test and also coronary calcium score.  She now presents for follow-up.  No significant change in symptoms, she has not had any recurrence of chest pain.  She is on Zetia and Crestor 5 mg 2 days a week due to fear of dementia.   Past Medical History:  Diagnosis Date  . Anemia   . Anxiety   . Arthritis   . Depression   . GERD (gastroesophageal reflux disease)   . Glaucoma   . Headache   . Heart murmur   . Hyperlipidemia   . Hypertension   . Hypothyroidism   . Shortness of breath    syndrome x  . Syndrome X, cardiac [facility])    Past Surgical History:  Procedure Laterality Date  . ABDOMINAL HYSTERECTOMY    . BREAST ENHANCEMENT SURGERY    . BREAST SURGERY    . CARDIAC CATHETERIZATION  4/14  . CHOLECYSTECTOMY    . COSMETIC SURGERY    . DILATION AND CURETTAGE OF UTERUS  2013  . EYE SURGERY    . LAPAROSCOPIC CHOLECYSTECTOMY SINGLE PORT N/A 06/29/2012   Procedure: LAPAROSCOPIC CHOLECYSTECTOMY SINGLE PORT IOC ;  Surgeon: Ardeth Sportsman, MD;  Location: [facility];  Service: General;  Laterality: N/A;  . LEFT HEART  CATHETERIZATION WITH CORONARY ANGIOGRAM N/A 05/23/2012   Procedure: LEFT HEART CATHETERIZATION WITH CORONARY ANGIOGRAM;  Surgeon: [name], MD;  Location: [facility] CATH LAB;  Service: Cardiovascular;  Laterality: N/A;  . TOTAL KNEE ARTHROPLASTY Right 08/25/2015   Procedure: RIGHT TOTAL KNEE ARTHROPLASTY;  Surgeon: Ollen Gross, MD;  Location: [facility];  Service: Orthopedics;  Laterality: Right;  . TOTAL KNEE ARTHROPLASTY Left 08/09/2016   Procedure: LEFT TOTAL KNEE ARTHROPLASTY;  Surgeon: Ollen Gross, MD;  Location: [facility];  Service: Orthopedics;  Laterality: Left;   Family History  Problem Relation Age of Onset  . Hypertension Father   . Diabetes Father   . Heart disease Father   . Hyperlipidemia Father   . Hypertension Maternal Grandmother   . Heart disease Maternal Grandmother   . Diabetes Maternal Grandfather   . Hyperlipidemia Maternal Grandfather   . Heart disease Maternal Grandfather   . Osteoporosis Paternal Grandmother   . Heart disease Paternal Grandmother   . Heart disease Paternal Grandfather   . Cancer Mother   . Hyperlipidemia Mother   . Hypertension Mother   . Cancer Sister     Social History   Tobacco Use  . Smoking status: Never Smoker  . Smokeless tobacco: Never Used  Substance Use Topics  .  Alcohol use: No   Marital Status: Married  ROS  Review of Systems  Cardiovascular: Positive for chest pain and dyspnea on exertion. Negative for leg swelling.  Gastrointestinal: Negative for melena.   Objective  Blood pressure 138/85, pulse 74, resp. rate 17, height 5\' 2"  (1.575 m), weight 120 lb (54.4 kg), SpO2 97 %.  Vitals with BMI 07/19/2019 05/23/2019 02/28/2019  Height 5\' 2"  5\' 2"  5' 2.5"  Weight 120 lbs 122 lbs 121 lbs  BMI 21.94 32.20 25.42  Systolic 706 237 628  Diastolic 85 79 74  Pulse 74 73 89     Physical Exam Cardiovascular:     Rate and Rhythm: Normal rate and regular rhythm.     Pulses: Intact distal pulses.     Heart sounds: Murmur heard.   Early systolic murmur is present with a grade of 2/6 at the lower right sternal border.  No gallop.      Comments: No leg edema, no JVD. Pulmonary:     Effort: Pulmonary effort is normal.     Breath sounds: Normal breath sounds.  Abdominal:     General: Bowel sounds are normal.     Palpations: Abdomen is soft.    Laboratory examination:   Recent Labs    12/29/18 1848  NA 137  K 3.8  CL 105  CO2 23  GLUCOSE 104*  BUN 15  CREATININE 0.66  CALCIUM 9.1  GFRNONAA >60  GFRAA >60   CrCl cannot be calculated (Patient's most recent lab result is older than the maximum 21 days allowed.).  CMP Latest Ref Rng & Units 12/29/2018 02/23/2018 02/23/2017  Glucose 70 - 99 mg/dL 104(H) 93 102(H)  BUN 8 - 23 mg/dL 15 11 10   Creatinine 0.44 - 1.00 mg/dL 0.66 0.70 0.76  Sodium 135 - 145 mmol/L 137 138 139  Potassium 3.5 - 5.1 mmol/L 3.8 3.8 3.6  Chloride 98 - 111 mmol/L 105 102 104  CO2 22 - 32 mmol/L 23 27 26   Calcium 8.9 - 10.3 mg/dL 9.1 9.4 8.9  Total Protein 6.5 - 8.1 g/dL 6.7 6.4 6.6  Total Bilirubin 0.3 - 1.2 mg/dL 0.4 0.4 0.4  Alkaline Phos 38 - 126 U/L 62 60 82  AST 15 - 41 U/L 16 16 10   ALT 0 - 44 U/L 13 13 8    CBC Latest Ref Rng & Units 12/29/2018 02/23/2018 10/29/2016  WBC 4.0 - 10.5 K/uL 9.2 4.9 4.8  Hemoglobin 12.0 - 15.0 g/dL 15.5(H) 14.0 13.3  Hematocrit 36 - 46 % 47.7(H) 41.8 40.7  Platelets 150 - 400 K/uL 301 250.0 278.0   Lipid Panel     Component Value Date/Time   CHOL 178 02/28/2019 1045   TRIG 129.0 02/28/2019 1045   HDL 69.20 02/28/2019 1045   CHOLHDL 3 02/28/2019 1045   VLDL 25.8 02/28/2019 1045   LDLCALC 83 02/28/2019 1045    Ref Range & Units 1 yr ago  (02/23/18) 2 yr ago  (02/23/17) 2 yr ago  (07/14/16) 3 yr ago  (03/18/16) 4 yr ago  (01/28/15)  Cholesterol 0 - 200 mg/dL 197  169 CM  314High  CM  214High  CM  220High  R   Comment: ATP III Classification    Desirable: < 200 mg/dL        Borderline High: 200 - 239 mg/dL     High: > = 240 mg/dL   Triglycerides 0.0 - 149.0 mg/dL 118.0  113.0 CM  172.0High  CM  127.0 CM  259High  R   Comment: Normal: <150 mg/dLBorderline High: 150 - 199 mg/dL  HDL >10.27 mg/dL 25.36  64.40  34.74  25.95  75 R   VLDL 0.0 - 40.0 mg/dL 63.8  75.6  43.3  29.5  52High  R   LDL Cholesterol 0 - 99 mg/dL 188CZYS   75  063KZSW   126High   93 R, CM   Total CHOL/HDL Ratio  3         HEMOGLOBIN A1C Lab Results  Component Value Date   HGBA1C 5.3 02/28/2019   MPG 100 08/04/2016   TSH Recent Labs    02/28/19 1045  TSH 2.69    Medications and allergies  No Known Allergies   Current Outpatient Medications  Medication Instructions  . acetaminophen-codeine (TYLENOL #3) 300-30 MG tablet Oral, Every 4 hours PRN  . ALPRAZolam (XANAX) 0.5 mg, Oral, At bedtime PRN  . amLODipine (NORVASC) 10 mg, Oral, Daily  . butalbital-acetaminophen-caffeine (FIORICET) 50-325-40 MG tablet 1 tablet, Oral, 2 times daily PRN  . calcium carbonate (TUMS - DOSED IN MG ELEMENTAL CALCIUM) 500 MG chewable tablet 1 tablet, Oral, 2 times daily  . cetirizine (ZYRTEC) 10 mg, Oral, Daily at bedtime  . diclofenac (VOLTAREN) 75 MG EC tablet TAKE 1 TABLET BY MOUTH 2 TIMES DAILY AS NEEDED FOR knee PAIN  . DULoxetine (CYMBALTA) 90 mg, Oral, Daily  . estradiol (VIVELLE-DOT) 0.1 MG/24HR patch 1 patch, Transdermal, 2 times weekly, Sunday & Thursday  . ezetimibe (ZETIA) 10 MG tablet TAKE 1 TABLET BY MOUTH EVERY DAY  . levothyroxine (SYNTHROID) 50 mcg, Oral, Daily  . losartan (COZAAR) 50 mg, Oral, Daily  . Melatonin 10 MG TABS Oral, Daily at bedtime  . rosuvastatin (CRESTOR) 5 mg, Oral, Daily at bedtime  . temazepam (RESTORIL) 15 mg, Oral, At bedtime PRN  . triazolam (HALCION) 0.25 mg, Oral, As needed   Radiology:   No results found.  Cardiac Studies:   Echocardiogram 01/11/2019: Left ventricle cavity is normal in size. Mild concentric hypertrophy of the left ventricle. Normal LV systolic function with visual EF 50-55%. Normal global  wall motion. Normal diastolic filling pattern.  Trileaflet aortic valve.  Trace aortic regurgitation. Mild prolapse of the mitral valve leaflets with mild (Grade I) eccentric mitral regurgitation. Mild tricuspid regurgitation.  Mild pulmonic regurgitation. No evidence of pulmonary hypertension. Compared to previous study in 2014, valvular regurgitation findings are new.  Lexiscan Tetrofosmin stress test 01/24/2019: No previous exam available for comparison. Lexiscan/walking nuclear stress test performed using 1-day protocol. Stress EKG is non-diagnostic, as this is pharmacological stress test. Stress EKG showed sinus tachycardia at 99% MPHR, no ischemic changes.  Myocardial perfusion imaging is normal. Stress LVEF 20%.  High risk study due to reduced LVEF. Recommend clinical correlation.  Exercise testing 07/12/2019: with gas exchange demonstrates normal functional capacity when compared to matched sedentary norms. There is no cardiopulmonary abnormality. Patient has an excessive HR response to exercise and given all other normal parameters, this could be improved with cardiovascular exercise training, however should be clinically followed.    Coronary Calcium Score 06/14/2019: Total Agatston Score: 0  MESA database percentile: 0  AORTA MEASUREMENTS:  Ascending Aorta: 32 mm  Descending Aorta: 22 mm  OTHER FINDINGS:  Heart is normal size. No adenopathy. Scarring in the right middle lobe. No confluent opacities or effusions. Imaging into the upper abdomen shows no acute findings. Bilateral breast implants noted. Heart and mediastinal contours are within normal limits. No focal opacities or effusions. No  acute bony abnormality.  IMPRESSION: No visible coronary artery calcifications. Total coronary calcium score of 0.  No acute or significant extracardiac abnormality.  EKG    EKG 05/23/2019: Sinus bradycardia at rate of 58 bpm with borderline first-degree AV block, left  atrial abnormality, poor R wave progression, cannot exclude anteroseptal infarct old. T wave inversion in V1 to V3, cannot exclude anteroseptal ischemia.  No significant change from 03/06/2018.  Assessment     ICD-10-CM   1. Dyspnea on exertion  R06.00   2. Familial hypercholesteremia  E78.01   3. Essential hypertension  I10 losartan (COZAAR) 50 MG tablet     Meds ordered this encounter  Medications  . losartan (COZAAR) 50 MG tablet    Sig: Take 1 tablet (50 mg total) by mouth daily.    Dispense:  90 tablet    Refill:  3    Medications Discontinued During This Encounter  Medication Reason  . losartan (COZAAR) 25 MG tablet Reorder    Recommendations:   Kristy Arias  is a 69 y.o. Caucasian female  with hypertension, hypothyroidism, and probably familial hyperlipidemia (07/14/2016: Total cholesterol 04/02/2012 and LDL 222), abnormal treadmill stress test in 2014 and coronary angiography normal at that time, due to recurrent chest pain she had undergone nuclear test test in December 2020 which was normal. Felt to likely have endothelial dysfunction.  On her last office visit 6 weeks ago, due to continued chest discomfort and marked dyspnea on exertion, I will obtain cardiopulmonary stress test and coronary calcium score and she now presents for follow-up.  I reviewed the results of the test, I reassured her.  Advised her to increase her physical activity as tolerated.  She has now again reduced taking Crestor to once a week, advised her to go back to taking it at least twice weekly as she was taking previously.  As her tests are low risk, I will see her back in a year.  Her blood pressure was elevated today and also previous office visit, patient reluctant to making medication changes, she agrees to increase losartan to 50 mg daily.  Yates Decamp, MD, [facility] 07/19/2019, 9:56 AM [facility] Office: [phone]

## 2019-08-31 ENCOUNTER — Other Ambulatory Visit: Payer: Self-pay | Admitting: Family Medicine

## 2019-12-17 ENCOUNTER — Other Ambulatory Visit: Payer: Self-pay | Admitting: Family Medicine

## 2019-12-17 DIAGNOSIS — M8949 Other hypertrophic osteoarthropathy, multiple sites: Secondary | ICD-10-CM

## 2019-12-17 DIAGNOSIS — M159 Polyosteoarthritis, unspecified: Secondary | ICD-10-CM

## 2020-01-17 ENCOUNTER — Other Ambulatory Visit: Payer: Self-pay | Admitting: Obstetrics & Gynecology

## 2020-01-17 DIAGNOSIS — R928 Other abnormal and inconclusive findings on diagnostic imaging of breast: Secondary | ICD-10-CM

## 2020-02-06 ENCOUNTER — Ambulatory Visit
Admission: RE | Admit: 2020-02-06 | Discharge: 2020-02-06 | Disposition: A | Discharge status: Home / Self Care | Payer: Medicare Other | Source: Ambulatory Visit | Attending: Obstetrics & Gynecology | Admitting: Obstetrics & Gynecology

## 2020-02-06 ENCOUNTER — Other Ambulatory Visit: Payer: Self-pay

## 2020-02-06 ENCOUNTER — Other Ambulatory Visit: Payer: Self-pay | Admitting: Obstetrics & Gynecology

## 2020-02-06 DIAGNOSIS — R928 Other abnormal and inconclusive findings on diagnostic imaging of breast: Secondary | ICD-10-CM

## 2020-02-13 ENCOUNTER — Other Ambulatory Visit: Payer: Self-pay | Admitting: Family Medicine

## 2020-02-13 DIAGNOSIS — I10 Essential (primary) hypertension: Secondary | ICD-10-CM

## 2020-02-14 ENCOUNTER — Other Ambulatory Visit: Payer: Self-pay

## 2020-02-14 ENCOUNTER — Ambulatory Visit
Admission: RE | Admit: 2020-02-14 | Discharge: 2020-02-14 | Disposition: A | Discharge status: Home / Self Care | Payer: Medicare Other | Source: Ambulatory Visit | Attending: Obstetrics & Gynecology | Admitting: Obstetrics & Gynecology

## 2020-02-14 DIAGNOSIS — R928 Other abnormal and inconclusive findings on diagnostic imaging of breast: Secondary | ICD-10-CM

## 2020-02-29 ENCOUNTER — Other Ambulatory Visit: Payer: Self-pay | Admitting: Family Medicine

## 2020-03-17 NOTE — Progress Notes (Addendum)
[facility] [address], [address] [place], [place] [address] [phone] [phone]  Date:  03/20/2020   Name:  Kristy CivatteDiane C Higby   DOB:  11/24/1950   MRN:  [id number]  PCP:  Pearline Cablesopland, Lyrick Worland C, MD    Chief Complaint: Annual Exam   History of Present Illness:  Kristy Arias is a 70 y.o. very pleasant female patient who presents with the following:  Here today for a CPE Last seen by myself about one year ago  History of hypertension, migraine, hyperlipidemia, hypothyroidism  Married to [place], one daughter and 2 grandkids ages 710 and 578  The grands are doing well and thriving   [facility] last seen in June with chest pain She notes that she continues to have occasional SOB- more so at rest This last occurred in January- occurs most often when she rises in the am and will go away by mid- day.   Never a smoker  She does walk for exercise- does about a mile a day  She had a stress test in June and did fine Conclusion: Exercise testing with gas exchange demonstrates normal functional capacity when compared to matched sedentary norms. There is no cardiopulmonary abnormality. Patient has an excessive HR response to exercise and given all other normal parameters, this could be improved with cardiovascular exercise training, however should be clinically followed.   mammo- UTD dexa- done in December 2021 Flu- done  covid booster Pneumovax 23- will give today  Labs one year ago  shingrix- is needed  Her GYN is retiring   Patient Active Problem List   Diagnosis Date Noted  . Hypothyroidism (acquired) 02/26/2019  . OA (osteoarthritis) of knee 08/25/2015  . Memory loss 06/05/2014  . Chronic migraine without aura without status migrainosus, not intractable 06/05/2014  . Chronic cholecystitis with calculus s/p lap chole 06/29/2012 04/13/2012  . Heartburn 04/13/2012  . Hypertension 11/02/2011  . Fibromuscular dysplasia (HCC)  11/02/2011  . Hyperlipidemia 11/02/2011  . Allergic rhinitis 11/02/2011  . Arthritis 11/02/2011  . Depression 11/02/2011    Past Medical History:  Diagnosis Date  . Anemia   . Anxiety   . Arthritis   . Depression   . GERD (gastroesophageal reflux disease)   . Glaucoma   . Headache   . Heart murmur   . Hyperlipidemia   . Hypertension   . Hypothyroidism   . Shortness of breath    syndrome x  . Syndrome X, cardiac [facility](HCC)     Past Surgical History:  Procedure Laterality Date  . ABDOMINAL HYSTERECTOMY    . BREAST ENHANCEMENT SURGERY    . BREAST SURGERY    . CARDIAC CATHETERIZATION  4/14  . CHOLECYSTECTOMY    . COSMETIC SURGERY    . DILATION AND CURETTAGE OF UTERUS  2013  . EYE SURGERY    . LAPAROSCOPIC CHOLECYSTECTOMY SINGLE PORT N/A 06/29/2012   Procedure: LAPAROSCOPIC CHOLECYSTECTOMY SINGLE PORT IOC ;  Surgeon: Ardeth SportsmanSteven C. Gross, MD;  Location: [facility];  Service: General;  Laterality: N/A;  . LEFT HEART CATHETERIZATION WITH CORONARY ANGIOGRAM N/A 05/23/2012   Procedure: LEFT HEART CATHETERIZATION WITH CORONARY ANGIOGRAM;  Surgeon: Pamella PertJagadeesh R Ganji, MD;  [facility];  Service: Cardiovascular;  Laterality: N/A;  . TOTAL KNEE ARTHROPLASTY Right 08/25/2015   Procedure: RIGHT TOTAL KNEE ARTHROPLASTY;  Surgeon: Ollen GrossFrank Aluisio, MD;  Location: [facility];  Service: Orthopedics;  Laterality: Right;  . TOTAL KNEE ARTHROPLASTY  Left 08/09/2016   Procedure: LEFT TOTAL KNEE ARTHROPLASTY;  Surgeon: Ollen Gross, MD;  Location: [facility];  Service: Orthopedics;  Laterality: Left;    Social History   Tobacco Use  . Smoking status: Never Smoker  . Smokeless tobacco: Never Used  Vaping Use  . Vaping Use: Never used  Substance Use Topics  . Alcohol use: No  . Drug use: No    Family History  Problem Relation Age of Onset  . Hypertension Father   . Diabetes Father   . Heart disease Father   . Hyperlipidemia Father   . Hypertension Maternal Grandmother   . Heart disease Maternal  Grandmother   . Diabetes Maternal Grandfather   . Hyperlipidemia Maternal Grandfather   . Heart disease Maternal Grandfather   . Osteoporosis Paternal Grandmother   . Heart disease Paternal Grandmother   . Heart disease Paternal Grandfather   . Cancer Mother   . Hyperlipidemia Mother   . Hypertension Mother   . Cancer Sister     No Known Allergies  Medication list has been reviewed and updated.  Current Outpatient Medications on File Prior to Visit  Medication Sig Dispense Refill  . ALPRAZolam (XANAX) 0.5 MG tablet Take 1 tablet (0.5 mg total) by mouth at bedtime as needed for anxiety. (Patient taking differently: Take 0.5 mg by mouth 3 (three) times daily as needed for anxiety.) 30 tablet 2  . amLODipine (NORVASC) 5 MG tablet TAKE 2 TABLETS BY MOUTH EVERY DAY 180 tablet 3  . butalbital-acetaminophen-caffeine (FIORICET) 50-325-40 MG tablet Take 1 tablet by mouth 2 (two) times daily as needed.    . calcium carbonate (TUMS - DOSED IN MG ELEMENTAL CALCIUM) 500 MG chewable tablet Chew 1 tablet by mouth 2 (two) times daily.    . cetirizine (ZYRTEC) 10 MG tablet Take 10 mg by mouth at bedtime.    . diclofenac (VOLTAREN) 75 MG EC tablet Take 1 tablet (75 mg total) by mouth 2 (two) times daily as needed for moderate pain. 180 tablet 0  . DULoxetine (CYMBALTA) 60 MG capsule Take 60 mg by mouth 2 (two) times daily.    Marland Kitchen estradiol (VIVELLE-DOT) 0.1 MG/24HR patch Place 1 patch onto the skin 2 (two) times a week. Sunday & Thursday    . ezetimibe (ZETIA) 10 MG tablet Take 1 tablet (10 mg total) by mouth daily. 90 tablet 0  . levothyroxine (SYNTHROID) 50 MCG tablet Take 1 tablet (50 mcg total) by mouth daily. 90 tablet 3  . losartan (COZAAR) 50 MG tablet Take 1 tablet (50 mg total) by mouth daily. 90 tablet 3  . Melatonin 10 MG TABS Take by mouth at bedtime.    . rosuvastatin (CRESTOR) 5 MG tablet Take 5 mg by mouth 3 (three) times a week.    . temazepam (RESTORIL) 15 MG capsule Take 15 mg by mouth  at bedtime as needed for sleep.     No current facility-administered medications on file prior to visit.    Review of Systems:  As per HPI- otherwise negative.   Physical Examination: Vitals:   03/20/20 1005  BP: 118/72  Pulse: (!) 55  Resp: 16  Temp: 98.2 F (36.8 C)  SpO2: 98%   Vitals:   03/20/20 1005  Weight: 127 lb (57.6 kg)  Height: 5\' 2"  (1.575 m)   Body mass index is 23.23 kg/m. Ideal Body Weight: Weight in (lb) to have BMI = 25: 136.4  GEN: no acute distress.  Normal weight, looks well  HEENT: Atraumatic, Normocephalic.    Ears and Nose: No external deformity. CV: RRR, No M/G/R. No JVD. No thrill. No extra heart sounds. PULM: CTA B, no wheezes, crackles, rhonchi. No retractions. No resp. distress. No accessory muscle use. ABD: S, NT, ND, +BS. No rebound. No HSM. EXTR: No c/c/e PSYCH: Normally interactive. Conversant.    Assessment and Plan: Physical exam  Hypothyroidism (acquired) - Plan: TSH  Screening for diabetes mellitus - Plan: Comprehensive metabolic panel, Hemoglobin A1c  Essential hypertension - Plan: CBC, Comprehensive metabolic panel, losartan (COZAAR) 50 MG tablet  Mixed hyperlipidemia - Plan: Lipid panel, rosuvastatin (CRESTOR) 5 MG tablet, ezetimibe (ZETIA) 10 MG tablet  Fatigue, unspecified type - Plan: TSH, VITAMIN D 25 Hydroxy (Vit-D Deficiency, Fractures)  Acquired hypothyroidism - Plan: levothyroxine (SYNTHROID) 50 MCG tablet  Primary osteoarthritis involving multiple joints - Plan: diclofenac (VOLTAREN) 75 MG EC tablet  Immunization due - Plan: Pneumococcal polysaccharide vaccine 23-valent greater than or equal to 2yo subcutaneous/IM  Caregiver stress - Plan: DULoxetine (CYMBALTA) 60 MG capsule  CPE today Labs pending as above Did refills Pneumovax shingrix at pharmacy Discussed her occasional SOB- offered pulmonary consultation She declines for now- will let me know if this continues to be an issue Encouraged healthy diet  and exercise routine  This visit occurred during the SARS-CoV-2 public health emergency.  Safety protocols were in place, including screening questions prior to the visit, additional usage of staff PPE, and extensive cleaning of exam room while observing appropriate contact time as indicated for disinfecting solutions.    Signed Abbe Amsterdam, MD  Received her labs - message to pt  Results for orders placed or performed in visit on 03/20/20  CBC  Result Value Ref Range   WBC 6.0 4.0 - 10.5 K/uL   RBC 4.28 3.87 - 5.11 Mil/uL   Platelets 304.0 150.0 - 400.0 K/uL   Hemoglobin 13.8 12.0 - 15.0 g/dL   HCT 97.9 89.2 - 11.9 %   MCV 96.5 78.0 - 100.0 fl   MCHC 33.5 30.0 - 36.0 g/dL   RDW 41.7 40.8 - 14.4 %  Comprehensive metabolic panel  Result Value Ref Range   Sodium 138 135 - 145 mEq/L   Potassium 4.3 3.5 - 5.1 mEq/L   Chloride 105 96 - 112 mEq/L   CO2 27 19 - 32 mEq/L   Glucose, Bld 88 70 - 99 mg/dL   BUN 12 6 - 23 mg/dL   Creatinine, Ser 8.18 0.40 - 1.20 mg/dL   Total Bilirubin 0.6 0.2 - 1.2 mg/dL   Alkaline Phosphatase 57 39 - 117 U/L   AST 17 0 - 37 U/L   ALT 15 0 - 35 U/L   Total Protein 6.5 6.0 - 8.3 g/dL   Albumin 4.0 3.5 - 5.2 g/dL   GFR 56.31 >49.70 mL/min   Calcium 9.1 8.4 - 10.5 mg/dL  Hemoglobin Y6V  Result Value Ref Range   Hgb A1c MFr Bld 5.2 4.6 - 6.5 %  Lipid panel  Result Value Ref Range   Cholesterol 151 0 - 200 mg/dL   Triglycerides 785.8 0.0 - 149.0 mg/dL   HDL 85.02 >77.41 mg/dL   VLDL 28.7 0.0 - 86.7 mg/dL   LDL Cholesterol 60 0 - 99 mg/dL   Total CHOL/HDL Ratio 2    NonHDL 80.61   TSH  Result Value Ref Range   TSH 2.76 0.35 - 4.50 uIU/mL  VITAMIN D 25 Hydroxy (Vit-D Deficiency, Fractures)  Result Value Ref Range  VITD 54.34 30.00 - 100.00 ng/mL

## 2020-03-17 NOTE — Patient Instructions (Addendum)
Good to see you again today- I will be in touch with your labs asap Pneumonia booster given today Shingrix at pharmacy at your convenience Let me know if you need any further refills  Take care!    Health Maintenance After Age 68 After age 70, you are at a higher risk for certain long-term diseases and infections as well as injuries from falls. Falls are a major cause of broken bones and head injuries in people who are older than age 43. Getting regular preventive care can help to keep you healthy and well. Preventive care includes getting regular testing and making lifestyle changes as recommended by your health care provider. Talk with your health care provider about:  Which screenings and tests you should have. A screening is a test that checks for a disease when you have no symptoms.  A diet and exercise plan that is right for you. What should I know about screenings and tests to prevent falls? Screening and testing are the best ways to find a health problem early. Early diagnosis and treatment give you the best chance of managing medical conditions that are common after age 49. Certain conditions and lifestyle choices may make you more likely to have a fall. Your health care provider may recommend:  Regular vision checks. Poor vision and conditions such as cataracts can make you more likely to have a fall. If you wear glasses, make sure to get your prescription updated if your vision changes.  Medicine review. Work with your health care provider to regularly review all of the medicines you are taking, including over-the-counter medicines. Ask your health care provider about any side effects that may make you more likely to have a fall. Tell your health care provider if any medicines that you take make you feel dizzy or sleepy.  Osteoporosis screening. Osteoporosis is a condition that causes the bones to get weaker. This can make the bones weak and cause them to break more easily.  Blood  pressure screening. Blood pressure changes and medicines to control blood pressure can make you feel dizzy.  Strength and balance checks. Your health care provider may recommend certain tests to check your strength and balance while standing, walking, or changing positions.  Foot health exam. Foot pain and numbness, as well as not wearing proper footwear, can make you more likely to have a fall.  Depression screening. You may be more likely to have a fall if you have a fear of falling, feel emotionally low, or feel unable to do activities that you used to do.  Alcohol use screening. Using too much alcohol can affect your balance and may make you more likely to have a fall. What actions can I take to lower my risk of falls? General instructions  Talk with your health care provider about your risks for falling. Tell your health care provider if: ? You fall. Be sure to tell your health care provider about all falls, even ones that seem minor. ? You feel dizzy, sleepy, or off-balance.  Take over-the-counter and prescription medicines only as told by your health care provider. These include any supplements.  Eat a healthy diet and maintain a healthy weight. A healthy diet includes low-fat dairy products, low-fat (lean) meats, and fiber from whole grains, beans, and lots of fruits and vegetables. Home safety  Remove any tripping hazards, such as rugs, cords, and clutter.  Install safety equipment such as grab bars in bathrooms and safety rails on stairs.  Keep rooms and  walkways well-lit. Activity  Follow a regular exercise program to stay fit. This will help you maintain your balance. Ask your health care provider what types of exercise are appropriate for you.  If you need a cane or walker, use it as recommended by your health care provider.  Wear supportive shoes that have nonskid soles.   Lifestyle  Do not drink alcohol if your health care provider tells you not to drink.  If you  drink alcohol, limit how much you have: ? 0-1 drink a day for women. ? 0-2 drinks a day for men.  Be aware of how much alcohol is in your drink. In the [place] one drink equals one typical bottle of beer (12 oz), one-half glass of wine (5 oz), or one shot of hard liquor (1 oz).  Do not use any products that contain nicotine or tobacco, such as cigarettes and e-cigarettes. If you need help quitting, ask your health care provider. Summary  Having a healthy lifestyle and getting preventive care can help to protect your health and wellness after age 38.  Screening and testing are the best way to find a health problem early and help you avoid having a fall. Early diagnosis and treatment give you the best chance for managing medical conditions that are more common for people who are older than age 72.  Falls are a major cause of broken bones and head injuries in people who are older than age 41. Take precautions to prevent a fall at home.  Work with your health care provider to learn what changes you can make to improve your health and wellness and to prevent falls. This information is not intended to replace advice given to you by your health care provider. Make sure you discuss any questions you have with your health care provider. Document Revised: 05/04/2018 Document Reviewed: 11/24/2016 Elsevier Patient Education  2021 [employer].

## 2020-03-19 ENCOUNTER — Other Ambulatory Visit: Payer: Self-pay

## 2020-03-20 ENCOUNTER — Ambulatory Visit (INDEPENDENT_AMBULATORY_CARE_PROVIDER_SITE_OTHER): Payer: Medicare Other | Admitting: Family Medicine

## 2020-03-20 ENCOUNTER — Encounter: Payer: Self-pay | Admitting: Family Medicine

## 2020-03-20 ENCOUNTER — Other Ambulatory Visit: Payer: Self-pay

## 2020-03-20 VITALS — BP 118/72 | HR 55 | Temp 98.2°F | Resp 16 | Ht 62.0 in | Wt 127.0 lb

## 2020-03-20 DIAGNOSIS — Z131 Encounter for screening for diabetes mellitus: Secondary | ICD-10-CM | POA: Diagnosis not present

## 2020-03-20 DIAGNOSIS — Z0000 Encounter for general adult medical examination without abnormal findings: Secondary | ICD-10-CM | POA: Diagnosis not present

## 2020-03-20 DIAGNOSIS — I10 Essential (primary) hypertension: Secondary | ICD-10-CM

## 2020-03-20 DIAGNOSIS — R5383 Other fatigue: Secondary | ICD-10-CM

## 2020-03-20 DIAGNOSIS — Z23 Encounter for immunization: Secondary | ICD-10-CM | POA: Diagnosis not present

## 2020-03-20 DIAGNOSIS — E782 Mixed hyperlipidemia: Secondary | ICD-10-CM | POA: Diagnosis not present

## 2020-03-20 DIAGNOSIS — M8949 Other hypertrophic osteoarthropathy, multiple sites: Secondary | ICD-10-CM

## 2020-03-20 DIAGNOSIS — Z636 Dependent relative needing care at home: Secondary | ICD-10-CM

## 2020-03-20 DIAGNOSIS — M159 Polyosteoarthritis, unspecified: Secondary | ICD-10-CM

## 2020-03-20 DIAGNOSIS — E039 Hypothyroidism, unspecified: Secondary | ICD-10-CM | POA: Diagnosis not present

## 2020-03-20 LAB — COMPREHENSIVE METABOLIC PANEL
ALT: 15 U/L (ref 0–35)
AST: 17 U/L (ref 0–37)
Albumin: 4 g/dL (ref 3.5–5.2)
Alkaline Phosphatase: 57 U/L (ref 39–117)
BUN: 12 mg/dL (ref 6–23)
CO2: 27 mEq/L (ref 19–32)
Calcium: 9.1 mg/dL (ref 8.4–10.5)
Chloride: 105 mEq/L (ref 96–112)
Creatinine, Ser: 0.87 mg/dL (ref 0.40–1.20)
GFR: 68.87 mL/min (ref >60.00)
Glucose, Bld: 88 mg/dL (ref 70–99)
Potassium: 4.3 mEq/L (ref 3.5–5.1)
Sodium: 138 mEq/L (ref 135–145)
Total Bilirubin: 0.6 mg/dL (ref 0.2–1.2)
Total Protein: 6.5 g/dL (ref 6.0–8.3)

## 2020-03-20 LAB — LIPID PANEL
Cholesterol: 151 mg/dL (ref 0–200)
HDL: 70.6 mg/dL (ref >39.00)
LDL Cholesterol: 60 mg/dL (ref 0–99)
NonHDL: 80.61
Total CHOL/HDL Ratio: 2
Triglycerides: 104 mg/dL (ref 0.0–149.0)
VLDL: 20.8 mg/dL (ref 0.0–40.0)

## 2020-03-20 LAB — CBC
HCT: 41.3 % (ref 36.0–46.0)
Hemoglobin: 13.8 g/dL (ref 12.0–15.0)
MCHC: 33.5 g/dL (ref 30.0–36.0)
MCV: 96.5 fl (ref 78.0–100.0)
Platelets: 304 10*3/uL (ref 150.0–400.0)
RBC: 4.28 Mil/uL (ref 3.87–5.11)
RDW: 12.6 % (ref 11.5–15.5)
WBC: 6 10*3/uL (ref 4.0–10.5)

## 2020-03-20 LAB — TSH: TSH: 2.76 u[IU]/mL (ref 0.35–4.50)

## 2020-03-20 LAB — HEMOGLOBIN A1C: Hgb A1c MFr Bld: 5.2 % (ref 4.6–6.5)

## 2020-03-20 LAB — VITAMIN D 25 HYDROXY (VIT D DEFICIENCY, FRACTURES): VITD: 54.34 ng/mL (ref 30.00–100.00)

## 2020-03-20 MED ORDER — DULOXETINE HCL 60 MG PO CPEP: 60.0000 mg | ORAL_CAPSULE | Freq: Every day | ORAL | 3 refills | Status: DC

## 2020-03-20 MED ORDER — EZETIMIBE 10 MG PO TABS: 10.0000 mg | ORAL_TABLET | Freq: Every day | ORAL | 3 refills | Status: DC

## 2020-03-20 MED ORDER — LEVOTHYROXINE SODIUM 50 MCG PO TABS: 50.0000 ug | ORAL_TABLET | Freq: Every day | ORAL | 3 refills | Status: DC

## 2020-03-20 MED ORDER — ROSUVASTATIN CALCIUM 5 MG PO TABS: 5.0000 mg | ORAL_TABLET | ORAL | 3 refills | Status: DC

## 2020-03-20 MED ORDER — DICLOFENAC SODIUM 75 MG PO TBEC
75.0000 mg | DELAYED_RELEASE_TABLET | Freq: Two times a day (BID) | ORAL | 2 refills | Status: DC | PRN
Start: 2020-03-20 — End: 2020-10-03

## 2020-03-20 MED ORDER — LOSARTAN POTASSIUM 50 MG PO TABS
50.0000 mg | ORAL_TABLET | Freq: Every day | ORAL | 3 refills | Status: DC
Start: 2020-03-20 — End: 2020-05-05

## 2020-04-01 ENCOUNTER — Other Ambulatory Visit: Payer: Self-pay | Admitting: Family Medicine

## 2020-04-01 DIAGNOSIS — E039 Hypothyroidism, unspecified: Secondary | ICD-10-CM

## 2020-05-05 ENCOUNTER — Other Ambulatory Visit: Payer: Self-pay

## 2020-05-05 DIAGNOSIS — I10 Essential (primary) hypertension: Secondary | ICD-10-CM

## 2020-05-05 MED ORDER — LOSARTAN POTASSIUM 50 MG PO TABS: 50.0000 mg | ORAL_TABLET | Freq: Every day | ORAL | 3 refills | Status: DC

## 2020-05-06 ENCOUNTER — Telehealth: Payer: Self-pay | Admitting: Family Medicine

## 2020-05-06 MED ORDER — DULOXETINE HCL 30 MG PO CPEP: 30.0000 mg | ORAL_CAPSULE | Freq: Two times a day (BID) | ORAL | 5 refills | Status: DC

## 2020-05-06 NOTE — Telephone Encounter (Signed)
Patient is requesting a modification on her prescription of DULoxetine (CYMBALTA) 60 MG capsule, Patient would like to take 30MG  twice daily , instead of taking medication once daily.  Please advise

## 2020-05-06 NOTE — Telephone Encounter (Signed)
Updated medication-sent new rx to pharmacy.

## 2020-05-06 NOTE — Telephone Encounter (Signed)
Please advise if prescription can be changed, if so , patient would like a new script sent to [facility]

## 2020-05-06 NOTE — Addendum Note (Signed)
Addended by: Steve Rattler A on: 05/06/2020 01:42 PM   Modules accepted: Orders

## 2020-06-08 ENCOUNTER — Encounter: Payer: Self-pay | Admitting: Family Medicine

## 2020-07-04 ENCOUNTER — Encounter: Payer: Self-pay | Admitting: Family Medicine

## 2020-07-18 ENCOUNTER — Ambulatory Visit: Payer: Medicare Other | Admitting: Cardiology

## 2020-07-23 ENCOUNTER — Encounter: Payer: Self-pay | Admitting: Cardiology

## 2020-07-23 ENCOUNTER — Ambulatory Visit: Payer: Medicare Other | Admitting: Cardiology

## 2020-07-23 ENCOUNTER — Other Ambulatory Visit: Payer: Self-pay

## 2020-07-23 VITALS — BP 114/69 | HR 70 | Temp 98.3°F | Resp 17 | Ht 62.0 in | Wt 125.2 lb

## 2020-07-23 DIAGNOSIS — R0609 Other forms of dyspnea: Secondary | ICD-10-CM

## 2020-07-23 DIAGNOSIS — R0989 Other specified symptoms and signs involving the circulatory and respiratory systems: Secondary | ICD-10-CM

## 2020-07-23 DIAGNOSIS — E782 Mixed hyperlipidemia: Secondary | ICD-10-CM

## 2020-07-23 DIAGNOSIS — I10 Essential (primary) hypertension: Secondary | ICD-10-CM

## 2020-07-23 DIAGNOSIS — R42 Dizziness and giddiness: Secondary | ICD-10-CM

## 2020-07-23 DIAGNOSIS — R0600 Dyspnea, unspecified: Secondary | ICD-10-CM

## 2020-07-23 NOTE — Progress Notes (Signed)
Primary Physician/Referring:  Pearline Cables, MD  Patient ID: Kristy Arias, female    DOB: 1950/03/23, 70 y.o.   MRN: [id number]  Chief Complaint  Patient presents with   Hypertension   Hyperlipidemia    1 YEAR   HPI:    Kristy Arias  is a 70 y.o. Caucasian female  with hypertension, hypothyroidism, and hyperlipidemia, abnormal treadmill stress test in 2014 and coronary angiography normal at that time, due to recurrent chest pain she had undergone nuclear test test in December 2020 which was normal. Felt to likely have endothelial dysfunction.   This is her annual visit, she is presently doing well and has not had any further episodes of chest pain.  She has noticed occasional episodes of dizziness and at that time she has recorded blood pressure to be low and associated dyspnea only during these episodes.  Otherwise she has remained fairly active.    Past Medical History:  Diagnosis Date   Anemia    Anxiety    Arthritis    Depression    GERD (gastroesophageal reflux disease)    Glaucoma    Headache    Heart murmur    Hyperlipidemia    Hypertension    Hypothyroidism    Shortness of breath    syndrome x   Syndrome X, cardiac [facility])    Past Surgical History:  Procedure Laterality Date   ABDOMINAL HYSTERECTOMY     BREAST ENHANCEMENT SURGERY     BREAST SURGERY     CARDIAC CATHETERIZATION  4/14   CHOLECYSTECTOMY     COSMETIC SURGERY     DILATION AND CURETTAGE OF UTERUS  2013   EYE SURGERY     LAPAROSCOPIC CHOLECYSTECTOMY SINGLE PORT N/A 06/29/2012   Procedure: LAPAROSCOPIC CHOLECYSTECTOMY SINGLE PORT IOC ;  Surgeon: Ardeth Sportsman, MD;  Location: [facility];  Service: General;  Laterality: N/A;   LEFT HEART CATHETERIZATION WITH CORONARY ANGIOGRAM N/A 05/23/2012   Procedure: LEFT HEART CATHETERIZATION WITH CORONARY ANGIOGRAM;  Surgeon: [name], MD;  Location[facility] C[facility] LAB;  Service: Cardiovascular;  Laterality: N/A;   TOTAL KNEE ARTHROPLASTY Right 08/25/2015    Procedure: RIGHT TOTAL KNEE ARTHROPLASTY;  Surgeon: Ollen Gross, MD;  Location: [facility];  Service: Orthopedics;  Laterality: Right;   TOTAL KNEE ARTHROPLASTY Left 08/09/2016   Procedure: LEFT TOTAL KNEE ARTHROPLASTY;  Surgeon: Ollen Gross, MD;  Location: [facility];  Service: Orthopedics;  Laterality: Left;   Family History  Problem Relation Age of Onset   Hypertension Father    Diabetes Father    Heart disease Father    Hyperlipidemia Father    Hypertension Maternal Grandmother    Heart disease Maternal Grandmother    Diabetes Maternal Grandfather    Hyperlipidemia Maternal Grandfather    Heart disease Maternal Grandfather    Osteoporosis Paternal Grandmother    Heart disease Paternal Grandmother    Heart disease Paternal Grandfather    Cancer Mother    Hyperlipidemia Mother    Hypertension Mother    Cancer Sister     Social History   Tobacco Use   Smoking status: Never   Smokeless tobacco: Never  Substance Use Topics   Alcohol use: No   Marital Status: Married  ROS  Review of Systems  Cardiovascular:  Positive for dyspnea on exertion. Negative for chest pain and leg swelling.  Gastrointestinal:  Negative for melena.  Neurological:  Positive for dizziness.  Objective  Blood pressure 114/69, pulse 70, temperature 98.3 F (  36.8 C), temperature source Temporal, resp. rate 17, height 5\' 2"  (1.575 m), weight 125 lb 3.2 oz (56.8 kg), SpO2 97 %.  Vitals with BMI 07/23/2020 03/20/2020 07/19/2019  Height 5\' 2"  5\' 2"  5\' 2"   Weight 125 lbs 3 oz 127 lbs 120 lbs  BMI 22.89 23.22 21.94  Systolic 114 118 409138  Diastolic 69 72 85  Pulse 70 55 74     Physical Exam Neck:     Vascular: Carotid bruit (Bilateral) present. No JVD.  Cardiovascular:     Rate and Rhythm: Normal rate and regular rhythm.     Pulses: Normal pulses and intact distal pulses.     Heart sounds: Murmur heard.  Early systolic murmur is present with a grade of 2/6 at the lower right sternal border.    No gallop.      Comments: No leg edema, no JVD. Pulmonary:     Effort: Pulmonary effort is normal.     Breath sounds: Normal breath sounds.  Abdominal:     General: Bowel sounds are normal.     Palpations: Abdomen is soft.   Laboratory examination:   Recent Labs    03/20/20 1032  NA 138  K 4.3  CL 105  CO2 27  GLUCOSE 88  BUN 12  CREATININE 0.87  CALCIUM 9.1   CrCl cannot be calculated (Patient's most recent lab result is older than the maximum 21 days allowed.).  CMP Latest Ref Rng & Units 03/20/2020 12/29/2018 02/23/2018  Glucose 70 - 99 mg/dL 88 811(B104(H) 93  BUN 6 - 23 mg/dL 12 15 11   Creatinine 0.40 - 1.20 mg/dL 1.470.87 8.290.66 5.620.70  Sodium 135 - 145 mEq/L 138 137 138  Potassium 3.5 - 5.1 mEq/L 4.3 3.8 3.8  Chloride 96 - 112 mEq/L 105 105 102  CO2 19 - 32 mEq/L 27 23 27   Calcium 8.4 - 10.5 mg/dL 9.1 9.1 9.4  Total Protein 6.0 - 8.3 g/dL 6.5 6.7 6.4  Total Bilirubin 0.2 - 1.2 mg/dL 0.6 0.4 0.4  Alkaline Phos 39 - 117 U/L 57 62 60  AST 0 - 37 U/L 17 16 16   ALT 0 - 35 U/L 15 13 13    CBC Latest Ref Rng & Units 03/20/2020 12/29/2018 02/23/2018  WBC 4.0 - 10.5 K/uL 6.0 9.2 4.9  Hemoglobin 12.0 - 15.0 g/dL 13.013.8 15.5(H) 14.0  Hematocrit 36.0 - 46.0 % 41.3 47.7(H) 41.8  Platelets 150.0 - 400.0 K/uL 304.0 301 250.0   Lipid Panel     Component Value Date/Time   CHOL 151 03/20/2020 1032   TRIG 104.0 03/20/2020 1032   HDL 70.60 03/20/2020 1032   CHOLHDL 2 03/20/2020 1032   VLDL 20.8 03/20/2020 1032   LDLCALC 60 03/20/2020 1032    Ref Range & Units 1 yr ago  (02/23/18) 2 yr ago  (02/23/17) 2 yr ago  (07/14/16) 3 yr ago  (03/18/16) 4 yr ago  (01/28/15)  Cholesterol 0 - 200 mg/dL 865197  784169 CM  696EXBM314High  CM  214High  CM  220High  R   Comment: ATP III Classification       Desirable:  < 200 mg/dL               Borderline High:  200 - 239 mg/dL          High:  > = 841240 mg/dL  Triglycerides 0.0 - 324.4149.0 mg/dL 010.2118.0  725.3113.0 CM  664.4IHKV172.0High  CM  127.0 CM  259High  R  Comment: Normal:  <150 mg/dLBorderline High:   150 - 199 mg/dL  HDL >45.85 mg/dL 92.92  44.62  86.38  17.71  75 R   VLDL 0.0 - 40.0 mg/dL 16.5  79.0  38.3  33.8  52High  R   LDL Cholesterol 0 - 99 mg/dL 329VBTY   75  606YOKH   126High   93 R, CM   Total CHOL/HDL Ratio  3         HEMOGLOBIN A1C Lab Results  Component Value Date   HGBA1C 5.2 03/20/2020   MPG 100 08/04/2016   TSH Recent Labs    03/20/20 1032  TSH 2.76   Medications and allergies  No Known Allergies   Current Outpatient Medications  Medication Instructions   ALPRAZolam (XANAX) 0.5 mg, Oral, At bedtime PRN   amLODipine (NORVASC) 5 MG tablet TAKE 2 TABLETS BY MOUTH EVERY DAY   butalbital-acetaminophen-caffeine (FIORICET) 50-325-40 MG tablet 1 tablet, Oral, 2 times daily PRN   calcium carbonate (TUMS - DOSED IN MG ELEMENTAL CALCIUM) 500 MG chewable tablet 1 tablet, Oral, 2 times daily   cetirizine (ZYRTEC) 10 mg, Oral, Daily at bedtime   diclofenac (VOLTAREN) 75 mg, Oral, 2 times daily PRN   DULoxetine (CYMBALTA) 30 mg, Oral, 2 times daily   estradiol (VIVELLE-DOT) 0.1 MG/24HR patch 1 patch, Transdermal, 2 times weekly, Sunday & Thursday   ezetimibe (ZETIA) 10 mg, Oral, Daily   levothyroxine (SYNTHROID) 50 mcg, Oral, Daily before breakfast   losartan (COZAAR) 50 mg, Oral, Daily   Melatonin 10 MG TABS Oral, Daily at bedtime   rosuvastatin (CRESTOR) 5 mg, Oral, 3 times weekly   Radiology:   No results found.  Cardiac Studies:   Echocardiogram 01/11/2019: Left ventricle cavity is normal in size. Mild concentric hypertrophy of the left ventricle. Normal LV systolic function with visual EF 50-55%. Normal global wall motion. Normal diastolic filling pattern.  Trileaflet aortic valve.  Trace aortic regurgitation. Mild prolapse of the mitral valve leaflets with mild (Grade I) eccentric mitral regurgitation. Mild tricuspid regurgitation.  Mild pulmonic regurgitation. No evidence of pulmonary hypertension. Compared to previous study in 2014, valvular  regurgitation findings are new.  Lexiscan Tetrofosmin stress test 01/24/2019: No previous exam available for comparison. Lexiscan/walking nuclear stress test performed using 1-day protocol. Stress EKG is non-diagnostic, as this is pharmacological stress test. Stress EKG showed sinus tachycardia at 99% MPHR, no ischemic changes.  Myocardial perfusion imaging is normal. Stress LVEF 20%.  High risk study due to reduced LVEF. Recommend clinical correlation.  Exercise testing 07/12/2019: with gas exchange demonstrates normal functional capacity when compared to matched sedentary norms. There is no cardiopulmonary abnormality. Patient has an excessive HR response to exercise and given all other normal parameters, this could be improved with cardiovascular exercise training, however should be clinically followed.    Coronary Calcium Score 06/14/2019: Total Agatston Score: 0   MESA database percentile: 0   AORTA MEASUREMENTS:   Ascending Aorta: 32 mm   Descending Aorta: 22 mm   OTHER FINDINGS:   Heart is normal size. No adenopathy. Scarring in the right middle lobe. No confluent opacities or effusions. Imaging into the upper abdomen shows no acute findings. Bilateral breast implants noted. Heart and mediastinal contours are within normal limits. No focal opacities or effusions. No acute bony abnormality.   IMPRESSION: No visible coronary artery calcifications. Total coronary calcium score of 0.   No acute or significant extracardiac abnormality.  EKG    EKG 07/22/2020: Normal sinus rhythm  at rate of 66 bpm.  Poor R progression, probably normal variant.  No evidence of ischemia.   EKG 05/23/2019: Sinus bradycardia at rate of 58 bpm with borderline first-degree AV block, left atrial abnormality, poor R wave progression, cannot exclude anteroseptal infarct old. T wave inversion in V1 to V3, cannot exclude anteroseptal ischemia.  No significant change from 03/06/2018.  Assessment      ICD-10-CM   1. Essential hypertension  I10 EKG 12-Lead    2. Dyspnea on exertion  R06.00     3. Dizziness  R42     4. Bilateral carotid bruits  R09.89 PCV CAROTID DUPLEX (BILATERAL)    5. Mixed hyperlipidemia  E78.2       No orders of the defined types were placed in this encounter.   Medications Discontinued During This Encounter  Medication Reason   temazepam (RESTORIL) 15 MG capsule Error    Recommendations:   SANSKRITI GREENLAW  is a 70 y.o. Caucasian female  with hypertension, hypothyroidism, and probably familial hyperlipidemia (07/14/2016: Total cholesterol 04/02/2012 and LDL 222), abnormal treadmill stress test in 2014 and coronary angiography normal at that time, due to recurrent chest pain she had undergone nuclear test test in December 2020 which was normal. Felt to likely have endothelial dysfunction.  This is her annual visit, she is presently doing well and has not had any further episodes of chest pain.  She has noticed occasional episodes of dizziness and at that time she has recorded blood pressure to be low and associated dyspnea only during these episodes.  Otherwise she has remained fairly active.  She has reduced the dose of losartan to 25 mg daily, advised her to continue present dose and continue monitoring her blood pressure.  She could certainly discontinue losartan or better yet amlodipine if blood pressure continues to remain low and she has dizzy episodes.  Lipids are under excellent control, she is a super responder to statins and Zetia with marked improvement in lipid profile.  Continue the same.  She has new bilateral carotid artery bruit, will obtain carotid duplex.  Otherwise stable from cardiac standpoint I will see her back in a year.  She remained stable I will see her back on a as needed basis.

## 2020-07-31 ENCOUNTER — Other Ambulatory Visit: Payer: Medicare Other

## 2020-08-07 ENCOUNTER — Other Ambulatory Visit: Payer: Self-pay

## 2020-08-07 ENCOUNTER — Ambulatory Visit: Payer: Medicare Other

## 2020-08-07 DIAGNOSIS — R0989 Other specified symptoms and signs involving the circulatory and respiratory systems: Secondary | ICD-10-CM

## 2020-08-21 ENCOUNTER — Telehealth: Payer: Self-pay

## 2020-08-21 NOTE — Telephone Encounter (Signed)
  Pt called and her BP readings have been  167 /80 hr 82 159 / 77 hr 77   pt mentioned switching her to/or adding  amlodipine. She has not had anymore dizziness on losartan 25mg

## 2020-08-21 NOTE — Telephone Encounter (Signed)
She should go back to her original Losartan dose, I had not discontinued Amlodipine, if she has, then first restart Amlodipine prior to increasing Losartan dose.   USE [facility] MESSAGING

## 2020-10-03 ENCOUNTER — Other Ambulatory Visit: Payer: Self-pay | Admitting: Family Medicine

## 2020-10-03 DIAGNOSIS — M8949 Other hypertrophic osteoarthropathy, multiple sites: Secondary | ICD-10-CM

## 2020-10-03 DIAGNOSIS — M159 Polyosteoarthritis, unspecified: Secondary | ICD-10-CM

## 2020-11-05 ENCOUNTER — Telehealth (INDEPENDENT_AMBULATORY_CARE_PROVIDER_SITE_OTHER): Payer: Medicare Other | Admitting: Family Medicine

## 2020-11-05 ENCOUNTER — Other Ambulatory Visit: Payer: Self-pay

## 2020-11-05 DIAGNOSIS — U071 COVID-19: Secondary | ICD-10-CM | POA: Diagnosis not present

## 2020-11-05 MED ORDER — MOLNUPIRAVIR EUA 200MG CAPSULE: 4.0000 | ORAL_CAPSULE | Freq: Two times a day (BID) | ORAL | 0 refills | Status: AC

## 2020-11-05 NOTE — Progress Notes (Signed)
[facility] [address], [address] [place], [place] [address] [phone]  Date:  11/05/2020   Name:  Kristy Arias   DOB:  [birth year]   MRN:  [id number]  PCP:  Pearline Cables, MD    Chief Complaint: No chief complaint on file.   History of Present Illness:  Kristy Arias is a 70 y.o. very pleasant female patient who presents with the following:  Virtual visit today for concern of covid 19 Pt location is home, my location is office Pt ID confirmed with 2 factors she gives consent for virtual visit today  The patient and myself are present on video visit today  She noted mild sx like allergies 2 days ago- by yesterday she was still not feeling great, she did a test twice and was positive  Today she notes sx of body aches, cough, stuffy and runny nose.  She had a fever but does not seem to have a fever now  Her father died and she attended his funeral on Saturday, suspect this is where she got sick   She is using a mucinex multi- sx which is helping   Her breathing is ok She just got a bivalant booster on 9/26-she is fully vaccinated and boosted  Her symptoms are not severe, but she would be interested in antiviral in hopes of warding off more serious illness   Patient Active Problem List   Diagnosis Date Noted   Hypothyroidism (acquired) 02/26/2019   OA (osteoarthritis) of knee 08/25/2015   Memory loss 06/05/2014   Chronic migraine without aura without status migrainosus, not intractable 06/05/2014   Chronic cholecystitis with calculus s/p lap chole 06/29/2012 04/13/2012   Heartburn 04/13/2012   Hypertension 11/02/2011   Fibromuscular dysplasia (HCC) 11/02/2011   Hyperlipidemia 11/02/2011   Allergic rhinitis 11/02/2011   Arthritis 11/02/2011   Depression 11/02/2011    Past Medical History:  Diagnosis Date   Anemia    Anxiety    Arthritis    Depression    GERD (gastroesophageal reflux disease)     Glaucoma    Headache    Heart murmur    Hyperlipidemia    Hypertension    Hypothyroidism    Shortness of breath    syndrome x   Syndrome X, cardiac (HCC)     Past Surgical History:  Procedure Laterality Date   ABDOMINAL HYSTERECTOMY     BREAST ENHANCEMENT SURGERY     BREAST SURGERY     CARDIAC CATHETERIZATION  4/14   CHOLECYSTECTOMY     COSMETIC SURGERY     DILATION AND CURETTAGE OF UTERUS  2013   EYE SURGERY     LAPAROSCOPIC CHOLECYSTECTOMY SINGLE PORT N/A 06/29/2012   Procedure: LAPAROSCOPIC CHOLECYSTECTOMY SINGLE PORT IOC ;  Surgeon: Ardeth Sportsman, MD;  Location: [facility];  Service: General;  Laterality: N/A;   LEFT HEART CATHETERIZATION WITH CORONARY ANGIOGRAM N/A 05/23/2012   Procedure: LEFT HEART CATHETERIZATION WITH CORONARY ANGIOGRAM;  Surgeon: [name], MD;  Location: [facility] CATH LAB;  Service: Cardiovascular;  Laterality: N/A;   TOTAL KNEE ARTHROPLASTY Right 08/25/2015   Procedure: RIGHT TOTAL KNEE ARTHROPLASTY;  Surgeon: Ollen Gross, MD;  Location: [facility];  Service: Orthopedics;  Laterality: Right;   TOTAL KNEE ARTHROPLASTY Left 08/09/2016   Procedure: LEFT TOTAL KNEE ARTHROPLASTY;  Surgeon: Ollen Gross, MD;  Location: [facility];  Service: Orthopedics;  Laterality: Left;    Social History   Tobacco  Use   Smoking status: Never   Smokeless tobacco: Never  Vaping Use   Vaping Use: Never used  Substance Use Topics   Alcohol use: No   Drug use: No    Family History  Problem Relation Age of Onset   Hypertension Father    Diabetes Father    Heart disease Father    Hyperlipidemia Father    Hypertension Maternal Grandmother    Heart disease Maternal Grandmother    Diabetes Maternal Grandfather    Hyperlipidemia Maternal Grandfather    Heart disease Maternal Grandfather    Osteoporosis Paternal Grandmother    Heart disease Paternal Grandmother    Heart disease Paternal Grandfather    Cancer Mother    Hyperlipidemia Mother    Hypertension Mother    Cancer  Sister     No Known Allergies  Medication list has been reviewed and updated.  Current Outpatient Medications on File Prior to Visit  Medication Sig Dispense Refill   ALPRAZolam (XANAX) 0.5 MG tablet Take 1 tablet (0.5 mg total) by mouth at bedtime as needed for anxiety. (Patient taking differently: Take 0.5 mg by mouth 3 (three) times daily as needed for anxiety.) 30 tablet 2   amLODipine (NORVASC) 5 MG tablet TAKE 2 TABLETS BY MOUTH EVERY DAY 180 tablet 3   butalbital-acetaminophen-caffeine (FIORICET) 50-325-40 MG tablet Take 1 tablet by mouth 2 (two) times daily as needed.     calcium carbonate (TUMS - DOSED IN MG ELEMENTAL CALCIUM) 500 MG chewable tablet Chew 1 tablet by mouth 2 (two) times daily.     cetirizine (ZYRTEC) 10 MG tablet Take 10 mg by mouth at bedtime.     diclofenac (VOLTAREN) 75 MG EC tablet TAKE 1 TABLET BY MOUTH 2 TIMES DAILY AS NEEDED FOR moderate PAIN 180 tablet 2   DULoxetine (CYMBALTA) 30 MG capsule Take 1 capsule (30 mg total) by mouth 2 (two) times daily. 60 capsule 5   estradiol (VIVELLE-DOT) 0.1 MG/24HR patch Place 1 patch onto the skin 2 (two) times a week. Sunday & Thursday     ezetimibe (ZETIA) 10 MG tablet Take 1 tablet (10 mg total) by mouth daily. 90 tablet 3   levothyroxine (SYNTHROID) 50 MCG tablet Take 1 tablet (50 mcg total) by mouth daily before breakfast. 90 tablet 3   losartan (COZAAR) 50 MG tablet Take 1 tablet (50 mg total) by mouth daily. 90 tablet 3   Melatonin 10 MG TABS Take by mouth at bedtime.     rosuvastatin (CRESTOR) 5 MG tablet Take 1 tablet (5 mg total) by mouth 3 (three) times a week. 90 tablet 3   No current facility-administered medications on file prior to visit.    Review of Systems:  As per HPI- otherwise negative.   Physical Examination: There were no vitals filed for this visit. There were no vitals filed for this visit. There is no height or weight on file to calculate BMI. Ideal Body Weight:    Patient observed over  video monitor.  She looks well, her normal self.  No distress or shortness of breath.  She is not currently checking vital signs  Assessment and Plan: COVID-19 - Plan: molnupiravir EUA (LAGEVRIO) 200 mg CAPS capsule  Patient diagnosed with COVID-19.  We will have her start molnupiravir antiviral.  Discussed supportive care, quarantine protocol She will let me know if any worsening or other concerns  Signed Abbe Amsterdam, MD

## 2020-12-25 ENCOUNTER — Other Ambulatory Visit: Payer: Self-pay | Admitting: Family Medicine

## 2020-12-25 DIAGNOSIS — E039 Hypothyroidism, unspecified: Secondary | ICD-10-CM

## 2021-02-18 ENCOUNTER — Other Ambulatory Visit: Payer: Self-pay | Admitting: Family Medicine

## 2021-02-18 DIAGNOSIS — I10 Essential (primary) hypertension: Secondary | ICD-10-CM

## 2021-03-22 NOTE — Patient Instructions (Addendum)
It was great to see you again today.   ?Will plan further follow- up pending labs. ?Suggest getting shingles vaccine at your pharmacy ?Please stop using cymbalta and change over to effexor 37.5,  can increase to 2 tablets after 2 weeks if you like ? ?We might think about trying Strattera if this does not work for you ? ?I placed a neurology referral so you can have a formal memory evaluation ? ?Please let me know if you need any help setting up your colonoscopy  ? ?

## 2021-03-22 NOTE — Progress Notes (Addendum)
[facility] [address] [place], [place] [address] [phone]  Date:  03/25/2021   Name:  Kristy Arias   DOB:  [birth year]   MRN:  [id number]  PCP:  Pearline Cables, MD    Chief Complaint: Annual Exam (Concerns/ questions: 1. sleep issues due to depression meds 2. Asks about Dayvigo. 3. Asks about a urine marker for Alzheimer's. /Flu shot today: received. Kathreen Cornfield: none in ncir- would like today/Cologard: due, would like colonoscopy )   History of Present Illness:  Kristy Arias is a 71 y.o. very pleasant female patient who presents with the following:  Patient seen today for physical exam- History of hypertension, migraine, hyperlipidemia, hypothyroidism Most recent visit with myself was a virtual visit in October for COVID Otherwise we saw each other about 1 year ago  She is also followed by ophthalmology for open-angle glaucoma Her cardiologist is Dr. Dulcy Fanny recent visit in June: Kristy Arias  is a 71 y.o. Caucasian female  with hypertension, hypothyroidism, and probably familial hyperlipidemia (07/14/2016: Total cholesterol 04/02/2012 and LDL 222), abnormal treadmill stress test in 2014 and coronary angiography normal at that time, due to recurrent chest pain she had undergone nuclear test test in December 2020 which was normal. Felt to likely have endothelial dysfunction. She has noticed occasional episodes of dizziness and at that time she has recorded blood pressure to be low and associated dyspnea only during these episodes.  Otherwise she has remained fairly active.  She has reduced the dose of losartan to 25 mg daily, advised her to continue present dose and continue monitoring her blood pressure.  She could certainly discontinue losartan or better yet amlodipine if blood pressure continues to remain low and she has dizzy episodes. Lipids are under excellent control, she is a super responder to statins and Zetia  with marked improvement in lipid profile.  Continue the same. She has new bilateral carotid artery bruit, will obtain carotid duplex. Otherwise stable from cardiac standpoint I will see her back in a year  Shingrix- recommended  COVID-19 booster- done  Flu shot- done  Cologuard due for update- she would like to actually do a colonoscopy this time.  Her last colon was done by Dr Kinnie Scales- she will call his office and schedule  Can update labs today  She is seeing Dr Jennette Kettle for her GYN care but he is retiring   She has reduced her Cymbalta to 30 mg daily in hopes it might reduce insomnia This has helped but she continues to struggle with sleep  She did take wellbutrin in the past as well She tends to have some anxiety and depression sx    She is concerned about her memory Notes some difficulty    Patient Active Problem List   Diagnosis Date Noted   Hypothyroidism (acquired) 02/26/2019   OA (osteoarthritis) of knee 08/25/2015   Memory loss 06/05/2014   Chronic migraine without aura without status migrainosus, not intractable 06/05/2014   Chronic cholecystitis with calculus s/p lap chole 06/29/2012 04/13/2012   Heartburn 04/13/2012   Hypertension 11/02/2011   Fibromuscular dysplasia (HCC) 11/02/2011   Hyperlipidemia 11/02/2011   Allergic rhinitis 11/02/2011   Arthritis 11/02/2011   Depression 11/02/2011    Past Medical History:  Diagnosis Date   Anemia    Anxiety    Arthritis    Depression    GERD (gastroesophageal reflux disease)    Glaucoma  Headache    Heart murmur    Hyperlipidemia    Hypertension    Hypothyroidism    Shortness of breath    syndrome x   Syndrome X, cardiac [facility](HCC)     Past Surgical History:  Procedure Laterality Date   ABDOMINAL HYSTERECTOMY     BREAST ENHANCEMENT SURGERY     BREAST SURGERY     CARDIAC CATHETERIZATION  4/14   CHOLECYSTECTOMY     COSMETIC SURGERY     DILATION AND CURETTAGE OF UTERUS  2013   EYE SURGERY     LAPAROSCOPIC  CHOLECYSTECTOMY SINGLE PORT N/A 06/29/2012   Procedure: LAPAROSCOPIC CHOLECYSTECTOMY SINGLE PORT IOC ;  Surgeon: Ardeth SportsmanSteven C. Gross, MD;  Location: [facility];  Service: General;  Laterality: N/A;   LEFT HEART CATHETERIZATION WITH CORONARY ANGIOGRAM N/A 05/23/2012   Procedure: LEFT HEART CATHETERIZATION WITH CORONARY ANGIOGRAM;  Surgeon: Pamella PertJagadeesh R Ganji, MD;  Location: [facility];  Service: Cardiovascular;  Laterality: N/A;   TOTAL KNEE ARTHROPLASTY Right 08/25/2015   Procedure: RIGHT TOTAL KNEE ARTHROPLASTY;  Surgeon: Ollen GrossFrank Aluisio, MD;  Location: [facility];  Service: Orthopedics;  Laterality: Right;   TOTAL KNEE ARTHROPLASTY Left 08/09/2016   Procedure: LEFT TOTAL KNEE ARTHROPLASTY;  Surgeon: Ollen GrossAluisio, Frank, MD;  Location: [facility];  Service: Orthopedics;  Laterality: Left;    Social History   Tobacco Use   Smoking status: Never   Smokeless tobacco: Never  Vaping Use   Vaping Use: Never used  Substance Use Topics   Alcohol use: No   Drug use: No    Family History  Problem Relation Age of Onset   Hypertension Father    Diabetes Father    Heart disease Father    Hyperlipidemia Father    Hypertension Maternal Grandmother    Heart disease Maternal Grandmother    Diabetes Maternal Grandfather    Hyperlipidemia Maternal Grandfather    Heart disease Maternal Grandfather    Osteoporosis Paternal Grandmother    Heart disease Paternal Grandmother    Heart disease Paternal Grandfather    Cancer Mother    Hyperlipidemia Mother    Hypertension Mother    Cancer Sister     No Known Allergies  Medication list has been reviewed and updated.  Current Outpatient Medications on File Prior to Visit  Medication Sig Dispense Refill   ALPRAZolam (XANAX) 0.5 MG tablet Take 1 tablet (0.5 mg total) by mouth at bedtime as needed for anxiety. (Patient taking differently: Take 0.5 mg by mouth 3 (three) times daily as needed for anxiety.) 30 tablet 2   amLODipine (NORVASC) 5 MG tablet TAKE 2 TABLETS BY MOUTH  EVERY DAY 180 tablet 3   butalbital-acetaminophen-caffeine (FIORICET) 50-325-40 MG tablet Take 1 tablet by mouth 2 (two) times daily as needed.     calcium carbonate (TUMS - DOSED IN MG ELEMENTAL CALCIUM) 500 MG chewable tablet Chew 1 tablet by mouth 2 (two) times daily.     cetirizine (ZYRTEC) 10 MG tablet Take 10 mg by mouth at bedtime.     diclofenac (VOLTAREN) 75 MG EC tablet TAKE 1 TABLET BY MOUTH 2 TIMES DAILY AS NEEDED FOR moderate PAIN 180 tablet 2   DULoxetine (CYMBALTA) 30 MG capsule Take 1 capsule (30 mg total) by mouth 2 (two) times daily. 60 capsule 5   estradiol (VIVELLE-DOT) 0.1 MG/24HR patch Place 1 patch onto the skin 2 (two) times a week. Sunday & Thursday     ezetimibe (ZETIA) 10 MG tablet Take 1 tablet (10 mg  total) by mouth daily. 90 tablet 3   levothyroxine (SYNTHROID) 50 MCG tablet Take 1 tablet (50 mcg total) by mouth daily before breakfast. 90 tablet 3   losartan (COZAAR) 50 MG tablet Take 1 tablet (50 mg total) by mouth daily. 90 tablet 3   Melatonin 10 MG TABS Take by mouth at bedtime.     rosuvastatin (CRESTOR) 5 MG tablet Take 1 tablet (5 mg total) by mouth 3 (three) times a week. 90 tablet 3   UNABLE TO FIND Med Name: Delta 8     No current facility-administered medications on file prior to visit.    Review of Systems:  As per HPI- otherwise negative.   Physical Examination: Vitals:   03/25/21 1043  BP: 110/60  Pulse: 77  Resp: 18  Temp: 97.7 F (36.5 C)  SpO2: 98%   Vitals:   03/25/21 1043  Weight: 122 lb 6.4 oz (55.5 kg)  Height: 5\' 2"  (1.575 m)   Body mass index is 22.39 kg/m. Ideal Body Weight: Weight in (lb) to have BMI = 25: 136.4  GEN: no acute distress.  Slender build, looks well HEENT: Atraumatic, Normocephalic.  Bilateral TM wnl, oropharynx normal.  PEERL,EOMI.   Ears and Nose: No external deformity. CV: RRR, No M/G/R. No JVD. No thrill. No extra heart sounds. PULM: CTA B, no wheezes, crackles, rhonchi. No retractions. No resp.  distress. No accessory muscle use. ABD: S, NT, ND, +BS. No rebound. No HSM. EXTR: No c/c/e PSYCH: Normally interactive. Conversant.    Assessment and Plan: Physical exam  Hypothyroidism (acquired) - Plan: TSH  Essential hypertension - Plan: CBC, Comprehensive metabolic panel  Screening for diabetes mellitus - Plan: Comprehensive metabolic panel, Hemoglobin A1c  Mixed hyperlipidemia - Plan: Lipid panel  Fatigue, unspecified type - Plan: TSH, VITAMIN D 25 Hydroxy (Vit-D Deficiency, Fractures)  Persistent depressive disorder - Plan: venlafaxine XR (EFFEXOR XR) 37.5 MG 24 hr capsule  Memory change - Plan: Ambulatory referral to Neurology  Physical exam today.  Encouraged healthy diet and exercise routine Will plan further follow- up pending labs. Her mother has Alzheimer's disease, patient admits this is on her mind.  Referral to neurology for further testing  She is using Cymbalta for depression and anxiety but thinks it may contribute to insomnia.  We will have her stop using Cymbalta and change over to Effexor 37.5.  Can increase to 75 after about 2 weeks if desired  Patient also wonders if she could have ADHD.  We discussed possibly trying Strattera for her at some point in the future We also discussed using a nonhypnotic sleep aid such as Dayvigo-she will keep this idea in mind  She plans to contact her GI office to set up her colonoscopy Signed , MD  Received her labs as below, message to patient Results for orders placed or performed in visit on 03/25/21  CBC  Result Value Ref Range   WBC 5.3 4.0 - 10.5 K/uL   RBC 4.17 3.87 - 5.11 Mil/uL   Platelets 279.0 150.0 - 400.0 K/uL   Hemoglobin 13.8 12.0 - 15.0 g/dL   HCT 05/25/21 42.6 - 83.4 %   MCV 97.3 78.0 - 100.0 fl   MCHC 33.9 30.0 - 36.0 g/dL   RDW 19.6 22.2 - 97.9 %  Comprehensive metabolic panel  Result Value Ref Range   Sodium 136 135 - 145 mEq/L   Potassium 3.9 3.5 - 5.1 mEq/L   Chloride 102 96 -  112 mEq/L  CO2 27 19 - 32 mEq/L   Glucose, Bld 88 70 - 99 mg/dL   BUN 14 6 - 23 mg/dL   Creatinine, Ser 5.40 0.40 - 1.20 mg/dL   Total Bilirubin 0.6 0.2 - 1.2 mg/dL   Alkaline Phosphatase 53 39 - 117 U/L   AST 18 0 - 37 U/L   ALT 16 0 - 35 U/L   Total Protein 6.5 6.0 - 8.3 g/dL   Albumin 4.0 3.5 - 5.2 g/dL   GFR 98.11 >91.47 mL/min   Calcium 9.1 8.4 - 10.5 mg/dL  Hemoglobin W2N  Result Value Ref Range   Hgb A1c MFr Bld 5.4 4.6 - 6.5 %  Lipid panel  Result Value Ref Range   Cholesterol 180 0 - 200 mg/dL   Triglycerides 562.1 0.0 - 149.0 mg/dL   HDL 30.86 >57.84 mg/dL   VLDL 69.6 0.0 - 29.5 mg/dL   LDL Cholesterol 87 0 - 99 mg/dL   Total CHOL/HDL Ratio 3    NonHDL 108.98   TSH  Result Value Ref Range   TSH 2.92 0.35 - 5.50 uIU/mL  VITAMIN D 25 Hydroxy (Vit-D Deficiency, Fractures)  Result Value Ref Range   VITD 90.19 30.00 - 100.00 ng/mL

## 2021-03-25 ENCOUNTER — Encounter: Payer: Self-pay | Admitting: Family Medicine

## 2021-03-25 ENCOUNTER — Ambulatory Visit (INDEPENDENT_AMBULATORY_CARE_PROVIDER_SITE_OTHER): Payer: Medicare Other | Admitting: Family Medicine

## 2021-03-25 VITALS — BP 110/60 | HR 77 | Temp 97.7°F | Resp 18 | Ht 62.0 in | Wt 122.4 lb

## 2021-03-25 DIAGNOSIS — R413 Other amnesia: Secondary | ICD-10-CM

## 2021-03-25 DIAGNOSIS — E782 Mixed hyperlipidemia: Secondary | ICD-10-CM | POA: Diagnosis not present

## 2021-03-25 DIAGNOSIS — Z131 Encounter for screening for diabetes mellitus: Secondary | ICD-10-CM

## 2021-03-25 DIAGNOSIS — Z0000 Encounter for general adult medical examination without abnormal findings: Secondary | ICD-10-CM | POA: Diagnosis not present

## 2021-03-25 DIAGNOSIS — E039 Hypothyroidism, unspecified: Secondary | ICD-10-CM

## 2021-03-25 DIAGNOSIS — F341 Dysthymic disorder: Secondary | ICD-10-CM

## 2021-03-25 DIAGNOSIS — I10 Essential (primary) hypertension: Secondary | ICD-10-CM

## 2021-03-25 DIAGNOSIS — R5383 Other fatigue: Secondary | ICD-10-CM

## 2021-03-25 LAB — LIPID PANEL
Cholesterol: 180 mg/dL (ref 0–200)
HDL: 71.2 mg/dL (ref >39.00)
LDL Cholesterol: 87 mg/dL (ref 0–99)
NonHDL: 108.98
Total CHOL/HDL Ratio: 3
Triglycerides: 109 mg/dL (ref 0.0–149.0)
VLDL: 21.8 mg/dL (ref 0.0–40.0)

## 2021-03-25 LAB — CBC
HCT: 40.6 % (ref 36.0–46.0)
Hemoglobin: 13.8 g/dL (ref 12.0–15.0)
MCHC: 33.9 g/dL (ref 30.0–36.0)
MCV: 97.3 fl (ref 78.0–100.0)
Platelets: 279 10*3/uL (ref 150.0–400.0)
RBC: 4.17 Mil/uL (ref 3.87–5.11)
RDW: 13.2 % (ref 11.5–15.5)
WBC: 5.3 10*3/uL (ref 4.0–10.5)

## 2021-03-25 LAB — COMPREHENSIVE METABOLIC PANEL
ALT: 16 U/L (ref 0–35)
AST: 18 U/L (ref 0–37)
Albumin: 4 g/dL (ref 3.5–5.2)
Alkaline Phosphatase: 53 U/L (ref 39–117)
BUN: 14 mg/dL (ref 6–23)
CO2: 27 mEq/L (ref 19–32)
Calcium: 9.1 mg/dL (ref 8.4–10.5)
Chloride: 102 mEq/L (ref 96–112)
Creatinine, Ser: 0.86 mg/dL (ref 0.40–1.20)
GFR: 69.34 mL/min (ref >60.00)
Glucose, Bld: 88 mg/dL (ref 70–99)
Potassium: 3.9 mEq/L (ref 3.5–5.1)
Sodium: 136 mEq/L (ref 135–145)
Total Bilirubin: 0.6 mg/dL (ref 0.2–1.2)
Total Protein: 6.5 g/dL (ref 6.0–8.3)

## 2021-03-25 LAB — TSH: TSH: 2.92 u[IU]/mL (ref 0.35–5.50)

## 2021-03-25 LAB — VITAMIN D 25 HYDROXY (VIT D DEFICIENCY, FRACTURES): VITD: 90.19 ng/mL (ref 30.00–100.00)

## 2021-03-25 LAB — HEMOGLOBIN A1C: Hgb A1c MFr Bld: 5.4 % (ref 4.6–6.5)

## 2021-03-25 MED ORDER — VENLAFAXINE HCL ER 37.5 MG PO CP24: 37.5000 mg | ORAL_CAPSULE | Freq: Every day | ORAL | 2 refills | Status: DC

## 2021-03-26 ENCOUNTER — Encounter: Payer: Self-pay | Admitting: Physician Assistant

## 2021-03-30 ENCOUNTER — Encounter: Payer: Self-pay | Admitting: Family Medicine

## 2021-04-02 ENCOUNTER — Ambulatory Visit (INDEPENDENT_AMBULATORY_CARE_PROVIDER_SITE_OTHER): Payer: Medicare Other | Admitting: Physician Assistant

## 2021-04-02 ENCOUNTER — Encounter: Payer: Self-pay | Admitting: Physician Assistant

## 2021-04-02 ENCOUNTER — Other Ambulatory Visit: Payer: Self-pay

## 2021-04-02 ENCOUNTER — Other Ambulatory Visit (INDEPENDENT_AMBULATORY_CARE_PROVIDER_SITE_OTHER): Payer: Medicare Other

## 2021-04-02 VITALS — BP 107/69 | HR 75 | Resp 20 | Ht 61.5 in | Wt 124.0 lb

## 2021-04-02 DIAGNOSIS — R413 Other amnesia: Secondary | ICD-10-CM

## 2021-04-02 LAB — VITAMIN B12: Vitamin B-12: 133 pg/mL — ABNORMAL LOW (ref 211–911)

## 2021-04-02 NOTE — Progress Notes (Addendum)
Assessment/Plan:   Kristy Arias is a very pleasant 71 y.o. year old RH female with  a history of hypertension, hyperlipidemia,hypothyroidism, chronic migraines, arthritis, anxiety, depression seen today for evaluation of memory loss. MoCA today is 27/30 with normal delayed recall, orientation and memory, attention visuospatial executive.  Family history remarkable for Alzheimer's dementia in mother, patient is concerned about the possibility of developing dementia.   Recommendations:   Memory difficulties   MRI brain with/without contrast to assess for underlying structural abnormality and assess vascular load  Neurocognitive testing to further evaluate cognitive concerns and determine other underlying cause of memory changes, including potential contribution from sleep, anxiety, attention deficit or depression. Check B12 Discussed safety both in and out of the home.  Discussed the importance of regular daily schedule with inclusion of crossword puzzles to maintain brain function.  Continue to monitor mood with PCP.  Stay active at least 30 minutes at least 3 times a week.  Naps should be scheduled and should be no longer than 60 minutes and should not occur after 2 PM.  Control cardiovascular risk factors  Mediterranean diet is recommended  Folllow up in 1 month No indication for antidementia medication at this time  Subjective:    The patient is seen in neurologic consultation at the request of Copland, Gwenlyn FoundJessica C, MD for the evaluation of memory.  The patient is here alone. This is a 71 y.o. year old RH  female who has had memory issues for about 7 years.  She was initially seen here in 2016, with negative work-up.  [facility], with same concerns, but again work-up was negative, no medications were indicated.  She reports that since her mother died in 2020, she is concerned that she may develop dementia "I am paranoid about having a diagnosis because of my  mom ". She reports that her father died in September of last year, and that has had an effect on her depression.  Her best friend has been diagnosed with Alzheimer's disease as well at age 69 which brought more concerns.  She states that at times she repeats herself, she is "very bad with names, and this is worse lately, my brain is in overload, and bouncing all over the place, I am off on another tangent ".  She is concerned about ADHD although she has never been diagnosed with it formally.  I think of tasks to need to do, and do something else instead.  No recent infections, but she did have COVID in October 2022, with "COVID fog for 2 months, I could not remember the names of the books I read but now I am back to normal ". She lives with her husband, who does not note any changes.  Her mood is stable, but she did have moments of depression, with changes in her medications from Cymbalta (which was causing insomnia), now on Effexor and she can sleep well, without REM behavior, sleepwalking, hallucinations or paranoia.  She has been retired since 2017, but remains active, she likes to walk, goes to the gym frequently with her friends.  She continues to drive, denies getting lost.  She denies being disoriented when walking into her room.  She denies leaving objects in unusual places, but frequently she forgets the wallet or the phone in the home after leaving the house.  She is independent of bathing and dressing.  Her medications are in a pillbox, and denies missing any.  Her husband is in charge of the finances, as "mouth is harder with age ".  Her appetite is normal, denies trouble swallowing.  She cooks, has remodeled her kitchen, and learning to use a new opening, accidentally leaving it on.  She has a history of migraine headaches, starting in her teenage years, with a frequency of 1 or 2 a week, 2 times a month, takes Imitrex for severe headaches, otherwise controlled with Tylenol.  She denies double vision,  dizziness, focal numbness or tingling, unilateral weakness, tremors or anosmia.  No history of seizures.  Denies urine incontinence, retention, constipation or diarrhea, OSA, alcohol or tobacco abuse.  As mentioned above, mother died with Alzheimer's disease.  She is a retired Teacher, early years/pre.      Labs TSH 2.76, D 0.19, lipid panel , CMP, CBC normal, A1C 5.4,   No Known Allergies  Current Outpatient Medications  Medication Instructions   ALPRAZolam (XANAX) 0.5 mg, Oral, At bedtime PRN   amLODipine (NORVASC) 5 MG tablet TAKE 2 TABLETS BY MOUTH EVERY DAY   butalbital-acetaminophen-caffeine (FIORICET) 50-325-40 MG tablet 1 tablet, Oral, 2 times daily PRN   calcium carbonate (TUMS - DOSED IN MG ELEMENTAL CALCIUM) 500 MG chewable tablet 1 tablet, Oral, 2 times daily   cetirizine (ZYRTEC) 10 mg, Oral, Daily at bedtime   diclofenac (VOLTAREN) 75 MG EC tablet TAKE 1 TABLET BY MOUTH 2 TIMES DAILY AS NEEDED FOR moderate PAIN   estradiol (VIVELLE-DOT) 0.1 MG/24HR patch 1 patch, Transdermal, 2 times weekly, Sunday & Thursday   ezetimibe (ZETIA) 10 mg, Oral, Daily   levothyroxine (SYNTHROID) 50 mcg, Oral, Daily before breakfast   losartan (COZAAR) 50 mg, Oral, Daily   Melatonin 10 MG TABS Oral, Daily at bedtime   rosuvastatin (CRESTOR) 5 mg, Oral, 3 times weekly   UNABLE TO FIND Med Name: Delta 8   venlafaxine XR (EFFEXOR XR) 37.5 mg, Oral, Daily with breakfast, Can increase to 75 mg in 2 weeks     VITALS:   Vitals:   04/02/21 0943  BP: 107/69  Pulse: 75  Resp: 20  SpO2: 95%  Weight: 124 lb (56.2 kg)  Height: 5' 1.5" (1.562 m)   Depression screen [facility] 2/9 03/25/2021 03/20/2020 02/23/2017 08/26/2016 01/28/2015  Decreased Interest 0 0 0 1 0  Down, Depressed, Hopeless 0 0 0 0 0  PHQ - 2 Score 0 0 0 1 0    PHYSICAL EXAM   HEENT:  Normocephalic, atraumatic. The mucous membranes are moist. The superficial temporal arteries are without ropiness or tenderness. Cardiovascular: Regular rate and  rhythm. Lungs: Clear to auscultation bilaterally. Neck: There are no carotid bruits noted bilaterally.  NEUROLOGICAL: Montreal Cognitive Assessment  04/02/2021 05/31/2014  Visuospatial/ Executive (0/5) 4 5  Naming (0/3) 3 3  Attention: Read list of digits (0/2) 2 2  Attention: Read list of letters (0/1) 1 1  Attention: Serial 7 subtraction starting at 100 (0/3) 3 3  Language: Repeat phrase (0/2) 1 2  Language : Fluency (0/1) 1 1  Abstraction (0/2) 2 2  Delayed Recall (0/5) 5 5  Orientation (0/6) 5 6  Total 27 30  Adjusted Score (based on education) 27 30   No flowsheet data found.  No flowsheet data found.   Orientation:  Alert and oriented to person, place and time. No aphasia or dysarthria. Fund of knowledge is appropriate. Recent memory impaired and remote memory intact.  Attention and concentration are normal.  Able to name objects and repeat phrases. Delayed recall 5/5  Cranial nerves: There is good facial symmetry. Extraocular muscles are intact and visual fields are full to confrontational testing. Speech is fluent and clear. Soft palate rises symmetrically and there is no tongue deviation. Hearing is intact to conversational tone. Tone: Tone is good throughout. Sensation: Sensation is intact to light touch and pinprick throughout. Vibration is intact at the bilateral big toe.There is no extinction with double simultaneous stimulation. There is no sensory dermatomal level identified. Coordination: The patient has no difficulty with RAM's or FNF bilaterally. Normal finger to nose  Motor: Strength is 5/5 in the bilateral upper and lower extremities. There is no pronator drift. There are no fasciculations noted. DTR's: Deep tendon reflexes are 2/4 at the bilateral biceps, triceps, brachioradialis, patella and achilles.  Plantar responses are downgoing bilaterally. Gait and Station: The patient is able to ambulate without difficulty.The patient is able to heel toe walk without any  difficulty.The patient is able to ambulate in a tandem fashion. The patient is able to stand in the Romberg position.     Thank you for allowing [place] the opportunity to participate in the care of this nice patient. Please do not hesitate to contact us for any questions or concerns.   Total time spent on today's visit was 45 minutes, including both face-to-face time and nonface-to-face time.  Time included that spent on review of records (prior notes available to me/labs/imaging if pertinent), discussing treatment and goals, answering patient's questions and coordinating care.  Cc:  Copland, Gwenlyn Found, MD  Marlowe Kays 04/02/2021 10:58 AM

## 2021-04-02 NOTE — Patient Instructions (Addendum)
It was a pleasure to see you today at our office.   Recommendations:  Neurocognitive evaluation at our office MRI of the brain, the radiology office will call you to arrange you appointment Check labs today. Feel free to go to the following database for funded clinical studies conducted around the world: http://saunders.com/  Follow up in 1 months   RECOMMENDATIONS FOR ALL PATIENTS WITH MEMORY PROBLEMS: 1. Continue to exercise (Recommend 30 minutes of walking everyday, or 3 hours every week) 2. Increase social interactions - continue going to [place] and enjoy social gatherings with friends and family 3. Eat healthy, avoid fried foods and eat more fruits and vegetables 4. Maintain adequate blood pressure, blood sugar, and blood cholesterol level. Reducing the risk of stroke and cardiovascular disease also helps promoting better memory. 5. Avoid stressful situations. Live a simple life and avoid aggravations. Organize your time and prepare for the next day in anticipation. 6. Sleep well, avoid any interruptions of sleep and avoid any distractions in the bedroom that may interfere with adequate sleep quality 7. Avoid sugar, avoid sweets as there is a strong link between excessive sugar intake, diabetes, and cognitive impairment We discussed the Mediterranean diet, which has been shown to help patients reduce the risk of progressive memory disorders and reduces cardiovascular risk. This includes eating fish, eat fruits and green leafy vegetables, nuts like almonds and hazelnuts, walnuts, and also use olive oil. Avoid fast foods and fried foods as much as possible. Avoid sweets and sugar as sugar use has been linked to worsening of memory function.  There is always a concern of gradual progression of memory problems. If this is the case, then we may need to adjust level of care according to patient needs. Support, both to the patient and caregiver, should then be put into place.      You  have been referred for a neuropsychological evaluation (i.e., evaluation of memory and thinking abilities). Please bring someone with you to this appointment if possible, as it is helpful for the doctor to hear from both you and another adult who knows you well. Please bring eyeglasses and hearing aids if you wear them.    The evaluation will take approximately 3 hours and has two parts:   The first part is a clinical interview with the neuropsychologist (Dr. Melvyn Novas or Dr. Nicole Kindred). During the interview, the neuropsychologist will speak with you and the individual you brought to the appointment.    The second part of the evaluation is testing with the doctor's technician Hinton Dyer or Maudie Mercury). During the testing, the technician will ask you to remember different types of material, solve problems, and answer some questionnaires. Your family member will not be present for this portion of the evaluation.   Please note: We must reserve several hours of the neuropsychologist's time and the psychometrician's time for your evaluation appointment. As such, there is a No-Show fee of $100. If you are unable to attend any of your appointments, please contact our office as soon as possible to reschedule.    FALL PRECAUTIONS: Be cautious when walking. Scan the area for obstacles that may increase the risk of trips and falls. When getting up in the mornings, sit up at the edge of the bed for a few minutes before getting out of bed. Consider elevating the bed at the head end to avoid drop of blood pressure when getting up. Walk always in a well-lit room (use night lights in the walls). Avoid area rugs or  power cords from appliances in the middle of the walkways. Use a walker or a cane if necessary and consider physical therapy for balance exercise. Get your eyesight checked regularly.  FINANCIAL OVERSIGHT: Supervision, especially oversight when making financial decisions or transactions is also recommended.  HOME SAFETY:  Consider the safety of the kitchen when operating appliances like stoves, microwave oven, and blender. Consider having supervision and share cooking responsibilities until no longer able to participate in those. Accidents with firearms and other hazards in the house should be identified and addressed as well.   ABILITY TO BE LEFT ALONE: If patient is unable to contact 911 operator, consider using LifeLine, or when the need is there, arrange for someone to stay with patients. Smoking is a fire hazard, consider supervision or cessation. Risk of wandering should be assessed by caregiver and if detected at any point, supervision and safe proof recommendations should be instituted.  MEDICATION SUPERVISION: Inability to self-administer medication needs to be constantly addressed. Implement a mechanism to ensure safe administration of the medications.   DRIVING: Regarding driving, in patients with progressive memory problems, driving will be impaired. We advise to have someone else do the driving if trouble finding directions or if minor accidents are reported. Independent driving assessment is available to determine safety of driving.   If you are interested in the driving assessment, you can contact the following:  [employer] in [place]  [place] [phone] or [phone]    [place] refers to food and lifestyle choices that are based on the traditions of countries located on the [employer]. This way of eating has been shown to help prevent certain conditions and improve outcomes for people who have chronic diseases, like kidney disease and heart disease. What are tips for following this plan? Lifestyle  Cook and eat meals together with your family, when possible. Drink enough fluid to keep your urine clear or pale yellow. Be  physically active every day. This includes: Aerobic exercise like running or swimming. Leisure activities like gardening, walking, or housework. Get 7-8 hours of sleep each night. If recommended by your health care provider, drink red wine in moderation. This means 1 glass a day for nonpregnant women and 2 glasses a day for men. A glass of wine equals 5 oz (150 mL). Reading food labels  Check the serving size of packaged foods. For foods such as rice and pasta, the serving size refers to the amount of cooked product, not dry. Check the total fat in packaged foods. Avoid foods that have saturated fat or trans fats. Check the ingredients list for added sugars, such as corn syrup. Shopping  At the grocery store, buy most of your food from the areas near the walls of the store. This includes: Fresh fruits and vegetables (produce). Grains, beans, nuts, and seeds. Some of these may be available in unpackaged forms or large amounts (in bulk). Fresh seafood. Poultry and eggs. Low-fat dairy products. Buy whole ingredients instead of prepackaged foods. Buy fresh fruits and vegetables in-season from local farmers markets. Buy frozen fruits and vegetables in resealable bags. If you do not have access to quality fresh seafood, buy precooked frozen shrimp or canned fish, such as tuna, salmon, or sardines. Buy small amounts of raw or cooked vegetables, salads, or olives from the deli or salad bar at your store. Stock your pantry so you always have certain foods on  hand, such as olive oil, canned tuna, canned tomatoes, rice, pasta, and beans. Cooking  Cook foods with extra-virgin olive oil instead of using butter or other vegetable oils. Have meat as a side dish, and have vegetables or grains as your main dish. This means having meat in small portions or adding small amounts of meat to foods like pasta or stew. Use beans or vegetables instead of meat in common dishes like chili or lasagna. Experiment with  different cooking methods. Try roasting or broiling vegetables instead of steaming or sauteing them. Add frozen vegetables to soups, stews, pasta, or rice. Add nuts or seeds for added healthy fat at each meal. You can add these to yogurt, salads, or vegetable dishes. Marinate fish or vegetables using olive oil, lemon juice, garlic, and fresh herbs. Meal planning  Plan to eat 1 vegetarian meal one day each week. Try to work up to 2 vegetarian meals, if possible. Eat seafood 2 or more times a week. Have healthy snacks readily available, such as: Vegetable sticks with hummus. Greek yogurt. Fruit and nut trail mix. Eat balanced meals throughout the week. This includes: Fruit: 2-3 servings a day Vegetables: 4-5 servings a day Low-fat dairy: 2 servings a day Fish, poultry, or lean meat: 1 serving a day Beans and legumes: 2 or more servings a week Nuts and seeds: 1-2 servings a day Whole grains: 6-8 servings a day Extra-virgin olive oil: 3-4 servings a day Limit red meat and sweets to only a few servings a month What are my food choices? Mediterranean diet Recommended Grains: Whole-grain pasta. Brown rice. Bulgar wheat. Polenta. Couscous. Whole-wheat bread. [place]. Vegetables: Artichokes. Beets. Broccoli. Cabbage. Carrots. Eggplant. Green beans. Chard. Kale. Spinach. Onions. Leeks. Peas. Squash. Tomatoes. Peppers. Radishes. Fruits: Apples. Apricots. Avocado. Berries. Bananas. Cherries. Dates. Figs. Grapes. Lemons. Melon. Oranges. Peaches. Plums. Pomegranate. Meats and other protein foods: Beans. Almonds. Sunflower seeds. Pine nuts. Peanuts. [place]. Salmon. Scallops. Shrimp. Orange. Tilapia. Clams. Oysters. Eggs. Dairy: Low-fat milk. Cheese. Greek yogurt. Beverages: Water. Red wine. Herbal tea. Fats and oils: Extra virgin olive oil. Avocado oil. Grape seed oil. Sweets and desserts: [place] yogurt with honey. Baked apples. Poached pears. Trail mix. Seasoning and other foods: Basil.  Cilantro. Coriander. Cumin. Mint. Parsley. Sage. Rosemary. Tarragon. Garlic. Oregano. Thyme. Pepper. Balsalmic vinegar. Tahini. Hummus. Tomato sauce. Olives. Mushrooms. Limit these Grains: Prepackaged pasta or rice dishes. Prepackaged cereal with added sugar. Vegetables: Deep fried potatoes (french fries). Fruits: Fruit canned in syrup. Meats and other protein foods: Beef. Pork. Lamb. Poultry with skin. Hot dogs. [place] Salines. Dairy: Ice cream. Sour cream. Whole milk. Beverages: Juice. Sugar-sweetened soft drinks. Beer. Liquor and spirits. Fats and oils: Butter. Canola oil. Vegetable oil. Beef fat (tallow). Lard. Sweets and desserts: Cookies. Cakes. Pies. Candy. Seasoning and other foods: Mayonnaise. Premade sauces and marinades. The items listed may not be a complete list. Talk with your dietitian about what dietary choices are right for you. Summary The Mediterranean diet includes both food and lifestyle choices. Eat a variety of fresh fruits and vegetables, beans, nuts, seeds, and whole grains. Limit the amount of red meat and sweets that you eat. Talk with your health care provider about whether it is safe for you to drink red wine in moderation. This means 1 glass a day for nonpregnant women and 2 glasses a day for men. A glass of wine equals 5 oz (150 mL). This information is not intended to replace advice given to you by your health care provider. Make sure  you discuss any questions you have with your health care provider. Document Released: 09/04/2015 Document Revised: 10/07/2015 Document Reviewed: 09/04/2015 Elsevier Interactive Patient Education  2017 [employer].  We have sent a referral to [facility] for your MRI and they will call you directly to schedule your appointment. They are located at [place]. If you need to contact them directly please call [phone].   Your provider has requested that you have labwork completed today. Please go to [facility]  ([phone]) on the second floor of this building before leaving the office today. You do not need to check in. If you are not called within 15 minutes please check with the front desk.

## 2021-04-02 NOTE — Progress Notes (Signed)
Please inform patient of low vitamin B12  at 133 which may be contributing to the memory deficiencies. Recommend starting on vitamin B12 2000 mcg daily. We usually like the B12 levels to be between [phone].  ?Follow-up with the primary care physician. Thank you

## 2021-04-15 ENCOUNTER — Other Ambulatory Visit: Payer: Self-pay

## 2021-04-15 ENCOUNTER — Ambulatory Visit
Admission: RE | Admit: 2021-04-15 | Discharge: 2021-04-15 | Disposition: A | Discharge status: Home / Self Care | Payer: Medicare Other | Source: Ambulatory Visit | Attending: Physician Assistant | Admitting: Physician Assistant

## 2021-04-29 ENCOUNTER — Other Ambulatory Visit: Payer: Self-pay | Admitting: Family Medicine

## 2021-04-29 DIAGNOSIS — E782 Mixed hyperlipidemia: Secondary | ICD-10-CM

## 2021-05-06 ENCOUNTER — Ambulatory Visit (INDEPENDENT_AMBULATORY_CARE_PROVIDER_SITE_OTHER): Payer: Medicare Other | Admitting: Physician Assistant

## 2021-05-06 ENCOUNTER — Encounter: Payer: Self-pay | Admitting: Physician Assistant

## 2021-05-06 VITALS — BP 141/71 | HR 68 | Resp 18 | Ht 61.0 in | Wt 125.0 lb

## 2021-05-06 DIAGNOSIS — R413 Other amnesia: Secondary | ICD-10-CM

## 2021-05-06 NOTE — Progress Notes (Signed)
? ?Assessment/Plan:  ? ? ?Memory difficulties ?MRI of the brain is normal, without any acute findings.  Labs and exam were normal.  It is unclear if these memory difficulties are related to mood changes, or attention issues.  She will discuss this with her primary care physician.  No antidementia medication is indicated. ? ? Recommendations:  ? ?Discussed safety both in and out of the home.  ?Discussed the importance of regular daily schedule with inclusion of crossword puzzles to maintain brain function.  ?Continue to monitor mood by PCP.  Patient to discuss attention issues with Dr.Copland ?Stay active at least 30 minutes at least 3 times a week.  ?Naps should be scheduled and should be no longer than 60 minutes and should not occur after 2 PM.  ?Mediterranean diet is recommended  ?Control cardiovascular risk factors  ?Follow up as needed ? ? ?Case discussed with Dr. Delice Lesch who agrees with the plan ? ? ? ? ?Subjective:  ? ? ?Kristy Arias is a very pleasant 71 y.o. RH female  seen today in follow up for memory loss. This patient is accompanied in the office by her who supplements the history.  Previous records as well as any outside records available were reviewed prior to todays visit.  MRI of the brain was normal, without any acute findings, no evidence of acute intracranial abnormalities.  Last MoCA on 04/02/2021 was 27/30. ?She reports that her memory is about the same.  She is trying to remain more active, exercise routine.  She continues to repeat herself, and she is concerned that perhaps she has some attention deficit.  She wants to discuss this with her primary care physician. Her mood is stable, and she has a history of depression treated with Effexor.  She is sleeping well without REM behavior, sleepwalking, hallucinations or paranoia. She continues to drive, denies getting lost.  She denies being disoriented when walking into her room.  She denies leaving objects in unusual places, but frequently she  forgets the wallet or the phone in the home after leaving the house.  She is independent of bathing and dressing.  Her medications are in a pillbox, and denies missing any.  Her husband is in charge of the finances, as "mouth is harder with age ".  Her appetite is normal, denies trouble swallowing.  She cooks without any further incidents in the kitchen since her mobility.  No recent episodes of severe headaches, takes Imitrex for severe headaches, otherwise controlled with Tylenol.  She denies double vision, dizziness, focal numbness or tingling, unilateral weakness, tremors or anosmia.   ? ? Initial visit 04/02/21 The patient is seen in neurologic consultation at the request of Copland, Gay Filler, MD for the evaluation of memory.  The patient is here alone. ?This is a 71 y.o. year old RH  female who has had memory issues for about 7 years.  She was initially seen here in 05/22/2014, with negative work-up.  She was also seen at [facility] in 2017-05-21, with same concerns, but again work-up was negative, no medications were indicated.  She reports that since her mother died in 05/22/2018, she is concerned that she may develop dementia "I am paranoid about having a diagnosis because of my mom ". She reports that her father died in 10/22/2022 of last year, and that has had an effect on her depression.  Her best friend has been diagnosed with Alzheimer's disease as well at age 54 which brought more concerns.  She states  that at times she repeats herself, she is "very bad with names, and this is worse lately, my brain is in overload, and bouncing all over the place, I am off on another tangent ".  She is concerned about ADHD although she has never been diagnosed with it formally.  I think of tasks to need to do, and do something else instead.  No recent infections, but she did have COVID in October 2022, with "COVID fog for 2 months, I could not remember the names of the books I read but now I am back to normal ". ?She lives with her husband,  who does not note any changes.  Her mood is stable, but she did have moments of depression, with changes in her medications from Cymbalta (which was causing insomnia), now on Effexor and she can sleep well, without REM behavior, sleepwalking, hallucinations or paranoia.  She has been retired since 2017, but remains active, she likes to walk, goes to the gym frequently with her friends.  She continues to drive, denies getting lost.  She denies being disoriented when walking into her room.  She denies leaving objects in unusual places, but frequently she forgets the wallet or the phone in the home after leaving the house.  She is independent of bathing and dressing.  Her medications are in a pillbox, and denies missing any.  Her husband is in charge of the finances, as "mouth is harder with age ".  Her appetite is normal, denies trouble swallowing.  She cooks, has remodeled her kitchen, and learning to use a new opening, accidentally leaving it on.  She has a history of migraine headaches, starting in her teenage years, with a frequency of 1 or 2 a week, 2 times a month, takes Imitrex for severe headaches, otherwise controlled with Tylenol.  She denies double vision, dizziness, focal numbness or tingling, unilateral weakness, tremors or anosmia.  No history of seizures.  Denies urine incontinence, retention, constipation or diarrhea, OSA, alcohol or tobacco abuse.  As mentioned above, mother died with Alzheimer's disease.  She is a retired Software engineer. ?  ?  ? Labs TSH 2.76, D 0.19, lipid panel , CMP, CBC normal, A1C 5.4, B12 low 133  ? ?MRI brain 04/15/21 Unremarkable non-contrast MRI appearance of the brain for age. No evidence of acute intracranial abnormality.Small-volume fluid within the bilateral mastoid air cells. ?  ? ?PREVIOUS MEDICATIONS:  ? ?CURRENT MEDICATIONS:  ?Outpatient Encounter Medications as of 05/06/2021  ?Medication Sig  ? ALPRAZolam (XANAX) 0.5 MG tablet Take 1 tablet (0.5 mg total) by mouth at  bedtime as needed for anxiety. (Patient taking differently: Take 0.5 mg by mouth 3 (three) times daily as needed for anxiety.)  ? amLODipine (NORVASC) 5 MG tablet TAKE 2 TABLETS BY MOUTH EVERY DAY  ? butalbital-acetaminophen-caffeine (FIORICET) 50-325-40 MG tablet Take 1 tablet by mouth 2 (two) times daily as needed.  ? calcium carbonate (TUMS - DOSED IN MG ELEMENTAL CALCIUM) 500 MG chewable tablet Chew 1 tablet by mouth 2 (two) times daily.  ? cetirizine (ZYRTEC) 10 MG tablet Take 10 mg by mouth at bedtime.  ? diclofenac (VOLTAREN) 75 MG EC tablet TAKE 1 TABLET BY MOUTH 2 TIMES DAILY AS NEEDED FOR moderate PAIN  ? estradiol (VIVELLE-DOT) 0.1 MG/24HR patch Place 1 patch onto the skin 2 (two) times a week. Sunday & Thursday  ? ezetimibe (ZETIA) 10 MG tablet Take 1 tablet (10 mg total) by mouth daily.  ? levothyroxine (SYNTHROID) 50 MCG tablet Take  1 tablet (50 mcg total) by mouth daily before breakfast.  ? losartan (COZAAR) 50 MG tablet Take 1 tablet (50 mg total) by mouth daily.  ? Melatonin 10 MG TABS Take by mouth at bedtime.  ? rosuvastatin (CRESTOR) 5 MG tablet TAKE 1 TABLET BY MOUTH 3 TIMES WEEKLY  ? UNABLE TO FIND Med Name: Delta 8  ? venlafaxine XR (EFFEXOR XR) 37.5 MG 24 hr capsule Take 1 capsule (37.5 mg total) by mouth daily with breakfast. Can increase to 75 mg in 2 weeks  ? ?No facility-administered encounter medications on file as of 05/06/2021.  ? ? ? ?Objective:  ?  ? ?PHYSICAL EXAMINATION:   ? ?VITALS:   ?Vitals:  ? 05/06/21 1542  ?BP: (!) 141/71  ?Pulse: 68  ?Resp: 18  ?SpO2: 98%  ?Weight: 125 lb (56.7 kg)  ?Height: 5\' 1"  (1.549 m)  ? ? ?GEN:  The patient appears stated age and is in NAD. ?HEENT:  Normocephalic, atraumatic.  ? ?Neurological examination: ? ?General: NAD, well-groomed, appears stated age. ?Orientation: The patient is alert. Oriented to person, place and date ?Cranial nerves: There is good facial symmetry.The speech is fluent and clear. No aphasia or dysarthria. Fund of knowledge is  appropriate. Recent and remote memory are normal. Attention and concentration are reduced.  Able to name objects and repeat phrases.  Hearing is intact to conversational tone.    ?Sensation: Sensation is [place]

## 2021-05-06 NOTE — Patient Instructions (Addendum)
It was a pleasure to see you today at our office.  ? ?Recommendations: ? ?You have a beautiful brain! ?Follow up with you primary doctor for ADD/ADHD ? ? ? ?RECOMMENDATIONS FOR ALL PATIENTS WITH MEMORY PROBLEMS: ?1. Continue to exercise (Recommend 30 minutes of walking everyday, or 3 hours every week) ?2. Increase social interactions - continue going to [place] and enjoy social gatherings with friends and family ?3. Eat healthy, avoid fried foods and eat more fruits and vegetables ?4. Maintain adequate blood pressure, blood sugar, and blood cholesterol level. Reducing the risk of stroke and cardiovascular disease also helps promoting better memory. ?5. Avoid stressful situations. Live a simple life and avoid aggravations. Organize your time and prepare for the next day in anticipation. ?6. Sleep well, avoid any interruptions of sleep and avoid any distractions in the bedroom that may interfere with adequate sleep quality ?7. Avoid sugar, avoid sweets as there is a strong link between excessive sugar intake, diabetes, and cognitive impairment ?We discussed the Mediterranean diet, which has been shown to help patients reduce the risk of progressive memory disorders and reduces cardiovascular risk. This includes eating fish, eat fruits and green leafy vegetables, nuts like almonds and hazelnuts, walnuts, and also use olive oil. Avoid fast foods and fried foods as much as possible. Avoid sweets and sugar as sugar use has been linked to worsening of memory function. ? ?There is always a concern of gradual progression of memory problems. If this is the case, then we may need to adjust level of care according to patient needs. Support, both to the patient and caregiver, should then be put into place.  ? ? ?FALL PRECAUTIONS: Be cautious when walking. Scan the area for obstacles that may increase the risk of trips and falls. When getting up in the mornings, sit up at the edge of the bed for a few minutes before getting out of  bed. Consider elevating the bed at the head end to avoid drop of blood pressure when getting up. Walk always in a well-lit room (use night lights in the walls). Avoid area rugs or power cords from appliances in the middle of the walkways. Use a walker or a cane if necessary and consider physical therapy for balance exercise. Get your eyesight checked regularly. ? ?FINANCIAL OVERSIGHT: Supervision, especially oversight when making financial decisions or transactions is also recommended. ? ?HOME SAFETY: Consider the safety of the kitchen when operating appliances like stoves, microwave oven, and blender. Consider having supervision and share cooking responsibilities until no longer able to participate in those. Accidents with firearms and other hazards in the house should be identified and addressed as well. ? ? ?ABILITY TO BE LEFT ALONE: If patient is unable to contact 911 operator, consider using Life[employer], or when the need is there, arrange for someone to stay with patients. Smoking is a fire hazard, consider supervision or cessation. Risk of wandering should be assessed by caregiver and if detected at any point, supervision and safe proof recommendations should be instituted. ? ?MEDICATION SUPERVISION: Inability to self-administer medication needs to be constantly addressed. Implement a mechanism to ensure safe administration of the medications. ? ? ?DRIVING: Regarding driving, in patients with progressive memory problems, driving will be impaired. We advise to have someone else do the driving if trouble finding directions or if minor accidents are reported. Independent driving assessment is available to determine safety of driving. ? ? ?If you are interested in the driving assessment, you can contact the following: ? ?[facility]  [employer] in [place] [phone] ? ?[facility] [phone] ? ?[facility] [phone] ? ?[facility] [phone] or [phone] ?  ?

## 2021-05-22 ENCOUNTER — Other Ambulatory Visit: Payer: Self-pay | Admitting: Family Medicine

## 2021-05-22 DIAGNOSIS — E782 Mixed hyperlipidemia: Secondary | ICD-10-CM

## 2021-05-29 LAB — HM COLONOSCOPY

## 2021-06-02 ENCOUNTER — Other Ambulatory Visit: Payer: Self-pay | Admitting: Cardiology

## 2021-06-02 DIAGNOSIS — I10 Essential (primary) hypertension: Secondary | ICD-10-CM

## 2021-06-24 ENCOUNTER — Other Ambulatory Visit: Payer: Self-pay | Admitting: Family Medicine

## 2021-06-24 DIAGNOSIS — F341 Dysthymic disorder: Secondary | ICD-10-CM

## 2021-07-08 ENCOUNTER — Other Ambulatory Visit: Payer: Self-pay | Admitting: Family Medicine

## 2021-07-08 DIAGNOSIS — M159 Polyosteoarthritis, unspecified: Secondary | ICD-10-CM

## 2021-07-08 DIAGNOSIS — E039 Hypothyroidism, unspecified: Secondary | ICD-10-CM

## 2021-07-09 ENCOUNTER — Telehealth: Payer: Self-pay | Admitting: Family Medicine

## 2021-07-09 MED ORDER — ALPRAZOLAM 0.5 MG PO TABS: 0.5000 mg | ORAL_TABLET | Freq: Two times a day (BID) | ORAL | 0 refills | Status: DC | PRN

## 2021-07-09 NOTE — Telephone Encounter (Signed)
Pt stated her gyno dr is retiring and she would like Dr.Copland to take over her xanax rx. A request is being faxed to [place] from the pharmacy.

## 2021-07-09 NOTE — Telephone Encounter (Signed)
Are you okay with this?

## 2021-07-11 ENCOUNTER — Other Ambulatory Visit: Payer: Self-pay

## 2021-07-11 ENCOUNTER — Emergency Department (HOSPITAL_BASED_OUTPATIENT_CLINIC_OR_DEPARTMENT_OTHER): Payer: Medicare Other

## 2021-07-11 ENCOUNTER — Encounter (HOSPITAL_BASED_OUTPATIENT_CLINIC_OR_DEPARTMENT_OTHER): Payer: Self-pay | Admitting: Emergency Medicine

## 2021-07-11 ENCOUNTER — Emergency Department (HOSPITAL_BASED_OUTPATIENT_CLINIC_OR_DEPARTMENT_OTHER)
Admission: EM | Admit: 2021-07-11 | Discharge: 2021-07-11 | Disposition: A | Discharge status: Home / Self Care | Payer: Medicare Other | Attending: Emergency Medicine | Admitting: Emergency Medicine

## 2021-07-11 DIAGNOSIS — E039 Hypothyroidism, unspecified: Secondary | ICD-10-CM | POA: Diagnosis not present

## 2021-07-11 DIAGNOSIS — I4891 Unspecified atrial fibrillation: Secondary | ICD-10-CM | POA: Diagnosis not present

## 2021-07-11 DIAGNOSIS — I480 Paroxysmal atrial fibrillation: Secondary | ICD-10-CM | POA: Insufficient documentation

## 2021-07-11 DIAGNOSIS — I10 Essential (primary) hypertension: Secondary | ICD-10-CM

## 2021-07-11 DIAGNOSIS — Z7901 Long term (current) use of anticoagulants: Secondary | ICD-10-CM | POA: Insufficient documentation

## 2021-07-11 DIAGNOSIS — Z79899 Other long term (current) drug therapy: Secondary | ICD-10-CM | POA: Insufficient documentation

## 2021-07-11 DIAGNOSIS — R42 Dizziness and giddiness: Secondary | ICD-10-CM | POA: Diagnosis present

## 2021-07-11 LAB — URINALYSIS, ROUTINE W REFLEX MICROSCOPIC
Bilirubin Urine: NEGATIVE
Glucose, UA: NEGATIVE mg/dL
Ketones, ur: NEGATIVE mg/dL
Leukocytes,Ua: NEGATIVE
Nitrite: NEGATIVE
Protein, ur: 30 mg/dL — AB
Specific Gravity, Urine: 1.021 (ref 1.005–1.030)
pH: 6 (ref 5.0–8.0)

## 2021-07-11 LAB — BASIC METABOLIC PANEL
Anion gap: 11 (ref 5–15)
BUN: 14 mg/dL (ref 8–23)
CO2: 26 mmol/L (ref 22–32)
Calcium: 10.6 mg/dL — ABNORMAL HIGH (ref 8.9–10.3)
Chloride: 104 mmol/L (ref 98–111)
Creatinine, Ser: 0.91 mg/dL (ref 0.44–1.00)
GFR, Estimated: >60 mL/min (ref >60)
Glucose, Bld: 103 mg/dL — ABNORMAL HIGH (ref 70–99)
Potassium: 3.8 mmol/L (ref 3.5–5.1)
Sodium: 141 mmol/L (ref 135–145)

## 2021-07-11 LAB — CBC
HCT: 46.7 % — ABNORMAL HIGH (ref 36.0–46.0)
Hemoglobin: 15.5 g/dL — ABNORMAL HIGH (ref 12.0–15.0)
MCH: 31.8 pg (ref 26.0–34.0)
MCHC: 33.2 g/dL (ref 30.0–36.0)
MCV: 95.9 fL (ref 80.0–100.0)
Platelets: 315 10*3/uL (ref 150–400)
RBC: 4.87 MIL/uL (ref 3.87–5.11)
RDW: 13.2 % (ref 11.5–15.5)
WBC: 9.7 10*3/uL (ref 4.0–10.5)
nRBC: 0 % (ref 0.0–0.2)

## 2021-07-11 LAB — MAGNESIUM: Magnesium: 1.5 mg/dL — ABNORMAL LOW (ref 1.7–2.4)

## 2021-07-11 MED ORDER — APIXABAN (ELIQUIS) EDUCATION KIT FOR DVT/PE PATIENTS
PACK | Freq: Once | Status: DC
Start: 2021-07-11 — End: 2021-07-11

## 2021-07-11 MED ORDER — DILTIAZEM HCL 25 MG/5ML IV SOLN
INTRAVENOUS | Status: AC
  Administered 2021-07-11: 20 mg
  Filled 2021-07-11: qty 5

## 2021-07-11 MED ORDER — APIXABAN 2.5 MG PO TABS
5.0000 mg | ORAL_TABLET | Freq: Once | ORAL | Status: AC
Start: 2021-07-11 — End: 2021-07-11
  Administered 2021-07-11: 5 mg via ORAL
  Filled 2021-07-11: qty 2

## 2021-07-11 MED ORDER — DILTIAZEM HCL ER COATED BEADS 120 MG PO CP24: 120.0000 mg | ORAL_CAPSULE | Freq: Every day | ORAL | 0 refills | Status: DC

## 2021-07-11 MED ORDER — DILTIAZEM HCL-DEXTROSE 125-5 MG/125ML-% IV SOLN (PREMIX): 5.0000 mg/h | INTRAVENOUS | Status: DC

## 2021-07-11 MED ORDER — APIXABAN 5 MG PO TABS: 5.0000 mg | ORAL_TABLET | Freq: Two times a day (BID) | ORAL | 0 refills | Status: DC

## 2021-07-11 MED ORDER — MAGNESIUM SULFATE 2 GM/50ML IV SOLN
2.0000 g | Freq: Once | INTRAVENOUS | Status: AC
  Administered 2021-07-11: 2 g via INTRAVENOUS
  Filled 2021-07-11: qty 50

## 2021-07-11 MED ORDER — DILTIAZEM LOAD VIA INFUSION
20.0000 mg | Freq: Once | INTRAVENOUS | Status: DC
  Filled 2021-07-11: qty 20

## 2021-07-11 NOTE — ED Provider Notes (Signed)
[facility] EMERGENCY DEPT Provider Note   CSN: [id number] Arrival date & time: 07/11/21  [id number]     History  Chief Complaint  Patient presents with   Dizziness   Palpitations    Kristy Arias is a 71 y.o. female.  Pt is a 71 yo female with a pmhx significant for anxiety, depression, HLD, HTN, hypothyroidism, syndrome X, GERD, and arthritis.  Pt has had periods of dizziness on and off for a year.  She was last seen by Dr. Einar Gip [facility]) in June of 2022.  She said she has had palpitations, but no one has caught what it was until now.  Pt said she developed the palpitations last night around 9 pm.  HR has gone up to 160s-180s intermittently for the last few hrs.       Home Medications Prior to Admission medications   Medication Sig Start Date End Date Taking? Authorizing Provider  ALPRAZolam Duanne Moron) 0.5 MG tablet Take 1 tablet (0.5 mg total) by mouth 2 (two) times daily as needed for anxiety. 07/09/21  Yes Copland, Gay Filler, MD  apixaban (ELIQUIS) 5 MG TABS tablet Take 1 tablet (5 mg total) by mouth 2 (two) times daily. 07/11/21 08/10/21 Yes Isla Pence, MD  butalbital-acetaminophen-caffeine (FIORICET) [phone] MG tablet Take 1 tablet by mouth 2 (two) times daily as needed. 05/09/19  Yes [provider]  calcium carbonate (TUMS - DOSED IN MG ELEMENTAL CALCIUM) 500 MG chewable tablet Chew 1 tablet by mouth 2 (two) times daily.   Yes [provider]  cetirizine (ZYRTEC) 10 MG tablet Take 10 mg by mouth at bedtime.   Yes [provider]  diltiazem (CARDIZEM CD) 120 MG 24 hr capsule Take 1 capsule (120 mg total) by mouth daily. 07/11/21  Yes Isla Pence, MD  estradiol (VIVELLE-DOT) 0.1 MG/24HR patch Place 1 patch onto the skin 2 (two) times a week. Sunday & Thursday   Yes [provider]  ezetimibe (ZETIA) 10 MG tablet TAKE 1 TABLET BY MOUTH EVERY DAY 05/22/21  Yes Copland, Gay Filler, MD  levothyroxine (SYNTHROID) 50 MCG tablet Take  1 tablet by mouth daily before breakfast. 07/08/21  Yes Copland, Gay Filler, MD  losartan (COZAAR) 50 MG tablet TAKE 1 TABLET BY MOUTH ONCE DAILY 06/02/21  Yes Adrian Prows, MD  rosuvastatin (CRESTOR) 5 MG tablet TAKE 1 TABLET BY MOUTH 3 TIMES WEEKLY 04/29/21  Yes Copland, Gay Filler, MD  UNABLE TO FIND Med Name: Delta 8   Yes [provider]  venlafaxine XR (EFFEXOR-XR) 37.5 MG 24 hr capsule Take 2 capsules (75 mg total) by mouth daily with breakfast. 06/24/21  Yes Copland, Gay Filler, MD  Melatonin 10 MG TABS Take by mouth at bedtime.    [provider]      Allergies    Patient has no known allergies.    Review of Systems   Review of Systems  Cardiovascular:  Positive for palpitations.  Neurological:  Positive for dizziness.  All other systems reviewed and are negative.   Physical Exam Updated Vital Signs BP 112/69   Pulse (!) 57   Temp 98.2 F (36.8 C)   Resp 18   Ht 5' 1.5" (1.562 m)   Wt 53.5 kg   SpO2 98%   BMI 21.93 kg/m  Physical Exam Vitals and nursing note reviewed.  Constitutional:      Appearance: Normal appearance.  HENT:     Head: Normocephalic and atraumatic.     Right Ear: External ear  normal.     Left Ear: External ear normal.     Nose: Nose normal.     Mouth/Throat:     Mouth: Mucous membranes are moist.     Pharynx: Oropharynx is clear.  Eyes:     Extraocular Movements: Extraocular movements intact.     Conjunctiva/sclera: Conjunctivae normal.     Pupils: Pupils are equal, round, and reactive to light.  Cardiovascular:     Rate and Rhythm: Tachycardia present. Rhythm irregular.     Pulses: Normal pulses.     Heart sounds: Normal heart sounds.  Pulmonary:     Effort: Pulmonary effort is normal.     Breath sounds: Normal breath sounds.  Abdominal:     General: Abdomen is flat. Bowel sounds are normal.     Palpations: Abdomen is soft.  Musculoskeletal:        General: Normal range of motion.     Cervical back: Normal range of motion  and neck supple.  Skin:    General: Skin is warm.     Capillary Refill: Capillary refill takes less than 2 seconds.  Neurological:     General: No focal deficit present.     Mental Status: She is alert and oriented to person, place, and time.  Psychiatric:        Mood and Affect: Mood normal.        Behavior: Behavior normal.     ED Results / Procedures / Treatments   Labs (all labs ordered are listed, but only abnormal results are displayed) Labs Reviewed  BASIC METABOLIC PANEL - Abnormal; Notable for the following components:      Result Value   Glucose, Bld 103 (*)    Calcium 10.6 (*)    All other components within normal limits  CBC - Abnormal; Notable for the following components:   Hemoglobin 15.5 (*)    HCT 46.7 (*)    All other components within normal limits  URINALYSIS, ROUTINE W REFLEX MICROSCOPIC - Abnormal; Notable for the following components:   Hgb urine dipstick TRACE (*)    Protein, ur 30 (*)    All other components within normal limits  MAGNESIUM - Abnormal; Notable for the following components:   Magnesium 1.5 (*)    All other components within normal limits  CBG MONITORING, ED    EKG EKG Interpretation  Date/Time:  Saturday July 11 2021 10:06:38 EDT Ventricular Rate:  106 PR Interval:    QRS Duration: 66 QT Interval:  338 QTC Calculation: 448 R Axis:   6 Text Interpretation: Atrial fibrillation with rapid ventricular response Septal infarct (cited on or before 29-Dec-2018) Abnormal ECG When compared with ECG of 29-Dec-2018 15:37, Atrial fibrillation has replaced Sinus rhythm T wave inversion now evident in Anterior leads Confirmed by Isla Pence [phone]) on 07/11/2021 11:15:39 AM  Radiology DG Chest Portable 1 View  Result Date: 07/11/2021 CLINICAL DATA:  Cardiac arrhythmia, tachycardia EXAM: PORTABLE CHEST 1 VIEW COMPARISON:  12/29/2018 FINDINGS: The heart size and mediastinal contours are within normal limits. Both lungs are clear. The  visualized skeletal structures are unremarkable. IMPRESSION: No active disease. Electronically Signed   By: Elmer Picker M.D.   On: 07/11/2021 12:52    Procedures Procedures    Medications Ordered in ED Medications  diltiazem (CARDIZEM) 1 mg/mL load via infusion 20 mg (has no administration in time range)  magnesium sulfate IVPB 2 g 50 mL (2 g Intravenous New Bag/Given 07/11/21 1305)  diltiazem (CARDIZEM) 25 MG/5ML injection (  20 mg  Given 07/11/21 1202)  apixaban (ELIQUIS) tablet 5 mg (5 mg Oral Given 07/11/21 1302)    ED Course/ Medical Decision Making/ A&P                           Medical Decision Making Amount and/or Complexity of Data Reviewed Labs: ordered. Radiology: ordered.  Risk Prescription drug management.   This patient presents to the ED for concern of palpitations, this involves an extensive number of treatment options, and is a complaint that carries with it a high risk of complications and morbidity.  The differential diagnosis includes afib, svt, cad   Co morbidities that complicate the patient evaluation  anxiety, depression, HLD, HTN, hypothyroidism, syndrome X, GERD, and arthritis   Additional history obtained:  Additional history obtained from epic chart review External records from outside source obtained and reviewed including husband   Lab Tests:  I Ordered, and personally interpreted labs.  The pertinent results include:  cbc nl, bmp nl, ua nl   Imaging Studies ordered:  I ordered imaging studies including cxr  I independently visualized and interpreted imaging which showed  IMPRESSION:  No active disease.   I agree with the radiologist interpretation   Cardiac Monitoring:  The patient was maintained on a cardiac monitor.  I personally viewed and interpreted the cardiac monitored which showed an underlying rhythm of: afib with rvr   Medicines ordered and prescription drug management:  I ordered medication including diltiazem   for afib with rvr  Reevaluation of the patient after these medicines showed that the patient improved I have reviewed the patients home medicines and have made adjustments as needed   Critical Interventions:  Dilt/eliquis   Consultations Obtained:  I requested consultation with the cardiologist (Dr. Odis Hollingshead),  and discussed lab and imaging findings as well as pertinent plan - he recommended no cardioversion as she may not have felt the afib.  He recommended stopping amlodipine and starting cardizem 120 mg daily.  If her bp stayed elevated, she could take a dose of amlodipine.  He also recommended starting eliquis or xarelto.  His office will call patient for an appointment early next week.   Problem List / ED Course:  Afib with rvr:  rate is controlled.  She is stable.  We will follow Dr. Emelda Brothers recommendations and d/c with close f/u. Hypomagnesemia:  pt given 2 g Mg in ED Hx hypothyroidism:  TSH drawn, but pending.  Results won't come back for several hrs.  This can be followed when she sees cards.   Reevaluation:  After the interventions noted above, I reevaluated the patient and found that they have :improved   Social Determinants of Health:  Lives at home with husband   Dispostion:  After consideration of the diagnostic results and the patients response to treatment, I feel that the patent would benefit from discharge with outpatient f/u.          Final Clinical Impression(s) / ED Diagnoses Final diagnoses:  Atrial fibrillation with rapid ventricular response (HCC)    Rx / DC Orders ED Discharge Orders          Ordered    apixaban (ELIQUIS) 5 MG TABS tablet  2 times daily        07/11/21 1238    diltiazem (CARDIZEM CD) 120 MG 24 hr capsule  Daily        07/11/21 1238  Jacalyn Lefevre, MD 07/11/21 1331

## 2021-07-11 NOTE — ED Triage Notes (Signed)
Pt via pov from home with dizziness and racing heart, which pt states happens about every 6 weeks. She checked hr with Lourena Simmonds, which told her she was in afib. She had cardiac workup 2 years ago, but all was normal. She reports that episodes resolves after 4-6 hours. Pt alert & oriented, nad noted.

## 2021-07-11 NOTE — Progress Notes (Signed)
ON-CALL CARDIOLOGY 07/11/21  Patient's name: Kristy Arias.   MRN: [id number].    DOB: April 28, 1950 Primary care provider: Pearline Cables, MD. Primary cardiologist: Dr. Yates Decamp  Interaction regarding this patient's care today: ED provider Dr.Haviland called to discuss her case.  Patient presents to the hospital with a chief complaint of palpitations and dizziness.   EKG confirms atrial fibrillation.   Hemodynamically patient is stable. Her ventricular rate based on the vital signs are within acceptable limits. She is not having any anginal discomfort or heart failure symptoms.  ED provider wanted recommendations with management of atrial fibrillation.  Data reviewed: Temp:  [98.2 F (36.8 C)-98.4 F (36.9 C)] 98.4 F (36.9 C) (06/17 1330) Pulse Rate:  [32-112] 78 (06/17 1330) Resp:  [12-18] 17 (06/17 1330) BP: (99-135)/(64-101) 99/64 (06/17 1330) SpO2:  [97 %-99 %] 97 % (06/17 1330) Weight:  [53.5 kg] 53.5 kg (06/17 1230)        Latest Ref Rng & Units 07/11/2021   10:10 AM 03/25/2021   11:16 AM 03/20/2020   10:32 AM  CBC  WBC 4.0 - 10.5 K/uL 9.7  5.3  6.0   Hemoglobin 12.0 - 15.0 g/dL 10.6  26.9  48.5   Hematocrit 36.0 - 46.0 % 46.7  40.6  41.3   Platelets 150 - 400 K/uL 315  279.0  304.0        Latest Ref Rng & Units 07/11/2021   10:10 AM 03/25/2021   11:16 AM 03/20/2020   10:32 AM  CMP  Glucose 70 - 99 mg/dL 462  88  88   BUN 8 - 23 mg/dL 14  14  12    Creatinine 0.44 - 1.00 mg/dL  7.03  5.00   Sodium 135 - 145 mmol/L 141  136  138   Potassium 3.5 - 5.1 mmol/L 3.8  3.9  4.3   Chloride 98 - 111 mmol/L 104  102  105   CO2 22 - 32 mmol/L 26  27  27    Calcium 8.9 - 10.3 mg/dL 9.38  9.1  9.1   Total Protein 6.0 - 8.3 g/dL  6.5  6.5   Total Bilirubin 0.2 - 1.2 mg/dL  0.6  0.6   Alkaline Phos 39 - 117 U/L  53  57   AST 0 - 37 U/L  18  17   ALT 0 - 35 U/L  16  15     Lipid Panel     Component Value Date/Time   CHOL 180 03/25/2021 1116   TRIG 109.0  03/25/2021 1116   HDL 71.20 03/25/2021 1116   CHOLHDL 3 03/25/2021 1116   VLDL 21.8 03/25/2021 1116   LDLCALC 87 03/25/2021 1116    No components found for: "NTPROBNP" No results for input(s): "PROBNP" in the last 8760 hours. Recent Labs    03/25/21 1116  TSH 2.92    BMP Recent Labs    03/25/21 1116 07/11/21 1010  NA 136 141  K 3.9 3.8  CL 102 104  CO2 27 26  GLUCOSE 88 103*  BUN 14 14  CREATININE 0.86 0.91  CALCIUM 9.1 10.6*  GFRNONAA  --  >60    HEMOGLOBIN A1C Lab Results  Component Value Date   HGBA1C 5.4 03/25/2021   MPG 100 08/04/2016   Estimated Creatinine Clearance: 45.8 mL/min (by C-G formula based on SCr of 0.91 mg/dL).  Hepatic Function Panel Recent Labs    03/25/21 1116  PROT 6.5  ALBUMIN 4.0  AST  18  ALT 16  ALKPHOS 53  BILITOT 0.6   PCV ECHOCARDIOGRAM COMPLETE 01/11/2019  Narrative Echocardiogram 01/11/2019: Left ventricle cavity is normal in size. Mild concentric hypertrophy of the left ventricle. Normal LV systolic function with visual EF 50-55%. Normal global wall motion. Normal diastolic filling pattern. Trileaflet aortic valve.  Trace aortic regurgitation. Mild prolapse of the mitral valve leaflets with mild (Grade I) eccentric mitral regurgitation. Mild tricuspid regurgitation. Mild pulmonic regurgitation. No evidence of pulmonary hypertension. Compared to previous study in 2014, valvular regurgitation findings are new.   PCV MYOCARDIAL PERFUSION WITH LEXISCAN 01/24/2019  Narrative Lexiscan Tetrofosmin stress test 01/24/2019: No previous exam available for comparison. Lexiscan/walking nuclear stress test performed using 1-day protocol. Stress EKG is non-diagnostic, as this is pharmacological stress test. Stress EKG showed sinus tachycardia at 99% MPHR, no ischemic changes. Myocardial perfusion imaging is normal. Stress LVEF 20%. High risk study due to reduced LVEF. Recommend clinical correlation.  CHA2DS2-VASc SCORE is 3 which  correlates to 3.2 % risk of stroke per year (hypertension, age, gender).   Impression: New onset of atrial fibrillation  Recommendations: Advised against direct-current cardioversion as the duration of atrial fibrillation is unknown.  Since her ventricular rate is well controlled, hemodynamically stable, asymptomatic recommend either outpatient follow-up in the next week versus admission with cardiology consult.  Shared decision was to discharge her.  Prior echo and stress test results reviewed.  Discontinue amlodipine  Start Cardizem 120 mg p.o. daily  Start oral anticoagulation if patient agrees after discussing the risks, benefits, alternatives.  Would recommend either Xarelto or Eliquis as per patient's preference and insurance coverage.  We will arrange outpatient follow-up with either Dr. Jacinto Halim or Elvin So, PA in the coming week.  Recommendations conveyed to ED provider who is in agreement with the plan of care as well.  Telephone encounter total time: 17 minutes.   Tessa Lerner, [place], [facility]  Pager: [phone] Office: [phone]

## 2021-07-11 NOTE — Discharge Instructions (Addendum)
If  you blood pressure stays elevated while off Amlodipine and taking Cardizem, it is ok to take 5 mg of the Amlodipine once a day.

## 2021-07-14 NOTE — Progress Notes (Unsigned)
Primary Physician/Referring:  Pearline Cables, MD  Patient ID: [name], female    DOB: August 15, 1950, 71 y.o.   MRN: [id number]  No chief complaint on file.  HPI:    NOEHMI COTT  is a 71 y.o. Caucasian female  with hypertension, hypothyroidism, and hyperlipidemia, abnormal treadmill stress test in 2014 and coronary angiography normal at that time, due to recurrent chest pain she had undergone nuclear test test in December 2020 which was normal. Felt to likely have endothelial dysfunction.   Patient was last seen in the office 07/23/2020 at which time she was stable from a cardiovascular standpoint advised to follow-up annually.  However she presented to [facility] emergency department 07/11/2021 with complaints of dizziness and palpitations.  EKG at that time revealed new onset atrial fibrillation with rapid ventricular response.  At discharge patient was switched from amlodipine to diltiazem 120 mg daily and started on Eliquis.  Patient now presents for follow-up. ***  ***Needs new a fib work up.   This is her annual visit, she is presently doing well and has not had any further episodes of chest pain.  She has noticed occasional episodes of dizziness and at that time she has recorded blood pressure to be low and associated dyspnea only during these episodes.  Otherwise she has remained fairly active.    Past Medical History:  Diagnosis Date   Anemia    Anxiety    Arthritis    Depression    GERD (gastroesophageal reflux disease)    Glaucoma    Headache    Heart murmur    Hyperlipidemia    Hypertension    Hypothyroidism    Shortness of breath    syndrome x   Syndrome X, cardiac [facility])    Past Surgical History:  Procedure Laterality Date   ABDOMINAL HYSTERECTOMY     BREAST ENHANCEMENT SURGERY     BREAST SURGERY     CARDIAC CATHETERIZATION  4/14   CHOLECYSTECTOMY     COSMETIC SURGERY     DILATION AND CURETTAGE OF UTERUS  2013   EYE SURGERY     LAPAROSCOPIC  CHOLECYSTECTOMY SINGLE PORT N/A 06/29/2012   Procedure: LAPAROSCOPIC CHOLECYSTECTOMY SINGLE PORT IOC ;  Surgeon: Ardeth Sportsman, MD;  Location: [facility] ORS;  Service: General;  Laterality: N/A;   LEFT HEART CATHETERIZATION WITH CORONARY ANGIOGRAM N/A 05/23/2012   Procedure: LEFT HEART CATHETERIZATION WITH CORONARY ANGIOGRAM;  Surgeon: [name], MD;  Location: [facility] CATH LAB;  Service: Cardiovascular;  Laterality: N/A;   TOTAL KNEE ARTHROPLASTY Right 08/25/2015   Procedure: RIGHT TOTAL KNEE ARTHROPLASTY;  Surgeon: Ollen Gross, MD;  Location: [facility] ORS;  Service: Orthopedics;  Laterality: Right;   TOTAL KNEE ARTHROPLASTY Left 08/09/2016   Procedure: LEFT TOTAL KNEE ARTHROPLASTY;  Surgeon: Ollen Gross, MD;  Location: [facility] ORS;  Service: Orthopedics;  Laterality: Left;   Family History  Problem Relation Age of Onset   Hypertension Father    Diabetes Father    Heart disease Father    Hyperlipidemia Father    Hypertension Maternal Grandmother    Heart disease Maternal Grandmother    Diabetes Maternal Grandfather    Hyperlipidemia Maternal Grandfather    Heart disease Maternal Grandfather    Osteoporosis Paternal Grandmother    Heart disease Paternal Grandmother    Heart disease Paternal Grandfather    Cancer Mother    Hyperlipidemia Mother    Hypertension Mother    Cancer Sister     Social History  Tobacco Use   Smoking status: Never   Smokeless tobacco: Never  Substance Use Topics   Alcohol use: No   Marital Status: Married  ROS  Review of Systems  Cardiovascular:  Positive for dyspnea on exertion. Negative for chest pain and leg swelling.  Gastrointestinal:  Negative for melena.  Neurological:  Positive for dizziness.   Objective  There were no vitals taken for this visit.     07/11/2021    1:30 PM 07/11/2021   12:30 PM 07/11/2021   11:45 AM  Vitals with BMI  Height  5' 1.5"   Weight  118 lbs   BMI  21.94   Systolic 99 112 135  Diastolic 64 69 77  Pulse 78 57 74      Physical Exam Neck:     Vascular: Carotid bruit (Bilateral) present. No JVD.  Cardiovascular:     Rate and Rhythm: Normal rate and regular rhythm.     Pulses: Normal pulses and intact distal pulses.     Heart sounds: Murmur heard.     Early systolic murmur is present with a grade of 2/6 at the lower right sternal border.     No gallop.     Comments: No leg edema, no JVD. Pulmonary:     Effort: Pulmonary effort is normal.     Breath sounds: Normal breath sounds.  Abdominal:     General: Bowel sounds are normal.     Palpations: Abdomen is soft.    Laboratory examination:   estimated creatinine clearance is 45.8 mL/min (by C-G formula based on SCr of 0.91 mg/dL).     Latest Ref Rng & Units 07/11/2021   10:10 AM 03/25/2021   11:16 AM 03/20/2020   10:32 AM  CMP  Glucose 70 - 99 mg/dL 008  88  88   BUN 8 - 23 mg/dL 14  14  12    Creatinine 0.44 - 1.00 mg/dL  6.76  1.95   Sodium 135 - 145 mmol/L 141  136  138   Potassium 3.5 - 5.1 mmol/L 3.8  3.9  4.3   Chloride 98 - 111 mmol/L 104  102  105   CO2 22 - 32 mmol/L 26  27  27    Calcium 8.9 - 10.3 mg/dL 0.93  9.1  9.1   Total Protein 6.0 - 8.3 g/dL  6.5  6.5   Total Bilirubin 0.2 - 1.2 mg/dL  0.6  0.6   Alkaline Phos 39 - 117 U/L  53  57   AST 0 - 37 U/L  18  17   ALT 0 - 35 U/L  16  15       Latest Ref Rng & Units 07/11/2021   10:10 AM 03/25/2021   11:16 AM 03/20/2020   10:32 AM  CBC  WBC 4.0 - 10.5 K/uL 9.7  5.3  6.0   Hemoglobin 12.0 - 15.0 g/dL 05/25/2021  03/22/2020  12.4   Hematocrit 36.0 - 46.0 % 46.7  40.6  41.3   Platelets 150 - 400 K/uL 315  279.0  304.0    Lipid Panel     Component Value Date/Time   CHOL 180 03/25/2021 1116   TRIG 109.0 03/25/2021 1116   HDL 71.20 03/25/2021 1116   CHOLHDL 3 03/25/2021 1116   VLDL 21.8 03/25/2021 1116   LDLCALC 87 03/25/2021 1116   HEMOGLOBIN A1C Lab Results  Component Value Date   HGBA1C 5.4 03/25/2021   MPG 100 08/04/2016   TSH  Recent Labs    03/25/21 1116  TSH 2.92     Allergies  No Known Allergies   Medications Prior to Visit:   Outpatient Medications Prior to Visit  Medication Sig Dispense Refill   ALPRAZolam (XANAX) 0.5 MG tablet Take 1 tablet (0.5 mg total) by mouth 2 (two) times daily as needed for anxiety. 60 tablet 0   apixaban (ELIQUIS) 5 MG TABS tablet Take 1 tablet (5 mg total) by mouth 2 (two) times daily. 60 tablet 0   butalbital-acetaminophen-caffeine (FIORICET) 50-325-40 MG tablet Take 1 tablet by mouth 2 (two) times daily as needed.     calcium carbonate (TUMS - DOSED IN MG ELEMENTAL CALCIUM) 500 MG chewable tablet Chew 1 tablet by mouth 2 (two) times daily.     cetirizine (ZYRTEC) 10 MG tablet Take 10 mg by mouth at bedtime.     diltiazem (CARDIZEM CD) 120 MG 24 hr capsule Take 1 capsule (120 mg total) by mouth daily. 30 capsule 0   estradiol (VIVELLE-DOT) 0.1 MG/24HR patch Place 1 patch onto the skin 2 (two) times a week. Sunday & Thursday     ezetimibe (ZETIA) 10 MG tablet TAKE 1 TABLET BY MOUTH EVERY DAY 90 tablet 3   levothyroxine (SYNTHROID) 50 MCG tablet Take 1 tablet by mouth daily before breakfast. 90 tablet 0   losartan (COZAAR) 50 MG tablet TAKE 1 TABLET BY MOUTH ONCE DAILY 90 tablet 3   Melatonin 10 MG TABS Take by mouth at bedtime.     rosuvastatin (CRESTOR) 5 MG tablet TAKE 1 TABLET BY MOUTH 3 TIMES WEEKLY 90 tablet 3   UNABLE TO FIND Med Name: Delta 8     venlafaxine XR (EFFEXOR-XR) 37.5 MG 24 hr capsule Take 2 capsules (75 mg total) by mouth daily with breakfast. 60 capsule 2   No facility-administered medications prior to visit.   Final Medications at End of Visit    No outpatient medications have been marked as taking for the 07/15/21 encounter (Appointment) with Rayetta Pigg, Deion Forgue C, PA-C.   Radiology:   No results found.  Cardiac Studies:   Echocardiogram 01/11/2019: Left ventricle cavity is normal in size. Mild concentric hypertrophy of the left ventricle. Normal LV systolic function with visual EF 50-55%.  Normal global wall motion. Normal diastolic filling pattern.  Trileaflet aortic valve.  Trace aortic regurgitation. Mild prolapse of the mitral valve leaflets with mild (Grade I) eccentric mitral regurgitation. Mild tricuspid regurgitation.  Mild pulmonic regurgitation. No evidence of pulmonary hypertension. Compared to previous study in 2014, valvular regurgitation findings are new.  Lexiscan Tetrofosmin stress test 01/24/2019: No previous exam available for comparison. Lexiscan/walking nuclear stress test performed using 1-day protocol. Stress EKG is non-diagnostic, as this is pharmacological stress test. Stress EKG showed sinus tachycardia at 99% MPHR, no ischemic changes.  Myocardial perfusion imaging is normal. Stress LVEF 20%.  High risk study due to reduced LVEF. Recommend clinical correlation.  Exercise testing 07/12/2019: with gas exchange demonstrates normal functional capacity when compared to matched sedentary norms. There is no cardiopulmonary abnormality. Patient has an excessive HR response to exercise and given all other normal parameters, this could be improved with cardiovascular exercise training, however should be clinically followed.    Coronary Calcium Score 06/14/2019: Total Agatston Score: 0  MESA database percentile: 0  AORTA MEASUREMENTS:  Ascending Aorta: 32 mm  Descending Aorta: 22 mm  OTHER FINDINGS: Heart is normal size. No adenopathy. Scarring in the right middle lobe. No confluent opacities or effusions. Imaging into  the upper abdomen shows no acute findings. Bilateral breast implants noted. Heart and mediastinal contours are within normal limits. No focal opacities or effusions. No acute bony abnormality.   IMPRESSION: No visible coronary artery calcifications. Total coronary calcium score of 0. No acute or significant extracardiac abnormality.  EKG  ***  07/11/2021: Atrial fibrillation at a rate of 106 bpm.    EKG 07/22/2020: Normal sinus rhythm at  rate of 66 bpm.  Poor R progression, probably normal variant.  No evidence of ischemia.   EKG 05/23/2019: Sinus bradycardia at rate of 58 bpm with borderline first-degree AV block, left atrial abnormality, poor R wave progression, cannot exclude anteroseptal infarct old. T wave inversion in V1 to V3, cannot exclude anteroseptal ischemia.  No significant change from 03/06/2018.  Assessment   No diagnosis found.   No orders of the defined types were placed in this encounter.   There are no discontinued medications.   CHA2DS2-VASc SCORE is 3 which correlates to 3.2 % risk of stroke per year (hypertension, age, gender).  Recommendations:   LETETIA ROMANELLO  is a 71 y.o. Caucasian female  with hypertension, hypothyroidism, and probably familial hyperlipidemia (07/14/2016: Total cholesterol 04/02/2012 and LDL 222), abnormal treadmill stress test in 2014 and coronary angiography normal at that time, due to recurrent chest pain she had undergone nuclear test test in December 2020 which was normal. Felt to likely have endothelial dysfunction.  Patient was last seen in the office 07/23/2020 at which time she was stable from a cardiovascular standpoint advised to follow-up annually.  However she presented to [facility] emergency department 07/11/2021 with complaints of dizziness and palpitations.  EKG at that time revealed new onset atrial fibrillation with rapid ventricular response.  At discharge patient was switched from amlodipine to diltiazem 120 mg daily and started on Eliquis.  Patient now presents for follow-up. ***  ***Needs new a fib work up.   This is her annual visit, she is presently doing well and has not had any further episodes of chest pain.  She has noticed occasional episodes of dizziness and at that time she has recorded blood pressure to be low and associated dyspnea only during these episodes.  Otherwise she has remained fairly active.  She has reduced the dose of losartan to 25 mg daily,  advised her to continue present dose and continue monitoring her blood pressure.  She could certainly discontinue losartan or better yet amlodipine if blood pressure continues to remain low and she has dizzy episodes.  Lipids are under excellent control, she is a super responder to statins and Zetia with marked improvement in lipid profile.  Continue the same.  She has new bilateral carotid artery bruit, will obtain carotid duplex.  Otherwise stable from cardiac standpoint I will see her back in a year.  She remained stable I will see her back on a as needed basis.

## 2021-07-15 ENCOUNTER — Ambulatory Visit: Payer: Medicare Other | Admitting: Student

## 2021-07-15 ENCOUNTER — Encounter: Payer: Self-pay | Admitting: Student

## 2021-07-15 VITALS — BP 129/74 | HR 71 | Temp 98.0°F | Resp 17 | Ht 61.5 in | Wt 118.2 lb

## 2021-07-15 DIAGNOSIS — I4891 Unspecified atrial fibrillation: Secondary | ICD-10-CM

## 2021-07-15 DIAGNOSIS — R9431 Abnormal electrocardiogram [ECG] [EKG]: Secondary | ICD-10-CM

## 2021-07-24 ENCOUNTER — Telehealth: Payer: Self-pay

## 2021-07-24 MED ORDER — DILTIAZEM HCL ER COATED BEADS 240 MG PO CP24
240.0000 mg | ORAL_CAPSULE | Freq: Every day | ORAL | 3 refills | Status: DC
Start: 2021-07-24 — End: 2021-11-27

## 2021-07-24 NOTE — Telephone Encounter (Signed)
Pt called and stated that she has been in AFIB since Tuesday 07/21/2021. She is not as SOB now as she was on Tuesday. She has been recording her HR on an app called Cardia. She wants to know if there is anything she can do. Please advise.

## 2021-07-24 NOTE — Telephone Encounter (Signed)
She has been monitoring herself with cardia mobile app.  She has been in atrial fibrillation according to cardia mobile for the last 2 days.  Maximum heart rate noted 142 bpm, presently 94 bpm.  Blood pressure is also been elevated at approximately 159/90 mmHg.  She reports mild dyspnea on exertion and minimal dizziness.  We will increase diltiazem from 120 mg to 240 mg p.o. daily.  Patient will continue to monitor and notify our office in 3 days if she remains in atrial fibrillation, would then set her up for office visit.

## 2021-08-07 ENCOUNTER — Other Ambulatory Visit: Payer: Self-pay | Admitting: Student

## 2021-08-08 ENCOUNTER — Encounter: Payer: Self-pay | Admitting: Physician Assistant

## 2021-08-10 ENCOUNTER — Other Ambulatory Visit: Payer: Self-pay

## 2021-08-10 ENCOUNTER — Telehealth: Payer: Self-pay

## 2021-08-10 MED ORDER — APIXABAN 5 MG PO TABS: 5.0000 mg | ORAL_TABLET | Freq: Two times a day (BID) | ORAL | 0 refills | Status: DC

## 2021-08-10 NOTE — Telephone Encounter (Signed)
Received a fax from [employer] [facility] for Eliquis. Pt sees Cardiology. Please advise.

## 2021-08-10 NOTE — Telephone Encounter (Signed)
Yes we can refill 

## 2021-08-10 NOTE — Telephone Encounter (Signed)
Refill has been sent.  °

## 2021-08-10 NOTE — Telephone Encounter (Signed)
Pt requesting eliquis refill. She is almost out, I do not see that this medication was prescribed by [place]. Okay to refill? Please advise.

## 2021-08-12 ENCOUNTER — Ambulatory Visit: Payer: Medicare Other

## 2021-08-12 DIAGNOSIS — I4891 Unspecified atrial fibrillation: Secondary | ICD-10-CM

## 2021-08-12 DIAGNOSIS — R9431 Abnormal electrocardiogram [ECG] [EKG]: Secondary | ICD-10-CM

## 2021-08-17 ENCOUNTER — Encounter (HOSPITAL_BASED_OUTPATIENT_CLINIC_OR_DEPARTMENT_OTHER): Payer: Self-pay | Admitting: Emergency Medicine

## 2021-08-17 ENCOUNTER — Emergency Department (HOSPITAL_BASED_OUTPATIENT_CLINIC_OR_DEPARTMENT_OTHER)
Admission: EM | Admit: 2021-08-17 | Discharge: 2021-08-17 | Disposition: A | Discharge status: Home / Self Care | Payer: Medicare Other | Attending: Emergency Medicine | Admitting: Emergency Medicine

## 2021-08-17 ENCOUNTER — Other Ambulatory Visit: Payer: Self-pay

## 2021-08-17 ENCOUNTER — Ambulatory Visit: Payer: Medicare Other

## 2021-08-17 ENCOUNTER — Emergency Department (HOSPITAL_BASED_OUTPATIENT_CLINIC_OR_DEPARTMENT_OTHER): Payer: Medicare Other | Admitting: Radiology

## 2021-08-17 DIAGNOSIS — I4891 Unspecified atrial fibrillation: Secondary | ICD-10-CM | POA: Diagnosis not present

## 2021-08-17 DIAGNOSIS — D72829 Elevated white blood cell count, unspecified: Secondary | ICD-10-CM | POA: Diagnosis not present

## 2021-08-17 DIAGNOSIS — Z7901 Long term (current) use of anticoagulants: Secondary | ICD-10-CM | POA: Insufficient documentation

## 2021-08-17 DIAGNOSIS — R9431 Abnormal electrocardiogram [ECG] [EKG]: Secondary | ICD-10-CM

## 2021-08-17 DIAGNOSIS — R079 Chest pain, unspecified: Secondary | ICD-10-CM

## 2021-08-17 DIAGNOSIS — R197 Diarrhea, unspecified: Secondary | ICD-10-CM | POA: Insufficient documentation

## 2021-08-17 LAB — CBC
HCT: 42.9 % (ref 36.0–46.0)
Hemoglobin: 14.3 g/dL (ref 12.0–15.0)
MCH: 32 pg (ref 26.0–34.0)
MCHC: 33.3 g/dL (ref 30.0–36.0)
MCV: 96 fL (ref 80.0–100.0)
Platelets: 292 10*3/uL (ref 150–400)
RBC: 4.47 MIL/uL (ref 3.87–5.11)
RDW: 13.4 % (ref 11.5–15.5)
WBC: 11.3 10*3/uL — ABNORMAL HIGH (ref 4.0–10.5)
nRBC: 0 % (ref 0.0–0.2)

## 2021-08-17 LAB — BASIC METABOLIC PANEL
Anion gap: 13 (ref 5–15)
BUN: 12 mg/dL (ref 8–23)
CO2: 21 mmol/L — ABNORMAL LOW (ref 22–32)
Calcium: 9.7 mg/dL (ref 8.9–10.3)
Chloride: 103 mmol/L (ref 98–111)
Creatinine, Ser: 0.8 mg/dL (ref 0.44–1.00)
GFR, Estimated: >60 mL/min (ref >60)
Glucose, Bld: 114 mg/dL — ABNORMAL HIGH (ref 70–99)
Potassium: 4.3 mmol/L (ref 3.5–5.1)
Sodium: 137 mmol/L (ref 135–145)

## 2021-08-17 LAB — TROPONIN I (HIGH SENSITIVITY)
Troponin I (High Sensitivity): 4 ng/L (ref <18)
Troponin I (High Sensitivity): 5 ng/L (ref <18)

## 2021-08-17 MED ORDER — ASPIRIN 81 MG PO CHEW
324.0000 mg | CHEWABLE_TABLET | Freq: Once | ORAL | Status: AC
  Administered 2021-08-17: 324 mg via ORAL
  Filled 2021-08-17: qty 4

## 2021-08-17 NOTE — ED Triage Notes (Signed)
Pt arrives to the ED with c/o chest pain, diarrhea, and heartburn. Pt reports she had diarrhea that started last night and chest pain that began this morning. She reports the CP is epigastric and described as pressure. She has taken TUMS w/o relief. Associated symptoms include SOB and lower back pain. No N/V.

## 2021-08-17 NOTE — ED Provider Notes (Signed)
MEDCENTER [facility] EMERGENCY DEPT Provider Note   CSN: [id number] Arrival date & time: 08/17/21  2993     History {Add pertinent medical, surgical, social history, OB history to HPI:1} Chief Complaint  Patient presents with   Chest Pain   Diarrhea    Kristy Arias is a 71 y.o. female.  Patient with history of atrial fibrillation compliant with anticoagulant, high blood pressure history presents with chest pressure since 4:00 this morning.  Symptoms of gradual improved and currently minimal.  No history of similar.  This is different than reflux history.  No abdominal pain or vomiting.  Patient had a stress test planned today with [facility] cardiology.  Patient tried Tums without relief.  No fevers or cough.  Mild shortness of breath is resolved.  Patient denies blood clot risk factors.       Home Medications Prior to Admission medications   Medication Sig Start Date End Date Taking? Authorizing Provider  ALPRAZolam Prudy Feeler) 0.5 MG tablet Take 1 tablet (0.5 mg total) by mouth 2 (two) times daily as needed for anxiety. 07/09/21   Copland, Gwenlyn Found, MD  apixaban (ELIQUIS) 5 MG TABS tablet Take 1 tablet (5 mg total) by mouth 2 (two) times daily. 08/10/21 09/09/21  Cantwell, Celeste C, PA-C  butalbital-acetaminophen-caffeine (FIORICET) 50-325-40 MG tablet Take 1 tablet by mouth 2 (two) times daily as needed. 05/09/19   [provider]  calcium carbonate (TUMS - DOSED IN MG ELEMENTAL CALCIUM) 500 MG chewable tablet Chew 1 tablet by mouth 2 (two) times daily.    [provider]  cetirizine (ZYRTEC) 10 MG tablet Take 10 mg by mouth at bedtime.    [provider]  diltiazem (CARDIZEM CD) 240 MG 24 hr capsule Take 1 capsule (240 mg total) by mouth daily. 07/24/21   Cantwell, Celeste C, PA-C  estradiol (VIVELLE-DOT) 0.1 MG/24HR patch Place 1 patch onto the skin 2 (two) times a week. Sunday & Thursday    [provider]  ezetimibe (ZETIA) 10 MG tablet TAKE  1 TABLET BY MOUTH EVERY DAY 05/22/21   Copland, Gwenlyn Found, MD  levothyroxine (SYNTHROID) 50 MCG tablet Take 1 tablet by mouth daily before breakfast. 07/08/21   Copland, Gwenlyn Found, MD  losartan (COZAAR) 50 MG tablet TAKE 1 TABLET BY MOUTH ONCE DAILY 06/02/21   Yates Decamp, MD  Melatonin 10 MG TABS Take by mouth at bedtime.    [provider]  rosuvastatin (CRESTOR) 5 MG tablet TAKE 1 TABLET BY MOUTH 3 TIMES WEEKLY 04/29/21   Copland, Gwenlyn Found, MD  UNABLE TO FIND Med Name: Delta 8    [provider]  venlafaxine XR (EFFEXOR-XR) 37.5 MG 24 hr capsule Take 2 capsules (75 mg total) by mouth daily with breakfast. 06/24/21   Copland, Gwenlyn Found, MD      Allergies    Patient has no known allergies.    Review of Systems   Review of Systems  Constitutional:  Negative for chills and fever.  HENT:  Negative for congestion.   Eyes:  Negative for visual disturbance.  Respiratory:  Positive for shortness of breath.   Cardiovascular:  Positive for chest pain. Negative for leg swelling.  Gastrointestinal:  Negative for abdominal pain and vomiting.  Genitourinary:  Negative for dysuria and flank pain.  Musculoskeletal:  Negative for back pain, neck pain and neck stiffness.  Skin:  Negative for rash.  Neurological:  Negative for light-headedness and headaches.    Physical Exam Updated Vital Signs BP Marland Kitchen)  169/78   Pulse (!) 56   Temp 97.7 F (36.5 C) (Oral)   Resp 11   Ht 5' 1.5" (1.562 m)   Wt 54.4 kg   SpO2 98%   BMI 22.31 kg/m  Physical Exam Vitals and nursing note reviewed.  Constitutional:      General: She is not in acute distress.    Appearance: She is well-developed.  HENT:     Head: Normocephalic and atraumatic.     Mouth/Throat:     Mouth: Mucous membranes are moist.  Eyes:     General:        Right eye: No discharge.        Left eye: No discharge.     Conjunctiva/sclera: Conjunctivae normal.  Neck:     Trachea: No tracheal deviation.  Cardiovascular:     Rate  and Rhythm: Normal rate and regular rhythm.     Heart sounds: No murmur heard. Pulmonary:     Effort: Pulmonary effort is normal.     Breath sounds: Normal breath sounds.  Abdominal:     General: There is no distension.     Palpations: Abdomen is soft.     Tenderness: There is no abdominal tenderness. There is no guarding.  Musculoskeletal:     Cervical back: Normal range of motion and neck supple. No rigidity.     Right lower leg: No tenderness. No edema.     Left lower leg: No tenderness. No edema.  Skin:    General: Skin is warm.     Capillary Refill: Capillary refill takes less than 2 seconds.     Findings: No rash.  Neurological:     General: No focal deficit present.     Mental Status: She is alert.     Cranial Nerves: No cranial nerve deficit.  Psychiatric:        Mood and Affect: Mood normal.     ED Results / Procedures / Treatments   Labs (all labs ordered are listed, but only abnormal results are displayed) Labs Reviewed  BASIC METABOLIC PANEL - Abnormal; Notable for the following components:      Result Value   CO2 21 (*)    Glucose, Bld 114 (*)    All other components within normal limits  CBC - Abnormal; Notable for the following components:   WBC 11.3 (*)    All other components within normal limits  TROPONIN I (HIGH SENSITIVITY)  TROPONIN I (HIGH SENSITIVITY)    EKG EKG Interpretation  Date/Time:  Monday August 17 2021 08:40:09 EDT Ventricular Rate:  58 PR Interval:  187 QRS Duration: 81 QT Interval:  441 QTC Calculation: 434 R Axis:   3 Text Interpretation: Sinus rhythm Probable left atrial enlargement Borderline low voltage, extremity leads Probable left ventricular hypertrophy Anterior Q waves, possibly due to LVH Confirmed by Blane Ohara [phone]) on 08/17/2021 8:48:57 AM  Radiology DG Chest 2 View  Result Date: 08/17/2021 CLINICAL DATA:  Provided history: Chest pain. EXAM: CHEST - 2 VIEW COMPARISON:  Prior chest radiographs 07/11/2021 and  earlier. FINDINGS: Heart size within normal limits. No appreciable airspace consolidation or pulmonary edema. No evidence of pleural effusion or pneumothorax. No acute bony abnormality identified. Mild levocurvature of the mid to upper thoracic spine. Partially imaged dextrocurvature of the lumbar spine. Thoracolumbar spondylosis surgical clips within the right upper quadrant of the abdomen. IMPRESSION: No evidence of active cardiopulmonary disease. Electronically Signed   By: Jackey Loge D.O.   On: 08/17/2021 09:08  Procedures Procedures  {Document cardiac monitor, telemetry assessment procedure when appropriate:1}  Medications Ordered in ED Medications  aspirin chewable tablet 324 mg (324 mg Oral Given 08/17/21 1028)    ED Course/ Medical Decision Making/ A&P                           Medical Decision Making Amount and/or Complexity of Data Reviewed Labs: ordered. Radiology: ordered.  Risk OTC drugs.   Patient presents with chest pressure is gradually improved since 4:00 this morning differential including ACS, pleural effusion, reflux, less likely pulm embolism given patient on anticoagulant and no risk factors, other.  Patient well-appearing exam is normal at this time.  Blood work ordered and reviewed normal hemoglobin, white blood count minimally elevated 11, initial troponin negative.  Discussed with Dr. Nadyne Coombes and if second troponin negative to have her go directly to her stress test/cardiology office for evaluation.  Patient given aspirin and no worsening chest discomfort in the ER.  Medical records reviewed with Dr. Nicolette Bang in the last echo was normal ejection fraction. EKG reviewed, no stemi, nonspecific findings. Chest x-ray reviewed no infiltrate or cardiomegaly. Repeat troponin reviewed negative.    {Document critical care time when appropriate:1} {Document review of labs and clinical decision tools ie heart score, Chads2Vasc2 etc:1}  {Document your independent review of  radiology images, and any outside records:1} {Document your discussion with family members, caretakers, and with consultants:1} {Document social determinants of health affecting pt's care:1} {Document your decision making why or why not admission, treatments were needed:1} Final Clinical Impression(s) / ED Diagnoses Final diagnoses:  Acute chest pain    Rx / DC Orders ED Discharge Orders     None

## 2021-08-17 NOTE — Discharge Instructions (Signed)
Go directly to cardiology's office for stress test.

## 2021-08-17 NOTE — ED Notes (Signed)
MD Jodi Mourning spoke to Cardiologist and cardiologist requested we draw second troponin early (around 10:15) so she can make it to her 11am stress test.

## 2021-08-19 ENCOUNTER — Other Ambulatory Visit: Payer: Medicare Other

## 2021-09-07 ENCOUNTER — Other Ambulatory Visit: Payer: Self-pay | Admitting: Family Medicine

## 2021-09-07 DIAGNOSIS — F341 Dysthymic disorder: Secondary | ICD-10-CM

## 2021-09-09 ENCOUNTER — Encounter: Payer: Self-pay | Admitting: Cardiology

## 2021-09-09 ENCOUNTER — Ambulatory Visit: Payer: Medicare Other | Admitting: Cardiology

## 2021-09-09 VITALS — BP 127/83 | HR 67 | Temp 97.3°F | Resp 16 | Ht 61.0 in | Wt 123.0 lb

## 2021-09-09 DIAGNOSIS — I480 Paroxysmal atrial fibrillation: Secondary | ICD-10-CM

## 2021-09-09 NOTE — Progress Notes (Signed)
Follow up visit  Subjective:   Kristy Arias, female    DOB: 1950-10-28, 71 y.o.   MRN: [id number]   HPI  Chief Complaint  Patient presents with   Atrial Fibrillation   Follow-up    67 week    71 y/o Caucasian female with hypertension, hyperlipidemia, hypothyroidism, paroxysmal A-fib  Patient is doing well.  She has not had any recurrent palpitation symptoms.  Frequency of A-fib episodes is generally decreased compared to spring 2023.  Patient is currently on Eliquis for anticoagulation.  As result, she has not been taking her, without which she has had worsening of her arthritis symptoms.  Patient was recently seen in emergency room for chest pain.  ACS was excluded.  She has not had any chest pain since then.  Subsequently, echocardiogram and stress test results are reassuring, details below.   Current Outpatient Medications:    ALPRAZolam (XANAX) 0.5 MG tablet, Take 1 tablet (0.5 mg total) by mouth 2 (two) times daily as needed for anxiety., Disp: 60 tablet, Rfl: 0   apixaban (ELIQUIS) 5 MG TABS tablet, Take 1 tablet (5 mg total) by mouth 2 (two) times daily., Disp: 60 tablet, Rfl: 0   butalbital-acetaminophen-caffeine (FIORICET) 50-325-40 MG tablet, Take 1 tablet by mouth 2 (two) times daily as needed., Disp: , Rfl:    calcium carbonate (TUMS - DOSED IN MG ELEMENTAL CALCIUM) 500 MG chewable tablet, Chew 1 tablet by mouth 2 (two) times daily., Disp: , Rfl:    cetirizine (ZYRTEC) 10 MG tablet, Take 10 mg by mouth at bedtime., Disp: , Rfl:    diltiazem (CARDIZEM CD) 240 MG 24 hr capsule, Take 1 capsule (240 mg total) by mouth daily., Disp: 30 capsule, Rfl: 3   estradiol (VIVELLE-DOT) 0.1 MG/24HR patch, Place 1 patch onto Kristy skin 2 (two) times a week. Sunday & Thursday, Disp: , Rfl:    ezetimibe (ZETIA) 10 MG tablet, TAKE 1 TABLET BY MOUTH EVERY DAY, Disp: 90 tablet, Rfl: 3   levothyroxine (SYNTHROID) 50 MCG tablet, Take 1 tablet by mouth daily before breakfast., Disp: 90 tablet,  Rfl: 0   losartan (COZAAR) 50 MG tablet, TAKE 1 TABLET BY MOUTH ONCE DAILY, Disp: 90 tablet, Rfl: 3   Melatonin 10 MG TABS, Take by mouth at bedtime., Disp: , Rfl:    rosuvastatin (CRESTOR) 5 MG tablet, TAKE 1 TABLET BY MOUTH 3 TIMES WEEKLY, Disp: 90 tablet, Rfl: 3   UNABLE TO FIND, Med Name: Delta 8, Disp: , Rfl:    venlafaxine XR (EFFEXOR-XR) 37.5 MG 24 hr capsule, TAKE 2 CAPSULES BY MOUTH daily WITH BREAKFAST, Disp: 60 capsule, Rfl: 2   Cardiovascular & other pertient studies:  Reviewed external labs and tests, independently interpreted  EKG 08/17/2021: Sinus rhythm 58 bpm LVH  Exercise Tetrofosmin stress test 08/17/2021: Exercise nuclear stress test was performed using Bruce protocol. Patient reached 7.8 METS, and 108% of age predicted maximum heart rate. Exercise capacity was good. No chest pain reported. Resting hypertension 150/80 mmHg with normal heart rate and blood pressure response. Stress EKG revealed no ischemic changes. Normal myocardial perfusion. Stress LVEF 72%. Low risk study.  Echocardiogram 08/12/2021:  Normal LV systolic function with visual EF 60-65%. Left ventricle cavity  is normal in size. Normal left ventricular wall thickness. Normal global  wall motion. Normal diastolic filling pattern, normal LAP.  Mild tricuspid regurgitation. No evidence of pulmonary hypertension.  Mild pulmonic regurgitation.  Pericardium is normal. Insignificant pericardial effusion. There is no  hemodynamic significance.  Compared to 01/11/2019 no significant change.   Recent labs: 08/17/2021: Glucose 114, BUN/Cr 12/0.80. EGFR >60. Na/K 137/4.3. Rest of Kristy CMP normal H/H 14/42. MCV 96. Platelets 292 HbA1C 5.4% Chol 180, TG 109, HDL 71, LDL 87 TSH 2.9 normal Trop HS, 4,5    Review of Systems  Cardiovascular:  Negative for chest pain, dyspnea on exertion, leg swelling, palpitations and syncope.  Musculoskeletal:  Positive for arthritis and joint pain.         Vitals:    09/09/21 1105  BP: 127/83  Pulse: 67  Resp: 16  Temp: (!) 97.3 F (36.3 C)  SpO2: 96%    Body mass index is 23.24 kg/m. Filed Weights   09/09/21 1105  Weight: 123 lb (55.8 kg)     Objective:   Physical Exam Vitals and nursing note reviewed.  Constitutional:      General: She is not in acute distress. Neck:     Vascular: No JVD.  Cardiovascular:     Rate and Rhythm: Normal rate and regular rhythm.     Heart sounds: Normal heart sounds. No murmur heard. Pulmonary:     Effort: Pulmonary effort is normal.     Breath sounds: Normal breath sounds. No wheezing or rales.  Musculoskeletal:     Right lower leg: No edema.     Left lower leg: No edema.             Visit diagnoses:   ICD-10-CM   1. PAF (paroxysmal atrial fibrillation) (HCC)  I48.0 Ambulatory referral to Cardiac Electrophysiology         Assessment & Recommendations:   71 y/o Caucasian female with hypertension, hyperlipidemia, hypothyroidism, paroxysmal A-fib  PAF: Recently improved dose of frequency and severity of PAF symptoms. Continue diltiazem 240 mg daily, losartan 50 mg daily CHA2DS2-VASc score 4, annual stroke risk 5% Currently on Eliquis 5 mg twice daily. Patient has reduced cardio for life due to uncontrolled arthritis pain in absence of NSAIDs use while on Eliquis. Therefore, will refer to Dr. Quentin Arias for consideration for LAA closure. While being evaluated for Kristy same, Dr. Quentin Arias may also consider discussion regarding A-fib ablation.  [facility]as [facility] Referral for Left Atrial Appendage Closure with Non-Valvular Atrial Fibrillation   Kristy Arias is a 71 y.o. female is being referred to Kristy Kristy Arias for evaluation for Left Atrial Appendage Closure with Watchman device for Kristy management of stroke risk resulting form non-valvular atrial fibrillation.    Base upon Kristy Arias's history, she is felt to be a poor candidate for long-term anticoagulation  because of  need to be on NSAIDs for management of arthritis and reduced quality of life without adequate pain control .  For Kristy patient has a HAS-BLED score of 2  indicating a Yearly Major Bleeding Risk of 1.88 %.  Her CHADS2-VASc Score is  4 with an unadjusted Ischemic Stroke Rate (% per year) of 5 %.   Her stroke risk necessitates a strategy of stroke prevention with either long-term oral anticoagulation or left atrial appendage occlusion therapy. We have discussed their bleeding risk in Kristy context of their comorbid medical problems, as well as Kristy rationale for referral for evaluation of Watchman left atrial appendage occlusion therapy. While Kristy patient is at high long-term bleeding risk, they may be appropriate for short-term anticoagulation. Based on this individual patient's stroke and bleeding risk, a shared decision has been made to refer Kristy patient for consideration of Watchman left atrial  appendage closure utilizing Kristy [employer] of Cardiology shared decision tool.   F/u in 3 months    Nigel Mormon, MD Pager: [phone] Office: [phone]

## 2021-09-11 ENCOUNTER — Other Ambulatory Visit: Payer: Self-pay

## 2021-09-11 ENCOUNTER — Encounter: Payer: Self-pay | Admitting: Cardiology

## 2021-09-11 MED ORDER — APIXABAN 5 MG PO TABS: 5.0000 mg | ORAL_TABLET | Freq: Two times a day (BID) | ORAL | 3 refills | Status: DC

## 2021-09-13 ENCOUNTER — Encounter: Payer: Self-pay | Admitting: Cardiology

## 2021-09-14 NOTE — Telephone Encounter (Signed)
From pt

## 2021-09-17 NOTE — Progress Notes (Signed)
Electrophysiology Office Note:    Date:  09/18/2021   ID:  Kristy Arias, DOB 1950/03/19, MRN [id number]  PCP:  Pearline Cables, MD  [facility] Cardiologist:  None  [facility] Electrophysiologist:  Lanier Prude, MD   Referring MD: Pearline Cables, MD   Chief Complaint: Paroxysmal atrial fibrillation  History of Present Illness:    Kristy Arias is a 71 y.o. female who presents for an evaluation of paroxysmal atrial fibrillation at the request of Dr. Rosemary Holms.  The patient has a history of hypertension.  The patient last saw Dr. Rosemary Holms September 09, 2021.  At that visit she reported continued episodes of atrial fibrillation.  She takes Eliquis twice daily for stroke prophylaxis.  The patient has severe arthritis and would like to be to take scheduled NSAIDs but has been unable to do so given her use of anticoagulation.  She is referred for possible watchman implant.  Her episodes of A-fib started 2-1/2 years ago and have increasingly become more frequent.  They are highly symptomatic and she feels wiped out when she is having A-fib.  Episodes last for hours at a time.  She also has chronic arthritis pain in multiple joints.  She has had 2 prior knee replacements.  She tells me that NSAIDs will be important moving forward to help control her pain and maintain her mobility.     Past Medical History:  Diagnosis Date   Anemia    Anxiety    Arthritis    Depression    GERD (gastroesophageal reflux disease)    Glaucoma    Headache    Heart murmur    Hyperlipidemia    Hypertension    Hypothyroidism    Shortness of breath    syndrome x   Syndrome X, cardiac [facility])     Past Surgical History:  Procedure Laterality Date   ABDOMINAL HYSTERECTOMY     BREAST ENHANCEMENT SURGERY     BREAST SURGERY     CARDIAC CATHETERIZATION  4/14   CHOLECYSTECTOMY     COSMETIC SURGERY     DILATION AND CURETTAGE OF UTERUS  2013   EYE SURGERY     LAPAROSCOPIC CHOLECYSTECTOMY  SINGLE PORT N/A 06/29/2012   Procedure: LAPAROSCOPIC CHOLECYSTECTOMY SINGLE PORT IOC ;  Surgeon: Ardeth Sportsman, MD;  Location: [facility] ORS;  Service: General;  Laterality: N/A;   LEFT HEART CATHETERIZATION WITH CORONARY ANGIOGRAM N/A 05/23/2012   Procedure: LEFT HEART CATHETERIZATION WITH CORONARY ANGIOGRAM;  Surgeon: [name], MD;  Location[facility] C[facility] LAB;  Service: Cardiovascular;  Laterality: N/A;   TOTAL KNEE ARTHROPLASTY Right 08/25/2015   Procedure: RIGHT TOTAL KNEE ARTHROPLASTY;  Surgeon: Ollen Gross, MD;  Location: [facility] ORS;  Service: Orthopedics;  Laterality: Right;   TOTAL KNEE ARTHROPLASTY Left 08/09/2016   Procedure: LEFT TOTAL KNEE ARTHROPLASTY;  Surgeon: Ollen Gross, MD;  Location: [facility] ORS;  Service: Orthopedics;  Laterality: Left;    Current Medications: Current Meds  Medication Sig   ALPRAZolam (XANAX) 0.5 MG tablet Take 1 tablet (0.5 mg total) by mouth 2 (two) times daily as needed for anxiety.   apixaban (ELIQUIS) 5 MG TABS tablet Take 1 tablet (5 mg total) by mouth 2 (two) times daily.   butalbital-acetaminophen-caffeine (FIORICET) 50-325-40 MG tablet Take 1 tablet by mouth 2 (two) times daily as needed.   calcium carbonate (TUMS - DOSED IN MG ELEMENTAL CALCIUM) 500 MG chewable tablet Chew 1 tablet by mouth 2 (two) times daily.   cetirizine (ZYRTEC)  10 MG tablet Take 10 mg by mouth at bedtime.   diltiazem (CARDIZEM CD) 240 MG 24 hr capsule Take 1 capsule (240 mg total) by mouth daily.   estradiol (VIVELLE-DOT) 0.1 MG/24HR patch Place 1 patch onto the skin 2 (two) times a week. Sunday & Thursday   ezetimibe (ZETIA) 10 MG tablet TAKE 1 TABLET BY MOUTH EVERY DAY   levothyroxine (SYNTHROID) 50 MCG tablet Take 1 tablet by mouth daily before breakfast.   losartan (COZAAR) 50 MG tablet TAKE 1 TABLET BY MOUTH ONCE DAILY   rosuvastatin (CRESTOR) 5 MG tablet TAKE 1 TABLET BY MOUTH 3 TIMES WEEKLY   UNABLE TO FIND Med Name: Delta 8   venlafaxine XR (EFFEXOR-XR) 37.5 MG 24 hr capsule  TAKE 2 CAPSULES BY MOUTH daily WITH BREAKFAST     Allergies:   Patient has no known allergies.   Social History   Socioeconomic History   Marital status: Married    Spouse name: Not on file   Number of children: 2   Years of education: 17   Highest education level: Not on file  Occupational History   Not on file  Tobacco Use   Smoking status: Never   Smokeless tobacco: Never  Vaping Use   Vaping Use: Never used  Substance and Sexual Activity   Alcohol use: No   Drug use: No   Sexual activity: Yes    Birth control/protection: Surgical  Other Topics Concern   Not on file  Social History Narrative   Right handed   Drinks caffeine prn   Two story home   Social Determinants of Health   Financial Resource Strain: Not on file  Food Insecurity: Not on file  Transportation Needs: Not on file  Physical Activity: Not on file  Stress: Not on file  Social Connections: Not on file     Family History: The patient's family history includes Cancer in her mother and sister; Diabetes in her father and maternal grandfather; Heart disease in her father, maternal grandfather, maternal grandmother, paternal grandfather, and paternal grandmother; Hyperlipidemia in her father, maternal grandfather, and mother; Hypertension in her father, maternal grandmother, and mother; Osteoporosis in her paternal grandmother.  ROS:   Please see the history of present illness.    All other systems reviewed and are negative.  EKGs/Labs/Other Studies Reviewed:    The following studies were reviewed today:  August 12, 2021 echo Normal LV function, 60% Mild TR    Recent Labs: 03/25/2021: ALT 16; TSH 2.92 07/11/2021: Magnesium 1.5 08/17/2021: BUN 12; Creatinine, Ser 0.80; Hemoglobin 14.3; Platelets 292; Potassium 4.3; Sodium 137  Recent Lipid Panel    Component Value Date/Time   CHOL 180 03/25/2021 1116   TRIG 109.0 03/25/2021 1116   HDL 71.20 03/25/2021 1116   CHOLHDL 3 03/25/2021 1116   VLDL  21.8 03/25/2021 1116   LDLCALC 87 03/25/2021 1116    Physical Exam:    VS:  BP 116/74   Pulse 72   Ht 5\' 1"  (1.549 m)   Wt 120 lb 9.6 oz (54.7 kg)   SpO2 99%   BMI 22.79 kg/m     Wt Readings from Last 3 Encounters:  09/18/21 120 lb 9.6 oz (54.7 kg)  09/09/21 123 lb (55.8 kg)  08/17/21 120 lb (54.4 kg)     GEN:  Well nourished, well developed in no acute distress HEENT: Normal NECK: No JVD; No carotid bruits LYMPHATICS: No lymphadenopathy CARDIAC: RRR, no murmurs, rubs, gallops RESPIRATORY:  Clear to auscultation  without rales, wheezing or rhonchi  ABDOMEN: Soft, non-tender, non-distended MUSCULOSKELETAL:  No edema; No deformity  SKIN: Warm and dry NEUROLOGIC:  Alert and oriented x 3 PSYCHIATRIC:  Normal affect       ASSESSMENT:    1. PAF (paroxysmal atrial fibrillation) (HCC)   2. Primary hypertension   3. Pre-op evaluation    PLAN:    In order of problems listed above:  #Paroxysmal atrial fibrillation Symptomatic.  Episodes have become more frequent since the initial diagnosis.  We discussed treatment strategies for her atrial fibrillation including medications and catheter ablation.  She would like to pursue catheter ablation to avoid long-term exposure to medications and the associated side effects.  Discussed treatment options today for his AF including antiarrhythmic drug therapy and ablation. Discussed risks, recovery and likelihood of success. Discussed potential need for repeat ablation procedures and antiarrhythmic drugs after an initial ablation. They wish to proceed with scheduling.  Risk, benefits, and alternatives to EP study and radiofrequency ablation for afib were also discussed in detail today. These risks include but are not limited to stroke, bleeding, vascular damage, tamponade, perforation, damage to the esophagus, lungs, and other structures, pulmonary vein stenosis, worsening renal function, and death. The patient understands these risk and  wishes to proceed.  We will therefore proceed with catheter ablation at the next available time.  Carto, ICE, anesthesia are requested for the procedure.  Will also obtain CT PV protocol prior to the procedure to exclude LAA thrombus and further evaluate atrial anatomy.   ------------------------  I have seen [name] in the office today who is being considered for a Watchman left atrial appendage closure device. I believe they will benefit from this procedure given their history of atrial fibrillation, CHA2DS2-VASc score of 3 and unadjusted ischemic stroke rate of 3.2% per year. Unfortunately, the patient is not felt to be a long term anticoagulation candidate secondary to chronic NSAID use. The patient's chart has been reviewed and I feel that they would be a candidate for short term oral anticoagulation after Watchman implant.   It is my belief that after undergoing a LAA closure procedure, Kristy Arias will not need long term anticoagulation which eliminates anticoagulation side effects and major bleeding risk.   Procedural risks for the Watchman implant have been reviewed with the patient including a 0.5% risk of stroke, <1% risk of perforation and <1% risk of device embolization. Other risks include bleeding, vascular damage, tamponade, worsening renal function, and death. The patient understands these risk and wishes to proceed.     The published clinical data on the safety and effectiveness of WATCHMAN include but are not limited to the following: - Holmes DR, Everlene Farrier, Sick P et al. for the PROTECT AF Investigators. Percutaneous closure of the left atrial appendage versus warfarin therapy for prevention of stroke in patients with atrial fibrillation: a randomised non-inferiority trial. Lancet 2009; 374: 534-42. Everlene Farrier, Doshi SK, Isa Rankin D et al. on behalf of the PROTECT AF Investigators. Percutaneous Left Atrial Appendage Closure for Stroke Prophylaxis in Patients With  Atrial Fibrillation 2.3-Year Follow-up of the PROTECT AF (Watchman Left Atrial Appendage System for Embolic Protection in Patients With Atrial Fibrillation) Trial. Circulation 2013; 127:720-729. - Alli O, Doshi S,  Kar S, Reddy VY, Sievert H et al. Quality of Life Assessment in the Randomized PROTECT AF (Percutaneous Closure of the Left Atrial Appendage Versus Warfarin Therapy for Prevention of Stroke in Patients With Atrial Fibrillation) Trial of  Patients at Risk for Stroke With Nonvalvular Atrial Fibrillation. J Am Coll Cardiol 2013; 61:1790-8. Aline August DR, Mia Creek, Price M, Whisenant B, Sievert H, Doshi S, Huber K, Reddy V. Prospective randomized evaluation of the Watchman left atrial appendage Device in patients with atrial fibrillation versus long-term warfarin therapy; the PREVAIL trial. Journal of the [employer] of Cardiology, Vol. 4, No. 1, 2014, 1-11. - Kar S, Doshi SK, Sadhu A, Horton R, Osorio J et al. Primary outcome evaluation of a next-generation left atrial appendage closure device: results from the PINNACLE FLX trial. Circulation 2021;143(18)1754-1762.    After today's visit with the patient which was dedicated solely for shared decision making visit regarding LAA closure device, the patient decided to proceed with the LAA appendage closure procedure scheduled to be done in the near future at [facility]. Prior to the procedure, I would like to obtain a gated CT scan of the chest with contrast timed for PV/LA visualization.    HAS-BLED score 2 Hypertension Yes  Abnormal renal and liver function (Dialysis, transplant, Cr >2.26 mg/dL /Cirrhosis or Bilirubin >2x Normal or AST/ALT/AP >3x Normal) No  Stroke No  Bleeding No  Labile INR (Unstable/high INR) No  Elderly (>65) Yes  Drugs or alcohol (? 8 drinks/week, anti-plt or NSAID) No   CHA2DS2-VASc Score = 3  The patient's score is based upon: CHF History: 0 HTN History: 1 Diabetes History: 0 Stroke History: 0 Vascular  Disease History: 0 Age Score: 1 Gender Score: 1       Medication Adjustments/Labs and Tests Ordered: Current medicines are reviewed at length with the patient today.  Concerns regarding medicines are outlined above.  Orders Placed This Encounter  Procedures   CT CARDIAC MORPH/PULM VEIN W/CM&W/O CA SCORE   CBC w/Diff   Basic Metabolic Panel (BMET)   ECHOCARDIOGRAM COMPLETE   No orders of the defined types were placed in this encounter.    Signed, Rossie Muskrat. Lalla Brothers, MD, [facility], [facility] 09/18/2021 9:56 AM    Electrophysiology [facility]

## 2021-09-18 ENCOUNTER — Encounter: Payer: Self-pay | Admitting: Cardiology

## 2021-09-18 ENCOUNTER — Ambulatory Visit (INDEPENDENT_AMBULATORY_CARE_PROVIDER_SITE_OTHER): Payer: Medicare Other | Admitting: Cardiology

## 2021-09-18 ENCOUNTER — Encounter: Payer: Self-pay | Admitting: *Deleted

## 2021-09-18 VITALS — BP 116/74 | HR 72 | Ht 61.0 in | Wt 120.6 lb

## 2021-09-18 DIAGNOSIS — I10 Essential (primary) hypertension: Secondary | ICD-10-CM | POA: Diagnosis not present

## 2021-09-18 DIAGNOSIS — Z01818 Encounter for other preprocedural examination: Secondary | ICD-10-CM | POA: Diagnosis not present

## 2021-09-18 DIAGNOSIS — I480 Paroxysmal atrial fibrillation: Secondary | ICD-10-CM

## 2021-09-18 NOTE — Patient Instructions (Addendum)
Medication Instructions:  none *If you need a refill on your cardiac medications before your next appointment, please call your pharmacy*   Lab Work: none If you have labs (blood work) drawn today and your tests are completely normal, you will receive your results only by: MyChart Message (if you have MyChart) OR A paper copy in the mail If you have any lab test that is abnormal or we need to change your treatment, we will call you to review the results.   Testing/Procedures: Your physician has requested that you have an echocardiogram. Echocardiography is a painless test that uses sound waves to create images of your heart. It provides your doctor with information about the size and shape of your heart and how well your heart's chambers and valves are working. This procedure takes approximately one hour. There are no restrictions for this procedure.  Your physician has requested that you have cardiac CT. Cardiac computed tomography (CT) is a painless test that uses an x-ray machine to take clear, detailed pictures of your heart. For further information please visit [email or web address] Please follow instruction sheet as given.   Your physician has recommended that you have an ablation. Catheter ablation is a medical procedure used to treat some cardiac arrhythmias (irregular heartbeats). During catheter ablation, a long, thin, flexible tube is put into a blood vessel in your groin (upper thigh), or neck. This tube is called an ablation catheter. It is then guided to your heart through the blood vessel. Radio frequency waves destroy small areas of heart tissue where abnormal heartbeats may cause an arrhythmia to start. Please see the instruction sheet given to you today.    Follow-Up: At [facility], you and your health needs are our priority.  As part of our continuing mission to provide you with exceptional heart care, we have created designated Provider Care Teams.  These Care Teams  include your primary Cardiologist (physician) and Advanced Practice Providers (APPs -  Physician Assistants and Nurse Practitioners) who all work together to provide you with the care you need, when you need it.  We recommend signing up for the patient portal called "MyChart".  Sign up information is provided on this After Visit Summary.  MyChart is used to connect with patients for Virtual Visits (Telemedicine).  Patients are able to view lab/test results, encounter notes, upcoming appointments, etc.  Non-urgent messages can be sent to your provider as well.   To learn more about what you can do with MyChart, go to [email or web address].    Your next appointment:   Karsten Fells, the Watchman Nurse Navigator, will call you after your CT once the [facility] Team has reviewed your imaging for an update on proceedings. Katy's direct number is [phone] if you need assistance.    Other Instructions   Important Information About Sugar

## 2021-10-02 ENCOUNTER — Ambulatory Visit: Payer: Medicare Other | Attending: Cardiology

## 2021-10-02 ENCOUNTER — Ambulatory Visit (HOSPITAL_COMMUNITY): Payer: Medicare Other | Attending: Cardiology

## 2021-10-02 DIAGNOSIS — I10 Essential (primary) hypertension: Secondary | ICD-10-CM

## 2021-10-02 DIAGNOSIS — Z01818 Encounter for other preprocedural examination: Secondary | ICD-10-CM

## 2021-10-02 DIAGNOSIS — I480 Paroxysmal atrial fibrillation: Secondary | ICD-10-CM

## 2021-10-02 LAB — ECHOCARDIOGRAM COMPLETE
Area-P 1/2: 2.32 cm2
S' Lateral: 2.4 cm

## 2021-10-03 LAB — BASIC METABOLIC PANEL
BUN/Creatinine Ratio: 14 (ref 12–28)
BUN: 13 mg/dL (ref 8–27)
CO2: 20 mmol/L (ref 20–29)
Calcium: 9.2 mg/dL (ref 8.7–10.3)
Chloride: 105 mmol/L (ref 96–106)
Creatinine, Ser: 0.91 mg/dL (ref 0.57–1.00)
Glucose: 109 mg/dL — ABNORMAL HIGH (ref 70–99)
Potassium: 4 mmol/L (ref 3.5–5.2)
Sodium: 141 mmol/L (ref 134–144)
eGFR: 68 mL/min/{1.73_m2} (ref >59)

## 2021-10-03 LAB — CBC WITH DIFFERENTIAL/PLATELET
Basophils Absolute: 0 10*3/uL (ref 0.0–0.2)
Basos: 0 %
EOS (ABSOLUTE): 0 10*3/uL (ref 0.0–0.4)
Eos: 0 %
Hematocrit: 42.3 % (ref 34.0–46.6)
Hemoglobin: 14.2 g/dL (ref 11.1–15.9)
Immature Grans (Abs): 0 10*3/uL (ref 0.0–0.1)
Immature Granulocytes: 0 %
Lymphocytes Absolute: 1.4 10*3/uL (ref 0.7–3.1)
Lymphs: 18 %
MCH: 32.3 pg (ref 26.6–33.0)
MCHC: 33.6 g/dL (ref 31.5–35.7)
MCV: 96 fL (ref 79–97)
Monocytes Absolute: 0.7 10*3/uL (ref 0.1–0.9)
Monocytes: 9 %
Neutrophils Absolute: 5.4 10*3/uL (ref 1.4–7.0)
Neutrophils: 73 %
Platelets: 291 10*3/uL (ref 150–450)
RBC: 4.4 x10E6/uL (ref 3.77–5.28)
RDW: 12.3 % (ref 11.7–15.4)
WBC: 7.5 10*3/uL (ref 3.4–10.8)

## 2021-10-07 ENCOUNTER — Other Ambulatory Visit: Payer: Self-pay | Admitting: Family Medicine

## 2021-10-07 ENCOUNTER — Telehealth: Payer: Self-pay | Admitting: Cardiology

## 2021-10-07 DIAGNOSIS — E039 Hypothyroidism, unspecified: Secondary | ICD-10-CM

## 2021-10-07 NOTE — Telephone Encounter (Signed)
Notified patient okay to proceed with shingles shot. Verbalized understanding.

## 2021-10-07 NOTE — Telephone Encounter (Signed)
New message  Pt is due for 2nd shingles shot. She would like to know if it's okay that she gets it this week before her procedure on 09.29.23

## 2021-10-16 ENCOUNTER — Telehealth (HOSPITAL_COMMUNITY): Payer: Self-pay | Admitting: Emergency Medicine

## 2021-10-16 ENCOUNTER — Encounter (HOSPITAL_COMMUNITY): Payer: Self-pay

## 2021-10-16 NOTE — Telephone Encounter (Signed)
Reaching out to patient to offer assistance regarding upcoming cardiac imaging study; pt verbalizes understanding of appt date/time, parking situation and where to check in, pre-test NPO status and medications ordered, and verified current allergies; name and call back number provided for further questions should they arise Rael Yo RN Navigator Cardiac Imaging [facility] [phone] office [phone] cell 

## 2021-10-19 ENCOUNTER — Ambulatory Visit (HOSPITAL_COMMUNITY)
Admission: RE | Admit: 2021-10-19 | Discharge: 2021-10-19 | Disposition: A | Discharge status: Home / Self Care | Payer: Medicare Other | Source: Ambulatory Visit | Attending: Cardiology | Admitting: Cardiology

## 2021-10-19 ENCOUNTER — Ambulatory Visit (HOSPITAL_BASED_OUTPATIENT_CLINIC_OR_DEPARTMENT_OTHER): Admission: RE | Admit: 2021-10-19 | Payer: Medicare Other | Source: Ambulatory Visit

## 2021-10-19 DIAGNOSIS — I480 Paroxysmal atrial fibrillation: Secondary | ICD-10-CM | POA: Diagnosis not present

## 2021-10-19 DIAGNOSIS — Z01818 Encounter for other preprocedural examination: Secondary | ICD-10-CM

## 2021-10-19 DIAGNOSIS — I10 Essential (primary) hypertension: Secondary | ICD-10-CM

## 2021-10-19 MED ORDER — NITROGLYCERIN 0.4 MG SL SUBL: 0.8000 mg | SUBLINGUAL_TABLET | Freq: Once | SUBLINGUAL | Status: DC

## 2021-10-22 ENCOUNTER — Encounter (HOSPITAL_COMMUNITY): Payer: Self-pay | Admitting: Cardiology

## 2021-10-22 NOTE — Pre-Procedure Instructions (Signed)
Instructed patient on the following items: Arrival time 0830 Nothing to eat or drink after midnight No meds AM of procedure Responsible person to drive you home and stay with you for 24 hrs  Have you missed any doses of anti-coagulant Eliquis- hasn't missed any doses   

## 2021-10-23 ENCOUNTER — Ambulatory Visit (HOSPITAL_BASED_OUTPATIENT_CLINIC_OR_DEPARTMENT_OTHER): Payer: Medicare Other | Admitting: Anesthesiology

## 2021-10-23 ENCOUNTER — Ambulatory Visit (HOSPITAL_COMMUNITY)
Admission: RE | Admit: 2021-10-23 | Discharge: 2021-10-23 | Disposition: A | Discharge status: Home / Self Care | Payer: Medicare Other | Attending: Cardiology | Admitting: Cardiology

## 2021-10-23 ENCOUNTER — Other Ambulatory Visit: Payer: Self-pay

## 2021-10-23 ENCOUNTER — Ambulatory Visit (HOSPITAL_COMMUNITY): Payer: Medicare Other | Admitting: Anesthesiology

## 2021-10-23 ENCOUNTER — Encounter (HOSPITAL_COMMUNITY): Payer: Self-pay | Admitting: Cardiology

## 2021-10-23 ENCOUNTER — Other Ambulatory Visit (HOSPITAL_COMMUNITY): Payer: Self-pay

## 2021-10-23 ENCOUNTER — Encounter (HOSPITAL_COMMUNITY)
Admission: RE | Disposition: A | Discharge status: Home / Self Care | Payer: Self-pay | Source: Home / Self Care | Attending: Cardiology

## 2021-10-23 DIAGNOSIS — Z7901 Long term (current) use of anticoagulants: Secondary | ICD-10-CM | POA: Insufficient documentation

## 2021-10-23 DIAGNOSIS — I10 Essential (primary) hypertension: Secondary | ICD-10-CM | POA: Insufficient documentation

## 2021-10-23 DIAGNOSIS — I480 Paroxysmal atrial fibrillation: Secondary | ICD-10-CM | POA: Diagnosis present

## 2021-10-23 DIAGNOSIS — K219 Gastro-esophageal reflux disease without esophagitis: Secondary | ICD-10-CM | POA: Insufficient documentation

## 2021-10-23 DIAGNOSIS — E039 Hypothyroidism, unspecified: Secondary | ICD-10-CM | POA: Insufficient documentation

## 2021-10-23 DIAGNOSIS — I4891 Unspecified atrial fibrillation: Secondary | ICD-10-CM | POA: Diagnosis not present

## 2021-10-23 DIAGNOSIS — F418 Other specified anxiety disorders: Secondary | ICD-10-CM

## 2021-10-23 HISTORY — PX: ATRIAL FIBRILLATION ABLATION: EP1191

## 2021-10-23 LAB — POCT ACTIVATED CLOTTING TIME
Activated Clotting Time: 263 seconds
Activated Clotting Time: 311 seconds

## 2021-10-23 SURGERY — ATRIAL FIBRILLATION ABLATION
Anesthesia: General

## 2021-10-23 MED ORDER — COLCHICINE 0.6 MG PO TABS
0.6000 mg | ORAL_TABLET | Freq: Two times a day (BID) | ORAL | Status: DC
  Administered 2021-10-23: 0.6 mg via ORAL
  Filled 2021-10-23: qty 1

## 2021-10-23 MED ORDER — ONDANSETRON HCL 4 MG/2ML IJ SOLN
INTRAMUSCULAR | Status: DC | PRN
  Administered 2021-10-23: 4 mg via INTRAVENOUS

## 2021-10-23 MED ORDER — SUGAMMADEX SODIUM 200 MG/2ML IV SOLN
INTRAVENOUS | Status: DC | PRN
  Administered 2021-10-23: 110 mg via INTRAVENOUS

## 2021-10-23 MED ORDER — HEPARIN (PORCINE) IN NACL 1000-0.9 UT/500ML-% IV SOLN
INTRAVENOUS | Status: AC
  Filled 2021-10-23: qty 500

## 2021-10-23 MED ORDER — PHENYLEPHRINE HCL-NACL 20-0.9 MG/250ML-% IV SOLN
INTRAVENOUS | Status: DC | PRN
  Administered 2021-10-23: 25 ug/min via INTRAVENOUS

## 2021-10-23 MED ORDER — HEPARIN SODIUM (PORCINE) 1000 UNIT/ML IJ SOLN
INTRAMUSCULAR | Status: DC | PRN
  Administered 2021-10-23: 1000 [IU] via INTRAVENOUS

## 2021-10-23 MED ORDER — LIDOCAINE 2% (20 MG/ML) 5 ML SYRINGE
INTRAMUSCULAR | Status: DC | PRN
  Administered 2021-10-23: 60 mg via INTRAVENOUS

## 2021-10-23 MED ORDER — PANTOPRAZOLE SODIUM 40 MG PO TBEC
40.0000 mg | DELAYED_RELEASE_TABLET | Freq: Every day | ORAL | Status: DC
  Administered 2021-10-23: 40 mg via ORAL
  Filled 2021-10-23: qty 1

## 2021-10-23 MED ORDER — ACETAMINOPHEN 500 MG PO TABS
ORAL_TABLET | ORAL | Status: AC
  Filled 2021-10-23: qty 2

## 2021-10-23 MED ORDER — SODIUM CHLORIDE 0.9 % IV SOLN: INTRAVENOUS | Status: DC

## 2021-10-23 MED ORDER — PROPOFOL 10 MG/ML IV BOLUS
INTRAVENOUS | Status: DC | PRN
  Administered 2021-10-23: 110 mg via INTRAVENOUS

## 2021-10-23 MED ORDER — MIDAZOLAM HCL 5 MG/5ML IJ SOLN
INTRAMUSCULAR | Status: DC | PRN
  Administered 2021-10-23: 2 mg via INTRAVENOUS

## 2021-10-23 MED ORDER — COLCHICINE 0.6 MG PO TABS
0.6000 mg | ORAL_TABLET | Freq: Two times a day (BID) | ORAL | 0 refills | Status: DC
  Filled 2021-10-23: qty 10, 5d supply, fill #0

## 2021-10-23 MED ORDER — ACETAMINOPHEN 325 MG PO TABS: 650.0000 mg | ORAL_TABLET | ORAL | Status: DC | PRN

## 2021-10-23 MED ORDER — ONDANSETRON HCL 4 MG/2ML IJ SOLN: 4.0000 mg | Freq: Four times a day (QID) | INTRAMUSCULAR | Status: DC | PRN

## 2021-10-23 MED ORDER — PANTOPRAZOLE SODIUM 40 MG PO TBEC
40.0000 mg | DELAYED_RELEASE_TABLET | Freq: Every day | ORAL | 0 refills | Status: DC
  Filled 2021-10-23: qty 45, 45d supply, fill #0

## 2021-10-23 MED ORDER — HEPARIN (PORCINE) IN NACL 1000-0.9 UT/500ML-% IV SOLN
INTRAVENOUS | Status: DC | PRN
  Administered 2021-10-23 (×3): 500 mL

## 2021-10-23 MED ORDER — SODIUM CHLORIDE 0.9 % IV SOLN: 250.0000 mL | INTRAVENOUS | Status: DC | PRN

## 2021-10-23 MED ORDER — HEPARIN SODIUM (PORCINE) 1000 UNIT/ML IJ SOLN
INTRAMUSCULAR | Status: AC
  Filled 2021-10-23: qty 10

## 2021-10-23 MED ORDER — ROCURONIUM BROMIDE 10 MG/ML (PF) SYRINGE
PREFILLED_SYRINGE | INTRAVENOUS | Status: DC | PRN
  Administered 2021-10-23: 50 mg via INTRAVENOUS

## 2021-10-23 MED ORDER — HEPARIN SODIUM (PORCINE) 1000 UNIT/ML IJ SOLN
INTRAMUSCULAR | Status: DC | PRN
  Administered 2021-10-23: 7000 [IU] via INTRAVENOUS
  Administered 2021-10-23: 4000 [IU] via INTRAVENOUS

## 2021-10-23 MED ORDER — PROTAMINE SULFATE 10 MG/ML IV SOLN
INTRAVENOUS | Status: DC | PRN
  Administered 2021-10-23 (×3): 10 mg via INTRAVENOUS

## 2021-10-23 MED ORDER — FENTANYL CITRATE (PF) 250 MCG/5ML IJ SOLN
INTRAMUSCULAR | Status: DC | PRN
  Administered 2021-10-23 (×2): 50 ug via INTRAVENOUS

## 2021-10-23 MED ORDER — DEXAMETHASONE SODIUM PHOSPHATE 10 MG/ML IJ SOLN
INTRAMUSCULAR | Status: DC | PRN
  Administered 2021-10-23: 10 mg via INTRAVENOUS

## 2021-10-23 MED ORDER — SODIUM CHLORIDE 0.9% FLUSH: 3.0000 mL | Freq: Two times a day (BID) | INTRAVENOUS | Status: DC

## 2021-10-23 MED ORDER — SODIUM CHLORIDE 0.9% FLUSH: 3.0000 mL | INTRAVENOUS | Status: DC | PRN

## 2021-10-23 MED ORDER — APIXABAN 5 MG PO TABS
5.0000 mg | ORAL_TABLET | Freq: Two times a day (BID) | ORAL | Status: DC
  Administered 2021-10-23: 5 mg via ORAL
  Filled 2021-10-23: qty 1

## 2021-10-23 MED ORDER — ACETAMINOPHEN 500 MG PO TABS
1000.0000 mg | ORAL_TABLET | Freq: Once | ORAL | Status: AC
  Administered 2021-10-23: 1000 mg via ORAL

## 2021-10-23 SURGICAL SUPPLY — 16 items
BAG SNAP BAND KOVER 36X36 (MISCELLANEOUS) IMPLANT
CATH ABLAT QDOT MICRO BI TC DF (CATHETERS) IMPLANT
CATH OCTARAY 2.0 F 3-3-3-3-3 (CATHETERS) IMPLANT
CATH S-M CIRCA TEMP PROBE (CATHETERS) IMPLANT
CATH SOUNDSTAR ECO 8FR (CATHETERS) IMPLANT
CATH WEB BI DIR CSDF CRV REPRO (CATHETERS) IMPLANT
CLOSURE PERCLOSE PROSTYLE (VASCULAR PRODUCTS) IMPLANT
COVER SWIFTLINK CONNECTOR (BAG) ×1 IMPLANT
PACK EP LATEX FREE (CUSTOM PROCEDURE TRAY) ×1
PACK EP LF (CUSTOM PROCEDURE TRAY) ×1 IMPLANT
PAD DEFIB RADIO PHYSIO CONN (PAD) ×1 IMPLANT
PATCH CARTO3 (PAD) IMPLANT
SHEATH BAYLIS TRANSSEPTAL 98CM (NEEDLE) IMPLANT
SHEATH CARTO VIZIGO SM CVD (SHEATH) IMPLANT
SHEATH PROBE COVER 6X72 (BAG) IMPLANT
TUBING SMART ABLATE COOLFLOW (TUBING) IMPLANT

## 2021-10-23 NOTE — Transfer of Care (Signed)
Immediate Anesthesia Transfer of Care Note  Patient: Kristy Arias  Procedure(s) Performed: ATRIAL FIBRILLATION ABLATION  Patient Location: Cath Lab  Anesthesia Type:General  Level of Consciousness: oriented, drowsy and patient cooperative  Airway & Oxygen Therapy: Patient Spontanous Breathing and Patient connected to nasal cannula oxygen  Post-op Assessment: Report given to RN and Post -op Vital signs reviewed and stable  Post vital signs: Reviewed  Last Vitals:  Vitals Value Taken Time  BP 147/69 10/23/21 1211  Temp 36.5 C 10/23/21 1206  Pulse 68 10/23/21 1213  Resp 18 10/23/21 1213  SpO2 94 % 10/23/21 1213  Vitals shown include unvalidated device data.  Last Pain:  Vitals:   10/23/21 1206  TempSrc: Temporal  PainSc: 0-No pain         Complications: There were no known notable events for this encounter.

## 2021-10-23 NOTE — Discharge Instructions (Addendum)
Post procedure care instructions No driving for 4 days. No lifting over 5 lbs for 1 week. No vigorous or sexual activity for 1 week. Keep procedure site clean & dry. If you notice increased pain, swelling, bleeding or pus, call/return!  You may shower after 24 hours, but no soaking in baths/hot tubs/pools for 1 week.    You have an appointment set up with the [facility].  Multiple studies have shown that being followed by a dedicated atrial fibrillation clinic in addition to the standard care you receive from your other physicians improves health. We believe that enrollment in the atrial fibrillation clinic will allow [place] to better care for you.   The phone number to the [facility] is [phone]. The clinic is staffed Monday through Friday from 8:30am to 5pm.  Parking Directions: The clinic is located in the Heart and Vascular Building connected to [facility]. 1)From [address] turn on to [address] and go to the 3rd entrance  (Heart and Vascular entrance) on the right. 2)Look to the right for Heart &Vascular Parking Garage. 3)A code for the entrance is required. The code is 1504  4)Take the elevators to the 1st floor. Registration is in the room with the glass walls at the end of the hallway.  If you have any trouble parking or locating the clinic, please don't hesitate to call [phone].   Cardiac Ablation, Care After  This sheet gives you information about how to care for yourself after your procedure. Your health care provider may also give you more specific instructions. If you have problems or questions, contact your health care provider. What can I expect after the procedure? After the procedure, it is common to have: Bruising around your puncture site. Tenderness around your puncture site. Skipped heartbeats. Tiredness (fatigue).  Follow these instructions at home: Puncture site care  Follow instructions from your health care  provider about how to take care of your puncture site. Make sure you: If present, leave stitches (sutures), skin glue, or adhesive strips in place. These skin closures may need to stay in place for up to 2 weeks. If adhesive strip edges start to loosen and curl up, you may trim the loose edges. Do not remove adhesive strips completely unless your health care provider tells you to do that. If a large square bandage is present, this may be removed 24 hours after surgery.  Check your puncture site every day for signs of infection. Check for: Redness, swelling, or pain. Fluid or blood. If your puncture site starts to bleed, lie down on your back, apply firm pressure to the area, and contact your health care provider. Warmth. Pus or a bad smell. A pea or small marble sized lump at the site is normal and can take up to three months to resolve.  Driving Do not drive for at least 4 days after your procedure or however long your health care provider recommends. (Do not resume driving if you have previously been instructed not to drive for other health reasons.) Do not drive or use heavy machinery while taking prescription pain medicine. Activity Avoid activities that take a lot of effort for at least 7 days after your procedure. Do not lift anything that is heavier than 5 lb (4.5 kg) for one week.  No sexual activity for 1 week.  Return to your normal activities as told by your health care provider. Ask your health care provider what activities are safe for you. General instructions  Take over-the-counter and prescription medicines only as told by your health care provider. Do not use any products that contain nicotine or tobacco, such as cigarettes and e-cigarettes. If you need help quitting, ask your health care provider. You may shower after 24 hours, but Do not take baths, swim, or use a hot tub for 1 week.  Do not drink alcohol for 24 hours after your procedure. Keep all follow-up visits as told by  your health care provider. This is important. Contact a health care provider if: You have redness, mild swelling, or pain around your puncture site. You have fluid or blood coming from your puncture site that stops after applying firm pressure to the area. Your puncture site feels warm to the touch. You have pus or a bad smell coming from your puncture site. You have a fever. You have chest pain or discomfort that spreads to your neck, jaw, or arm. You are sweating a lot. You feel nauseous. You have a fast or irregular heartbeat. You have shortness of breath. You are dizzy or light-headed and feel the need to lie down. You have pain or numbness in the arm or leg closest to your puncture site. Get help right away if: Your puncture site suddenly swells. Your puncture site is bleeding and the bleeding does not stop after applying firm pressure to the area. These symptoms may represent a serious problem that is an emergency. Do not wait to see if the symptoms will go away. Get medical help right away. Call your local emergency services (911 in the [place] Do not drive yourself to the hospital. Summary After the procedure, it is normal to have bruising and tenderness at the puncture site in your groin, neck, or forearm. Check your puncture site every day for signs of infection. Get help right away if your puncture site is bleeding and the bleeding does not stop after applying firm pressure to the area. This is a medical emergency. This information is not intended to replace advice given to you by your health care provider. Make sure you discuss any questions you have with your health care provider.

## 2021-10-23 NOTE — Anesthesia Postprocedure Evaluation (Signed)
Anesthesia Post Note  Patient: Kristy Arias  Procedure(s) Performed: ATRIAL FIBRILLATION ABLATION     Patient location during evaluation: PACU Anesthesia Type: General Level of consciousness: awake and alert Pain management: pain level controlled Vital Signs Assessment: post-procedure vital signs reviewed and stable Respiratory status: spontaneous breathing, nonlabored ventilation and respiratory function stable Cardiovascular status: blood pressure returned to baseline and stable Postop Assessment: no apparent nausea or vomiting Anesthetic complications: no   There were no known notable events for this encounter.  Last Vitals:  Vitals:   10/23/21 1355 10/23/21 1400  BP:  128/72  Pulse: 71 70  Resp: 19 14  Temp:    SpO2: 95% 95%    Last Pain:  Vitals:   10/23/21 1245  TempSrc:   PainSc: 0-No pain                 Aurelius Gildersleeve,W. EDMOND

## 2021-10-23 NOTE — Anesthesia Preprocedure Evaluation (Addendum)
Anesthesia Evaluation  Patient identified by MRN, date of birth, ID band Patient awake    Reviewed: Allergy & Precautions, H&P , NPO status , Patient's Chart, lab work & pertinent test results  Airway Mallampati: II  TM Distance: >3 FB Neck ROM: Full    Dental no notable dental hx. (+) Teeth Intact, Dental Advisory Given   Pulmonary neg pulmonary ROS,    Pulmonary exam normal breath sounds clear to auscultation       Cardiovascular hypertension, On Medications + dysrhythmias Atrial Fibrillation  Rhythm:Regular Rate:Normal     Neuro/Psych  Headaches, Anxiety Depression    GI/Hepatic Neg liver ROS, GERD  ,  Endo/Other  Hypothyroidism   Renal/GU negative Renal ROS  negative genitourinary   Musculoskeletal  (+) Arthritis , Osteoarthritis,    Abdominal   Peds  Hematology  (+) Blood dyscrasia, anemia ,   Anesthesia Other Findings   Reproductive/Obstetrics negative OB ROS                            Anesthesia Physical Anesthesia Plan  ASA: 3  Anesthesia Plan: General   Post-op Pain Management: Tylenol PO (pre-op)*   Induction: Intravenous  PONV Risk Score and Plan: 4 or greater and Ondansetron, Dexamethasone and Treatment may vary due to age or medical condition  Airway Management Planned: Oral ETT  Additional Equipment:   Intra-op Plan:   Post-operative Plan: Extubation in OR  Informed Consent: I have reviewed the patients History and Physical, chart, labs and discussed the procedure including the risks, benefits and alternatives for the proposed anesthesia with the patient or authorized representative who has indicated his/her understanding and acceptance.     Dental advisory given  Plan Discussed with: CRNA  Anesthesia Plan Comments:         Anesthesia Quick Evaluation

## 2021-10-23 NOTE — H&P (Signed)
Electrophysiology Office Note:     Date:  10/23/2021    ID:  Kristy Arias, DOB 08/11/1950, MRN [id number]   PCP:  Pearline Cables, MD      [facility] Cardiologist:  None  [facility] Electrophysiologist:  Lanier Prude, MD    Referring MD: Pearline Cables, MD    Chief Complaint: Paroxysmal atrial fibrillation   History of Present Illness:     Kristy Arias is a 71 y.o. female who presents for an evaluation of paroxysmal atrial fibrillation at the request of Dr. Rosemary Holms.  The patient has a history of hypertension.  The patient last saw Dr. Rosemary Holms September 09, 2021.  At that visit she reported continued episodes of atrial fibrillation.  She takes Eliquis twice daily for stroke prophylaxis.  The patient has severe arthritis and would like to be to take scheduled NSAIDs but has been unable to do so given her use of anticoagulation.  She is referred for possible watchman implant.   Her episodes of A-fib started 2-1/2 years ago and have increasingly become more frequent.  They are highly symptomatic and she feels wiped out when she is having A-fib.  Episodes last for hours at a time.  She also has chronic arthritis pain in multiple joints.  She has had 2 prior knee replacements.  She tells me that NSAIDs will be important moving forward to help control her pain and maintain her mobility.    Today she presents for PVI.    Objective      Past Medical History:  Diagnosis Date   Anemia     Anxiety     Arthritis     Depression     GERD (gastroesophageal reflux disease)     Glaucoma     Headache     Heart murmur     Hyperlipidemia     Hypertension     Hypothyroidism     Shortness of breath      syndrome x   Syndrome X, cardiac [facility])             Past Surgical History:  Procedure Laterality Date   ABDOMINAL HYSTERECTOMY       BREAST ENHANCEMENT SURGERY       BREAST SURGERY       CARDIAC CATHETERIZATION   4/14   CHOLECYSTECTOMY       COSMETIC SURGERY        DILATION AND CURETTAGE OF UTERUS   2013   EYE SURGERY       LAPAROSCOPIC CHOLECYSTECTOMY SINGLE PORT N/A 06/29/2012    Procedure: LAPAROSCOPIC CHOLECYSTECTOMY SINGLE PORT IOC ;  Surgeon: Ardeth Sportsman, MD;  Location: [facility]S;  Service: General;  Laterality: N/A;   LEFT HEART CATHETERIZATION WITH CORONARY ANGIOGRAM N/A 05/23/2012    Procedure: LEFT HEART CATHETERIZATION WITH CORONARY ANGIOGRAM;  Surgeon: [name], MD;  [facility];  Service: Cardiovascular;  Laterality: N/A;   TOTAL KNEE ARTHROPLASTY Right 08/25/2015    Procedure: RIGHT TOTAL KNEE ARTHROPLASTY;  Surgeon: Ollen Gross, MD;  Location: [facility];  Service: Orthopedics;  Laterality: Right;   TOTAL KNEE ARTHROPLASTY Left 08/09/2016    Procedure: LEFT TOTAL KNEE ARTHROPLASTY;  Surgeon: Ollen Gross, MD;  Location: [facility];  Service: Orthopedics;  Laterality: Left;      Current Medications: Active Medications      Current Meds  Medication Sig   ALPRAZolam (XANAX) 0.5 MG tablet Take 1 tablet (0.5 mg total) by mouth 2 (  two) times daily as needed for anxiety.   apixaban (ELIQUIS) 5 MG TABS tablet Take 1 tablet (5 mg total) by mouth 2 (two) times daily.   butalbital-acetaminophen-caffeine (FIORICET) 50-325-40 MG tablet Take 1 tablet by mouth 2 (two) times daily as needed.   calcium carbonate (TUMS - DOSED IN MG ELEMENTAL CALCIUM) 500 MG chewable tablet Chew 1 tablet by mouth 2 (two) times daily.   cetirizine (ZYRTEC) 10 MG tablet Take 10 mg by mouth at bedtime.   diltiazem (CARDIZEM CD) 240 MG 24 hr capsule Take 1 capsule (240 mg total) by mouth daily.   estradiol (VIVELLE-DOT) 0.1 MG/24HR patch Place 1 patch onto the skin 2 (two) times a week. Sunday & Thursday   ezetimibe (ZETIA) 10 MG tablet TAKE 1 TABLET BY MOUTH EVERY DAY   levothyroxine (SYNTHROID) 50 MCG tablet Take 1 tablet by mouth daily before breakfast.   losartan (COZAAR) 50 MG tablet TAKE 1 TABLET BY MOUTH ONCE DAILY   rosuvastatin (CRESTOR) 5 MG tablet TAKE  1 TABLET BY MOUTH 3 TIMES WEEKLY   UNABLE TO FIND Med Name: Delta 8   venlafaxine XR (EFFEXOR-XR) 37.5 MG 24 hr capsule TAKE 2 CAPSULES BY MOUTH daily WITH BREAKFAST        Allergies:   Patient has no known allergies.    Social History         Socioeconomic History   Marital status: Married      Spouse name: Not on file   Number of children: 2   Years of education: 17   Highest education level: Not on file  Occupational History   Not on file  Tobacco Use   Smoking status: Never   Smokeless tobacco: Never  Vaping Use   Vaping Use: Never used  Substance and Sexual Activity   Alcohol use: No   Drug use: No   Sexual activity: Yes      Birth control/protection: Surgical  Other Topics Concern   Not on file  Social History Narrative    Right handed    Drinks caffeine prn    Two story home    Social Determinants of Health    Financial Resource Strain: Not on file  Food Insecurity: Not on file  Transportation Needs: Not on file  Physical Activity: Not on file  Stress: Not on file  Social Connections: Not on file      Family History: The patient's family history includes Cancer in her mother and sister; Diabetes in her father and maternal grandfather; Heart disease in her father, maternal grandfather, maternal grandmother, paternal grandfather, and paternal grandmother; Hyperlipidemia in her father, maternal grandfather, and mother; Hypertension in her father, maternal grandmother, and mother; Osteoporosis in her paternal grandmother.   ROS:   Please see the history of present illness.    All other systems reviewed and are negative.   EKGs/Labs/Other Studies Reviewed:     The following studies were reviewed today:   August 12, 2021 echo Normal LV function, 60% Mild TR       Recent Labs: 03/25/2021: ALT 16; TSH 2.92 07/11/2021: Magnesium 1.5 08/17/2021: BUN 12; Creatinine, Ser 0.80; Hemoglobin 14.3; Platelets 292; Potassium 4.3; Sodium 137  Recent Lipid Panel Labs  (Brief)          Component Value Date/Time    CHOL 180 03/25/2021 1116    TRIG 109.0 03/25/2021 1116    HDL 71.20 03/25/2021 1116    CHOLHDL 3 03/25/2021 1116    VLDL 21.8 03/25/2021 1116  LDLCALC 87 03/25/2021 1116        Physical Exam:     VS:  BP 141/80   Pulse 60   Ht 5\' 1"  (1.549 m)   Wt 120 lb 9.6 oz (54.7 kg)   SpO2 99%   BMI 22.79 kg/m         Wt Readings from Last 3 Encounters:  09/18/21 120 lb 9.6 oz (54.7 kg)  09/09/21 123 lb (55.8 kg)  08/17/21 120 lb (54.4 kg)      GEN:  Well nourished, well developed in no acute distress HEENT: Normal NECK: No JVD; No carotid bruits LYMPHATICS: No lymphadenopathy CARDIAC: RRR, no murmurs, rubs, gallops RESPIRATORY:  Clear to auscultation without rales, wheezing or rhonchi  ABDOMEN: Soft, non-tender, non-distended MUSCULOSKELETAL:  No edema; No deformity  SKIN: Warm and dry NEUROLOGIC:  Alert and oriented x 3 PSYCHIATRIC:  Normal affect          Assessment ASSESSMENT:     1. PAF (paroxysmal atrial fibrillation) (HCC)   2. Primary hypertension   3. Pre-op evaluation     PLAN:     In order of problems listed above:   #Paroxysmal atrial fibrillation Symptomatic.  Episodes have become more frequent since the initial diagnosis.  We discussed treatment strategies for her atrial fibrillation including medications and catheter ablation.  She would like to pursue catheter ablation to avoid long-term exposure to medications and the associated side effects.   Discussed treatment options today for his AF including antiarrhythmic drug therapy and ablation. Discussed risks, recovery and likelihood of success. Discussed potential need for repeat ablation procedures and antiarrhythmic drugs after an initial ablation. They wish to proceed with scheduling.   Risk, benefits, and alternatives to EP study and radiofrequency ablation for afib were also discussed in detail today. These risks include but are not limited to stroke,  bleeding, vascular damage, tamponade, perforation, damage to the esophagus, lungs, and other structures, pulmonary vein stenosis, worsening renal function, and death. The patient understands these risk and wishes to proceed.  We will therefore proceed with catheter ablation at the next available time.  Carto, ICE, anesthesia are requested for the procedure.  Will also obtain CT PV protocol prior to the procedure to exclude LAA thrombus and further evaluate atrial anatomy.  Today she presents for PVI. Procedure reviewed.  08/19/21 T. Sheria Lang, MD, [facility], [facility] Cardiac Electrophysiology

## 2021-10-23 NOTE — Anesthesia Procedure Notes (Signed)
Procedure Name: Intubation Date/Time: 10/23/2021 10:22 AM  Performed by: Jenne Campus, CRNAPre-anesthesia Checklist: Patient identified, Emergency Drugs available, Suction available and Patient being monitored Patient Re-evaluated:Patient Re-evaluated prior to induction Oxygen Delivery Method: Circle System Utilized Preoxygenation: Pre-oxygenation with 100% oxygen Induction Type: IV induction Ventilation: Mask ventilation without difficulty Laryngoscope Size: Miller and 3 Grade View: Grade II Tube type: Oral Tube size: 7.0 mm Number of attempts: 1 Airway Equipment and Method: Stylet and Oral airway Placement Confirmation: ETT inserted through vocal cords under direct vision, positive ETCO2 and breath sounds checked- equal and bilateral Secured at: 22 cm Tube secured with: Tape Dental Injury: Teeth and Oropharynx as per pre-operative assessment

## 2021-10-26 ENCOUNTER — Encounter (HOSPITAL_COMMUNITY): Payer: Self-pay | Admitting: Cardiology

## 2021-10-27 ENCOUNTER — Other Ambulatory Visit: Payer: Self-pay

## 2021-10-27 DIAGNOSIS — M179 Osteoarthritis of knee, unspecified: Secondary | ICD-10-CM

## 2021-10-27 DIAGNOSIS — I480 Paroxysmal atrial fibrillation: Secondary | ICD-10-CM

## 2021-10-27 NOTE — Progress Notes (Signed)
Thank you MJP

## 2021-11-20 ENCOUNTER — Ambulatory Visit (HOSPITAL_COMMUNITY)
Admission: RE | Admit: 2021-11-20 | Discharge: 2021-11-20 | Disposition: A | Discharge status: Home / Self Care | Payer: Medicare Other | Source: Ambulatory Visit | Attending: Physician Assistant | Admitting: Physician Assistant

## 2021-11-20 ENCOUNTER — Encounter (HOSPITAL_COMMUNITY): Payer: Self-pay | Admitting: Physician Assistant

## 2021-11-20 VITALS — BP 112/82 | HR 96 | Ht 62.0 in | Wt 121.0 lb

## 2021-11-20 DIAGNOSIS — I480 Paroxysmal atrial fibrillation: Secondary | ICD-10-CM | POA: Diagnosis present

## 2021-11-20 DIAGNOSIS — E039 Hypothyroidism, unspecified: Secondary | ICD-10-CM | POA: Insufficient documentation

## 2021-11-20 DIAGNOSIS — I10 Essential (primary) hypertension: Secondary | ICD-10-CM | POA: Diagnosis not present

## 2021-11-20 DIAGNOSIS — E785 Hyperlipidemia, unspecified: Secondary | ICD-10-CM | POA: Diagnosis not present

## 2021-11-20 DIAGNOSIS — D6869 Other thrombophilia: Secondary | ICD-10-CM | POA: Insufficient documentation

## 2021-11-20 DIAGNOSIS — Z7901 Long term (current) use of anticoagulants: Secondary | ICD-10-CM | POA: Insufficient documentation

## 2021-11-20 NOTE — Progress Notes (Signed)
Primary Care Physician: Darreld Mclean, MD Primary Cardiologist: Dr Virgina Jock Primary Electrophysiologist: Dr Quentin Ore Referring Physician: Dr Judie Petit is a 71 y.o. female with a history of HTN, HLD, hypothyroidism, atrial fibrillation who presents for follow up in the [facility]. Patient is on Eliquis for a CHADS2VASC score of 3. She underwent afib ablation with Dr Quentin Ore on 10/23/21. Since then, she has felt well although her Jodelle Red mobile has shown "possible afib" during the past week. Strips reviewed which show some afib and others with SR with PACs. Today, she is in SR with some brief runs of disorganized atrial activity. She does not have the same dizziness that she did with her afib previously. She denies chest pain, swallowing pain, or groin issues. She is scheduled for a Watchman implant on 02/04/22.  Today, she denies symptoms of palpitations, chest pain, shortness of breath, orthopnea, PND, lower extremity edema, dizziness, presyncope, syncope, snoring, daytime somnolence, bleeding, or neurologic sequela. The patient is tolerating medications without difficulties and is otherwise without complaint today.    Atrial Fibrillation Risk Factors:  she does not have symptoms or diagnosis of sleep apnea. she does not have a history of rheumatic fever.   she has a BMI of Body mass index is 22.13 kg/m.Marland Kitchen Filed Weights   11/20/21 1018  Weight: 54.9 kg    Family History  Problem Relation Age of Onset   Cancer Mother    Hyperlipidemia Mother    Hypertension Mother    Hypertension Father    Diabetes Father    Heart disease Father    Hyperlipidemia Father    Cancer Sister    Hypertension Maternal Grandmother    Heart disease Maternal Grandmother    Diabetes Maternal Grandfather    Hyperlipidemia Maternal Grandfather    Heart disease Maternal Grandfather    Osteoporosis Paternal Grandmother    Heart disease Paternal Grandmother     Heart disease Paternal Grandfather      Atrial Fibrillation Management history:  Previous antiarrhythmic drugs: none Previous cardioversions: none Previous ablations: 10/23/21 CHADS2VASC score: 3 Anticoagulation history: Eliquis   Past Medical History:  Diagnosis Date   Anemia    Anxiety    Arthritis    Depression    GERD (gastroesophageal reflux disease)    Glaucoma    Headache    Heart murmur    Hyperlipidemia    Hypertension    Hypothyroidism    Shortness of breath    syndrome x   Syndrome X, cardiac    Past Surgical History:  Procedure Laterality Date   ABDOMINAL HYSTERECTOMY     ATRIAL FIBRILLATION ABLATION N/A 10/23/2021   Procedure: ATRIAL FIBRILLATION ABLATION;  Surgeon: Vickie Epley, MD;  Location: [facility];  Service: Cardiovascular;  Laterality: N/A;   BREAST ENHANCEMENT SURGERY     BREAST SURGERY     CARDIAC CATHETERIZATION  4/14   CHOLECYSTECTOMY     COSMETIC SURGERY     DILATION AND CURETTAGE OF UTERUS  2013   EYE SURGERY     LAPAROSCOPIC CHOLECYSTECTOMY SINGLE PORT N/A 06/29/2012   Procedure: LAPAROSCOPIC CHOLECYSTECTOMY SINGLE PORT IOC ;  Surgeon: Adin Hector, MD;  Location: [facility];  Service: General;  Laterality: N/A;   LEFT HEART CATHETERIZATION WITH CORONARY ANGIOGRAM N/A 05/23/2012   Procedure: LEFT HEART CATHETERIZATION WITH CORONARY ANGIOGRAM;  Surgeon: Laverda Page, MD;  Location: [facility];  Service: Cardiovascular;  Laterality: N/A;  TOTAL KNEE ARTHROPLASTY Right 08/25/2015   Procedure: RIGHT TOTAL KNEE ARTHROPLASTY;  Surgeon: Gaynelle Arabian, MD;  Location: [facility];  Service: Orthopedics;  Laterality: Right;   TOTAL KNEE ARTHROPLASTY Left 08/09/2016   Procedure: LEFT TOTAL KNEE ARTHROPLASTY;  Surgeon: Gaynelle Arabian, MD;  Location: [facility];  Service: Orthopedics;  Laterality: Left;    Current Outpatient Medications  Medication Sig Dispense Refill   ALPRAZolam (XANAX) 0.5 MG tablet Take 1 tablet (0.5 mg total) by mouth  2 (two) times daily as needed for anxiety. 60 tablet 0   apixaban (ELIQUIS) 5 MG TABS tablet Take 1 tablet (5 mg total) by mouth 2 (two) times daily. 60 tablet 3   Biotin w/ Vitamins C & E (HAIR/SKIN/NAILS PO) Take 1 tablet by mouth in the morning.     butalbital-acetaminophen-caffeine (FIORICET) 50-325-40 MG tablet Take 1 tablet by mouth 2 (two) times daily as needed for headache.     calcium carbonate (TUMS - DOSED IN MG ELEMENTAL CALCIUM) 500 MG chewable tablet Chew 1 tablet by mouth 2 (two) times daily as needed for heartburn or indigestion.     CANNABIDIOL PO Take 1 tablet by mouth at bedtime as needed (sleep). Sleep Gummy with Delta 8 THC     cetirizine (ZYRTEC) 10 MG tablet Take 10 mg by mouth daily as needed for allergies.     Cyanocobalamin (B-12) 3000 MCG SUBL Take 3,000 mcg by mouth in the morning.     diltiazem (CARDIZEM CD) 240 MG 24 hr capsule Take 1 capsule (240 mg total) by mouth daily. 30 capsule 3   estradiol (VIVELLE-DOT) 0.1 MG/24HR patch Place 1 patch onto the skin 2 (two) times a week. Wednesdays & Saturdays.     ezetimibe (ZETIA) 10 MG tablet TAKE 1 TABLET BY MOUTH EVERY DAY 90 tablet 3   levothyroxine (SYNTHROID) 50 MCG tablet Take 1 tablet (50 mcg total) by mouth daily before breakfast. 90 tablet 0   losartan (COZAAR) 50 MG tablet TAKE 1 TABLET BY MOUTH ONCE DAILY 90 tablet 3   pantoprazole (PROTONIX) 40 MG tablet Take 1 tablet (40 mg total) by mouth daily. 45 tablet 0   rosuvastatin (CRESTOR) 5 MG tablet TAKE 1 TABLET BY MOUTH 3 TIMES WEEKLY 90 tablet 3   venlafaxine XR (EFFEXOR-XR) 37.5 MG 24 hr capsule TAKE 2 CAPSULES BY MOUTH daily WITH BREAKFAST 60 capsule 2   vitamin D3 (CHOLECALCIFEROL) 25 MCG tablet Take 1,000 Units by mouth in the morning.     No current facility-administered medications for this encounter.    No Known Allergies  Social History   Socioeconomic History   Marital status: Married    Spouse name: Not on file   Number of children: 2   Years  of education: 17   Highest education level: Not on file  Occupational History   Not on file  Tobacco Use   Smoking status: Never   Smokeless tobacco: Never   Tobacco comments:    Never smoke 11/20/21  Vaping Use   Vaping Use: Never used  Substance and Sexual Activity   Alcohol use: No   Drug use: No   Sexual activity: Yes    Birth control/protection: Surgical  Other Topics Concern   Not on file  Social History Narrative   Right handed   Drinks caffeine prn   Two story home   Social Determinants of Health   Financial Resource Strain: Not on file  Food Insecurity: Not on file  Transportation Needs: Not on file  Physical Activity: Not on file  Stress: Not on file  Social Connections: Not on file  Intimate Partner Violence: Not on file     ROS- All systems are reviewed and negative except as per the HPI above.  Physical Exam: Vitals:   11/20/21 1018  BP: 112/82  Pulse: 96  Weight: 54.9 kg  Height: 5\' 2"  (1.575 m)    GEN- The patient is a well appearing female, alert and oriented x 3 today.   Head- normocephalic, atraumatic Eyes-  Sclera clear, conjunctiva pink Ears- hearing intact Oropharynx- clear Neck- supple  Lungs- Clear to ausculation bilaterally, normal work of breathing Heart- slightly irregular rate and rhythm, no murmurs, rubs or gallops  GI- soft, NT, ND, + BS Extremities- no clubbing, cyanosis, or edema MS- no significant deformity or atrophy Skin- no rash or lesion Psych- euthymic mood, full affect Neuro- strength and sensation are intact  Wt Readings from Last 3 Encounters:  11/20/21 54.9 kg  10/23/21 54.4 kg  09/18/21 54.7 kg    EKG today demonstrates  SR, with frequent PACs (brief run of disorganized atrial activity) Vent. rate 96 BPM PR interval 184 ms QRS duration 80 ms QT/QTcB 386/487 ms  Echo 10/02/21 demonstrated   1. Left ventricular ejection fraction, by estimation, is 60 to 65%. The  left ventricle has normal function. The  left ventricle has no regional  wall motion abnormalities. Left ventricular diastolic parameters are  consistent with Grade I diastolic dysfunction (impaired relaxation).   2. Right ventricular systolic function is normal. The right ventricular  size is normal. There is normal pulmonary artery systolic pressure.   3. Left atrial size was moderately dilated.   4. The mitral valve is normal in structure. No evidence of mitral valve  regurgitation. No evidence of mitral stenosis.   5. The aortic valve is tricuspid. Aortic valve regurgitation is not  visualized. No aortic stenosis is present.   6. The inferior vena cava is normal in size with greater than 50%  respiratory variability, suggesting right atrial pressure of 3 mmHg.   Epic records are reviewed at length today  CHA2DS2-VASc Score = 3  The patient's score is based upon: CHF History: 0 HTN History: 1 Diabetes History: 0 Stroke History: 0 Vascular Disease History: 0 Age Score: 1 Gender Score: 1       ASSESSMENT AND PLAN: 1. Paroxysmal Atrial Fibrillation (ICD10:  I48.0) The patient's CHA2DS2-VASc score is 3, indicating a 3.2% annual risk of stroke.   S/p afib ablation 10/23/21 Patient having frequent ectopy, hopefully will improve as she continues to heal from ablation.  Continue Eliquis 5 mg BID. Scheduled for Watchman implant in January.  Continue diltiazem 240 mg daily  2. Secondary Hypercoagulable State (ICD10:  D68.69) The patient is at significant risk for stroke/thromboembolism based upon her CHA2DS2-VASc Score of 3.  Continue Apixaban (Eliquis).   3. HTN Stable, no changes today.    Follow up with the structural heart team as scheduled.   [facility] [address] [place], [place] [address] [phone] 11/20/2021 10:49 AM

## 2021-11-27 ENCOUNTER — Other Ambulatory Visit: Payer: Self-pay

## 2021-11-27 MED ORDER — DILTIAZEM HCL ER COATED BEADS 240 MG PO CP24: 240.0000 mg | ORAL_CAPSULE | Freq: Every day | ORAL | 1 refills | Status: DC

## 2021-12-09 ENCOUNTER — Encounter: Payer: Self-pay | Admitting: Family Medicine

## 2021-12-09 ENCOUNTER — Other Ambulatory Visit: Payer: Self-pay | Admitting: Family Medicine

## 2021-12-09 ENCOUNTER — Ambulatory Visit (INDEPENDENT_AMBULATORY_CARE_PROVIDER_SITE_OTHER): Payer: Medicare Other | Admitting: *Deleted

## 2021-12-09 DIAGNOSIS — Z0000 Encounter for general adult medical examination without abnormal findings: Secondary | ICD-10-CM | POA: Diagnosis not present

## 2021-12-09 NOTE — Patient Instructions (Signed)
Kristy Arias , Thank you for taking time to come for your Medicare Wellness Visit. I appreciate your ongoing commitment to your health goals. Please review the following plan we discussed and let me know if I can assist you in the future.   These are the goals we discussed:  Goals   None     This is a list of the screening recommended for you and due dates:  Health Maintenance  Topic Date Due   Cologuard (Stool DNA test)  03/07/2021   Mammogram  01/10/2022   Medicare Annual Wellness Visit  12/10/2022   Tetanus Vaccine  01/27/2025   Pneumonia Vaccine  Completed   Flu Shot  Completed   DEXA scan (bone density measurement)  Completed   COVID-19 Vaccine  Completed   Hepatitis C Screening: USPSTF Recommendation to screen - Ages 2-79 yo.  Completed   Zoster (Shingles) Vaccine  Completed   HPV Vaccine  Aged Out     Next appointment: Follow up in one year for your annual wellness visit.   Preventive Care 45 Years and Older, Female Preventive care refers to lifestyle choices and visits with your health care provider that can promote health and wellness. What does preventive care include? A yearly physical exam. This is also called an annual well check. Dental exams once or twice a year. Routine eye exams. Ask your health care provider how often you should have your eyes checked. Personal lifestyle choices, including: Daily care of your teeth and gums. Regular physical activity. Eating a healthy diet. Avoiding tobacco and drug use. Limiting alcohol use. Practicing safe sex. Taking low-dose aspirin every day. Taking vitamin and mineral supplements as recommended by your health care provider. What happens during an annual well check? The services and screenings done by your health care provider during your annual well check will depend on your age, overall health, lifestyle risk factors, and family history of disease. Counseling  Your health care provider may ask you questions about  your: Alcohol use. Tobacco use. Drug use. Emotional well-being. Home and relationship well-being. Sexual activity. Eating habits. History of falls. Memory and ability to understand (cognition). Work and work Astronomer. Reproductive health. Screening  You may have the following tests or measurements: Height, weight, and BMI. Blood pressure. Lipid and cholesterol levels. These may be checked every 5 years, or more frequently if you are over 53 years old. Skin check. Lung cancer screening. You may have this screening every year starting at age 69 if you have a 30-pack-year history of smoking and currently smoke or have quit within the past 15 years. Fecal occult blood test (FOBT) of the stool. You may have this test every year starting at age 71. Flexible sigmoidoscopy or colonoscopy. You may have a sigmoidoscopy every 5 years or a colonoscopy every 10 years starting at age 37. Hepatitis C blood test. Hepatitis B blood test. Sexually transmitted disease (STD) testing. Diabetes screening. This is done by checking your blood sugar (glucose) after you have not eaten for a while (fasting). You may have this done every 1-3 years. Bone density scan. This is done to screen for osteoporosis. You may have this done starting at age 22. Mammogram. This may be done every 1-2 years. Talk to your health care provider about how often you should have regular mammograms. Talk with your health care provider about your test results, treatment options, and if necessary, the need for more tests. Vaccines  Your health care provider may recommend certain vaccines, such  as: Influenza vaccine. This is recommended every year. Tetanus, diphtheria, and acellular pertussis (Tdap, Td) vaccine. You may need a Td booster every 10 years. Zoster vaccine. You may need this after age 15. Pneumococcal 13-valent conjugate (PCV13) vaccine. One dose is recommended after age 35. Pneumococcal polysaccharide (PPSV23) vaccine.  One dose is recommended after age 93. Talk to your health care provider about which screenings and vaccines you need and how often you need them. This information is not intended to replace advice given to you by your health care provider. Make sure you discuss any questions you have with your health care provider. Document Released: 02/07/2015 Document Revised: 10/01/2015 Document Reviewed: 11/12/2014 Elsevier Interactive Patient Education  2017 [place] Prevention in the Home Falls can cause injuries. They can happen to people of all ages. There are many things you can do to make your home safe and to help prevent falls. What can I do on the outside of my home? Regularly fix the edges of walkways and driveways and fix any cracks. Remove anything that might make you trip as you walk through a door, such as a raised step or threshold. Trim any bushes or trees on the path to your home. Use bright outdoor lighting. Clear any walking paths of anything that might make someone trip, such as rocks or tools. Regularly check to see if handrails are loose or broken. Make sure that both sides of any steps have handrails. Any raised decks and porches should have guardrails on the edges. Have any leaves, snow, or ice cleared regularly. Use sand or salt on walking paths during winter. Clean up any spills in your garage right away. This includes oil or grease spills. What can I do in the bathroom? Use night lights. Install grab bars by the toilet and in the tub and shower. Do not use towel bars as grab bars. Use non-skid mats or decals in the tub or shower. If you need to sit down in the shower, use a plastic, non-slip stool. Keep the floor dry. Clean up any water that spills on the floor as soon as it happens. Remove soap buildup in the tub or shower regularly. Attach bath mats securely with double-sided non-slip rug tape. Do not have throw rugs and other things on the floor that can make  you trip. What can I do in the bedroom? Use night lights. Make sure that you have a light by your bed that is easy to reach. Do not use any sheets or blankets that are too big for your bed. They should not hang down onto the floor. Have a firm chair that has side arms. You can use this for support while you get dressed. Do not have throw rugs and other things on the floor that can make you trip. What can I do in the kitchen? Clean up any spills right away. Avoid walking on wet floors. Keep items that you use a lot in easy-to-reach places. If you need to reach something above you, use a strong step stool that has a grab bar. Keep electrical cords out of the way. Do not use floor polish or wax that makes floors slippery. If you must use wax, use non-skid floor wax. Do not have throw rugs and other things on the floor that can make you trip. What can I do with my stairs? Do not leave any items on the stairs. Make sure that there are handrails on both sides of the stairs and use  them. Fix handrails that are broken or loose. Make sure that handrails are as long as the stairways. Check any carpeting to make sure that it is firmly attached to the stairs. Fix any carpet that is loose or worn. Avoid having throw rugs at the top or bottom of the stairs. If you do have throw rugs, attach them to the floor with carpet tape. Make sure that you have a light switch at the top of the stairs and the bottom of the stairs. If you do not have them, ask someone to add them for you. What else can I do to help prevent falls? Wear shoes that: Do not have high heels. Have rubber bottoms. Are comfortable and fit you well. Are closed at the toe. Do not wear sandals. If you use a stepladder: Make sure that it is fully opened. Do not climb a closed stepladder. Make sure that both sides of the stepladder are locked into place. Ask someone to hold it for you, if possible. Clearly mark and make sure that you can  see: Any grab bars or handrails. First and last steps. Where the edge of each step is. Use tools that help you move around (mobility aids) if they are needed. These include: Canes. Walkers. Scooters. Crutches. Turn on the lights when you go into a dark area. Replace any light bulbs as soon as they burn out. Set up your furniture so you have a clear path. Avoid moving your furniture around. If any of your floors are uneven, fix them. If there are any pets around you, be aware of where they are. Review your medicines with your doctor. Some medicines can make you feel dizzy. This can increase your chance of falling. Ask your doctor what other things that you can do to help prevent falls. This information is not intended to replace advice given to you by your health care provider. Make sure you discuss any questions you have with your health care provider. Document Released: 11/07/2008 Document Revised: 06/19/2015 Document Reviewed: 02/15/2014 Elsevier Interactive Patient Education  2017 [employer].

## 2021-12-09 NOTE — Progress Notes (Signed)
Subjective:  Patient did e-check in and answered some questions on 12/07/21.  I verified answers with patient today.     Kristy Arias is a 71 y.o. female who presents for Medicare Annual (Subsequent) preventive examination.  I connected with  [name] on 12/09/21 by a audio enabled telemedicine application and verified that I am speaking with the correct person using two identifiers.  Patient Location: Home  Provider Location: Office/Clinic  I discussed the limitations of evaluation and management by telemedicine. The patient expressed understanding and agreed to proceed.   Review of Systems    Defer to PCP Cardiac Risk Factors include: dyslipidemia;hypertension;advanced age (>45men, >77 women)     Objective:    There were no vitals filed for this visit. There is no height or weight on file to calculate BMI.     12/09/2021    1:46 PM 10/23/2021    8:55 AM 07/11/2021   10:06 AM 05/06/2021    3:42 PM 04/02/2021    9:49 AM 12/29/2018    3:32 PM 08/09/2016   12:22 PM  Advanced Directives  Does Patient Have a Medical Advance Directive? Yes No Yes Yes Yes No Yes  Type of Estate agent of [place];Living will  Healthcare Power of [place];Living will    Healthcare Power of Attorney  Does patient want to make changes to medical advance directive?       No - Patient declined  Copy of Healthcare Power of Attorney in Chart? No - copy requested      No - copy requested  Would patient like information on creating a medical advance directive?  No - Patient declined         Current Medications (verified) Outpatient Encounter Medications as of 12/09/2021  Medication Sig   apixaban (ELIQUIS) 5 MG TABS tablet Take 1 tablet (5 mg total) by mouth 2 (two) times daily.   Biotin w/ Vitamins C & E (HAIR/SKIN/NAILS PO) Take 1 tablet by mouth in the morning.   butalbital-acetaminophen-caffeine (FIORICET) 50-325-40 MG tablet Take 1 tablet by mouth 2 (two) times daily as  needed for headache.   calcium carbonate (TUMS - DOSED IN MG ELEMENTAL CALCIUM) 500 MG chewable tablet Chew 1 tablet by mouth 2 (two) times daily as needed for heartburn or indigestion.   CANNABIDIOL PO Take 1 tablet by mouth at bedtime as needed (sleep). Sleep Gummy with Delta 8 THC   cetirizine (ZYRTEC) 10 MG tablet Take 10 mg by mouth daily as needed for allergies.   Cyanocobalamin (B-12) 3000 MCG SUBL Take 3,000 mcg by mouth in the morning.   diltiazem (CARDIZEM CD) 240 MG 24 hr capsule Take 1 capsule (240 mg total) by mouth daily.   estradiol (VIVELLE-DOT) 0.1 MG/24HR patch Place 1 patch onto the skin 2 (two) times a week. Wednesdays & Saturdays.   ezetimibe (ZETIA) 10 MG tablet TAKE 1 TABLET BY MOUTH EVERY DAY   levothyroxine (SYNTHROID) 50 MCG tablet Take 1 tablet (50 mcg total) by mouth daily before breakfast.   losartan (COZAAR) 50 MG tablet TAKE 1 TABLET BY MOUTH ONCE DAILY   pantoprazole (PROTONIX) 40 MG tablet Take 1 tablet (40 mg total) by mouth daily.   rosuvastatin (CRESTOR) 5 MG tablet TAKE 1 TABLET BY MOUTH 3 TIMES WEEKLY   venlafaxine XR (EFFEXOR-XR) 37.5 MG 24 hr capsule TAKE 2 CAPSULES BY MOUTH daily WITH BREAKFAST   vitamin D3 (CHOLECALCIFEROL) 25 MCG tablet Take 1,000 Units by mouth in the morning.   [  DISCONTINUED] ALPRAZolam (XANAX) 0.5 MG tablet Take 1 tablet (0.5 mg total) by mouth 2 (two) times daily as needed for anxiety.   No facility-administered encounter medications on file as of 12/09/2021.    Allergies (verified) Patient has no known allergies.   History: Past Medical History:  Diagnosis Date   Anemia    Anxiety    Arthritis    Depression    GERD (gastroesophageal reflux disease)    Glaucoma    Headache    Heart murmur    Hyperlipidemia    Hypertension    Hypothyroidism    Shortness of breath    syndrome x   Syndrome X, cardiac    Past Surgical History:  Procedure Laterality Date   ABDOMINAL HYSTERECTOMY     ATRIAL FIBRILLATION ABLATION N/A  10/23/2021   Procedure: ATRIAL FIBRILLATION ABLATION;  Surgeon: Lanier PrudeLambert, Cameron T, MD;  Location: [facility];  Service: Cardiovascular;  Laterality: N/A;   BREAST ENHANCEMENT SURGERY     BREAST SURGERY     CARDIAC CATHETERIZATION  4/14   CHOLECYSTECTOMY     COSMETIC SURGERY     DILATION AND CURETTAGE OF UTERUS  2013   EYE SURGERY     LAPAROSCOPIC CHOLECYSTECTOMY SINGLE PORT N/A 06/29/2012   Procedure: LAPAROSCOPIC CHOLECYSTECTOMY SINGLE PORT IOC ;  Surgeon: Ardeth SportsmanSteven C. Gross, MD;  Location: [facility];  Service: General;  Laterality: N/A;   LEFT HEART CATHETERIZATION WITH CORONARY ANGIOGRAM N/A 05/23/2012   Procedure: LEFT HEART CATHETERIZATION WITH CORONARY ANGIOGRAM;  Surgeon: Pamella PertJagadeesh R Ganji, MD;  [facility];  Service: Cardiovascular;  Laterality: N/A;   TOTAL KNEE ARTHROPLASTY Right 08/25/2015   Procedure: RIGHT TOTAL KNEE ARTHROPLASTY;  Surgeon: Ollen GrossFrank Aluisio, MD;  Location: [facility];  Service: Orthopedics;  Laterality: Right;   TOTAL KNEE ARTHROPLASTY Left 08/09/2016   Procedure: LEFT TOTAL KNEE ARTHROPLASTY;  Surgeon: Ollen GrossAluisio, Frank, MD;  Location: [facility];  Service: Orthopedics;  Laterality: Left;   Family History  Problem Relation Age of Onset   Cancer Mother    Hyperlipidemia Mother    Hypertension Mother    Hypertension Father    Diabetes Father    Heart disease Father    Hyperlipidemia Father    Cancer Sister    Hypertension Maternal Grandmother    Heart disease Maternal Grandmother    Diabetes Maternal Grandfather    Hyperlipidemia Maternal Grandfather    Heart disease Maternal Grandfather    Osteoporosis Paternal Grandmother    Heart disease Paternal Grandmother    Heart disease Paternal Grandfather    Social History   Socioeconomic History   Marital status: Married    Spouse name: Not on file   Number of children: 2   Years of education: 17   Highest education level: Not on file  Occupational History   Not on file  Tobacco Use   Smoking status:  Never   Smokeless tobacco: Never   Tobacco comments:    Never smoke 11/20/21  Vaping Use   Vaping Use: Never used  Substance and Sexual Activity   Alcohol use: No   Drug use: No   Sexual activity: Yes    Birth control/protection: Surgical  Other Topics Concern   Not on file  Social History Narrative   Right handed   Drinks caffeine prn   Two story home   Social Determinants of Health   Financial Resource Strain: Low Risk  (12/09/2021)   Overall Financial Resource Strain (CARDIA)    Difficulty of  Paying Living Expenses: Not hard at all  Food Insecurity: No Food Insecurity (12/09/2021)   Hunger Vital Sign    Worried About Running Out of Food in the Last Year: Never true    Ran Out of Food in the Last Year: Never true  Transportation Needs: No Transportation Needs (12/09/2021)   PRAPARE - Administrator, Civil Service (Medical): No    Lack of Transportation (Non-Medical): No  Physical Activity: Inactive (12/09/2021)   Exercise Vital Sign    Days of Exercise per Week: 0 days    Minutes of Exercise per Session: 0 min  Stress: No Stress Concern Present (12/09/2021)   [employer] of Occupational Health - Occupational Stress Questionnaire    Feeling of Stress : Not at all  Social Connections: Not on file    Tobacco Counseling Counseling given: Not Answered Tobacco comments: Never smoke 11/20/21   Clinical Intake:  Pre-visit preparation completed: Yes  Pain : No/denies pain  Diabetes: No  How often do you need to have someone help you when you read instructions, pamphlets, or other written materials from your doctor or pharmacy?: (P) 1 - Never  Activities of Daily Living    12/07/2021    8:31 PM 10/23/2021    8:53 AM  In your present state of health, do you have any difficulty performing the following activities:  Hearing? 0 0  Vision? 0 0  Difficulty concentrating or making decisions? 1 0  Walking or climbing stairs? 0 0  Dressing or bathing?  0 0  Doing errands, shopping? 0   Preparing Food and eating ? N   Using the Toilet? N   In the past six months, have you accidently leaked urine? Y   Do you have problems with loss of bowel control? N   Managing your Medications? N   Managing your Finances? N   Housekeeping or managing your Housekeeping? N     Patient Care Team: Copland, Gwenlyn Found, MD as PCP - General (Family Medicine) Lanier Prude, MD as PCP - Electrophysiology (Cardiology) Earl Many, MD as Referring Physician (Cardiology)  Indicate any recent Medical Services you may have received from other than [facility] providers in the past year (date may be approximate).     Assessment:   This is a routine wellness examination for Kristy Arias.  Hearing/Vision screen No results found.  Dietary issues and exercise activities discussed: Current Exercise Habits: Home exercise routine, Type of exercise: walking, Time (Minutes): 30, Frequency (Times/Week): 3, Weekly Exercise (Minutes/Week): 90, Intensity: Mild, Exercise limited by: None identified   Goals Addressed   None    Depression Screen    12/09/2021    1:49 PM 03/25/2021   10:46 AM 03/20/2020   10:09 AM 02/23/2017   10:48 AM 08/26/2016    1:54 PM 01/28/2015    8:28 AM 10/22/2014   10:08 AM  PHQ 2/9 Scores  PHQ - 2 Score 1 0 0 0 1 0 0    Fall Risk    12/09/2021    1:48 PM 12/07/2021    8:31 PM 05/06/2021    3:42 PM 04/02/2021    9:49 AM 03/25/2021   10:46 AM  Fall Risk   Falls in the past year? 1 1 0 0 0  Number falls in past yr: 0 0 0 0 0  Injury with Fall? 0 1 0 0 0  Risk for fall due to : No Fall Risks      Follow up Falls  evaluation completed        FALL RISK PREVENTION PERTAINING TO THE HOME:  Any stairs in or around the home? Yes  If so, are there any without handrails? No  Home free of loose throw rugs in walkways, pet beds, electrical cords, etc? Yes  Adequate lighting in your home to reduce risk of falls? Yes   ASSISTIVE DEVICES UTILIZED TO  PREVENT FALLS:  Life alert? No  Use of a cane, walker or w/c? No  Grab bars in the bathroom? No  Shower chair or bench in shower? Yes  Elevated toilet seat or a handicapped toilet? Yes   TIMED UP AND GO:  Was the test performed?  No, audio visit .    Cognitive Function:      04/02/2021   10:00 AM 05/31/2014    8:34 AM  Montreal Cognitive Assessment   Visuospatial/ Executive (0/5) 4 5  Naming (0/3) 3 3  Attention: Read list of digits (0/2) 2 2  Attention: Read list of letters (0/1) 1 1  Attention: Serial 7 subtraction starting at 100 (0/3) 3 3  Language: Repeat phrase (0/2) 1 2  Language : Fluency (0/1) 1 1  Abstraction (0/2) 2 2  Delayed Recall (0/5) 5 5  Orientation (0/6) 5 6  Total 27 30  Adjusted Score (based on education) 27 30      12/09/2021    2:06 PM  6CIT Screen  What Year? 0 points  What month? 0 points  What time? 0 points  Count back from 20 0 points  Months in reverse 0 points  Repeat phrase 0 points  Total Score 0 points    Immunizations Immunization History  Administered Date(s) Administered   Fluad Quad(high Dose 65+) 11/16/2018   Influenza Split 11/28/2021   Influenza, High Dose Seasonal PF 10/28/2017   Influenza,inj,Quad PF,6+ Mos 10/22/2014   Influenza-Unspecified 10/25/2013, 11/09/2015, 10/26/2019, 09/25/2020   PFIZER(Purple Top)SARS-COV-2 Vaccination 02/16/2019, 03/09/2019, 09/26/2019   Pfizer Covid-19 Vaccine Bivalent Booster 88yrs & up 09/25/2020, 07/10/2021   Pneumococcal Conjugate-13 02/23/2018   Pneumococcal Polysaccharide-23 03/20/2020   Rsv, Bivalent, Protein Subunit Rsvpref,pf Verdis Frederickson) 11/28/2021   Td 01/28/2015   Tdap 01/26/2004   Zoster Recombinat (Shingrix) 04/30/2021, 10/07/2021   Zoster, Live 04/26/2011    TDAP status: Up to date  Flu Vaccine status: Up to date  Pneumococcal vaccine status: Up to date  Covid-19 vaccine status: Information provided on how to obtain vaccines.   Qualifies for Shingles Vaccine? Yes    Zostavax completed Yes   Shingrix Completed?: Yes  Screening Tests Health Maintenance  Topic Date Due   Medicare Annual Wellness (AWV)  Never done   Fecal DNA (Cologuard)  03/07/2021   MAMMOGRAM  01/10/2022   TETANUS/TDAP  01/27/2025   Pneumonia Vaccine 36+ Years old  Completed   INFLUENZA VACCINE  Completed   DEXA SCAN  Completed   COVID-19 Vaccine  Completed   Hepatitis C Screening  Completed   Zoster Vaccines- Shingrix  Completed   HPV VACCINES  Aged Out    Health Maintenance  Health Maintenance Due  Topic Date Due   Medicare Annual Wellness (AWV)  Never done   Fecal DNA (Cologuard)  03/07/2021    Colorectal cancer screening: Type of screening: Colonoscopy. Completed 05/29/21 pt reported [facility]. Repeat every 10 years  Mammogram status: Completed 01/11/20. Repeat every year  Bone Density status: GYN will order  Lung Cancer Screening: (Low Dose CT Chest recommended if Age 7-80 years, 30 pack-year currently smoking  OR have quit w/in 15years.) does not qualify.    Additional Screening:  Hepatitis C Screening: does qualify; Completed 01/28/15  Vision Screening: Recommended annual ophthalmology exams for early detection of glaucoma and other disorders of the eye. Is the patient up to date with their annual eye exam?  Yes  Who is the provider or what is the name of the office in which the patient attends annual eye exams? Dr. Leveda Anna If pt is not established with a provider, would they like to be referred to a provider to establish care? No .   Dental Screening: Recommended annual dental exams for proper oral hygiene  Community Resource Referral / Chronic Care Management: CRR required this visit?  No   CCM required this visit?  No      Plan:     I have personally reviewed and noted the following in the patient's chart:   Medical and social history Use of alcohol, tobacco or illicit drugs  Current medications and supplements including opioid  prescriptions. Patient is not currently taking opioid prescriptions. Functional ability and status Nutritional status Physical activity Advanced directives List of other physicians Hospitalizations, surgeries, and ER visits in previous 12 months Vitals Screenings to include cognitive, depression, and falls Referrals and appointments  In addition, I have reviewed and discussed with patient certain preventive protocols, quality metrics, and best practice recommendations. A written personalized care plan for preventive services as well as general preventive health recommendations were provided to patient.   Due to this being a telephonic visit, the after visit summary with patients personalized plan was offered to patient via mail or my-chart. Patient would like to access on my-chart.  Donne Anon, [place]   12/09/2021   Nurse Notes: None

## 2021-12-12 ENCOUNTER — Other Ambulatory Visit: Payer: Self-pay | Admitting: Family Medicine

## 2021-12-12 DIAGNOSIS — Z760 Encounter for issue of repeat prescription: Secondary | ICD-10-CM

## 2022-01-05 NOTE — Progress Notes (Signed)
[facility]                                     Cardiology Office Note:    Date:  01/06/2022   ID:  Kristy Arias, DOB 1950-05-06, MRN [id number]  PCP:  Kristy Cables, MD  [facility] Cardiologist:  Dr. Rosemary Holms, MD  [facility] Electrophysiologist:  Kristy Prude, MD   Referring MD: Kristy Cables, MD   Chief Complaint  Patient presents with   Follow-up    Pre LAAO    History of Present Illness:    Kristy Arias is a 70 y.o. female with a hx of HTN, HLD, hypothyroidism, and atrial fibrillation who presents for pre LAAO evaluation prior to procedure scheduled 02/04/22.  Ms. Kristy Arias has been followed by the [facility] and Dr. Rosemary Arias for her cardiology care. She was initially seen by Dr. Lalla Arias due to increased frequency of AF. She underwent atrial fibrillation ablation 10/23/21. Since that time she has been feeling well although on last evaluation although she "possible AF" notifications on her Kardia mobile device. Strips were reviewed by the AF Clinic which showed intermittent AF and NSR with PACs although he was noted to be back in SR on last follow up. She has significant arthritis which is typically controlled with NSAIDs however due to the need for anticoagulation, she has been unable to take this and wishes to proceed with Watchman in hopes to avoid long term AC.   Today she is here alone and reports she has been well and is ready for her procedure. She denies chest pain, palpitations, LE edema, orthopnea, dizziness, bleeding in stool or urine, or syncope.   Past Medical History:  Diagnosis Date   Anemia    Anxiety    Arthritis    Depression    GERD (gastroesophageal reflux disease)    Glaucoma    Headache    Heart murmur    Hyperlipidemia    Hypertension    Hypothyroidism    Shortness of breath    syndrome x   Syndrome X, cardiac     Past Surgical History:  Procedure Laterality Date   ABDOMINAL HYSTERECTOMY      ATRIAL FIBRILLATION ABLATION N/A 10/23/2021   Procedure: ATRIAL FIBRILLATION ABLATION;  Surgeon: Kristy Prude, MD;  Location: [facility] IN[facility];  Service: Cardiovascular;  Laterality: N/A;   BREAST ENHANCEMENT SURGERY     BREAST SURGERY     CARDIAC CATHETERIZATION  4/14   CHOLECYSTECTOMY     COSMETIC SURGERY     DILATION AND CURETTAGE OF UTERUS  2013   EYE SURGERY     LAPAROSCOPIC CHOLECYSTECTOMY SINGLE PORT N/A 06/29/2012   Procedure: LAPAROSCOPIC CHOLECYSTECTOMY SINGLE PORT IOC ;  Surgeon: Ardeth Sportsman, MD;  Location: [facility] OR[facility];  Service: General;  Laterality: N/A;   LEFT HEART CATHETERIZATION WITH CORONARY ANGIOGRAM N/A 05/23/2012   Procedure: LEFT HEART CATHETERIZATION WITH CORONARY ANGIOGRAM;  Surgeon: [name], MD;  Location: [facility] CATH LAB;  Service: Cardiovascular;  Laterality: N/A;   TOTAL KNEE ARTHROPLASTY Right 08/25/2015   Procedure: RIGHT TOTAL KNEE ARTHROPLASTY;  Surgeon: Ollen Gross, MD;  Location: [facility] OR[facility];  Service: Orthopedics;  Laterality: Right;   TOTAL KNEE ARTHROPLASTY Left 08/09/2016   Procedure: LEFT TOTAL KNEE ARTHROPLASTY;  Surgeon: Ollen Gross, MD;  Location: [facility] ORS;  Service: Orthopedics;  Laterality: Left;  Current Medications: Current Meds  Medication Sig   ALPRAZolam (XANAX) 0.5 MG tablet TAKE 1 TABLET BY MOUTH 2 TIMES DAILY AS NEEDED FOR anxiety - GREENSTONE BRAND ONLY   apixaban (ELIQUIS) 5 MG TABS tablet Take 1 tablet (5 mg total) by mouth 2 (two) times daily.   Biotin w/ Vitamins C & E (HAIR/SKIN/NAILS PO) Take 1 tablet by mouth in the morning.   butalbital-acetaminophen-caffeine (FIORICET) 50-325-40 MG tablet TAKE 1 TABLET BY MOUTH 2 TIMES DAILY AS NEEDED   calcium carbonate (TUMS - DOSED IN MG ELEMENTAL CALCIUM) 500 MG chewable tablet Chew 1 tablet by mouth 2 (two) times daily as needed for heartburn or indigestion.   CANNABIDIOL PO Take 1 tablet by mouth at bedtime as needed (sleep). Sleep Gummy with Delta 8 THC   cetirizine (ZYRTEC) 10  MG tablet Take 10 mg by mouth daily as needed for allergies.   Cyanocobalamin (B-12) 3000 MCG SUBL Take 3,000 mcg by mouth in the morning.   diltiazem (CARDIZEM CD) 240 MG 24 hr capsule Take 1 capsule (240 mg total) by mouth daily.   estradiol (VIVELLE-DOT) 0.1 MG/24HR patch Place 1 patch onto the skin 2 (two) times a week. Wednesdays & Saturdays.   ezetimibe (ZETIA) 10 MG tablet TAKE 1 TABLET BY MOUTH EVERY DAY   levothyroxine (SYNTHROID) 50 MCG tablet Take 1 tablet (50 mcg total) by mouth daily before breakfast.   losartan (COZAAR) 50 MG tablet TAKE 1 TABLET BY MOUTH ONCE DAILY   rosuvastatin (CRESTOR) 5 MG tablet TAKE 1 TABLET BY MOUTH 3 TIMES WEEKLY   venlafaxine XR (EFFEXOR-XR) 37.5 MG 24 hr capsule TAKE 2 CAPSULES BY MOUTH daily WITH BREAKFAST   vitamin D3 (CHOLECALCIFEROL) 25 MCG tablet Take 1,000 Units by mouth in the morning.     Allergies:   Patient has no known allergies.   Social History   Socioeconomic History   Marital status: Married    Spouse name: Not on file   Number of children: 2   Years of education: 17   Highest education level: Not on file  Occupational History   Not on file  Tobacco Use   Smoking status: Never   Smokeless tobacco: Never   Tobacco comments:    Never smoke 11/20/21  Vaping Use   Vaping Use: Never used  Substance and Sexual Activity   Alcohol use: No   Drug use: No   Sexual activity: Yes    Birth control/protection: Surgical  Other Topics Concern   Not on file  Social History Narrative   Right handed   Drinks caffeine prn   Two story home   Social Determinants of Health   Financial Resource Strain: Low Risk  (12/09/2021)   Overall Financial Resource Strain (CARDIA)    Difficulty of Paying Living Expenses: Not hard at all  Food Insecurity: No Food Insecurity (12/09/2021)   Hunger Vital Sign    Worried About Running Out of Food in the Last Year: Never true    Ran Out of Food in the Last Year: Never true  Transportation Needs: No  Transportation Needs (12/09/2021)   PRAPARE - Administrator, Civil Service (Medical): No    Lack of Transportation (Non-Medical): No  Physical Activity: Inactive (12/09/2021)   Exercise Vital Sign    Days of Exercise per Week: 0 days    Minutes of Exercise per Session: 0 min  Stress: No Stress Concern Present (12/09/2021)   [employer] of Occupational Health - Occupational Stress Questionnaire  Feeling of Stress : Not at all  Social Connections: Not on file     Family History: The patient's family history includes Cancer in her mother and sister; Diabetes in her father and maternal grandfather; Heart disease in her father, maternal grandfather, maternal grandmother, paternal grandfather, and paternal grandmother; Hyperlipidemia in her father, maternal grandfather, and mother; Hypertension in her father, maternal grandmother, and mother; Osteoporosis in her paternal grandmother.  ROS:   Please see the history of present illness.    All other systems reviewed and are negative.  EKGs/Labs/Other Studies Reviewed:    The following studies were reviewed today:  CT cardiac pulm/morph 10/19/21:  1. The left atrial appendage is a large windsock morphology without thrombus.   2. A 27 mm Watchman FLX device is recommended based on the above landing zone measurements (21.4 mm maximum diameter; 21% compression).   3. A mid/mid IAS puncture site is recommended. Small LA noted which will make TEE imaging difficult.   5. Optimal deployment angle: RAO 13 CRA 8   6. CAC score of 0.   7. Normal pulmonary venous drainage. Right top pulmonary vein noted. Small accessory LAA noted as well.   EKG:  EKG is ordered today.  The ekg ordered today demonstrates NSR  Recent Labs: 03/25/2021: ALT 16; TSH 2.92 07/11/2021: Magnesium 1.5 10/02/2021: BUN 13; Creatinine, Ser 0.91; Hemoglobin 14.2; Platelets 291; Potassium 4.0; Sodium 141   Recent Lipid Panel    Component Value  Date/Time   CHOL 180 03/25/2021 1116   TRIG 109.0 03/25/2021 1116   HDL 71.20 03/25/2021 1116   CHOLHDL 3 03/25/2021 1116   VLDL 21.8 03/25/2021 1116   LDLCALC 87 03/25/2021 1116   Risk Assessment/Calculations:    HAS-BLED score 2 Hypertension No  Abnormal renal and liver function (Dialysis, transplant, Cr >2.26 mg/dL /Cirrhosis or Bilirubin >2x Normal or AST/ALT/AP >3x Normal) No  Stroke No  Bleeding No  Labile INR (Unstable/high INR) No  Elderly (>65) Yes  Drugs or alcohol (? 8 drinks/week, anti-plt or NSAID) Yes (wishes to take NSAIDs for arthritis pain)   CHA2DS2-VASc Score = 3  The patient's score is based upon: CHF History: 0 HTN History: 1 Diabetes History: 0 Stroke History: 0 Vascular Disease History: 0 Age Score: 1 Gender Score: 1   Physical Exam:    VS:  BP 124/78 (BP Location: Left Arm, Patient Position: Sitting, Cuff Size: Normal)   Pulse 73   Ht 5' 1.5" (1.562 m)   Wt 121 lb 6.4 oz (55.1 kg)   SpO2 96%   BMI 22.57 kg/m     Wt Readings from Last 3 Encounters:  01/06/22 121 lb 6.4 oz (55.1 kg)  11/20/21 121 lb (54.9 kg)  10/23/21 120 lb (54.4 kg)    General: Well developed, well nourished, NAD Lungs:Clear to ausculation bilaterally. No wheezes, rales, or rhonchi. Breathing is unlabored. Cardiovascular: RRR with S1 S2. No murmurs Extremities: No edema. Neuro: Alert and oriented. No focal deficits. No facial asymmetry. MAE spontaneously. Psych: Responds to questions appropriately with normal affect.    ASSESSMENT/PLAN:    Paroxysmal Atrial Fibrillation: She is now s/p AF ablation 09/2021 and is now wishing to proceed with Watchman implant to avoid long term anticoagulation given the need for NSAIDs to control her significant arthritis pain. She is scheduled 02/04/22 with Dr. Lalla Arias. Pre procedure instruction reviewed with all questions answered. CHG soap given. Obtain BMET, CBC today.     HTN: Stable with no changes needed today  Medication  Adjustments/Labs and Tests Ordered: Current medicines are reviewed at length with the patient today.  Concerns regarding medicines are outlined above.  Orders Placed This Encounter  Procedures   Basic metabolic panel   CBC   No orders of the defined types were placed in this encounter.   Patient Instructions  Medication Instructions:  NO CHANGES  *If you need a refill on your cardiac medications before your next appointment, please call your pharmacy*   Lab Work: TODAY BMET AND CBC  If you have labs (blood work) drawn today and your tests are completely normal, you will receive your results only by: MyChart Message (if you have MyChart) OR A paper copy in the mail If you have any lab test that is abnormal or we need to change your treatment, we will call you to review the results.   Testing/Procedures: WATCHMAN SEE INSTRUCTIONS SHEET    Follow-Up: At [facility], you and your health needs are our priority.  As part of our continuing mission to provide you with exceptional heart care, we have created designated Provider Care Teams.  These Care Teams include your primary Cardiologist (physician) and Advanced Practice Providers (APPs -  Physician Assistants and Nurse Practitioners) who all work together to provide you with the care you need, when you need it.  We recommend signing up for the patient portal called "MyChart".  Sign up information is provided on this After Visit Summary.  MyChart is used to connect with patients for Virtual Visits (Telemedicine).  Patients are able to view lab/test results, encounter notes, upcoming appointments, etc.  Non-urgent messages can be sent to your provider as well.   To learn more about what you can do with MyChart, go to [email or web address].    Your next appointment:   WILL PLAN  FOLLOW UP AT HOSPITAL AFTER PROCEDURE DONE  The format for your next appointment:     Provider:       Other Instructions   Important  Information About Sugar         Signed, Georgie Chard, NP  01/06/2022 2:25 PM    [facility]

## 2022-01-06 ENCOUNTER — Ambulatory Visit: Payer: Medicare Other | Attending: Cardiovascular Disease | Admitting: Cardiology

## 2022-01-06 VITALS — BP 124/78 | HR 73 | Ht 61.5 in | Wt 121.4 lb

## 2022-01-06 DIAGNOSIS — I10 Essential (primary) hypertension: Secondary | ICD-10-CM | POA: Diagnosis not present

## 2022-01-06 DIAGNOSIS — I4891 Unspecified atrial fibrillation: Secondary | ICD-10-CM

## 2022-01-06 DIAGNOSIS — M179 Osteoarthritis of knee, unspecified: Secondary | ICD-10-CM | POA: Diagnosis not present

## 2022-01-06 DIAGNOSIS — Z01818 Encounter for other preprocedural examination: Secondary | ICD-10-CM

## 2022-01-06 DIAGNOSIS — Z01812 Encounter for preprocedural laboratory examination: Secondary | ICD-10-CM | POA: Diagnosis not present

## 2022-01-06 NOTE — Patient Instructions (Signed)
Medication Instructions:  NO CHANGES  *If you need a refill on your cardiac medications before your next appointment, please call your pharmacy*   Lab Work: TODAY BMET AND CBC  If you have labs (blood work) drawn today and your tests are completely normal, you will receive your results only by: MyChart Message (if you have MyChart) OR A paper copy in the mail If you have any lab test that is abnormal or we need to change your treatment, we will call you to review the results.   Testing/Procedures: WATCHMAN SEE INSTRUCTIONS SHEET    Follow-Up: At [facility], you and your health needs are our priority.  As part of our continuing mission to provide you with exceptional heart care, we have created designated Provider Care Teams.  These Care Teams include your primary Cardiologist (physician) and Advanced Practice Providers (APPs -  Physician Assistants and Nurse Practitioners) who all work together to provide you with the care you need, when you need it.  We recommend signing up for the patient portal called "MyChart".  Sign up information is provided on this After Visit Summary.  MyChart is used to connect with patients for Virtual Visits (Telemedicine).  Patients are able to view lab/test results, encounter notes, upcoming appointments, etc.  Non-urgent messages can be sent to your provider as well.   To learn more about what you can do with MyChart, go to [email or web address].    Your next appointment:   WILL PLAN  FOLLOW UP AT HOSPITAL AFTER PROCEDURE DONE  The format for your next appointment:     Provider:       Other Instructions   Important Information About Sugar

## 2022-01-07 LAB — CBC
Hematocrit: 42.7 % (ref 34.0–46.6)
Hemoglobin: 14.4 g/dL (ref 11.1–15.9)
MCH: 32.1 pg (ref 26.6–33.0)
MCHC: 33.7 g/dL (ref 31.5–35.7)
MCV: 95 fL (ref 79–97)
Platelets: 295 10*3/uL (ref 150–450)
RBC: 4.48 x10E6/uL (ref 3.77–5.28)
RDW: 11.9 % (ref 11.7–15.4)
WBC: 6.1 10*3/uL (ref 3.4–10.8)

## 2022-01-07 LAB — BASIC METABOLIC PANEL
BUN/Creatinine Ratio: 14 (ref 12–28)
BUN: 11 mg/dL (ref 8–27)
CO2: 25 mmol/L (ref 20–29)
Calcium: 9.4 mg/dL (ref 8.7–10.3)
Chloride: 102 mmol/L (ref 96–106)
Creatinine, Ser: 0.81 mg/dL (ref 0.57–1.00)
Glucose: 101 mg/dL — ABNORMAL HIGH (ref 70–99)
Potassium: 4.5 mmol/L (ref 3.5–5.2)
Sodium: 139 mmol/L (ref 134–144)
eGFR: 79 mL/min/{1.73_m2} (ref >59)

## 2022-01-07 NOTE — Addendum Note (Signed)
Addended by: Daleen Bo I on: 01/07/2022 04:23 PM   Modules accepted: Orders

## 2022-01-11 ENCOUNTER — Other Ambulatory Visit: Payer: Self-pay | Admitting: Family Medicine

## 2022-01-11 ENCOUNTER — Other Ambulatory Visit: Payer: Self-pay | Admitting: Internal Medicine

## 2022-01-11 DIAGNOSIS — E039 Hypothyroidism, unspecified: Secondary | ICD-10-CM

## 2022-01-22 ENCOUNTER — Other Ambulatory Visit: Payer: Self-pay | Admitting: Family Medicine

## 2022-01-22 ENCOUNTER — Other Ambulatory Visit: Payer: Self-pay

## 2022-01-22 DIAGNOSIS — F341 Dysthymic disorder: Secondary | ICD-10-CM

## 2022-02-03 ENCOUNTER — Telehealth: Payer: Self-pay

## 2022-02-03 NOTE — Telephone Encounter (Signed)
Confirmed procedure date of 02/04/2022. Confirmed arrival time of 0530 for procedure time at 0730. Reviewed pre-procedure instructions with patient. The patient understands to call if questions/concerns arise prior to procedure.

## 2022-02-03 NOTE — Telephone Encounter (Signed)
Left message to call back  

## 2022-02-04 ENCOUNTER — Inpatient Hospital Stay (HOSPITAL_COMMUNITY): Payer: Medicare Other | Admitting: Certified Registered Nurse Anesthetist

## 2022-02-04 ENCOUNTER — Inpatient Hospital Stay (HOSPITAL_COMMUNITY)
Admission: RE | Admit: 2022-02-04 | Discharge: 2022-02-04 | DRG: 274 | Disposition: A | Discharge status: Home / Self Care | Payer: Medicare Other | Source: Ambulatory Visit | Attending: Cardiology | Admitting: Cardiology

## 2022-02-04 ENCOUNTER — Encounter (HOSPITAL_COMMUNITY): Payer: Self-pay | Admitting: Cardiology

## 2022-02-04 ENCOUNTER — Encounter (HOSPITAL_COMMUNITY)
Admission: RE | Disposition: A | Discharge status: Home / Self Care | Payer: Self-pay | Source: Ambulatory Visit | Attending: Cardiology

## 2022-02-04 ENCOUNTER — Inpatient Hospital Stay (HOSPITAL_COMMUNITY): Payer: Medicare Other

## 2022-02-04 ENCOUNTER — Other Ambulatory Visit: Payer: Self-pay | Admitting: Physician Assistant

## 2022-02-04 DIAGNOSIS — I480 Paroxysmal atrial fibrillation: Principal | ICD-10-CM | POA: Diagnosis present

## 2022-02-04 DIAGNOSIS — Z95818 Presence of other cardiac implants and grafts: Secondary | ICD-10-CM

## 2022-02-04 DIAGNOSIS — Z8249 Family history of ischemic heart disease and other diseases of the circulatory system: Secondary | ICD-10-CM

## 2022-02-04 DIAGNOSIS — M179 Osteoarthritis of knee, unspecified: Secondary | ICD-10-CM

## 2022-02-04 DIAGNOSIS — I4891 Unspecified atrial fibrillation: Secondary | ICD-10-CM | POA: Diagnosis present

## 2022-02-04 DIAGNOSIS — E785 Hyperlipidemia, unspecified: Secondary | ICD-10-CM | POA: Diagnosis present

## 2022-02-04 DIAGNOSIS — F418 Other specified anxiety disorders: Secondary | ICD-10-CM

## 2022-02-04 DIAGNOSIS — K219 Gastro-esophageal reflux disease without esophagitis: Secondary | ICD-10-CM | POA: Diagnosis present

## 2022-02-04 DIAGNOSIS — M1990 Unspecified osteoarthritis, unspecified site: Secondary | ICD-10-CM | POA: Diagnosis present

## 2022-02-04 DIAGNOSIS — Z7901 Long term (current) use of anticoagulants: Secondary | ICD-10-CM

## 2022-02-04 DIAGNOSIS — Z006 Encounter for examination for normal comparison and control in clinical research program: Secondary | ICD-10-CM

## 2022-02-04 DIAGNOSIS — Z90710 Acquired absence of both cervix and uterus: Secondary | ICD-10-CM

## 2022-02-04 DIAGNOSIS — E039 Hypothyroidism, unspecified: Secondary | ICD-10-CM | POA: Diagnosis present

## 2022-02-04 DIAGNOSIS — I3139 Other pericardial effusion (noninflammatory): Secondary | ICD-10-CM | POA: Diagnosis present

## 2022-02-04 DIAGNOSIS — Z79899 Other long term (current) drug therapy: Secondary | ICD-10-CM

## 2022-02-04 DIAGNOSIS — I209 Angina pectoris, unspecified: Secondary | ICD-10-CM | POA: Diagnosis present

## 2022-02-04 DIAGNOSIS — Z79890 Hormone replacement therapy (postmenopausal): Secondary | ICD-10-CM

## 2022-02-04 DIAGNOSIS — I10 Essential (primary) hypertension: Secondary | ICD-10-CM

## 2022-02-04 DIAGNOSIS — Z96653 Presence of artificial knee joint, bilateral: Secondary | ICD-10-CM | POA: Diagnosis present

## 2022-02-04 DIAGNOSIS — I773 Arterial fibromuscular dysplasia: Secondary | ICD-10-CM | POA: Diagnosis present

## 2022-02-04 DIAGNOSIS — F32A Depression, unspecified: Secondary | ICD-10-CM | POA: Diagnosis present

## 2022-02-04 DIAGNOSIS — G8929 Other chronic pain: Secondary | ICD-10-CM | POA: Diagnosis present

## 2022-02-04 DIAGNOSIS — Z9049 Acquired absence of other specified parts of digestive tract: Secondary | ICD-10-CM

## 2022-02-04 DIAGNOSIS — D6869 Other thrombophilia: Secondary | ICD-10-CM | POA: Diagnosis present

## 2022-02-04 DIAGNOSIS — F419 Anxiety disorder, unspecified: Secondary | ICD-10-CM | POA: Diagnosis present

## 2022-02-04 HISTORY — PX: TEE WITHOUT CARDIOVERSION: SHX5443

## 2022-02-04 HISTORY — DX: Presence of other cardiac implants and grafts: Z95.818

## 2022-02-04 HISTORY — PX: LEFT ATRIAL APPENDAGE OCCLUSION: EP1229

## 2022-02-04 LAB — TYPE AND SCREEN
ABO/RH(D): O POS
Antibody Screen: NEGATIVE

## 2022-02-04 LAB — POCT ACTIVATED CLOTTING TIME: Activated Clotting Time: 320 seconds

## 2022-02-04 LAB — ECHO TEE

## 2022-02-04 LAB — SURGICAL PCR SCREEN
MRSA, PCR: NEGATIVE
Staphylococcus aureus: NEGATIVE

## 2022-02-04 SURGERY — LEFT ATRIAL APPENDAGE OCCLUSION
Anesthesia: General

## 2022-02-04 MED ORDER — HEPARIN (PORCINE) IN NACL 1000-0.9 UT/500ML-% IV SOLN
INTRAVENOUS | Status: AC
  Filled 2022-02-04: qty 500

## 2022-02-04 MED ORDER — MIDAZOLAM HCL 2 MG/2ML IJ SOLN
INTRAMUSCULAR | Status: AC
  Filled 2022-02-04: qty 2

## 2022-02-04 MED ORDER — LIDOCAINE 2% (20 MG/ML) 5 ML SYRINGE
INTRAMUSCULAR | Status: DC | PRN
  Administered 2022-02-04: 60 mg via INTRAVENOUS

## 2022-02-04 MED ORDER — HEPARIN SODIUM (PORCINE) 1000 UNIT/ML IJ SOLN
INTRAMUSCULAR | Status: DC | PRN
  Administered 2022-02-04: 8000 [IU] via INTRAVENOUS

## 2022-02-04 MED ORDER — DEXAMETHASONE SODIUM PHOSPHATE 10 MG/ML IJ SOLN
INTRAMUSCULAR | Status: DC | PRN
  Administered 2022-02-04: 5 mg via INTRAVENOUS

## 2022-02-04 MED ORDER — ONDANSETRON HCL 4 MG/2ML IJ SOLN
INTRAMUSCULAR | Status: DC | PRN
  Administered 2022-02-04: 4 mg via INTRAVENOUS

## 2022-02-04 MED ORDER — ACETAMINOPHEN 325 MG PO TABS: 650.0000 mg | ORAL_TABLET | ORAL | Status: DC | PRN

## 2022-02-04 MED ORDER — IOHEXOL 350 MG/ML SOLN
INTRAVENOUS | Status: DC | PRN
  Administered 2022-02-04 (×2): 5 mL

## 2022-02-04 MED ORDER — SODIUM CHLORIDE 0.9 % IV SOLN: INTRAVENOUS | Status: DC

## 2022-02-04 MED ORDER — FENTANYL CITRATE (PF) 100 MCG/2ML IJ SOLN
INTRAMUSCULAR | Status: DC | PRN
  Administered 2022-02-04: 50 ug via INTRAVENOUS

## 2022-02-04 MED ORDER — PROPOFOL 10 MG/ML IV BOLUS
INTRAVENOUS | Status: DC | PRN
  Administered 2022-02-04: 100 mg via INTRAVENOUS

## 2022-02-04 MED ORDER — SUGAMMADEX SODIUM 200 MG/2ML IV SOLN
INTRAVENOUS | Status: DC | PRN
  Administered 2022-02-04: 200 mg via INTRAVENOUS

## 2022-02-04 MED ORDER — CHLORHEXIDINE GLUCONATE 0.12 % MT SOLN: 15.0000 mL | OROMUCOSAL | Status: AC

## 2022-02-04 MED ORDER — ASPIRIN 81 MG PO TBEC: 81.0000 mg | DELAYED_RELEASE_TABLET | Freq: Every day | ORAL | 0 refills | Status: AC

## 2022-02-04 MED ORDER — SODIUM CHLORIDE 0.9% FLUSH: 3.0000 mL | INTRAVENOUS | Status: DC | PRN

## 2022-02-04 MED ORDER — SODIUM CHLORIDE 0.9% FLUSH: 3.0000 mL | Freq: Two times a day (BID) | INTRAVENOUS | Status: DC

## 2022-02-04 MED ORDER — PHENYLEPHRINE HCL-NACL 20-0.9 MG/250ML-% IV SOLN
INTRAVENOUS | Status: DC | PRN
  Administered 2022-02-04: 30 ug/min via INTRAVENOUS

## 2022-02-04 MED ORDER — ROCURONIUM BROMIDE 10 MG/ML (PF) SYRINGE
PREFILLED_SYRINGE | INTRAVENOUS | Status: DC | PRN
  Administered 2022-02-04: 20 mg via INTRAVENOUS
  Administered 2022-02-04: 40 mg via INTRAVENOUS

## 2022-02-04 MED ORDER — PROTAMINE SULFATE 10 MG/ML IV SOLN
INTRAVENOUS | Status: DC | PRN
  Administered 2022-02-04: 30 mg via INTRAVENOUS

## 2022-02-04 MED ORDER — ONDANSETRON HCL 4 MG/2ML IJ SOLN: 4.0000 mg | Freq: Four times a day (QID) | INTRAMUSCULAR | Status: DC | PRN

## 2022-02-04 MED ORDER — HEPARIN (PORCINE) IN NACL 2000-0.9 UNIT/L-% IV SOLN
INTRAVENOUS | Status: AC
  Filled 2022-02-04: qty 1000

## 2022-02-04 MED ORDER — CEFAZOLIN SODIUM-DEXTROSE 2-4 GM/100ML-% IV SOLN
2.0000 g | INTRAVENOUS | Status: AC
  Administered 2022-02-04: 2 g via INTRAVENOUS
  Filled 2022-02-04: qty 100

## 2022-02-04 MED ORDER — HEPARIN (PORCINE) IN NACL 2000-0.9 UNIT/L-% IV SOLN
INTRAVENOUS | Status: DC | PRN
  Administered 2022-02-04: 1000 mL

## 2022-02-04 MED ORDER — MIDAZOLAM HCL 2 MG/2ML IJ SOLN
INTRAMUSCULAR | Status: DC | PRN
  Administered 2022-02-04: 2 mg via INTRAVENOUS

## 2022-02-04 MED ORDER — FENTANYL CITRATE (PF) 100 MCG/2ML IJ SOLN
INTRAMUSCULAR | Status: AC
  Filled 2022-02-04: qty 2

## 2022-02-04 MED ORDER — HEPARIN (PORCINE) IN NACL 1000-0.9 UT/500ML-% IV SOLN
INTRAVENOUS | Status: DC | PRN
  Administered 2022-02-04: 500 mL

## 2022-02-04 MED ORDER — CHLORHEXIDINE GLUCONATE 4 % EX LIQD: Freq: Once | CUTANEOUS | Status: DC

## 2022-02-04 MED ORDER — SODIUM CHLORIDE 0.9 % IV SOLN: 250.0000 mL | INTRAVENOUS | Status: DC | PRN

## 2022-02-04 MED ORDER — CHLORHEXIDINE GLUCONATE 0.12 % MT SOLN
OROMUCOSAL | Status: AC
  Administered 2022-02-04: 15 mL via OROMUCOSAL
  Filled 2022-02-04: qty 15

## 2022-02-04 SURGICAL SUPPLY — 19 items
BLANKET WARM UNDERBOD FULL ACC (MISCELLANEOUS) ×1 IMPLANT
CATH INFINITI 5FR ANG PIGTAIL (CATHETERS) IMPLANT
CLOSURE PERCLOSE PROSTYLE (VASCULAR PRODUCTS) IMPLANT
DEVICE WATCHMAN FLX PROC (KITS) IMPLANT
DILATOR VESSEL 38 20CM 11FR (INTRODUCER) IMPLANT
KIT HEART LEFT (KITS) ×1 IMPLANT
KIT SHEA VERSACROSS LAAC CONNE (KITS) IMPLANT
PACK CARDIAC CATHETERIZATION (CUSTOM PROCEDURE TRAY) ×1 IMPLANT
PAD DEFIB RADIO PHYSIO CONN (PAD) ×1 IMPLANT
SHEATH PERFORMER 16FR 30 (SHEATH) IMPLANT
SHEATH PINNACLE 8F 10CM (SHEATH) IMPLANT
SHEATH PROBE COVER 6X72 (BAG) ×1 IMPLANT
SYS WATCHMAN FXD DBL (SHEATH) ×1
SYSTEM WATCHMAN FXD DBL (SHEATH) IMPLANT
TRANSDUCER W/STOPCOCK (MISCELLANEOUS) ×1 IMPLANT
TUBING CIL FLEX 10 FLL-RA (TUBING) ×1 IMPLANT
WATCHMAN FLX 27 (Prosthesis & Implant Heart) IMPLANT
WATCHMAN FLX PROCEDURE DEVICE (KITS) ×1 IMPLANT
WATCHMAN PROCED TRUSEAL ACCESS (SHEATH) IMPLANT

## 2022-02-04 NOTE — Anesthesia Postprocedure Evaluation (Signed)
Anesthesia Post Note  Patient: Kristy Arias  Procedure(s) Performed: LEFT ATRIAL APPENDAGE OCCLUSION TRANSESOPHAGEAL ECHOCARDIOGRAM (TEE)     Patient location during evaluation: PACU Anesthesia Type: General Level of consciousness: awake Pain management: pain level controlled Vital Signs Assessment: post-procedure vital signs reviewed and stable Respiratory status: spontaneous breathing, nonlabored ventilation and respiratory function stable Cardiovascular status: blood pressure returned to baseline and stable Postop Assessment: no apparent nausea or vomiting Anesthetic complications: no   No notable events documented.  Last Vitals:  Vitals:   02/04/22 1300 02/04/22 1400  BP: 126/62 123/60  Pulse: 64 67  Resp: 16 17  Temp:    SpO2: 94% 95%    Last Pain:  Vitals:   02/04/22 1100  TempSrc:   PainSc: 0-No pain                 Kristy Arias

## 2022-02-04 NOTE — Transfer of Care (Signed)
Immediate Anesthesia Transfer of Care Note  Patient: Kristy Arias  Procedure(s) Performed: LEFT ATRIAL APPENDAGE OCCLUSION TRANSESOPHAGEAL ECHOCARDIOGRAM (TEE)  Patient Location: PACU and Cath Lab  Anesthesia Type:General  Level of Consciousness: awake and alert   Airway & Oxygen Therapy: Patient Spontanous Breathing and Patient connected to nasal cannula oxygen  Post-op Assessment: Report given to RN and Post -op Vital signs reviewed and stable  Post vital signs: Reviewed and stable  Last Vitals:  Vitals Value Taken Time  BP 141/70 02/04/22 0926  Temp    Pulse 64 02/04/22 0931  Resp 16 02/04/22 0931  SpO2 92 % 02/04/22 0931  Vitals shown include unvalidated device data.  Last Pain:  Vitals:   02/04/22 0610  TempSrc:   PainSc: 0-No pain      Patients Stated Pain Goal: 0 (18/56/31 4970)  Complications: No notable events documented.

## 2022-02-04 NOTE — Discharge Instructions (Addendum)
[facility] Procedure, Care After  Procedure MD: Dr. Benson Norway Clinical CoorDr. Lambertdinator: Lenice Llamas, RN  This sheet gives you information about how to care for yourself after your procedure. Your health care provider may also give you more specific instructions. If you have problems or questions, contact your health care provider.  What can I expect after the procedure? After the procedure, it is common to have: Bruising around your puncture site. Tenderness around your puncture site. Tiredness (fatigue).  Medication instructions It is very important to continue to take your blood thinner as directed by your doctor after the Watchman procedure. Call your procedure doctor's office with question or concerns. If you are on Coumadin (warfarin), you will have your INR checked the week after your procedure, with a goal INR of 2.0 - 3.0. Please follow your medication instructions on your discharge summary. Only take the medications listed on your discharge paperwork.  Follow up You will be seen in 1 month after your procedure You will have a repeat CT scan approximately 8 weeks after your procedure mark to check your device You will follow up the MD/APP who performed your procedure 6 months after your procedure The Watchman Clinical Coordinator will check in with you from time to time, including 1 and 2 years after your procedure.    Follow these instructions at home: Puncture site care  Follow instructions from your health care provider about how to take care of your puncture site. Make sure you: If present, leave stitches (sutures), skin glue, or adhesive strips in place.  If a large square bandage is present, this may be removed 24 hours after surgery.  Check your puncture site every day for signs of infection. Check for: Redness, swelling, or pain. Fluid or blood. If your puncture site starts to bleed, lie down on your back, apply firm pressure to the area, and contact your  health care provider. Warmth. Pus or a bad smell. Driving Do not drive yourself home if you received sedation Do not drive for at least 4 days after your procedure or however long your health care provider recommends. (Do not resume driving if you have previously been instructed not to drive for other health reasons.) Do not spend greater than 1 hour at a time in a car for the first 3 days. Stop and take a break with a 5 minute walk at least every hour.  Do not drive or use heavy machinery while taking prescription pain medicine.  Activity Avoid activities that take a lot of effort, including exercise, for at least 7 days after your procedure. For the first 3 days, avoid sitting for longer than one hour at a time.  Avoid alcoholic beverages, signing paperwork, or participating in legal proceedings for 24 hours after receiving sedation Do not lift anything that is heavier than 10 lb (4.5 kg) for one week.  No sexual activity for 1 week.  Return to your normal activities as told by your health care provider. Ask your health care provider what activities are safe for you. General instructions Take over-the-counter and prescription medicines only as told by your health care provider. Do not use any products that contain nicotine or tobacco, such as cigarettes and e-cigarettes. If you need help quitting, ask your health care provider. You may shower after 24 hours, but Do not take baths, swim, or use a hot tub for 1 week.  Do not drink alcohol for 24 hours after your procedure. Keep all follow-up visits as told by  your health care provider. This is important. Dental Work: You will require antibiotics prior to any dental work, including cleanings, for 6 months after your Watchman implantation to help protect you from infection. After 6 months, antibiotics are no longer required. Contact a health care provider if: You have redness, mild swelling, or pain around your puncture site. You have soreness  in your throat or at your puncture site that does not improve after several days You have fluid or blood coming from your puncture site that stops after applying firm pressure to the area. Your puncture site feels warm to the touch. You have pus or a bad smell coming from your puncture site. You have a fever. You have chest pain or discomfort that spreads to your neck, jaw, or arm. You are sweating a lot. You feel nauseous. You have a fast or irregular heartbeat. You have shortness of breath. You are dizzy or light-headed and feel the need to lie down. You have pain or numbness in the arm or leg closest to your puncture site. Get help right away if: Your puncture site suddenly swells. Your puncture site is bleeding and the bleeding does not stop after applying firm pressure to the area. These symptoms may represent a serious problem that is an emergency. Do not wait to see if the symptoms will go away. Get medical help right away. Call your local emergency services (911 in the [place] Do not drive yourself to the hospital. Summary After the procedure, it is normal to have bruising and tenderness at the puncture site in your groin, neck, or forearm. Check your puncture site every day for signs of infection. Get help right away if your puncture site is bleeding and the bleeding does not stop after applying firm pressure to the area. This is a medical emergency.  This information is not intended to replace advice given to you by your health care provider. Make sure you discuss any questions you have with your health care provider.

## 2022-02-04 NOTE — Progress Notes (Signed)
Order to discharge pt home.  Discharge instructions/AVS given to patient and reviewed - education provided as needed.  Pt advised to call PCP and/or come back to the hospital if there are any problems. Pt verbalized understanding.    

## 2022-02-04 NOTE — Anesthesia Procedure Notes (Signed)
Procedure Name: Intubation Date/Time: 02/04/2022 8:00 AM  Performed by: Inda Coke, CRNAPre-anesthesia Checklist: Patient identified, Emergency Drugs available, Suction available, Timeout performed and Patient being monitored Patient Re-evaluated:Patient Re-evaluated prior to induction Oxygen Delivery Method: Circle system utilized Preoxygenation: Pre-oxygenation with 100% oxygen Induction Type: IV induction Ventilation: Mask ventilation without difficulty Laryngoscope Size: Mac and 3 Grade View: Grade I Tube type: Oral Tube size: 7.5 mm Number of attempts: 1 Airway Equipment and Method: Stylet Placement Confirmation: ETT inserted through vocal cords under direct vision, positive ETCO2, CO2 detector and breath sounds checked- equal and bilateral Secured at: 21 cm Tube secured with: Tape Dental Injury: Teeth and Oropharynx as per pre-operative assessment

## 2022-02-04 NOTE — Plan of Care (Signed)

## 2022-02-04 NOTE — H&P (Signed)
Electrophysiology Office Note:     Date:  02/04/2021    ID:  Kristy Arias, DOB 14-Oct-1950, MRN [id number]   PCP:  Pearline Cables, MD      [facility] Cardiologist:  None  [facility] Electrophysiologist:  Lanier Prude, MD    Referring MD: Pearline Cables, MD    Chief Complaint: Paroxysmal atrial fibrillation   History of Present Illness:     Kristy Arias is a 72 y.o. female who presents for an evaluation of paroxysmal atrial fibrillation at the request of Dr. Rosemary Holms.  The patient has a history of hypertension.  The patient last saw Dr. Rosemary Holms September 09, 2021.  At that visit she reported continued episodes of atrial fibrillation.  She takes Eliquis twice daily for stroke prophylaxis.  The patient has severe arthritis and would like to be to take scheduled NSAIDs but has been unable to do so given her use of anticoagulation.  She is referred for possible watchman implant.   Her episodes of A-fib started 2-1/2 years ago and have increasingly become more frequent.  They are highly symptomatic and she feels wiped out when she is having A-fib.  Episodes last for hours at a time.  She also has chronic arthritis pain in multiple joints.  She has had 2 prior knee replacements.  She tells me that NSAIDs will be important moving forward to help control her pain and maintain her mobility.    Presents for [employer] implant. Procedure reviewed.   Objective      Past Medical History:  Diagnosis Date   Anemia     Anxiety     Arthritis     Depression     GERD (gastroesophageal reflux disease)     Glaucoma     Headache     Heart murmur     Hyperlipidemia     Hypertension     Hypothyroidism     Shortness of breath      syndrome x   Syndrome X, cardiac [facility])             Past Surgical History:  Procedure Laterality Date   ABDOMINAL HYSTERECTOMY       BREAST ENHANCEMENT SURGERY       BREAST SURGERY       CARDIAC CATHETERIZATION   4/14   CHOLECYSTECTOMY        COSMETIC SURGERY       DILATION AND CURETTAGE OF UTERUS   2013   EYE SURGERY       LAPAROSCOPIC CHOLECYSTECTOMY SINGLE PORT N/A 06/29/2012    Procedure: LAPAROSCOPIC CHOLECYSTECTOMY SINGLE PORT IOC ;  Surgeon: Ardeth Sportsman, MD;  Location: [facility]S;  Service: General;  Laterality: N/A;   LEFT HEART CATHETERIZATION WITH CORONARY ANGIOGRAM N/A 05/23/2012    Procedure: LEFT HEART CATHETERIZATION WITH CORONARY ANGIOGRAM;  Surgeon: [name], MD;  Location: [facility] CATH LAB;  Service: Cardiovascular;  Laterality: N/A;   TOTAL KNEE ARTHROPLASTY Right 08/25/2015    Procedure: RIGHT TOTAL KNEE ARTHROPLASTY;  Surgeon: Ollen Gross, MD;  Location: [facility] ORS;  Service: Orthopedics;  Laterality: Right;   TOTAL KNEE ARTHROPLASTY Left 08/09/2016    Procedure: LEFT TOTAL KNEE ARTHROPLASTY;  Surgeon: Ollen Gross, MD;  Location: [facility] ORS;  Service: Orthopedics;  Laterality: Left;      Current Medications: Active Medications      Current Meds  Medication Sig   ALPRAZolam (XANAX) 0.5 MG tablet Take 1 tablet (0.5 mg total) by mouth 2 (  two) times daily as needed for anxiety.   apixaban (ELIQUIS) 5 MG TABS tablet Take 1 tablet (5 mg total) by mouth 2 (two) times daily.   butalbital-acetaminophen-caffeine (FIORICET) 50-325-40 MG tablet Take 1 tablet by mouth 2 (two) times daily as needed.   calcium carbonate (TUMS - DOSED IN MG ELEMENTAL CALCIUM) 500 MG chewable tablet Chew 1 tablet by mouth 2 (two) times daily.   cetirizine (ZYRTEC) 10 MG tablet Take 10 mg by mouth at bedtime.   diltiazem (CARDIZEM CD) 240 MG 24 hr capsule Take 1 capsule (240 mg total) by mouth daily.   estradiol (VIVELLE-DOT) 0.1 MG/24HR patch Place 1 patch onto the skin 2 (two) times a week. Sunday & Thursday   ezetimibe (ZETIA) 10 MG tablet TAKE 1 TABLET BY MOUTH EVERY DAY   levothyroxine (SYNTHROID) 50 MCG tablet Take 1 tablet by mouth daily before breakfast.   losartan (COZAAR) 50 MG tablet TAKE 1 TABLET BY MOUTH ONCE DAILY   rosuvastatin  (CRESTOR) 5 MG tablet TAKE 1 TABLET BY MOUTH 3 TIMES WEEKLY   UNABLE TO FIND Med Name: Delta 8   venlafaxine XR (EFFEXOR-XR) 37.5 MG 24 hr capsule TAKE 2 CAPSULES BY MOUTH daily WITH BREAKFAST        Allergies:   Patient has no known allergies.    Social History         Socioeconomic History   Marital status: Married      Spouse name: Not on file   Number of children: 2   Years of education: 17   Highest education level: Not on file  Occupational History   Not on file  Tobacco Use   Smoking status: Never   Smokeless tobacco: Never  Vaping Use   Vaping Use: Never used  Substance and Sexual Activity   Alcohol use: No   Drug use: No   Sexual activity: Yes      Birth control/protection: Surgical  Other Topics Concern   Not on file  Social History Narrative    Right handed    Drinks caffeine prn    Two story home    Social Determinants of Health    Financial Resource Strain: Not on file  Food Insecurity: Not on file  Transportation Needs: Not on file  Physical Activity: Not on file  Stress: Not on file  Social Connections: Not on file      Family History: The patient's family history includes Cancer in her mother and sister; Diabetes in her father and maternal grandfather; Heart disease in her father, maternal grandfather, maternal grandmother, paternal grandfather, and paternal grandmother; Hyperlipidemia in her father, maternal grandfather, and mother; Hypertension in her father, maternal grandmother, and mother; Osteoporosis in her paternal grandmother.   ROS:   Please see the history of present illness.    All other systems reviewed and are negative.   EKGs/Labs/Other Studies Reviewed:     The following studies were reviewed today:   August 12, 2021 echo Normal LV function, 60% Mild TR       Recent Labs: 03/25/2021: ALT 16; TSH 2.92 07/11/2021: Magnesium 1.5 08/17/2021: BUN 12; Creatinine, Ser 0.80; Hemoglobin 14.3; Platelets 292; Potassium 4.3; Sodium 137   Recent Lipid Panel Labs (Brief)          Component Value Date/Time    CHOL 180 03/25/2021 1116    TRIG 109.0 03/25/2021 1116    HDL 71.20 03/25/2021 1116    CHOLHDL 3 03/25/2021 1116    VLDL 21.8 03/25/2021 1116  [place] 87 03/25/2021 1116        Physical Exam:     VS:  BP 142/75   Pulse 66   Ht 5\' 1"  (1.549 m)   Wt 120 lb 9.6 oz (54.7 kg)   SpO2 99%   BMI 22.79 kg/m         Wt Readings from Last 3 Encounters:  09/18/21 120 lb 9.6 oz (54.7 kg)  09/09/21 123 lb (55.8 kg)  08/17/21 120 lb (54.4 kg)      GEN:  Well nourished, well developed in no acute distress HEENT: Normal NECK: No JVD; No carotid bruits LYMPHATICS: No lymphadenopathy CARDIAC: RRR, no murmurs, rubs, gallops RESPIRATORY:  Clear to auscultation without rales, wheezing or rhonchi  ABDOMEN: Soft, non-tender, non-distended MUSCULOSKELETAL:  No edema; No deformity  SKIN: Warm and dry NEUROLOGIC:  Alert and oriented x 3 PSYCHIATRIC:  Normal affect          Assessment ASSESSMENT:     1. PAF (paroxysmal atrial fibrillation) ([place])   2. Primary hypertension   3. Pre-op evaluation     PLAN:     In order of problems listed above:   #Paroxysmal atrial fibrillation Doing well after ablation.  -------------------------   I have seen Kristy Arias in the office today who is being considered for a Watchman left atrial appendage closure device. I believe they will benefit from this procedure given their history of atrial fibrillation, CHA2DS2-VASc score of 3 and unadjusted ischemic stroke rate of 3.2% per year. Unfortunately, the patient is not felt to be a long term anticoagulation candidate secondary to chronic NSAID use. The patient's chart has been reviewed and I feel that they would be a candidate for short term oral anticoagulation after Watchman implant.    It is my belief that after undergoing a LAA closure procedure, Kristy Arias will not need long term anticoagulation which eliminates  anticoagulation side effects and major bleeding risk.    Procedural risks for the Watchman implant have been reviewed with the patient including a 0.5% risk of stroke, <1% risk of perforation and <1% risk of device embolization. Other risks include bleeding, vascular damage, tamponade, worsening renal function, and death. The patient understands these risk and wishes to proceed.       The published clinical data on the safety and effectiveness of WATCHMAN include but are not limited to the following: - Holmes DR, Mechele Claude, Sick P et al. for the PROTECT AF Investigators. Percutaneous closure of the left atrial appendage versus warfarin therapy for prevention of stroke in patients with atrial fibrillation: a randomised non-inferiority trial. Lancet 2009; 374: 534-42. Mechele Claude, Doshi SK, Abelardo Diesel D et al. on behalf of the PROTECT AF Investigators. Percutaneous Left Atrial Appendage Closure for Stroke Prophylaxis in Patients With Atrial Fibrillation 2.3-Year Follow-up of the PROTECT AF (Watchman Left Atrial Appendage System for Embolic Protection in Patients With Atrial Fibrillation) Trial. Circulation 2013; 127:720-729. - Alli O, Doshi S,  Kar S, Reddy VY, Sievert H et al. Quality of Life Assessment in the Randomized PROTECT AF (Percutaneous Closure of the Left Atrial Appendage Versus Warfarin Therapy for Prevention of Stroke in Patients With Atrial Fibrillation) Trial of Patients at Risk for Stroke With Nonvalvular Atrial Fibrillation. J Am Coll Cardiol 2013; [place], Tarri Abernethy, Price M, Whisenant B, Sievert H, Doshi S, Huber K, Reddy V. Prospective randomized evaluation of the Watchman left atrial appendage Device in patients with atrial fibrillation versus long-term  warfarin therapy; the PREVAIL trial. Journal of the [employer] of Cardiology, Vol. 4, No. 1, 2014, 1-11. - Kar S, Doshi SK, Sadhu A, Horton R, Osorio J et al. Primary outcome evaluation of a next-generation left  atrial appendage closure device: results from the PINNACLE FLX trial. Circulation 2021;143(18)1754-1762.      After today's visit with the patient which was dedicated solely for shared decision making visit regarding LAA closure device, the patient decided to proceed with the LAA appendage closure procedure scheduled to be done in the near future at [facility]. Prior to the procedure, I would like to obtain a gated CT scan of the chest with contrast timed for PV/LA visualization.      HAS-BLED score 2 Hypertension Yes  Abnormal renal and liver function (Dialysis, transplant, Cr >2.26 mg/dL /Cirrhosis or Bilirubin >2x Normal or AST/ALT/AP >3x Normal) No  Stroke No  Bleeding No  Labile INR (Unstable/high INR) No  Elderly (>65) Yes  Drugs or alcohol (? 8 drinks/week, anti-plt or NSAID) No    CHA2DS2-VASc Score = 3  The patient's score is based upon: CHF History: 0 HTN History: 1 Diabetes History: 0 Stroke History: 0 Vascular Disease History: 0 Age Score: 1 Gender Score: 1            Signed, Sheria Lang T. Lalla Brothers, MD, [facility], [facility] 02/04/2021 Electrophysiology [facility]

## 2022-02-04 NOTE — Discharge Summary (Signed)
HEART AND VASCULAR CENTER   MULTIDISCIPLINARY HEART TEAM   DISCHARGE SUMMARY   Patient ID: Kristy Arias,  MRN: [id number], DOB/AGE: 03/04/1950 72 y.o.  Admit date: 02/04/2022 Discharge date: 02/04/2022  Primary Care Physician: Darreld Mclean, MD  Primary Cardiologist: None  Electrophysiologist: Vickie Epley, MD  Primary Discharge Diagnosis:  Paroxysmal Atrial Fibrillation Poor candidacy for long term anticoagulation due to  severe osteoarthritis requiring NSAIDs  Secondary Discharge Diagnosis:  HTN HLD GERD OA  Procedures This Admission:  Transeptal Puncture Intra-procedural TEE which showed no LAA thrombus Left atrial appendage occlusive device placement on 02/04/22 by Dr. Quentin Ore.  This study demonstrated: IMPRESSIONS   1. Interventional TEE for LAAO procedure. Prior to procedure, there was  no evidence of left atrial appendage thrombus. Maximal left atrial  dimension was 21 mm, with sufficient depth for a 27 mm Watchman FLX  device. Transeptal puncture performed with no  complication. Placement of the device was optimized with no peri-device  leak and with an average compression of 22%. There was a moderate mitral  should with no device-valve interactions. Post procedure there was a left  to right shunt. No change in  pericardial effusion size before and after case.. Left atrial size was  mild to moderately dilated. No left atrial/left atrial appendage thrombus  was detected.   2. Left ventricular ejection fraction, by estimation, is 60 to 65%. The  left ventricle has normal function.   3. Right ventricular systolic function is normal. The right ventricular  size is normal.   4. Right atrial size was mildly dilated.   5. Evidence of atrial level shunting detected by color flow Doppler.   6. The aortic valve is tricuspid. Aortic valve regurgitation is not  visualized.   7. A small pericardial effusion is present. The pericardial effusion is  posterior to  the left ventricle.   8. The mitral valve is grossly normal. No evidence of mitral valve  regurgitation.     Brief HPI: Kristy Arias is a 72 y.o. female with a history of  HTN, HLD, hypothyroidism, and paroxysmal atrial fibrillation who presented to [facility] today for planned LAAO with Watchman.   Ms. Sowder has been followed by the [facility] and Dr. Virgina Jock for her cardiology care. She was initially seen by Dr. Quentin Ore due to increased frequency of AF. She underwent atrial fibrillation ablation 10/23/21. Since that time she has been feeling well although on last evaluation although she "possible AF" notifications on her Kardia mobile device. Strips were reviewed by the AF Clinic which showed intermittent AF and NSR with PACs although he was noted to be back in SR on last follow up. She has significant arthritis which is typically controlled with NSAIDs however due to the need for anticoagulation, she has been unable to take this and wishes to proceed with Watchman in hopes to avoid long term AC.   The patient was seen as an outpatient and thought to be a poor candidate for continued anticoagulation due to severe osteoarthritis requiring NSAIDs. Risks, benefits, and alternatives to left atrial appendage occlusive device placement device were reviewed with the patient who wished to proceed.  The patient underwent intra-procedural TEE as above.   Hospital Course:  The patient was admitted and underwent Left Atrial appendage occlusive device placement with details as outlined above.  They were monitored on telemetry which demonstrated sinus rhythm. Groin was without complication. The patient was examined and considered to be stable for discharge.  Wound  care and restrictions were reviewed with the patient.    Post Implant Anticoagulation Strategy: Continue Eliquis and add Aspirin 81mg  PO daily for 45 days. After 45 days, take Aspirin 81mg  PO daily and Plavix 75mg  PO daily to complete 6  months of post implant therapy. Plan for CT scan after 60 days to assess Watchman position.     _______________________  HAS-BLED score 2 Hypertension Yes  Abnormal renal and liver function (Dialysis, transplant, Cr >2.26 mg/dL /Cirrhosis or Bilirubin >2x Normal or AST/ALT/AP >3x Normal) No  Stroke No  Bleeding No  Labile INR (Unstable/high INR) No  Elderly (>65) Yes  Drugs or alcohol (? 8 drinks/week, anti-plt or NSAID) No    CHA2DS2-VASc Score = 3  The patient's score is based upon: CHF History: 0 HTN History: 1 Diabetes History: 0 Stroke History: 0 Vascular Disease History: 0 Age Score: 1 Gender Score: 1    Physical Exam: Vitals:   02/04/22 1115 02/04/22 1130 02/04/22 1200 02/04/22 1230  BP: 117/60 (!) 113/56 120/61   Pulse: (!) 55 63 60 66  Resp: 13 20 17  (!) 21  Temp:      TempSrc:      SpO2: 92% 96% 94% 93%  Weight:      Height:        GEN- The patient is well appearing, alert and oriented x 3 today.   HEENT: normocephalic, atraumatic; sclera clear, conjunctiva pink; hearing intact; oropharynx clear; neck supple  Lungs- Clear to ausculation bilaterally, normal work of breathing.  No wheezes, rales, rhonchi Heart- Regular rate and rhythm, no murmurs, rubs or gallops  GI- soft, non-tender, non-distended, bowel sounds present  Extremities- no clubbing, cyanosis, or edema; DP/PT/radial pulses 2+ bilaterally, groin without hematoma/bruit MS- no significant deformity or atrophy Skin- warm and dry, no rash or lesion Psych- euthymic mood, full affect Neuro- strength and sensation are intact   Labs:   Lab Results  Component Value Date   WBC 6.1 01/06/2022   HGB 14.4 01/06/2022   HCT 42.7 01/06/2022   MCV 95 01/06/2022   PLT 295 01/06/2022   No results for input(s): "NA", "K", "CL", "CO2", "BUN", "CREATININE", "CALCIUM", "PROT", "BILITOT", "ALKPHOS", "ALT", "AST", "GLUCOSE" in the last 168 hours.  Invalid input(s): "LABALBU"   Discharge Medications:   Allergies as of 02/04/2022   No Known Allergies      Medication List     TAKE these medications    ALPRAZolam 0.5 MG tablet Commonly known as: XANAX TAKE 1 TABLET BY MOUTH 2 TIMES DAILY AS NEEDED FOR anxiety - [employer] BRAND ONLY What changed: See the new instructions.   aspirin EC 81 MG tablet Take 1 tablet (81 mg total) by mouth daily. Swallow whole.   B-12 SL Take 1,000 mcg by mouth in the morning.   Biotin 1000 MCG tablet Take 1,000 mcg by mouth daily.   butalbital-acetaminophen-caffeine 50-325-40 MG tablet Commonly known as: FIORICET TAKE 1 TABLET BY MOUTH 2 TIMES DAILY AS NEEDED What changed: See the new instructions.   calcium elemental as carbonate 400 MG chewable tablet Commonly known as: BARIATRIC TUMS ULTRA Chew 1 tablet by mouth 2 (two) times daily.   cetirizine 10 MG tablet Commonly known as: ZYRTEC Take 10 mg by mouth daily.   diltiazem 240 MG 24 hr capsule Commonly known as: Cardizem CD Take 1 capsule (240 mg total) by mouth daily.   ELDERBERRY ZINC/VIT C/IMMUNE MT Use as directed 1 tablet in the mouth or throat.   Eliquis 5  MG Tabs tablet Generic drug: apixaban TAKE 1 TABLET BY MOUTH 2 TIMES DAILY   estradiol 0.1 MG/24HR patch Commonly known as: VIVELLE-DOT Place 1 patch onto the skin 2 (two) times a week. Wednesdays & Saturdays.   ezetimibe 10 MG tablet Commonly known as: ZETIA TAKE 1 TABLET BY MOUTH EVERY DAY   hydroxypropyl methylcellulose / hypromellose 2.5 % ophthalmic solution Commonly known as: ISOPTO TEARS / GONIOVISC Place 1 drop into both eyes daily as needed for dry eyes.   levothyroxine 50 MCG tablet Commonly known as: SYNTHROID Take 1 tablet (50 mcg total) by mouth daily before breakfast.   losartan 50 MG tablet Commonly known as: COZAAR TAKE 1 TABLET BY MOUTH ONCE DAILY   OVER THE COUNTER MEDICATION Take 1 capsule by mouth at bedtime. Delta 8   rosuvastatin 5 MG tablet Commonly known as: CRESTOR TAKE 1 TABLET BY  MOUTH 3 TIMES WEEKLY   venlafaxine XR 37.5 MG 24 hr capsule Commonly known as: EFFEXOR-XR TAKE 2 CAPSULES BY MOUTH DAILY WITH BREAKFAST   Vitamin D3 50 MCG (2000 UT) capsule Take 2,000 Units by mouth in the morning.         Disposition:  home today   Follow-up Information     Filbert Schilder, NP. Go on 03/15/2022.   Specialty: Cardiology Why: @ 10:15am, please arrive at least 10 min early Contact information: [address] [place] [address] [phone]                 Duration of Discharge Encounter: Greater than 30 minutes including physician time.  Signed, Cline Crock, PA-C  02/04/2022 3:37 PM

## 2022-02-04 NOTE — Progress Notes (Signed)
Mobility Specialist Progress Note    02/04/22 1455  Mobility  Activity Ambulated independently in hallway  Level of Assistance Standby assist, set-up cues, supervision of patient - no hands on  Assistive Device None  Distance Ambulated (ft) 350 ft  Activity Response Tolerated well  Mobility Referral Yes  $Mobility charge 1 Mobility   Pre-Mobility: 70 HR, 126/67 (83) BP, 94% SpO2 During Mobility: 120 HR, 97% SpO2 Post-Mobility: 76 HR, 143/74 (95) BP, 95% SpO2  Pt received in bed and agreeable. No complaints on walk. Returned to chair with call bell in reach. RN notified.    Hildred Alamin Mobility Specialist  Please Psychologist, sport and exercise or Rehab Office at [phone]

## 2022-02-04 NOTE — Anesthesia Preprocedure Evaluation (Addendum)
Anesthesia Evaluation  Patient identified by MRN, date of birth, ID band Patient awake    Reviewed: Allergy & Precautions, NPO status , Patient's Chart, lab work & pertinent test results  Airway Mallampati: I  TM Distance: >3 FB Neck ROM: Full    Dental no notable dental hx.    Pulmonary neg pulmonary ROS   Pulmonary exam normal        Cardiovascular hypertension, Pt. on medications Normal cardiovascular exam+ dysrhythmias Atrial Fibrillation      Neuro/Psych  Headaches PSYCHIATRIC DISORDERS Anxiety Depression       GI/Hepatic negative GI ROS, Neg liver ROS,,,  Endo/Other  Hypothyroidism    Renal/GU negative Renal ROS     Musculoskeletal  (+) Arthritis ,    Abdominal   Peds  Hematology  (+) Blood dyscrasia (Eliquis)   Anesthesia Other Findings Atrial fibrillation, unspecified type  Reproductive/Obstetrics                             Anesthesia Physical Anesthesia Plan  ASA: 3  Anesthesia Plan: General   Post-op Pain Management:    Induction: Intravenous  PONV Risk Score and Plan: 3 and Ondansetron, Dexamethasone, Midazolam and Treatment may vary due to age or medical condition  Airway Management Planned: Oral ETT  Additional Equipment: TEE and ClearSight  Intra-op Plan:   Post-operative Plan: Extubation in OR  Informed Consent: I have reviewed the patients History and Physical, chart, labs and discussed the procedure including the risks, benefits and alternatives for the proposed anesthesia with the patient or authorized representative who has indicated his/her understanding and acceptance.     Dental advisory given  Plan Discussed with: CRNA  Anesthesia Plan Comments: (TEE probe placement only for cardiologist)       Anesthesia Quick Evaluation

## 2022-02-05 ENCOUNTER — Encounter (HOSPITAL_COMMUNITY): Payer: Self-pay | Admitting: Cardiology

## 2022-02-05 ENCOUNTER — Telehealth: Payer: Self-pay | Admitting: Physician Assistant

## 2022-02-05 NOTE — Telephone Encounter (Signed)
  HEART AND VASCULAR CENTER   Watchman Team  Contacted the patient regarding discharge from [facility] on 02/04/22  The patient understands to follow up with Kathyrn Drown NP on 2/19 in preparation for imaging on 3/15.  The patient understands discharge instructions? Yes  The patient understands medications and regimen? Yes   The patient reports groin site looks good.   The patient understands to call with any questions or concerns prior to scheduled visit.

## 2022-02-11 ENCOUNTER — Telehealth: Payer: Self-pay | Admitting: Cardiology

## 2022-02-11 NOTE — Telephone Encounter (Signed)
Spoke with patient who states she noticed a small lump at insertion site, about dime sized along with some bruising that is now green in color. She is one week out from her procedure. She denies pain and the site is intact with not bleeding. Advised that this is to be expected and should resolve with time. Advised to please let us know it the lump increases in size or she has any other concerns. Patient verbalized understanding and had no questions.

## 2022-02-11 NOTE — Telephone Encounter (Signed)
Pt states it feels like a lump at the incision site where she had her watchman procedure. She states it is a little tender and some bruising. Please advise.

## 2022-02-19 ENCOUNTER — Encounter: Payer: Self-pay | Admitting: Family Medicine

## 2022-02-25 ENCOUNTER — Other Ambulatory Visit: Payer: Self-pay | Admitting: Cardiology

## 2022-02-25 ENCOUNTER — Encounter: Payer: Self-pay | Admitting: *Deleted

## 2022-02-25 ENCOUNTER — Other Ambulatory Visit: Payer: Self-pay | Admitting: Family Medicine

## 2022-02-25 DIAGNOSIS — E782 Mixed hyperlipidemia: Secondary | ICD-10-CM

## 2022-03-03 ENCOUNTER — Encounter: Payer: Self-pay | Admitting: *Deleted

## 2022-03-12 ENCOUNTER — Ambulatory Visit: Payer: Medicare Other | Admitting: Cardiology

## 2022-03-12 NOTE — Progress Notes (Unsigned)
[facility]                                     Cardiology Office Note:    Date:  03/15/2022   ID:  Kristy Arias, DOB October 31, 1950, MRN [id number]  PCP:  Darreld [facility], MD  [facility] Cardiologist:  None  [facility] Electrophysiologist:  Vickie Epley, MD   Referring MD: Darreld [facility], MD   Chief Complaint  Patient presents with   Follow-up    S/p LAAO    History of Present Illness:    Kristy Arias is a 72 y.o. female with a hx of HTN, HLD, hypothyroidism, and paroxysmal atrial fibrillation who presented to for post LAAO closure with Watchman follow up.    Kristy Arias has been followed by the [facility] and Dr. Virgina Jock for her cardiology care. She was initially seen by Dr. Quentin Ore due to increased frequency of AF. She underwent atrial fibrillation ablation 10/23/21. Since that time she has been feeling well although on last evaluation although she "possible AF" notifications on her Kardia mobile device. Strips were reviewed by the [facility] Clinic which showed intermittent AF and NSR with PACs although he was noted to be back in SR on last follow up. She has significant arthritis which is typically controlled with NSAIDs however due to the need for anticoagulation, she has been unable to take this and wishes to proceed with Watchman in hopes to avoid long term AC.    The patient was seen as an outpatient and thought to be a poor candidate for continued anticoagulation due to severe osteoarthritis requiring NSAIDs. She is now s/p LAAO closure with Watchman FLX device. She was restarted on Eliquis and Aspirin 2m PO daily was added to her regimen. She will continue for for 45 days then take Aspirin 822mPO daily and Plavix 7579mO daily to complete 6 months of post implant therapy.Plan for CT scan after 60 days to assess Watchman position.   Today she is here alone and reports that she has been very well since implant. She has no chest pain,  palpitations, LE edema, SOB, orthopnea, dizziness, bleeding in stool or urine, or syncope.      Past Medical History:  Diagnosis Date   Anemia    Anxiety    Arthritis    Depression    GERD (gastroesophageal reflux disease)    Glaucoma    Headache    Hyperlipidemia    Hypertension    Hypothyroidism    Presence of Watchman left atrial appendage closure device 02/04/2022   s/p LAAO with a 27 mm Watchman FLX by Dr LamQuentin OreSyndrome X, Rhunette Croftardiac     Past Surgical History:  Procedure Laterality Date   ABDOMINAL HYSTERECTOMY     ATRIAL FIBRILLATION ABLATION N/A 10/23/2021   Procedure: ATRIAL FIBRILLATION ABLATION;  Surgeon: LamVickie EpleyD;  Location: [facility];  Service: Cardiovascular;  Laterality: N/A;   BREAST ENHANCEMENT SURGERY     BREAST SURGERY     CARDIAC CATHETERIZATION  4/14   CHOLECYSTECTOMY     COSMETIC SURGERY     DILATION AND CURETTAGE OF UTERUS  2013   EYE SURGERY     LAPAROSCOPIC CHOLECYSTECTOMY SINGLE PORT N/A 06/29/2012   Procedure: LAPAROSCOPIC CHOLECYSTECTOMY SINGLE PORT IOC ;  Surgeon: SteAdin HectorD;  Location: [facility];  Service:  General;  Laterality: N/A;   LEFT ATRIAL APPENDAGE OCCLUSION N/A 02/04/2022   Procedure: LEFT ATRIAL APPENDAGE OCCLUSION;  Surgeon: Vickie Epley, MD;  Location: [place] [facility];  Service: Cardiovascular;  Laterality: N/A;   LEFT HEART CATHETERIZATION WITH CORONARY ANGIOGRAM N/A 05/23/2012   Procedure: LEFT HEART CATHETERIZATION WITH CORONARY ANGIOGRAM;  Surgeon: Laverda Page, MD;  Location: [facility];  Service: Cardiovascular;  Laterality: N/A;   TEE WITHOUT CARDIOVERSION N/A 02/04/2022   Procedure: TRANSESOPHAGEAL ECHOCARDIOGRAM (TEE);  Surgeon: Vickie Epley, MD;  Location: [place] [facility];  Service: Cardiovascular;  Laterality: N/A;   TOTAL KNEE ARTHROPLASTY Right 08/25/2015   Procedure: RIGHT TOTAL KNEE ARTHROPLASTY;  Surgeon: Gaynelle Arabian, MD;  Location: [facility];  Service: Orthopedics;   Laterality: Right;   TOTAL KNEE ARTHROPLASTY Left 08/09/2016   Procedure: LEFT TOTAL KNEE ARTHROPLASTY;  Surgeon: Gaynelle Arabian, MD;  Location: [facility];  Service: Orthopedics;  Laterality: Left;    Current Medications: Current Meds  Medication Sig   ALPRAZolam (XANAX) 0.5 MG tablet TAKE 1 TABLET BY MOUTH 2 TIMES DAILY AS NEEDED FOR anxiety - GREENSTONE BRAND ONLY (Patient taking differently: Take 0.25 mg by mouth daily as needed for anxiety or sleep.)   amoxicillin (AMOXIL) 500 MG tablet Take 4 tablets (2,000 mg total) by mouth as directed. 1 HOUR PRIOR TO DENTAL APPOINTMENTS   aspirin EC 81 MG tablet Take 1 tablet (81 mg total) by mouth daily. Swallow whole.   butalbital-acetaminophen-caffeine (FIORICET) 50-325-40 MG tablet TAKE 1 TABLET BY MOUTH 2 TIMES DAILY AS NEEDED (Patient taking differently: Take 1 tablet by mouth daily as needed for headache.)   calcium elemental as carbonate (BARIATRIC TUMS ULTRA) 400 MG chewable tablet Chew 1 tablet by mouth 2 (two) times daily.   cetirizine (ZYRTEC) 10 MG tablet Take 10 mg by mouth daily.   Cholecalciferol (VITAMIN D3) 50 MCG (2000 UT) capsule Take 2,000 Units by mouth in the morning.   clopidogrel (PLAVIX) 75 MG tablet Take 1 tablet (75 mg total) by mouth daily. START ON 2/26 AND STOP ELIQUIS ON 2/25   Cyanocobalamin (B-12 SL) Take 1,000 mcg by mouth in the morning.   diltiazem (CARDIZEM CD) 240 MG 24 hr capsule TAKE 1 CAPSULE BY MOUTH EVERY DAY   estradiol (VIVELLE-DOT) 0.1 MG/24HR patch Place 1 patch onto the skin 2 (two) times a week. Wednesdays & Saturdays.   ezetimibe (ZETIA) 10 MG tablet TAKE 1 TABLET BY MOUTH EVERY DAY   hydroxypropyl methylcellulose / hypromellose (ISOPTO TEARS / GONIOVISC) 2.5 % ophthalmic solution Place 1 drop into both eyes daily as needed for dry eyes.   levothyroxine (SYNTHROID) 50 MCG tablet Take 1 tablet (50 mcg total) by mouth daily before breakfast.   losartan (COZAAR) 50 MG tablet TAKE 1 TABLET BY MOUTH ONCE DAILY    Misc Natural Products (ELDERBERRY ZINC/VIT C/IMMUNE MT) Use as directed 1 tablet in the mouth or throat.   OVER THE COUNTER MEDICATION Take 1 capsule by mouth at bedtime. Delta 8   rosuvastatin (CRESTOR) 5 MG tablet TAKE 1 TABLET BY MOUTH 3 TIMES WEEKLY   venlafaxine XR (EFFEXOR-XR) 37.5 MG 24 hr capsule TAKE 2 CAPSULES BY MOUTH DAILY WITH BREAKFAST   [DISCONTINUED] apixaban (ELIQUIS) 5 MG TABS tablet TAKE 1 TABLET BY MOUTH 2 TIMES DAILY     Allergies:   Patient has no known allergies.   Social History   Socioeconomic History   Marital status: Married    Spouse name: Not on file  Number of children: 2   Years of education: 17   Highest education level: Not on file  Occupational History   Not on file  Tobacco Use   Smoking status: Never   Smokeless tobacco: Never   Tobacco comments:    Never smoke 11/20/21  Vaping Use   Vaping Use: Never used  Substance and Sexual Activity   Alcohol use: No   Drug use: No   Sexual activity: Yes    Birth control/protection: Surgical  Other Topics Concern   Not on file  Social History Narrative   Right handed   Drinks caffeine prn   Two story home   Social Determinants of Health   Financial Resource Strain: Low Risk  (12/09/2021)   Overall Financial Resource Strain (CARDIA)    Difficulty of Paying Living Expenses: Not hard at all  Food Insecurity: No Food Insecurity (12/09/2021)   Hunger Vital Sign    Worried About Running Out of Food in the Last Year: Never true    Ran Out of Food in the Last Year: Never true  Transportation Needs: No Transportation Needs (12/09/2021)   PRAPARE - Hydrologist (Medical): No    Lack of Transportation (Non-Medical): No  Physical Activity: Inactive (12/09/2021)   Exercise Vital Sign    Days of Exercise per Week: 0 days    Minutes of Exercise per Session: 0 min  Stress: No Stress Concern Present (12/09/2021)   [place]    Feeling of Stress : Not at all  Social Connections: Not on file     Family History: The patient's family history includes Cancer in her mother and sister; Diabetes in her father and maternal grandfather; Heart disease in her father, maternal grandfather, maternal grandmother, paternal grandfather, and paternal grandmother; Hyperlipidemia in her father, maternal grandfather, and mother; Hypertension in her father, maternal grandmother, and mother; Osteoporosis in her paternal grandmother.  ROS:   Please see the history of present illness.    All other systems reviewed and are negative.  EKGs/Labs/Other Studies Reviewed:    The following studies were reviewed today:  Procedures This Admission:  Transeptal Puncture Intra-procedural TEE which showed no LAA thrombus Left atrial appendage occlusive device placement on 02/04/22 by Dr. Quentin Ore.  This study demonstrated: IMPRESSIONS   1. Interventional TEE for LAAO procedure. Prior to procedure, there was  no evidence of left atrial appendage thrombus. Maximal left atrial  dimension was 21 mm, with sufficient depth for a 27 mm Watchman FLX  device. Transeptal puncture performed with no  complication. Placement of the device was optimized with no peri-device  leak and with an average compression of 22%. There was a moderate mitral  should with no device-valve interactions. Post procedure there was a left  to right shunt. No change in  pericardial effusion size before and after case.. Left atrial size was  mild to moderately dilated. No left atrial/left atrial appendage thrombus  was detected.   2. Left ventricular ejection fraction, by estimation, is 60 to 65%. The  left ventricle has normal function.   3. Right ventricular systolic function is normal. The right ventricular  size is normal.   4. Right atrial size was mildly dilated.   5. Evidence of atrial level shunting detected by color flow Doppler.   6. The  aortic valve is tricuspid. Aortic valve regurgitation is not  visualized.   7. A small pericardial effusion is present. The  pericardial effusion is  posterior to the left ventricle.   8. The mitral valve is grossly normal. No evidence of mitral valve  regurgitation.    EKG:  EKG is not ordered today.    Recent Labs: 03/25/2021: ALT 16; TSH 2.92 07/11/2021: Magnesium 1.5 01/06/2022: BUN 11; Creatinine, Ser 0.81; Hemoglobin 14.4; Platelets 295; Potassium 4.5; Sodium 139  Recent Lipid Panel    Component Value Date/Time   CHOL 180 03/25/2021 1116   TRIG 109.0 03/25/2021 1116   HDL 71.20 03/25/2021 1116   CHOLHDL 3 03/25/2021 1116   VLDL 21.8 03/25/2021 1116   LDLCALC 87 03/25/2021 1116   Physical Exam:    VS:  BP 120/68   Pulse 79   Ht 5' 1"$  (1.549 m)   Wt 122 lb (55.3 kg)   SpO2 98%   BMI 23.05 kg/m     Wt Readings from Last 3 Encounters:  03/15/22 122 lb (55.3 kg)  02/04/22 120 lb (54.4 kg)  01/06/22 121 lb 6.4 oz (55.1 kg)    General: Well developed, well nourished, NAD Lungs:Clear to ausculation bilaterally. No wheezes, rales, or rhonchi. Breathing is unlabored. Cardiovascular: RRR with S1 S2. No murmurs Extremities: No edema.  Neuro: Alert and oriented. No focal deficits. No facial asymmetry. MAE spontaneously. Psych: Responds to questions appropriately with normal affect.    ASSESSMENT/PLAN:    Paroxysmal Atrial Fibrillation: She is now s/p Watchman FLX implant 02/04/22 and has done very well. She will continue ASA and Eliquis through 2/25 then stop Eliquis. On 03/22/22 she will continue ASA and add Plavix to continue through 6 months duration. She will need dental SBE for 6 months. Amoxicillin has been supplied. Plan CT to ensure device seal with no leaks. CT instructions review with understanding. She has 6 month f/u with myself. BMET today.     HTN: Stable with no changes needed today    Medication Adjustments/Labs and Tests Ordered: Current medicines are reviewed  at length with the patient today.  Concerns regarding medicines are outlined above.  Orders Placed This Encounter  Procedures   Basic metabolic panel   Meds ordered this encounter  Medications   clopidogrel (PLAVIX) 75 MG tablet    Sig: Take 1 tablet (75 mg total) by mouth daily. START ON 2/26 AND STOP ELIQUIS ON 2/25    Dispense:  90 tablet    Refill:  3   apixaban (ELIQUIS) 5 MG TABS tablet    Sig: Take 1 tablet (5 mg total) by mouth 2 (two) times daily. STOP ON 2/25 AND START PLAVIX ON 2/26    Dispense:  180 tablet    Refill:  3   amoxicillin (AMOXIL) 500 MG tablet    Sig: Take 4 tablets (2,000 mg total) by mouth as directed. 1 HOUR PRIOR TO DENTAL APPOINTMENTS    Dispense:  12 tablet    Refill:  6    Patient Instructions  Medication Instructions:  Your physician has recommended you make the following change in your medication:  STOP ELIQUIS ON 2/25  START PLAVIX ON 2/26  CONTINUE ASPIRIN   START AMOXICILLIN 500 MG - TAKE 2000 MG (4 TABLETS) 1 HOUR PRIOR TO DENTAL CLEANINGS AND PROCEDURES  *If you need a refill on your cardiac medications before your next appointment, please call your pharmacy*   Lab Work: TODAY: BMET If you have labs (blood work) drawn today and your tests are completely normal, you will receive your results only by: [place] (if you have MyChart)  OR A paper copy in the mail If you have any lab test that is abnormal or we need to change your treatment, we will call you to review the results.   Testing/Procedures: SEE CT INSTRUCTION LETTER    Follow-Up: At [facility], you and your health needs are our priority.  As part of our continuing mission to provide you with exceptional heart care, we have created designated Provider Care Teams.  These Care Teams include your primary Cardiologist (physician) and Advanced Practice Providers (APPs -  Physician Assistants and Nurse Practitioners) who all work together to provide you with the  care you need, when you need it.  We recommend signing up for the patient portal called "MyChart".  Sign up information is provided on this After Visit Summary.  MyChart is used to connect with patients for Virtual Visits (Telemedicine).  Patients are able to view lab/test results, encounter notes, upcoming appointments, etc.  Non-urgent messages can be sent to your provider as well.   To learn more about what you can do with MyChart, go to [email or web address].    Your next appointment:   KEEP SCHEDULED APPOINTMENT    Signed, Kathyrn Drown, NP  03/15/2022 3:46 PM    [place]

## 2022-03-15 ENCOUNTER — Ambulatory Visit: Payer: Medicare Other | Attending: Cardiology | Admitting: Cardiology

## 2022-03-15 VITALS — BP 120/68 | HR 79 | Ht 61.0 in | Wt 122.0 lb

## 2022-03-15 DIAGNOSIS — Z95818 Presence of other cardiac implants and grafts: Secondary | ICD-10-CM

## 2022-03-15 DIAGNOSIS — I4891 Unspecified atrial fibrillation: Secondary | ICD-10-CM | POA: Diagnosis not present

## 2022-03-15 DIAGNOSIS — I10 Essential (primary) hypertension: Secondary | ICD-10-CM

## 2022-03-15 DIAGNOSIS — Z01812 Encounter for preprocedural laboratory examination: Secondary | ICD-10-CM

## 2022-03-15 LAB — BASIC METABOLIC PANEL
BUN/Creatinine Ratio: 9 — ABNORMAL LOW (ref 12–28)
BUN: 7 mg/dL — ABNORMAL LOW (ref 8–27)
CO2: 22 mmol/L (ref 20–29)
Calcium: 8.8 mg/dL (ref 8.7–10.3)
Chloride: 104 mmol/L (ref 96–106)
Creatinine, Ser: 0.8 mg/dL (ref 0.57–1.00)
Glucose: 103 mg/dL — ABNORMAL HIGH (ref 70–99)
Potassium: 4 mmol/L (ref 3.5–5.2)
Sodium: 140 mmol/L (ref 134–144)
eGFR: 80 mL/min/{1.73_m2} (ref >59)

## 2022-03-15 MED ORDER — CLOPIDOGREL BISULFATE 75 MG PO TABS: 75.0000 mg | ORAL_TABLET | Freq: Every day | ORAL | 3 refills | Status: DC

## 2022-03-15 MED ORDER — APIXABAN 5 MG PO TABS: 5.0000 mg | ORAL_TABLET | Freq: Two times a day (BID) | ORAL | 3 refills | Status: DC

## 2022-03-15 MED ORDER — AMOXICILLIN 500 MG PO TABS: 2000.0000 mg | ORAL_TABLET | ORAL | 6 refills | Status: DC

## 2022-03-15 NOTE — Patient Instructions (Signed)
Medication Instructions:  Your physician has recommended you make the following change in your medication:  STOP ELIQUIS ON 2/25  START PLAVIX ON 2/26  CONTINUE ASPIRIN   START AMOXICILLIN 500 MG - TAKE 2000 MG (4 TABLETS) 1 HOUR PRIOR TO DENTAL CLEANINGS AND PROCEDURES  *If you need a refill on your cardiac medications before your next appointment, please call your pharmacy*   Lab Work: TODAY: BMET If you have labs (blood work) drawn today and your tests are completely normal, you will receive your results only by: [place] (if you have MyChart) OR A paper copy in the mail If you have any lab test that is abnormal or we need to change your treatment, we will call you to review the results.   Testing/Procedures: SEE CT INSTRUCTION LETTER    Follow-Up: At [facility], you and your health needs are our priority.  As part of our continuing mission to provide you with exceptional heart care, we have created designated Provider Care Teams.  These Care Teams include your primary Cardiologist (physician) and Advanced Practice Providers (APPs -  Physician Assistants and Nurse Practitioners) who all work together to provide you with the care you need, when you need it.  We recommend signing up for the patient portal called "MyChart".  Sign up information is provided on this After Visit Summary.  MyChart is used to connect with patients for Virtual Visits (Telemedicine).  Patients are able to view lab/test results, encounter notes, upcoming appointments, etc.  Non-urgent messages can be sent to your provider as well.   To learn more about what you can do with MyChart, go to [email or web address].    Your next appointment:   KEEP SCHEDULED APPOINTMENT

## 2022-03-23 NOTE — Progress Notes (Addendum)
[place] at [facility] [address], [place], [place] [address] [id number] [phone]  Date:  03/31/2022   Name:  Kristy Arias   DOB:  [birth year]   MRN:  [id number]  PCP:  Darreld Mclean, MD    Chief Complaint: Annual Exam (Concerns/ questions: 1. Pt would like to make sure you are aware of her ablation and watchman procedure. 2. Pt would like to be checked for RA. 3. Pt saya her CXR showed multiple degenerative disks, she would like a ref. 4. Insomnia meds might need to be changed. /Mammogram/ Dexa- pt says she is due and would like it done here now since her GYN has retired. )   History of Present Illness:  Kristy Arias is a 72 y.o. very pleasant female patient who presents with the following:  Pt seen today for a CPE Last seen by myself about one year ago for a CPE History of hypertension, migraine, hyperlipidemia, hypothyroidism , open angle glaucoma followed by optho, atrial fibrillation She was dx with atrial fib since we last saw each other, in June of last year -she presented to the ER and was found to be in atrial fib with RVR She now has a Forensic scientist device and had an LAAO closure per Dr Quentin Ore Pt notes her atrial fib seems to be resolved now  She hopes to be able to stop the plavix soon as she is not able to use NSAIDs while she is on Plavix.  She has typically dependent on OTC NSAIDs for joint pains She notes a lot of hand pain esp- her father had RA, she wonders if RA is a possibility Her hands, shoulder, back,knees somewhat are painful  Cologuard is due- however she did a colonoscopy last year per [facility]- at [facility]-she will call and schedule Dexa- done at [place].  This may be due for an update, she will call and schedule Covid is UTD   Patient notes she has lost about 3 inches of height since her youth.  We looked over her chest films from January which showed multilevel degenerative disks  Eliquis-no loner taking, she is  now just on plavix Plavix Diltiazem Vivelle dot Zetia  Levothyroxine Losartan Crestor Venlafaxine   Lab Results  Component Value Date   TSH 2.92 03/25/2021    Patient Active Problem List   Diagnosis Date Noted   Atrial fibrillation, unspecified type ([place]) 02/04/2022   Presence of Watchman left atrial appendage closure device 02/04/2022   Hypercoagulable state due to paroxysmal atrial fibrillation ([place]) 11/20/2021   PAF (paroxysmal atrial fibrillation) ([place])    Hypothyroidism (acquired) 02/26/2019   OA (osteoarthritis) of knee 08/25/2015   Memory loss 06/05/2014   Chronic migraine without aura without status migrainosus, not intractable 06/05/2014   Benign hypertension 11/02/2011   Fibromuscular dysplasia ([place]) 11/02/2011   Hyperlipidemia 11/02/2011   Depression 11/02/2011    Past Medical History:  Diagnosis Date   Anemia    Anxiety    Arthritis    Depression    GERD (gastroesophageal reflux disease)    Glaucoma    Headache    Hyperlipidemia    Hypertension    Hypothyroidism    Presence of Watchman left atrial appendage closure device 02/04/2022   s/p LAAO with a 27 mm Watchman FLX by Dr Quentin Ore   Syndrome X, cardiac     Past Surgical History:  Procedure Laterality Date   ABDOMINAL HYSTERECTOMY  ATRIAL FIBRILLATION ABLATION N/A 10/23/2021   Procedure: ATRIAL FIBRILLATION ABLATION;  Surgeon: Vickie Epley, MD;  Location: [facility];  Service: Cardiovascular;  Laterality: N/A;   BREAST ENHANCEMENT SURGERY     BREAST SURGERY     CARDIAC CATHETERIZATION  4/14   CHOLECYSTECTOMY     COSMETIC SURGERY     DILATION AND CURETTAGE OF UTERUS  2013   EYE SURGERY     LAPAROSCOPIC CHOLECYSTECTOMY SINGLE PORT N/A 06/29/2012   Procedure: LAPAROSCOPIC CHOLECYSTECTOMY SINGLE PORT IOC ;  Surgeon: Adin Hector, MD;  Location: [facility];  Service: General;  Laterality: N/A;   LEFT ATRIAL APPENDAGE OCCLUSION N/A 02/04/2022   Procedure: LEFT ATRIAL APPENDAGE  OCCLUSION;  Surgeon: Vickie Epley, MD;  Location: [facility];  Service: Cardiovascular;  Laterality: N/A;   LEFT HEART CATHETERIZATION WITH CORONARY ANGIOGRAM N/A 05/23/2012   Procedure: LEFT HEART CATHETERIZATION WITH CORONARY ANGIOGRAM;  Surgeon: Laverda [place], MD;  Location: [facility];  Service: Cardiovascular;  Laterality: N/A;   TEE WITHOUT CARDIOVERSION N/A 02/04/2022   Procedure: TRANSESOPHAGEAL ECHOCARDIOGRAM (TEE);  Surgeon: Vickie Epley, MD;  Location: [facility];  Service: Cardiovascular;  Laterality: N/A;   TOTAL KNEE ARTHROPLASTY Right 08/25/2015   Procedure: RIGHT TOTAL KNEE ARTHROPLASTY;  Surgeon: Gaynelle Arabian, MD;  Location: [facility];  Service: Orthopedics;  Laterality: Right;   TOTAL KNEE ARTHROPLASTY Left 08/09/2016   Procedure: LEFT TOTAL KNEE ARTHROPLASTY;  Surgeon: Gaynelle Arabian, MD;  Location: [facility];  Service: Orthopedics;  Laterality: Left;    Social History   Tobacco Use   Smoking status: Never   Smokeless tobacco: Never   Tobacco comments:    Never smoke 11/20/21  Vaping Use   Vaping Use: Never used  Substance Use Topics   Alcohol use: No   Drug use: No    Family History  Problem Relation Age of Onset   Cancer Mother    Hyperlipidemia Mother    Hypertension Mother    Hypertension Father    Diabetes Father    Heart disease Father    Hyperlipidemia Father    Cancer Sister    Hypertension Maternal Grandmother    Heart disease Maternal Grandmother    Diabetes Maternal Grandfather    Hyperlipidemia Maternal Grandfather    Heart disease Maternal Grandfather    Osteoporosis Paternal Grandmother    Heart disease Paternal Grandmother    Heart disease Paternal Grandfather     No Known Allergies  Medication list has been reviewed and updated.  Current Outpatient Medications on File Prior to Visit  Medication Sig Dispense Refill   ALPRAZolam (XANAX) 0.5 MG tablet TAKE 1 TABLET BY MOUTH 2 TIMES DAILY AS NEEDED FOR anxiety -  [employer] BRAND ONLY (Patient taking differently: Take 0.25 mg by mouth daily as needed for anxiety or sleep.) 60 tablet 0   amoxicillin (AMOXIL) 500 MG tablet Take 4 tablets (2,000 mg total) by mouth as directed. 1 HOUR PRIOR TO DENTAL APPOINTMENTS 12 tablet 6   butalbital-acetaminophen-caffeine (FIORICET) 50-325-40 MG tablet TAKE 1 TABLET BY MOUTH 2 TIMES DAILY AS NEEDED (Patient taking differently: Take 1 tablet by mouth daily as needed for headache.) 60 tablet 0   calcium elemental as carbonate (BARIATRIC TUMS ULTRA) 400 MG chewable tablet Chew 1 tablet by mouth 2 (two) times daily.     cetirizine (ZYRTEC) 10 MG tablet Take 10 mg by mouth daily.     Cholecalciferol (VITAMIN D3) 50 MCG (2000 UT) capsule  Take 2,000 Units by mouth in the morning.     clopidogrel (PLAVIX) 75 MG tablet Take 1 tablet (75 mg total) by mouth daily. START ON 2/26 AND STOP ELIQUIS ON 2/25 90 tablet 3   Cyanocobalamin (B-12 SL) Take 1,000 mcg by mouth in the morning.     diltiazem (CARDIZEM CD) 240 MG 24 hr capsule TAKE 1 CAPSULE BY MOUTH EVERY DAY 90 capsule 1   estradiol (VIVELLE-DOT) 0.1 MG/24HR patch Place 1 patch onto the skin 2 (two) times a week. Wednesdays & Saturdays.     ezetimibe (ZETIA) 10 MG tablet TAKE 1 TABLET BY MOUTH EVERY DAY 90 tablet 3   hydroxypropyl methylcellulose / hypromellose (ISOPTO TEARS / GONIOVISC) 2.5 % ophthalmic solution Place 1 drop into both eyes daily as needed for dry eyes.     levothyroxine (SYNTHROID) 50 MCG tablet Take 1 tablet (50 mcg total) by mouth daily before breakfast. 90 tablet 0   Misc Natural Products (ELDERBERRY ZINC/VIT C/IMMUNE MT) Use as directed 1 tablet in the mouth or throat.     OVER THE COUNTER MEDICATION Take 1 capsule by mouth at bedtime. Delta 8     venlafaxine XR (EFFEXOR-XR) 37.5 MG 24 hr capsule TAKE 2 CAPSULES BY MOUTH DAILY WITH BREAKFAST 60 capsule 2   No current facility-administered medications on file prior to visit.    Review of Systems:  As per  HPI- otherwise negative.   Physical Examination: Vitals:   03/31/22 1051  BP: 132/80  Pulse: 63  Resp: 18  Temp: 97.6 F (36.4 C)  SpO2: 98%   Vitals:   03/31/22 1051  Weight: 121 lb (54.9 kg)  Height: '5\' 2"'$  (1.575 m)   Body mass index is 22.13 kg/m. Ideal Body Weight: Weight in (lb) to have BMI = 25: 136.4  GEN: no acute distress.  Petite build, looks well HEENT: Atraumatic, Normocephalic.  Bilateral TM wnl, oropharynx normal.  PEERL,EOMI.   Ears and Nose: No external deformity. CV: RRR, No M/G/R. No JVD. No thrill. No extra heart sounds. PULM: CTA B, no wheezes, crackles, rhonchi. No retractions. No resp. distress. No accessory muscle use. ABD: S, NT, ND, +BS. No rebound. No HSM. EXTR: No c/c/e PSYCH: Normally interactive. Conversant.  Significant degenerative joint disease is noted in her hands, especially the DIP joints  Assessment and Plan: Physical exam  Hypothyroidism (acquired) - Plan: TSH  Mixed hyperlipidemia - Plan: Lipid panel, rosuvastatin (CRESTOR) 5 MG tablet  Screening for diabetes mellitus - Plan: Hemoglobin A1c  Essential hypertension - Plan: losartan (COZAAR) 50 MG tablet  Atrial fibrillation, unspecified type ([place])  Medication monitoring encounter - Plan: Hepatic function panel  Arthralgia, unspecified joint - Plan: Sedimentation rate, C-reactive protein, Rheumatoid Factor, DG Hand Complete Right, Cyclic citrul peptide antibody, IgG (QUEST)  Physical exam- encouraged healthy diet and exercise routine Will plan further follow- up pending labs. Refilled medications, will need a refill of her levothyroxine once TSH comes in Will do workup for concern of possible rheumatoid arthritis, lab work and right hand x-ray  Signed Lamar Blinks, MD  Received her labs so far, message to patient  Results for orders placed or performed in visit on 03/31/22  TSH  Result Value Ref Range   TSH 1.75 0.35 - 5.50 uIU/mL  Hemoglobin A1c  Result Value Ref  Range   Hgb A1c MFr Bld 5.5 4.6 - 6.5 %  Lipid panel  Result Value Ref Range   Cholesterol 189 0 - 200 mg/dL   Triglycerides  142.0 0.0 - 149.0 mg/dL   HDL 73.90 >39.00 mg/dL   VLDL 28.4 0.0 - 40.0 mg/dL   LDL Cholesterol 87 0 - 99 mg/dL   Total CHOL/HDL Ratio 3    NonHDL 114.92   Hepatic function panel  Result Value Ref Range   Total Bilirubin 0.4 0.2 - 1.2 mg/dL   Bilirubin, Direct 0.1 0.0 - 0.3 mg/dL   Alkaline Phosphatase 70 39 - 117 U/L   AST 14 0 - 37 U/L   ALT 8 0 - 35 U/L   Total Protein 6.5 6.0 - 8.3 g/dL   Albumin 3.8 3.5 - 5.2 g/dL  Sedimentation rate  Result Value Ref Range   Sed Rate 9 0 - 30 mm/hr  C-reactive protein  Result Value Ref Range   CRP <1.0 0.5 - 20.0 mg/dL

## 2022-03-23 NOTE — Patient Instructions (Addendum)
Good to see you again today I will be in touch with your labs asap  Please stop by [facility] at [place] to have your hand x-rays Call [facility] and set up your mammogram and bone density

## 2022-03-31 ENCOUNTER — Encounter: Payer: Self-pay | Admitting: Family Medicine

## 2022-03-31 ENCOUNTER — Other Ambulatory Visit: Payer: Self-pay | Admitting: Family Medicine

## 2022-03-31 ENCOUNTER — Ambulatory Visit (INDEPENDENT_AMBULATORY_CARE_PROVIDER_SITE_OTHER): Payer: Medicare Other | Admitting: Family Medicine

## 2022-03-31 VITALS — BP 132/80 | HR 63 | Temp 97.6°F | Resp 18 | Ht 62.0 in | Wt 121.0 lb

## 2022-03-31 DIAGNOSIS — E782 Mixed hyperlipidemia: Secondary | ICD-10-CM

## 2022-03-31 DIAGNOSIS — Z1231 Encounter for screening mammogram for malignant neoplasm of breast: Secondary | ICD-10-CM

## 2022-03-31 DIAGNOSIS — I4891 Unspecified atrial fibrillation: Secondary | ICD-10-CM

## 2022-03-31 DIAGNOSIS — E039 Hypothyroidism, unspecified: Secondary | ICD-10-CM | POA: Diagnosis not present

## 2022-03-31 DIAGNOSIS — Z5181 Encounter for therapeutic drug level monitoring: Secondary | ICD-10-CM | POA: Diagnosis not present

## 2022-03-31 DIAGNOSIS — I10 Essential (primary) hypertension: Secondary | ICD-10-CM

## 2022-03-31 DIAGNOSIS — E2839 Other primary ovarian failure: Secondary | ICD-10-CM

## 2022-03-31 DIAGNOSIS — Z131 Encounter for screening for diabetes mellitus: Secondary | ICD-10-CM | POA: Diagnosis not present

## 2022-03-31 DIAGNOSIS — M2550 Pain in unspecified joint: Secondary | ICD-10-CM | POA: Diagnosis not present

## 2022-03-31 DIAGNOSIS — Z0000 Encounter for general adult medical examination without abnormal findings: Secondary | ICD-10-CM

## 2022-03-31 DIAGNOSIS — F5101 Primary insomnia: Secondary | ICD-10-CM

## 2022-03-31 LAB — HEPATIC FUNCTION PANEL
ALT: 8 U/L (ref 0–35)
AST: 14 U/L (ref 0–37)
Albumin: 3.8 g/dL (ref 3.5–5.2)
Alkaline Phosphatase: 70 U/L (ref 39–117)
Bilirubin, Direct: 0.1 mg/dL (ref 0.0–0.3)
Total Bilirubin: 0.4 mg/dL (ref 0.2–1.2)
Total Protein: 6.5 g/dL (ref 6.0–8.3)

## 2022-03-31 LAB — C-REACTIVE PROTEIN: CRP: <1 mg/dL (ref 0.5–20.0)

## 2022-03-31 LAB — LIPID PANEL
Cholesterol: 189 mg/dL (ref 0–200)
HDL: 73.9 mg/dL (ref >39.00)
LDL Cholesterol: 87 mg/dL (ref 0–99)
NonHDL: 114.92
Total CHOL/HDL Ratio: 3
Triglycerides: 142 mg/dL (ref 0.0–149.0)
VLDL: 28.4 mg/dL (ref 0.0–40.0)

## 2022-03-31 LAB — TSH: TSH: 1.75 u[IU]/mL (ref 0.35–5.50)

## 2022-03-31 LAB — HEMOGLOBIN A1C: Hgb A1c MFr Bld: 5.5 % (ref 4.6–6.5)

## 2022-03-31 LAB — SEDIMENTATION RATE: Sed Rate: 9 mm/hr (ref 0–30)

## 2022-03-31 MED ORDER — LOSARTAN POTASSIUM 50 MG PO TABS: 50.0000 mg | ORAL_TABLET | Freq: Every day | ORAL | 3 refills | Status: DC

## 2022-03-31 MED ORDER — ROSUVASTATIN CALCIUM 5 MG PO TABS: ORAL_TABLET | ORAL | 3 refills | Status: DC

## 2022-04-01 ENCOUNTER — Ambulatory Visit: Payer: Medicare Other | Admitting: Cardiology

## 2022-04-01 MED ORDER — TRAZODONE HCL 50 MG PO TABS: 25.0000 mg | ORAL_TABLET | Freq: Every evening | ORAL | 3 refills | Status: DC | PRN

## 2022-04-01 NOTE — Addendum Note (Signed)
Addended by: Lamar Blinks C on: 04/01/2022 06:02 AM   Modules accepted: Orders

## 2022-04-02 ENCOUNTER — Ambulatory Visit
Admission: RE | Admit: 2022-04-02 | Discharge: 2022-04-02 | Disposition: A | Discharge status: Home / Self Care | Payer: Medicare Other | Source: Ambulatory Visit | Attending: Family Medicine | Admitting: Family Medicine

## 2022-04-02 DIAGNOSIS — M2550 Pain in unspecified joint: Secondary | ICD-10-CM

## 2022-04-03 ENCOUNTER — Encounter: Payer: Self-pay | Admitting: Family Medicine

## 2022-04-03 LAB — RHEUMATOID FACTOR: Rheumatoid fact SerPl-aCnc: <14 IU/mL (ref <14)

## 2022-04-03 LAB — CYCLIC CITRUL PEPTIDE ANTIBODY, IGG: Cyclic Citrullin Peptide Ab: <16 UNITS

## 2022-04-05 ENCOUNTER — Encounter: Payer: Self-pay | Admitting: Family Medicine

## 2022-04-05 ENCOUNTER — Other Ambulatory Visit: Payer: Self-pay | Admitting: Family Medicine

## 2022-04-05 DIAGNOSIS — E039 Hypothyroidism, unspecified: Secondary | ICD-10-CM

## 2022-04-06 ENCOUNTER — Other Ambulatory Visit: Payer: Self-pay | Admitting: Family Medicine

## 2022-04-08 ENCOUNTER — Telehealth (HOSPITAL_COMMUNITY): Payer: Self-pay | Admitting: Emergency Medicine

## 2022-04-08 NOTE — Telephone Encounter (Signed)
Reaching out to patient to offer assistance regarding upcoming cardiac imaging study; pt verbalizes understanding of appt date/time, parking situation and where to check in, pre-test NPO status and medications ordered, and verified current allergies; name and call back number provided for further questions should they arise Marchia Bond RN Navigator Cardiac Imaging [facility] [phone] office [phone] cell  Arrival 900 WC entrance Denies iv issues Daily meds, holding fioricet  Aware contrast

## 2022-04-09 ENCOUNTER — Ambulatory Visit (HOSPITAL_COMMUNITY)
Admission: RE | Admit: 2022-04-09 | Discharge: 2022-04-09 | Disposition: A | Discharge status: Home / Self Care | Payer: Medicare Other | Source: Ambulatory Visit | Attending: Cardiology | Admitting: Cardiology

## 2022-04-09 DIAGNOSIS — Z95818 Presence of other cardiac implants and grafts: Secondary | ICD-10-CM | POA: Insufficient documentation

## 2022-04-09 DIAGNOSIS — I4891 Unspecified atrial fibrillation: Secondary | ICD-10-CM

## 2022-04-09 MED ORDER — IOHEXOL 350 MG/ML SOLN
100.0000 mL | Freq: Once | INTRAVENOUS | Status: AC | PRN
  Administered 2022-04-09: 100 mL via INTRAVENOUS

## 2022-04-26 ENCOUNTER — Telehealth: Payer: Self-pay | Admitting: Cardiology

## 2022-04-26 NOTE — Telephone Encounter (Signed)
Pt c/o medication issue:  1. Name of Medication:   clopidogrel (PLAVIX) 75 MG tablet     2. How are you currently taking this medication (dosage and times per day)? As written   3. Are you having a reaction (difficulty breathing--STAT)? No   4. What is your medication issue? Pt states she spoke with Sharee Pimple, NP about stopping this medication but has not heard back. She states she has noticed some rectal bleeding and bruising since being on this medication. Please advise.

## 2022-04-27 ENCOUNTER — Encounter (HOSPITAL_COMMUNITY): Payer: Self-pay

## 2022-04-27 ENCOUNTER — Encounter: Payer: Self-pay | Admitting: Cardiology

## 2022-04-27 ENCOUNTER — Emergency Department (HOSPITAL_COMMUNITY)
Admission: EM | Admit: 2022-04-27 | Discharge: 2022-04-27 | Disposition: A | Discharge status: Home / Self Care | Payer: Medicare Other | Attending: Emergency Medicine | Admitting: Emergency Medicine

## 2022-04-27 ENCOUNTER — Emergency Department (HOSPITAL_COMMUNITY): Payer: Medicare Other

## 2022-04-27 ENCOUNTER — Other Ambulatory Visit: Payer: Self-pay

## 2022-04-27 DIAGNOSIS — I10 Essential (primary) hypertension: Secondary | ICD-10-CM | POA: Diagnosis not present

## 2022-04-27 DIAGNOSIS — Z7901 Long term (current) use of anticoagulants: Secondary | ICD-10-CM | POA: Insufficient documentation

## 2022-04-27 DIAGNOSIS — E039 Hypothyroidism, unspecified: Secondary | ICD-10-CM | POA: Insufficient documentation

## 2022-04-27 DIAGNOSIS — I4891 Unspecified atrial fibrillation: Secondary | ICD-10-CM | POA: Insufficient documentation

## 2022-04-27 LAB — BASIC METABOLIC PANEL
Anion gap: 14 (ref 5–15)
BUN: 9 mg/dL (ref 8–23)
CO2: 21 mmol/L — ABNORMAL LOW (ref 22–32)
Calcium: 8.7 mg/dL — ABNORMAL LOW (ref 8.9–10.3)
Chloride: 102 mmol/L (ref 98–111)
Creatinine, Ser: 0.75 mg/dL (ref 0.44–1.00)
GFR, Estimated: >60 mL/min (ref >60)
Glucose, Bld: 112 mg/dL — ABNORMAL HIGH (ref 70–99)
Potassium: 3.5 mmol/L (ref 3.5–5.1)
Sodium: 137 mmol/L (ref 135–145)

## 2022-04-27 LAB — MAGNESIUM: Magnesium: 1.8 mg/dL (ref 1.7–2.4)

## 2022-04-27 LAB — CBC
HCT: 42.7 % (ref 36.0–46.0)
Hemoglobin: 14.4 g/dL (ref 12.0–15.0)
MCH: 32.6 pg (ref 26.0–34.0)
MCHC: 33.7 g/dL (ref 30.0–36.0)
MCV: 96.6 fL (ref 80.0–100.0)
Platelets: 292 10*3/uL (ref 150–400)
RBC: 4.42 MIL/uL (ref 3.87–5.11)
RDW: 13 % (ref 11.5–15.5)
WBC: 10.6 10*3/uL — ABNORMAL HIGH (ref 4.0–10.5)
nRBC: 0 % (ref 0.0–0.2)

## 2022-04-27 LAB — TROPONIN I (HIGH SENSITIVITY)
Troponin I (High Sensitivity): 8 ng/L (ref <18)
Troponin I (High Sensitivity): 9 ng/L (ref <18)

## 2022-04-27 MED ORDER — DILTIAZEM HCL ER COATED BEADS 240 MG PO CP24
240.0000 mg | ORAL_CAPSULE | Freq: Every day | ORAL | Status: DC
  Filled 2022-04-27: qty 1

## 2022-04-27 MED ORDER — DILTIAZEM HCL 30 MG PO TABS
30.0000 mg | ORAL_TABLET | Freq: Once | ORAL | Status: AC
  Administered 2022-04-27: 30 mg via ORAL
  Filled 2022-04-27: qty 1

## 2022-04-27 NOTE — ED Provider Notes (Signed)
[facility] Provider Note   CSN: [id number] Arrival date & time: 04/27/22  0410     History  Chief Complaint  Patient presents with   Chest Pain    Kristy Arias is a 72 y.o. female.  HPI     This is a 72 year old female who presents with concerns for chest pain and atrial fibrillation.  She has a history of atrial fibrillation status post ablation and Watchman device placement.  She is currently on Plavix.  She states that since sometime around midnight she has been in atrial fibrillation.  She began to have some chest pain around 3 AM.  She was noted to be in A-fib with RVR.  EMS arrived and she was given IV Cardizem.  Rate has improved since that time.  Nightly she is asymptomatic.  Denies any recent illnesses. Home Medications Prior to Admission medications   Medication Sig Start Date End Date Taking? Authorizing Provider  ALPRAZolam (XANAX) 0.5 MG tablet TAKE 1 TABLET BY MOUTH 2 TIMES DAILY AS NEEDED FOR anxiety - [place] Patient taking differently: Take 0.25 mg by mouth daily as needed for anxiety or sleep. 12/09/21   Copland, Gay Filler, MD  amoxicillin (AMOXIL) 500 MG tablet Take 4 tablets (2,000 mg total) by mouth as directed. 1 HOUR PRIOR TO DENTAL APPOINTMENTS 03/15/22   Kathyrn Drown D, NP  butalbital-acetaminophen-caffeine (FIORICET) 50-325-40 MG tablet TAKE 1 TABLET BY MOUTH 2 TIMES DAILY AS NEEDED Patient taking differently: Take 1 tablet by mouth daily as needed for headache. 12/14/21   Copland, Gay Filler, MD  calcium elemental as carbonate (BARIATRIC TUMS ULTRA) 400 MG chewable tablet Chew 1 tablet by mouth 2 (two) times daily.    [provider]  cetirizine (ZYRTEC) 10 MG tablet Take 10 mg by mouth daily.    [provider]  Cholecalciferol (VITAMIN D3) 50 MCG (2000 UT) capsule Take 2,000 Units by mouth in the morning.    [provider]  clopidogrel (PLAVIX) 75 MG tablet Take 1  tablet (75 mg total) by mouth daily. START ON 2/26 AND STOP ELIQUIS ON 2/25 03/15/22   Kathyrn Drown D, NP  Cyanocobalamin (B-12 SL) Take 1,000 mcg by mouth in the morning.    [provider]  diltiazem (CARDIZEM CD) 240 MG 24 hr capsule TAKE 1 CAPSULE BY MOUTH EVERY DAY 02/25/22   Patwardhan, Manish J, MD  estradiol (VIVELLE-DOT) 0.1 MG/24HR patch APPLY 1 PATCH TWICE A WEEK 04/06/22   Copland, Gay Filler, MD  ezetimibe (ZETIA) 10 MG tablet TAKE 1 TABLET BY MOUTH EVERY DAY 02/25/22   Copland, Gay Filler, MD  hydroxypropyl methylcellulose / hypromellose (ISOPTO TEARS / GONIOVISC) 2.5 % ophthalmic solution Place 1 drop into both eyes daily as needed for dry eyes.    [provider]  levothyroxine (SYNTHROID) 50 MCG tablet TAKE 1 TABLET BY MOUTH DAILY BEFORE BREAKFAST 04/05/22   Copland, Gay Filler, MD  losartan (COZAAR) 50 MG tablet Take 1 tablet (50 mg total) by mouth daily. 03/31/22   Copland, Gay Filler, MD  Misc Natural Products (ELDERBERRY ZINC/VIT C/IMMUNE MT) Use as directed 1 tablet in the mouth or throat.    [provider]  OVER THE COUNTER MEDICATION Take 1 capsule by mouth at bedtime. Delta 8    [provider]  rosuvastatin (CRESTOR) 5 MG tablet TAKE 1 TABLET BY MOUTH 3 TIMES WEEKLY 03/31/22   Copland, Gay Filler, MD  traZODone (DESYREL) 50 MG  tablet Take 0.5-1 tablets (25-50 mg total) by mouth at bedtime as needed for sleep. 04/01/22   Copland, Gay Filler, MD  venlafaxine XR (EFFEXOR-XR) 37.5 MG 24 hr capsule TAKE 2 CAPSULES BY MOUTH DAILY WITH BREAKFAST 01/22/22   Copland, Gay Filler, MD      Allergies    Patient has no known allergies.    Review of Systems   Review of Systems  Constitutional:  Negative for fever.  Cardiovascular:  Positive for chest pain and palpitations.  All other systems reviewed and are negative.   Physical Exam Updated Vital Signs BP 134/66   Pulse 64   Temp (!) 97.5 F (36.4 C) (Oral)   Resp 17   Ht 1.562 m (5' 1.5")   Wt 54.4 kg    SpO2 98%   BMI 22.31 kg/m  Physical Exam Vitals and nursing note reviewed.  Constitutional:      Appearance: She is well-developed.  HENT:     Head: Normocephalic and atraumatic.  Eyes:     Pupils: Pupils are equal, round, and reactive to light.  Cardiovascular:     Rate and Rhythm: Tachycardia present. Rhythm irregular.     Heart sounds: Normal heart sounds.  Pulmonary:     Effort: Pulmonary effort is normal. No respiratory distress.     Breath sounds: No wheezing.  Abdominal:     General: Bowel sounds are normal.     Palpations: Abdomen is soft.  Musculoskeletal:     Cervical back: Neck supple.     Right lower leg: No edema.     Left lower leg: No edema.  Skin:    General: Skin is warm and dry.  Neurological:     General: No focal deficit present.     Mental Status: She is alert and oriented to person, place, and time.     ED Results / Procedures / Treatments   Labs (all labs ordered are listed, but only abnormal results are displayed) Labs Reviewed  BASIC METABOLIC PANEL - Abnormal; Notable for the following components:      Result Value   CO2 21 (*)    Glucose, Bld 112 (*)    Calcium 8.7 (*)    All other components within normal limits  CBC - Abnormal; Notable for the following components:   WBC 10.6 (*)    All other components within normal limits  MAGNESIUM  TROPONIN I (HIGH SENSITIVITY)  TROPONIN I (HIGH SENSITIVITY)    EKG EKG Interpretation  Date/Time:  Tuesday April 27 2022 06:45:21 EDT Ventricular Rate:  66 PR Interval:  172 QRS Duration: 84 QT Interval:  420 QTC Calculation: 440 R Axis:   -50 Text Interpretation: Sinus rhythm Left anterior fascicular block Anterior infarct, old Confirmed by Thayer Jew [phone]) on 04/27/2022 7:29:49 AM  Radiology DG Chest Port 1 View  Result Date: 04/27/2022 CLINICAL DATA:  72 year old female with history of chest pain and atrial fibrillation. EXAM: PORTABLE CHEST 1 VIEW COMPARISON:  Chest x-ray  02/04/2022. FINDINGS: Lung volumes are normal. No consolidative airspace disease. No pleural effusions. No pneumothorax. No pulmonary nodule or mass noted. Probable atrial appendage occluded device noted. Pulmonary vasculature and the cardiomediastinal silhouette are within normal limits. IMPRESSION: No radiographic evidence of acute cardiopulmonary disease. Electronically Signed   By: Vinnie Langton M.D.   On: 04/27/2022 05:12    Procedures Procedures    Medications Ordered in ED Medications  diltiazem (CARDIZEM CD) 24 hr capsule 240 mg (has no administration in time range)  diltiazem (CARDIZEM) tablet 30 mg (30 mg Oral Given 04/27/22 0500)    ED Course/ Medical Decision Making/ A&P Clinical Course as of 04/27/22 0730  Tue Apr 27, 2022  0646 Remains asymptomatic.  Appears now to be in sinus rhythm on the monitor.  Requested repeat EKG.  If repeat troponin is negative, feel patient could likely follow-up with her cardiologist. [CH]    Clinical Course User Index [CH] Dayanne Yiu, Barbette Hair, MD                             Medical Decision Making Amount and/or Complexity of Data Reviewed Labs: ordered. Radiology: ordered.  Risk Prescription drug management.   This patient presents to the ED for concern of chest pain and atrial fibrillation, this involves an extensive number of treatment options, and is a complaint that carries with it a high risk of complications and morbidity.  I considered the following differential and admission for this acute, potentially life threatening condition.  The differential diagnosis includes ACS, atrial fibrillation with RVR, metabolic derangement  MDM:    This is a 72 year old female who presents with concern for atrial fibrillation and chest discomfort.  She is nontoxic.  She is currently rate controlled although she was in the 150s to 170s with EMS.  She takes Plavix.  She is not a cardioversion candidate.  Labs obtained and reviewed.  No significant  metabolic derangements.  Initial troponin negative.  EKG without acute ischemic changes.  Patient was given her morning dose of extended release Cardizem as well as an additional 30 mg dose.  Patient converted to sinus rhythm.  She has remained asymptomatic.  Patient signed out pending repeat troponin.  (Labs, imaging, consults)  Labs: I Ordered, and personally interpreted labs.  The pertinent results include: CBC, BMP, troponin x 2, magnesium  Imaging Studies ordered: I ordered imaging studies including chest x-ray I independently visualized and interpreted imaging. I agree with the radiologist interpretation  Additional history obtained from family at bedside.  External records from outside source obtained and reviewed including prior evaluations  Cardiac Monitoring: The patient was maintained on a cardiac monitor.  If on the cardiac monitor, I personally viewed and interpreted the cardiac monitored which showed an underlying rhythm of: Atrial fibrillation  Reevaluation: After the interventions noted above, I reevaluated the patient and found that they have :resolved  Social Determinants of Health:  lives independently  Disposition: Likely discharge, pending trop  Co morbidities that complicate the patient evaluation  Past Medical History:  Diagnosis Date   Anemia    Anxiety    Arthritis    Depression    GERD (gastroesophageal reflux disease)    Glaucoma    Headache    Hyperlipidemia    Hypertension    Hypothyroidism    Presence of Watchman left atrial appendage closure device 02/04/2022   s/p LAAO with a 27 mm Watchman FLX by Dr Quentin Ore   Syndrome X, cardiac      Medicines Meds ordered this encounter  Medications   diltiazem (CARDIZEM) tablet 30 mg   diltiazem (CARDIZEM CD) 24 hr capsule 240 mg    I have reviewed the patients home medicines and have made adjustments as needed  Problem List / ED Course: Problem List Items Addressed This Visit   None Visit  Diagnoses     Atrial fibrillation with RVR    -  Primary   Relevant Medications   diltiazem (CARDIZEM)  tablet 30 mg (Completed)   diltiazem (CARDIZEM CD) 24 hr capsule 240 mg (Start on 04/27/2022 10:00 AM)                   Final Clinical Impression(s) / ED Diagnoses Final diagnoses:  Atrial fibrillation with RVR    Rx / DC Orders ED Discharge Orders     None         Merryl Hacker, MD 04/27/22 [phone]

## 2022-04-27 NOTE — ED Notes (Signed)
PT agrees with discharge, instructions and follow up were reviewed. Denies pain

## 2022-04-27 NOTE — Discharge Instructions (Addendum)
You were seen today for atrial fibrillation.  You converted spontaneously to sinus rhythm.  Follow-up with your electrophysiologist and cardiologist.  Continue your diltiazem.

## 2022-04-27 NOTE — Telephone Encounter (Signed)
See MyChart encounter and ER notes. Patient has a follow up with Oda Kilts, PA on 4/4.

## 2022-04-27 NOTE — ED Triage Notes (Signed)
Arrives [facility] from home for chest pain that started approx 4 hours prior to arrival.   Pt was in A-Fib with RVR with a rate of 130-150.  EMS administered 20 mg Cardizen IVP prior to pt's arrival to the ED.                                              Ems

## 2022-04-27 NOTE — ED Provider Notes (Signed)
Patient's delta troponin without any significant change.  No concerns for acute cardiac event.  Rate is in the 60s.  Patient received her morning dose of diltiazem converted from her atrial fibrillation with RVR.  Stable for discharge home and follow-up with cardiology.   Fredia Sorrow, MD 04/27/22 [phone]

## 2022-04-27 NOTE — Telephone Encounter (Signed)
Spoke with the patient who reports that she is feeling better. She is not currently having any bleeding. She has been scheduled for a follow up appointment on 4/4.

## 2022-04-29 ENCOUNTER — Ambulatory Visit: Payer: Medicare Other | Attending: Student | Admitting: Student

## 2022-04-29 ENCOUNTER — Encounter: Payer: Self-pay | Admitting: Student

## 2022-04-29 VITALS — BP 128/80 | HR 74 | Ht 61.5 in | Wt 120.0 lb

## 2022-04-29 DIAGNOSIS — I10 Essential (primary) hypertension: Secondary | ICD-10-CM | POA: Diagnosis not present

## 2022-04-29 DIAGNOSIS — Z95818 Presence of other cardiac implants and grafts: Secondary | ICD-10-CM

## 2022-04-29 DIAGNOSIS — I4891 Unspecified atrial fibrillation: Secondary | ICD-10-CM

## 2022-04-29 MED ORDER — DILTIAZEM HCL 30 MG PO TABS: 30.0000 mg | ORAL_TABLET | ORAL | 3 refills | Status: DC | PRN

## 2022-04-29 MED ORDER — DILTIAZEM HCL ER COATED BEADS 360 MG PO CP24: 360.0000 mg | ORAL_CAPSULE | Freq: Every day | ORAL | 3 refills | Status: DC

## 2022-04-29 NOTE — Patient Instructions (Addendum)
Medication Instructions:  1.Increase diltiazem (Cardizem CD) to 360 mg daily 2.Start diltiazem (Cardizem) 30 mg as needed for Afib or palpitations *If you need a refill on your cardiac medications before your next appointment, please call your pharmacy*  Lab Work: None ordered If you have labs (blood work) drawn today and your tests are completely normal, you will receive your results only by: [place] (if you have MyChart) OR A paper copy in the mail If you have any lab test that is abnormal or we need to change your treatment, we will call you to review the results.  Follow-Up: At [facility], you and your health needs are our priority.  As part of our continuing mission to provide you with exceptional heart care, we have created designated Provider Care Teams.  These Care Teams include your primary Cardiologist (physician) and Advanced Practice Providers (APPs -  Physician Assistants and Nurse Practitioners) who all work together to provide you with the care you need, when you need it.  Your next appointment:   08/02/2022 at 9:30 AM  Provider:   Kathyrn Drown, NP or Nell Range, PA-C

## 2022-04-29 NOTE — Progress Notes (Signed)
  Electrophysiology Office Note:   Date:  04/29/2022  ID:  Kristy Arias, DOB 14-May-1950, MRN [id number]  Primary Cardiologist: None Electrophysiologist: Vickie Epley, MD   History of Present Illness:   Kristy Arias is a 72 y.o. female with h/o AF s/p ablation 09/2021, LAAO 01/2022, HTN, HLD, and hypothyroidism  seen today for acute visit due to recurrent AF.    Patient was seen in ED earlier this week for tachy-palpitations and CP. Was found to be in AF RVR and converted with her dose of diltiazem. This is her third episode of AF since her Watchman, confirmed by Chad at home. The other episodes were shorter and resolved on their own. The main reason they came to ED this time was due to chest pressure and discomfort. HS troponin was negative. No syncope. She had been struggling with URI/allergies the week before. Denies decongestant use.   Review of systems complete and found to be negative unless listed in HPI.   Studies Reviewed:    EKG is ordered today. Personal review shows NSR at 74 bpm  Risk Assessment/Calculations:    Physical Exam:   VS:  BP 128/80   Pulse 74   Ht 5' 1.5" (1.562 m)   Wt 120 lb (54.4 kg)   SpO2 98%   BMI 22.31 kg/m    Wt Readings from Last 3 Encounters:  04/29/22 120 lb (54.4 kg)  04/27/22 120 lb (54.4 kg)  03/31/22 121 lb (54.9 kg)     GEN: Well nourished, well developed in no acute distress NECK: No JVD; No carotid bruits CARDIAC: Regular rate and rhythm, no murmurs, rubs, gallops RESPIRATORY:  Clear to auscultation without rales, wheezing or rhonchi  ABDOMEN: Soft, non-tender, non-distended EXTREMITIES:  No edema; No deformity   ASSESSMENT AND PLAN:    Paroxysmal atrial fibrillation S/p Ablation 09/2021 S/p Watchman 01/2022 Currently on Plavix and ASA daily to complete six month course post watchman.  CHA2DS2/VASc is at least 3. She responded fairly well to diltiazem, and with limited episodes since watchman/ablation, in shared decision  making we will increase dilt to 360 mg daily and also give 30 mg short acting tablets to take prn.  If has further episodes she is a good candidate for flecainide with no conduction disease and Ca score of 0 Also candidate for Tikosyn or Amiodarone.  If AF becomes more frequent, a re-do ablation is also a consideration down the road.    Chest discomfort Normal HS trop and Calcium score of 0 which is very reassuring. Likely demand ischemic   HTN Stable on current regimen   Follow up with Structural team in July as scheduled. If she has further AF would see in AF clinic to discuss Flecainide vs Tikosyn.   Signed, Shirley Friar, PA-C

## 2022-05-05 ENCOUNTER — Ambulatory Visit
Admission: RE | Admit: 2022-05-05 | Discharge: 2022-05-05 | Disposition: A | Discharge status: Home / Self Care | Payer: Medicare Other | Source: Ambulatory Visit | Attending: Family Medicine | Admitting: Family Medicine

## 2022-05-05 ENCOUNTER — Other Ambulatory Visit: Payer: Self-pay | Admitting: Family Medicine

## 2022-05-05 DIAGNOSIS — Z1231 Encounter for screening mammogram for malignant neoplasm of breast: Secondary | ICD-10-CM

## 2022-05-17 ENCOUNTER — Encounter: Payer: Self-pay | Admitting: Cardiology

## 2022-05-17 NOTE — Telephone Encounter (Signed)
Graciella Freer, PA-C  You; Frutoso Schatz,     That's ok to do.      Since she's had some more, can we go ahead and schedule her an AF visit in 1-2 weeks to check in on her and discuss the possibility of an anti-arrhythmic drug?    Thank you!

## 2022-05-21 ENCOUNTER — Other Ambulatory Visit: Payer: Self-pay | Admitting: Family Medicine

## 2022-06-03 ENCOUNTER — Ambulatory Visit (HOSPITAL_COMMUNITY)
Admission: RE | Admit: 2022-06-03 | Discharge: 2022-06-03 | Disposition: A | Discharge status: Home / Self Care | Payer: Medicare Other | Source: Ambulatory Visit | Attending: Physician Assistant | Admitting: Physician Assistant

## 2022-06-03 VITALS — BP 132/72 | HR 62 | Ht 61.5 in | Wt 119.2 lb

## 2022-06-03 DIAGNOSIS — D6859 Other primary thrombophilia: Secondary | ICD-10-CM | POA: Insufficient documentation

## 2022-06-03 DIAGNOSIS — I480 Paroxysmal atrial fibrillation: Secondary | ICD-10-CM | POA: Diagnosis not present

## 2022-06-03 DIAGNOSIS — Z79899 Other long term (current) drug therapy: Secondary | ICD-10-CM | POA: Diagnosis not present

## 2022-06-03 DIAGNOSIS — E785 Hyperlipidemia, unspecified: Secondary | ICD-10-CM | POA: Diagnosis not present

## 2022-06-03 DIAGNOSIS — R079 Chest pain, unspecified: Secondary | ICD-10-CM | POA: Diagnosis not present

## 2022-06-03 DIAGNOSIS — D6869 Other thrombophilia: Secondary | ICD-10-CM | POA: Diagnosis not present

## 2022-06-03 DIAGNOSIS — I10 Essential (primary) hypertension: Secondary | ICD-10-CM | POA: Insufficient documentation

## 2022-06-03 DIAGNOSIS — E039 Hypothyroidism, unspecified: Secondary | ICD-10-CM | POA: Insufficient documentation

## 2022-06-03 MED ORDER — FLECAINIDE ACETATE 50 MG PO TABS: 50.0000 mg | ORAL_TABLET | Freq: Two times a day (BID) | ORAL | 2 refills | Status: DC

## 2022-06-03 NOTE — Progress Notes (Signed)
Primary Care Physician: Pearline Cables, MD Primary Cardiologist: Dr Rosemary Holms Primary Electrophysiologist: Dr Lalla Brothers Referring Physician: Dr Lucius Conn is a 72 y.o. female with a history of HTN, HLD, hypothyroidism, atrial fibrillation who presents for follow up in the [facility]. Patient is on Eliquis for a CHADS2VASC score of 3. She underwent afib ablation with Dr Lalla Brothers on 10/23/21. She is also s/p Watchman implant on 02/04/22.  She has continued to have symptomatic rapid afib episodes. She was seen at the ED 04/27/22 for afib with RVR and chest pressure. She was given IV diltiazem and converted to SR. Seen by Otilio Saber 04/29/22 and her daily diltiazem was increased.   On follow up today, patient continues to have brief but frequent episodes of afib. She tracks her episodes on her Kardia mobile device. She also describes chest discomfort when she works in her yard or lays on her left side. The pain can occur with afib or when she is in SR.   Today, she denies symptoms of shortness of breath, orthopnea, PND, lower extremity edema, dizziness, presyncope, syncope, snoring, daytime somnolence, bleeding, or neurologic sequela. The patient is tolerating medications without difficulties and is otherwise without complaint today.    Atrial Fibrillation Risk Factors:  she does not have symptoms or diagnosis of sleep apnea. she does not have a history of rheumatic fever.   she has a BMI of Body mass index is 22.16 kg/m.Marland Kitchen Filed Weights   06/03/22 1002  Weight: 54.1 kg   Family History  Problem Relation Age of Onset   Cancer Mother    Hyperlipidemia Mother    Hypertension Mother    Hypertension Father    Diabetes Father    Heart disease Father    Hyperlipidemia Father    Cancer Sister    Hypertension Maternal Grandmother    Heart disease Maternal Grandmother    Diabetes Maternal Grandfather    Hyperlipidemia Maternal Grandfather     Heart disease Maternal Grandfather    Osteoporosis Paternal Grandmother    Heart disease Paternal Grandmother    Heart disease Paternal Grandfather      Atrial Fibrillation Management history:  Previous antiarrhythmic drugs: none Previous cardioversions: none Previous ablations: 10/23/21 CHADS2VASC score: 3 Anticoagulation history: Eliquis   Past Medical History:  Diagnosis Date   Anemia    Anxiety    Arthritis    Depression    GERD (gastroesophageal reflux disease)    Glaucoma    Headache    Hyperlipidemia    Hypertension    Hypothyroidism    Presence of Watchman left atrial appendage closure device 02/04/2022   s/p LAAO with a 27 mm Watchman FLX by Dr Lalla Brothers   Syndrome X, cardiac    Past Surgical History:  Procedure Laterality Date   ABDOMINAL HYSTERECTOMY     ATRIAL FIBRILLATION ABLATION N/A 10/23/2021   Procedure: ATRIAL FIBRILLATION ABLATION;  Surgeon: Lanier Prude, MD;  Location: [facility] INVASIVE CV LAB;  Service: Cardiovascular;  Laterality: N/A;   BREAST ENHANCEMENT SURGERY     BREAST SURGERY     CARDIAC CATHETERIZATION  4/14   CHOLECYSTECTOMY     COSMETIC SURGERY     DILATION AND CURETTAGE OF UTERUS  2013   EYE SURGERY     LAPAROSCOPIC CHOLECYSTECTOMY SINGLE PORT N/A 06/29/2012   Procedure: LAPAROSCOPIC CHOLECYSTECTOMY SINGLE PORT IOC ;  Surgeon: Ardeth Sportsman, MD;  Location: [facility] ORS;  Service: General;  Laterality: N/A;  LEFT ATRIAL APPENDAGE OCCLUSION N/A 02/04/2022   Procedure: LEFT ATRIAL APPENDAGE OCCLUSION;  Surgeon: Lanier Prude, MD;  Location: [facility] INVASIVE CV LAB;  Service: Cardiovascular;  Laterality: N/A;   LEFT HEART CATHETERIZATION WITH CORONARY ANGIOGRAM N/A 05/23/2012   Procedure: LEFT HEART CATHETERIZATION WITH CORONARY ANGIOGRAM;  Surgeon: [name], MD;  Location: [facility] CATH LAB;  Service: Cardiovascular;  Laterality: N/A;   TEE WITHOUT CARDIOVERSION N/A 02/04/2022   Procedure: TRANSESOPHAGEAL ECHOCARDIOGRAM (TEE);  Surgeon: Lanier Prude, MD;  Location: [facility] INVASIVE CV LAB;  Service: Cardiovascular;  Laterality: N/A;   TOTAL KNEE ARTHROPLASTY Right 08/25/2015   Procedure: RIGHT TOTAL KNEE ARTHROPLASTY;  Surgeon: Ollen Gross, MD;  Location: [facility] ORS;  Service: Orthopedics;  Laterality: Right;   TOTAL KNEE ARTHROPLASTY Left 08/09/2016   Procedure: LEFT TOTAL KNEE ARTHROPLASTY;  Surgeon: Ollen Gross, MD;  Location: [facility] ORS;  Service: Orthopedics;  Laterality: Left;    Current Outpatient Medications  Medication Sig Dispense Refill   ALPRAZolam (XANAX) 0.5 MG tablet TAKE 1 TABLET BY MOUTH 2 TIMES DAILY AS NEEDED FOR ANXIETY 60 tablet 0   amoxicillin (AMOXIL) 500 MG tablet Take 4 tablets (2,000 mg total) by mouth as directed. 1 HOUR PRIOR TO DENTAL APPOINTMENTS 12 tablet 6   BABY ASPIRIN PO Take 81 mg by mouth daily.     butalbital-acetaminophen-caffeine (FIORICET) 50-325-40 MG tablet TAKE 1 TABLET BY MOUTH 2 TIMES DAILY AS NEEDED (Patient taking differently: Take 1 tablet by mouth daily as needed for headache.) 60 tablet 0   calcium elemental as carbonate (BARIATRIC TUMS ULTRA) 400 MG chewable tablet Chew 1 tablet by mouth 2 (two) times daily.     cetirizine (ZYRTEC) 10 MG tablet Take 10 mg by mouth daily.     Cholecalciferol (VITAMIN D3) 50 MCG (2000 UT) capsule Take 2,000 Units by mouth in the morning.     clopidogrel (PLAVIX) 75 MG tablet Take 1 tablet (75 mg total) by mouth daily. START ON 2/26 AND STOP ELIQUIS ON 2/25 90 tablet 3   Cyanocobalamin (B-12 SL) Take 1,000 mcg by mouth in the morning.     diltiazem (CARDIZEM CD) 360 MG 24 hr capsule Take 1 capsule (360 mg total) by mouth daily. 90 capsule 3   diltiazem (CARDIZEM) 30 MG tablet Take 1 tablet (30 mg total) by mouth as needed (for Afib or palpitations). 45 tablet 3   estradiol (VIVELLE-DOT) 0.1 MG/24HR patch APPLY 1 PATCH TWICE A WEEK 8 patch 12   ezetimibe (ZETIA) 10 MG tablet TAKE 1 TABLET BY MOUTH EVERY DAY 90 tablet 3   hydroxypropyl methylcellulose /  hypromellose (ISOPTO TEARS / GONIOVISC) 2.5 % ophthalmic solution Place 1 drop into both eyes daily as needed for dry eyes.     levothyroxine (SYNTHROID) 50 MCG tablet TAKE 1 TABLET BY MOUTH DAILY BEFORE BREAKFAST 90 tablet 1   losartan (COZAAR) 50 MG tablet Take 1 tablet (50 mg total) by mouth daily. 90 tablet 3   Misc Natural Products (ELDERBERRY ZINC/VIT C/IMMUNE MT) Use as directed 1 tablet in the mouth or throat as needed.     OVER THE COUNTER MEDICATION Take 1 capsule by mouth at bedtime. Delta 8     rosuvastatin (CRESTOR) 5 MG tablet TAKE 1 TABLET BY MOUTH 3 TIMES WEEKLY 90 tablet 3   traZODone (DESYREL) 50 MG tablet Take 0.5-1 tablets (25-50 mg total) by mouth at bedtime as needed for sleep. 30 tablet 3   venlafaxine XR (EFFEXOR-XR) 37.5 MG 24 hr capsule  TAKE 2 CAPSULES BY MOUTH DAILY WITH BREAKFAST 60 capsule 2   No current facility-administered medications for this encounter.    No Known Allergies  Social History   Socioeconomic History   Marital status: Married    Spouse name: Not on file   Number of children: 2   Years of education: 17   Highest education level: Not on file  Occupational History   Not on file  Tobacco Use   Smoking status: Never   Smokeless tobacco: Never   Tobacco comments:    Never smoke 11/20/21  Vaping Use   Vaping Use: Never used  Substance and Sexual Activity   Alcohol use: No   Drug use: No   Sexual activity: Yes    Birth control/protection: Surgical  Other Topics Concern   Not on file  Social History Narrative   Right handed   Drinks caffeine prn   Two story home   Social Determinants of Health   Financial Resource Strain: Low Risk  (12/09/2021)   Overall Financial Resource Strain (CARDIA)    Difficulty of Paying Living Expenses: Not hard at all  Food Insecurity: No Food Insecurity (12/09/2021)   Hunger Vital Sign    Worried About Running Out of Food in the Last Year: Never true    Ran Out of Food in the Last Year: Never true   Transportation Needs: No Transportation Needs (12/09/2021)   PRAPARE - Administrator, Civil Service (Medical): No    Lack of Transportation (Non-Medical): No  Physical Activity: Inactive (12/09/2021)   Exercise Vital Sign    Days of Exercise per Week: 0 days    Minutes of Exercise per Session: 0 min  Stress: No Stress Concern Present (12/09/2021)   [employer] of Occupational Health - Occupational Stress Questionnaire    Feeling of Stress : Not at all  Social Connections: Not on file  Intimate Partner Violence: Not At Risk (12/09/2021)   Humiliation, Afraid, Rape, and Kick questionnaire    Fear of Current or Ex-Partner: No    Emotionally Abused: No    Physically Abused: No    Sexually Abused: No     ROS- All systems are reviewed and negative except as per the HPI above.  Physical Exam: Vitals:   06/03/22 1002  BP: 132/72  Pulse: 62  Weight: 54.1 kg  Height: 5' 1.5" (1.562 m)     GEN- The patient is a well appearing female, alert and oriented x 3 today.   HEENT-head normocephalic, atraumatic, sclera clear, conjunctiva pink, hearing intact, trachea midline. Lungs- Clear to ausculation bilaterally, normal work of breathing Heart- Regular rate and rhythm, no murmurs, rubs or gallops  GI- soft, NT, ND, + BS Extremities- no clubbing, cyanosis, or edema MS- no significant deformity or atrophy Skin- no rash or lesion Psych- euthymic mood, full affect Neuro- strength and sensation are intact   Wt Readings from Last 3 Encounters:  06/03/22 54.1 kg  04/29/22 54.4 kg  04/27/22 54.4 kg    EKG today demonstrates  SR Vent. rate 62 BPM PR interval 184 ms QRS duration 60 ms QT/QTcB 408/414 ms  Echo 10/02/21 demonstrated   1. Left ventricular ejection fraction, by estimation, is 60 to 65%. The  left ventricle has normal function. The left ventricle has no regional  wall motion abnormalities. Left ventricular diastolic parameters are  consistent with Grade  I diastolic dysfunction (impaired relaxation).   2. Right ventricular systolic function is normal. The right ventricular  size is normal. There is normal pulmonary artery systolic pressure.   3. Left atrial size was moderately dilated.   4. The mitral valve is normal in structure. No evidence of mitral valve  regurgitation. No evidence of mitral stenosis.   5. The aortic valve is tricuspid. Aortic valve regurgitation is not  visualized. No aortic stenosis is present.   6. The inferior vena cava is normal in size with greater than 50%  respiratory variability, suggesting right atrial pressure of 3 mmHg.   Epic records are reviewed at length today  CHA2DS2-VASc Score = 3  The patient's score is based upon: CHF History: 0 HTN History: 1 Diabetes History: 0 Stroke History: 0 Vascular Disease History: 0 Age Score: 1 Gender Score: 1       ASSESSMENT AND PLAN: 1. Paroxysmal Atrial Fibrillation (ICD10:  I48.0) The patient's CHA2DS2-VASc score is 3, indicating a 3.2% annual risk of stroke.   S/p afib ablation 10/23/21 Patient has continued to have rapid symptomatic afib since ablation.  We discussed rhythm control options today. Will start flecainide 50 mg BID. Will have her return for ECG next week, will then arrange TST after.  Continue diltiazem 360 mg daily with 30 mg PRN for heart racing. Kardia mobile for home monitoring.   2. Secondary Hypercoagulable State (ICD10:  D68.69) The patient is at significant risk for stroke/thromboembolism based upon her CHA2DS2-VASc Score of 3.  S/p Watchman implant 02/04/22. On ASA and Plavix.  3. HTN Stable, no changes today.  4. Chest discomfort Unclear etiology. Can be with activity or at rest. Also positional.  CAC score of 0 on CT. Appears to be independent of her afib episodes. Don't think it would be a complication of ablation or Watchman this far out.  ? Musculoskeletal.   Follow up in the AF clinic next week for ECG.    Jorja Loa PA-C Afib Clinic [facility] [address] [place], [place] [address] [phone] 06/03/2022 10:20 AM

## 2022-06-10 ENCOUNTER — Other Ambulatory Visit: Payer: Self-pay | Admitting: Family Medicine

## 2022-06-10 DIAGNOSIS — F341 Dysthymic disorder: Secondary | ICD-10-CM

## 2022-06-11 ENCOUNTER — Ambulatory Visit (HOSPITAL_COMMUNITY)
Admission: RE | Admit: 2022-06-11 | Discharge: 2022-06-11 | Disposition: A | Discharge status: Home / Self Care | Payer: Medicare Other | Source: Ambulatory Visit | Attending: Physician Assistant | Admitting: Physician Assistant

## 2022-06-11 DIAGNOSIS — R9431 Abnormal electrocardiogram [ECG] [EKG]: Secondary | ICD-10-CM | POA: Diagnosis not present

## 2022-06-11 DIAGNOSIS — I480 Paroxysmal atrial fibrillation: Secondary | ICD-10-CM | POA: Diagnosis present

## 2022-06-11 NOTE — Progress Notes (Signed)
Patient returns for ECG after starting flecainide. ECG shows:  SR Vent. rate 60 BPM PR interval 148 ms QRS duration 66 ms QT/QTcB 412/412 ms  Patient reports no adverse effects with flecainide, no further afib. Follow up with structural heart team and Dr Rosemary Holms as scheduled. She will need TST if she stays on flecainide to look for flecainide induced arrhythmias.

## 2022-06-14 ENCOUNTER — Other Ambulatory Visit: Payer: Self-pay | Admitting: Family Medicine

## 2022-06-14 DIAGNOSIS — Z760 Encounter for issue of repeat prescription: Secondary | ICD-10-CM

## 2022-06-29 ENCOUNTER — Encounter (INDEPENDENT_AMBULATORY_CARE_PROVIDER_SITE_OTHER): Payer: Medicare Other | Admitting: Family Medicine

## 2022-06-29 DIAGNOSIS — F5101 Primary insomnia: Secondary | ICD-10-CM

## 2022-06-29 NOTE — Telephone Encounter (Signed)
Please see the MyChart message reply(ies) for my assessment and plan.  The patient gave consent for this Medical Advice Message and is aware that it may result in a bill to their insurance company as well as the possibility that this may result in a co-payment or deductible. They are an established patient, but are not seeking medical advice exclusively about a problem treated during an in person or video visit in the last 7 days. I did not recommend an in person or video visit within 7 days of my reply.  I spent a total of 10 minutes cumulative time within 7 days through MyChart messaging Shantese Raven, MD  

## 2022-06-30 ENCOUNTER — Encounter: Payer: Self-pay | Admitting: Cardiology

## 2022-07-02 ENCOUNTER — Encounter: Payer: Self-pay | Admitting: Family Medicine

## 2022-07-02 ENCOUNTER — Other Ambulatory Visit: Payer: Self-pay | Admitting: Family Medicine

## 2022-07-02 DIAGNOSIS — E039 Hypothyroidism, unspecified: Secondary | ICD-10-CM

## 2022-07-02 DIAGNOSIS — F5101 Primary insomnia: Secondary | ICD-10-CM

## 2022-07-02 MED ORDER — TRAZODONE HCL 50 MG PO TABS
75.0000 mg | ORAL_TABLET | Freq: Every evening | ORAL | 3 refills | Status: DC | PRN
Start: 2022-07-02 — End: 2022-09-01

## 2022-07-30 NOTE — Progress Notes (Unsigned)
[facility]                                     Cardiology Office Note:    Date:  08/03/2022   ID:  [name], DOB [birth year], MRN [id number]  PCP:  Pearline Cables, MD  [facility] Cardiologist: Dr. Rosemary Holms, MD  [facility] Electrophysiologist:  Lanier Prude, MD   Referring MD: Pearline Cables, MD   Chief Complaint  Patient presents with   Follow-up    6 month s/p LAAO   History of Present Illness:    TILLIE VANSANT is a 72 y.o. female with a hx of HTN, HLD, hypothyroidism, and paroxysmal atrial fibrillation s/p LAAO closure with Watchman  FLX device who is being seen today for 6 month follow up.    Ms. Janisse has been followed by the Atrial Fibrillation Clinic and Dr. Rosemary Holms for her cardiology care. She was initially seen by Dr. Lalla Brothers due to increased frequency of AF. She underwent atrial fibrillation ablation 10/23/21. Since that time she has been feeling well although on last evaluation although she "possible AF" notifications on her Kardia mobile device. Strips were reviewed by the AF Clinic which showed intermittent AF and NSR with PACs although he was noted to be back in SR on last follow up. She has significant arthritis which is typically controlled with NSAIDs however due to the need for anticoagulation, she has been unable to take this and wishes to proceed with Watchman in hopes to avoid long term AC.    The patient was seen as an outpatient and thought to be a poor candidate for continued anticoagulation due to severe osteoarthritis requiring NSAIDs. She is now s/p LAAO closure with Watchman FLX device. She was restarted on Eliquis and Aspirin 81mg  PO daily was added to her regimen and was transitioned to DAPT to complete 6 months post implant. Post op imaging with no device leak or thrombus.   Today she is here alone and reports that she has been doing very well with no issues of chest pain, SOB, LE edema, orthopnea, PND, dizziness, or  syncope. No bleeding in stool or urine. She was recently having issues with breakthrough AF and was started on flecainide with no further issues. She has remained in SR on subsequent follow up. If she remains on flecainide, noted that she will need follow up TST for monitoring. Plan to send to Dr. Rosemary Holms.   Past Medical History:  Diagnosis Date   Anemia    Anxiety    Arthritis    Depression    GERD (gastroesophageal reflux disease)    Glaucoma    Headache    Hyperlipidemia    Hypertension    Hypothyroidism    Presence of Watchman left atrial appendage closure device 02/04/2022   s/p LAAO with a 27 mm Watchman FLX by Dr Lalla Brothers   Syndrome Juliann Pares, cardiac     Past Surgical History:  Procedure Laterality Date   ABDOMINAL HYSTERECTOMY     ATRIAL FIBRILLATION ABLATION N/A 10/23/2021   Procedure: ATRIAL FIBRILLATION ABLATION;  Surgeon: Lanier Prude, MD;  Location: [facility] INVASIVE CV LAB;  Service: Cardiovascular;  Laterality: N/A;   BREAST ENHANCEMENT SURGERY     BREAST SURGERY     CARDIAC CATHETERIZATION  4/14   CHOLECYSTECTOMY     COSMETIC SURGERY     DILATION AND CURETTAGE OF  UTERUS  2013   EYE SURGERY     LAPAROSCOPIC CHOLECYSTECTOMY SINGLE PORT N/A 06/29/2012   Procedure: LAPAROSCOPIC CHOLECYSTECTOMY SINGLE PORT IOC ;  Surgeon: Ardeth Sportsman, MD;  Location: [facility] ORS;  Service: General;  Laterality: N/A;   LEFT ATRIAL APPENDAGE OCCLUSION N/A 02/04/2022   Procedure: LEFT ATRIAL APPENDAGE OCCLUSION;  Surgeon: Lanier Prude, MD;  Location: [facility] INVASIVE CV LAB;  Service: Cardiovascular;  Laterality: N/A;   LEFT HEART CATHETERIZATION WITH CORONARY ANGIOGRAM N/A 05/23/2012   Procedure: LEFT HEART CATHETERIZATION WITH CORONARY ANGIOGRAM;  Surgeon: [name], MD;  Location: [facility] CATH LAB;  Service: Cardiovascular;  Laterality: N/A;   TEE WITHOUT CARDIOVERSION N/A 02/04/2022   Procedure: TRANSESOPHAGEAL ECHOCARDIOGRAM (TEE);  Surgeon: Lanier Prude, MD;  Location: [facility] INVASIVE CV  LAB;  Service: Cardiovascular;  Laterality: N/A;   TOTAL KNEE ARTHROPLASTY Right 08/25/2015   Procedure: RIGHT TOTAL KNEE ARTHROPLASTY;  Surgeon: Ollen Gross, MD;  Location: [facility] ORS;  Service: Orthopedics;  Laterality: Right;   TOTAL KNEE ARTHROPLASTY Left 08/09/2016   Procedure: LEFT TOTAL KNEE ARTHROPLASTY;  Surgeon: Ollen Gross, MD;  Location: [facility] ORS;  Service: Orthopedics;  Laterality: Left;    Current Medications: Current Meds  Medication Sig   ALPRAZolam (XANAX) 0.5 MG tablet TAKE 1 TABLET BY MOUTH 2 TIMES DAILY AS NEEDED FOR ANXIETY   butalbital-acetaminophen-caffeine (FIORICET) 50-325-40 MG tablet TAKE 1 TABLET BY MOUTH 2 TIMES DAILY AS NEEDED   calcium elemental as carbonate (BARIATRIC TUMS ULTRA) 400 MG chewable tablet Chew 1 tablet by mouth 2 (two) times daily.   cetirizine (ZYRTEC) 10 MG tablet Take 10 mg by mouth daily.   Cholecalciferol (VITAMIN D3) 50 MCG (2000 UT) capsule Take 2,000 Units by mouth in the morning.   Cyanocobalamin (B-12 SL) Take 1,000 mcg by mouth in the morning.   diclofenac (VOLTAREN) 75 MG EC tablet Take 75 mg by mouth 2 (two) times daily.   diltiazem (CARDIZEM CD) 360 MG 24 hr capsule Take 1 capsule (360 mg total) by mouth daily.   diltiazem (CARDIZEM) 30 MG tablet Take 1 tablet (30 mg total) by mouth as needed (for Afib or palpitations).   estradiol (VIVELLE-DOT) 0.1 MG/24HR patch APPLY 1 PATCH TWICE A WEEK   ezetimibe (ZETIA) 10 MG tablet TAKE 1 TABLET BY MOUTH EVERY DAY   flecainide (TAMBOCOR) 50 MG tablet Take 1 tablet (50 mg total) by mouth 2 (two) times daily.   hydroxypropyl methylcellulose / hypromellose (ISOPTO TEARS / GONIOVISC) 2.5 % ophthalmic solution Place 1 drop into both eyes daily as needed for dry eyes.   levothyroxine (SYNTHROID) 50 MCG tablet Take 1 tablet (50 mcg total) by mouth daily before breakfast.   losartan (COZAAR) 50 MG tablet Take 1 tablet (50 mg total) by mouth daily.   Misc Natural Products (ELDERBERRY ZINC/VIT C/IMMUNE  MT) Use as directed 1 tablet in the mouth or throat as needed.   OVER THE COUNTER MEDICATION Take 1 capsule by mouth at bedtime. Delta 8   rosuvastatin (CRESTOR) 5 MG tablet TAKE 1 TABLET BY MOUTH 3 TIMES WEEKLY   traZODone (DESYREL) 50 MG tablet Take 1.5 tablets (75 mg total) by mouth at bedtime as needed for sleep.   venlafaxine XR (EFFEXOR-XR) 37.5 MG 24 hr capsule Take 2 capsules (75 mg total) by mouth daily with breakfast.   [DISCONTINUED] amoxicillin (AMOXIL) 500 MG tablet Take 4 tablets (2,000 mg total) by mouth as directed. 1 HOUR PRIOR TO DENTAL APPOINTMENTS   [DISCONTINUED] BABY ASPIRIN PO  Take 81 mg by mouth daily.     Allergies:   Patient has no known allergies.   Social History   Socioeconomic History   Marital status: Married    Spouse name: Not on file   Number of children: 2   Years of education: 17   Highest education level: Not on file  Occupational History   Not on file  Tobacco Use   Smoking status: Never   Smokeless tobacco: Never   Tobacco comments:    Never smoke 11/20/21  Vaping Use   Vaping Use: Never used  Substance and Sexual Activity   Alcohol use: No   Drug use: No   Sexual activity: Yes    Birth control/protection: Surgical  Other Topics Concern   Not on file  Social History Narrative   Right handed   Drinks caffeine prn   Two story home   Social Determinants of Health   Financial Resource Strain: Low Risk  (12/09/2021)   Overall Financial Resource Strain (CARDIA)    Difficulty of Paying Living Expenses: Not hard at all  Food Insecurity: No Food Insecurity (12/09/2021)   Hunger Vital Sign    Worried About Running Out of Food in the Last Year: Never true    Ran Out of Food in the Last Year: Never true  Transportation Needs: No Transportation Needs (12/09/2021)   PRAPARE - Administrator, Civil Service (Medical): No    Lack of Transportation (Non-Medical): No  Physical Activity: Inactive (12/09/2021)   Exercise Vital Sign     Days of Exercise per Week: 0 days    Minutes of Exercise per Session: 0 min  Stress: No Stress Concern Present (12/09/2021)   [employer] of Occupational Health - Occupational Stress Questionnaire    Feeling of Stress : Not at all  Social Connections: Not on file     Family History: The patient's family history includes Cancer in her mother and sister; Diabetes in her father and maternal grandfather; Heart disease in her father, maternal grandfather, maternal grandmother, paternal grandfather, and paternal grandmother; Hyperlipidemia in her father, maternal grandfather, and mother; Hypertension in her father, maternal grandmother, and mother; Osteoporosis in her paternal grandmother.  ROS:   Please see the history of present illness.    All other systems reviewed and are negative.  EKGs/Labs/Other Studies Reviewed:    The following studies were reviewed today:   Cardiac Studies & Procedures       ECHOCARDIOGRAM  ECHOCARDIOGRAM COMPLETE 10/02/2021  Narrative ECHOCARDIOGRAM REPORT    Patient Name:   THANNA HOPGOOD Date of Exam: 10/02/2021 Medical Rec #:  [id number]      Height:       61.0 in Accession #:    [id number]     Weight:       120.6 lb Date of Birth:  02-18-1950       BSA:          1.523 m Patient Age:    71 years       BP:           116/74 mmHg Patient Gender: F              HR:           66 bpm. Exam Location:  [address]  Procedure: 2D Echo, 3D Echo, Cardiac Doppler and Color Doppler  Indications:    I48.91 Atrial Fibrillation  History:        Patient has prior history  of Echocardiogram examinations, most recent 08/12/2021. Arrythmias:Atrial Fibrillation, Signs/Symptoms:Shortness of Breath and Murmur; Risk Factors:Hypertension, Family History of Coronary Artery Disease and Dyslipidemia. Pre-Op Eval for Watchman Implant, Bilateral Breast Enhancement, Cardiac Syndrome X.  Sonographer:    Farrel Conners RDCS Referring Phys: [id number] Rossie Muskrat  LAMBERT  IMPRESSIONS   1. Left ventricular ejection fraction, by estimation, is 60 to 65%. The left ventricle has normal function. The left ventricle has no regional wall motion abnormalities. Left ventricular diastolic parameters are consistent with Grade I diastolic dysfunction (impaired relaxation). 2. Right ventricular systolic function is normal. The right ventricular size is normal. There is normal pulmonary artery systolic pressure. 3. Left atrial size was moderately dilated. 4. The mitral valve is normal in structure. No evidence of mitral valve regurgitation. No evidence of mitral stenosis. 5. The aortic valve is tricuspid. Aortic valve regurgitation is not visualized. No aortic stenosis is present. 6. The inferior vena cava is normal in size with greater than 50% respiratory variability, suggesting right atrial pressure of 3 mmHg.  FINDINGS Left Ventricle: Left ventricular ejection fraction, by estimation, is 60 to 65%. The left ventricle has normal function. The left ventricle has no regional wall motion abnormalities. The left ventricular internal cavity size was normal in size. There is no left ventricular hypertrophy. Left ventricular diastolic parameters are consistent with Grade I diastolic dysfunction (impaired relaxation). Normal left ventricular filling pressure.  Right Ventricle: The right ventricular size is normal. No increase in right ventricular wall thickness. Right ventricular systolic function is normal. There is normal pulmonary artery systolic pressure. The tricuspid regurgitant velocity is 2.54 m/s, and with an assumed right atrial pressure of 3 mmHg, the estimated right ventricular systolic pressure is 28.8 mmHg.  Left Atrium: Left atrial size was moderately dilated.  Right Atrium: Right atrial size was normal in size.  Pericardium: There is no evidence of pericardial effusion.  Mitral Valve: The mitral valve is normal in structure. No evidence of mitral valve  regurgitation. No evidence of mitral valve stenosis.  Tricuspid Valve: The tricuspid valve is normal in structure. Tricuspid valve regurgitation is mild . No evidence of tricuspid stenosis.  Aortic Valve: The aortic valve is tricuspid. Aortic valve regurgitation is not visualized. No aortic stenosis is present.  Pulmonic Valve: The pulmonic valve was normal in structure. Pulmonic valve regurgitation is not visualized. No evidence of pulmonic stenosis.  Aorta: The aortic root is normal in size and structure.  Venous: The inferior vena cava is normal in size with greater than 50% respiratory variability, suggesting right atrial pressure of 3 mmHg.  IAS/Shunts: No atrial level shunt detected by color flow Doppler.   LEFT VENTRICLE PLAX 2D LVIDd:         3.80 cm   Diastology LVIDs:         2.40 cm   LV e' medial:    10.10 cm/s LV PW:         0.80 cm   LV E/e' medial:  6.2 LV IVS:        0.90 cm   LV e' lateral:   12.20 cm/s LVOT diam:     2.10 cm   LV E/e' lateral: 5.2 LV SV:         57 LV SV Index:   38 LVOT Area:     3.46 cm  3D Volume EF: 3D EF:        68 % LV EDV:       73 ml LV ESV:  23 ml LV SV:        50 ml  RIGHT VENTRICLE RV Basal diam:  3.70 cm RV S prime:     14.40 cm/s TAPSE (M-mode): 2.2 cm  LEFT ATRIUM             Index        RIGHT ATRIUM           Index LA diam:        2.80 cm 1.84 cm/m   RA Area:     16.20 cm LA Vol (A2C):   31.8 ml 20.87 ml/m  RA Volume:   44.90 ml  29.47 ml/m LA Vol (A4C):   56.0 ml 36.76 ml/m LA Biplane Vol: 42.3 ml 27.77 ml/m AORTIC VALVE LVOT Vmax:   95.10 cm/s LVOT Vmean:  58.150 cm/s LVOT VTI:    0.166 m  AORTA Ao Root diam: 2.70 cm Ao Asc diam:  3.20 cm  MITRAL VALVE               TRICUSPID VALVE MV Area (PHT)  cm         TR Peak grad:   25.8 mmHg MV Decel Time: 327 msec    TR Vmax:        254.00 cm/s MV E velocity: 62.90 cm/s MV A velocity: 83.80 cm/s  SHUNTS MV E/A ratio:  0.75        Systemic VTI:  0.17  m Systemic Diam: 2.10 cm  Chilton Si MD Electronically signed by Chilton Si MD Signature Date/Time: 10/02/2021/11:13:58 PM    Final   TEE  ECHO TEE 02/04/2022  Narrative TRANSESOPHOGEAL ECHO REPORT    Patient Name:   BRISEIS HEY Date of Exam: 02/04/2022 Medical Rec #:  [id number]      Height:       61.0 in Accession #:    [id number]     Weight:       120.0 lb Date of Birth:  Jan 19, 1951       BSA:          1.520 m Patient Age:    71 years       BP:           142/75 mmHg Patient Gender: F              HR:           90 bpm. Exam Location:  Inpatient  Procedure: Transesophageal Echo, 3D Echo, Color Doppler and Cardiac Doppler  Indications:     I48.1 Persistent atrial fibrillation  History:         Patient has prior history of Echocardiogram examinations, most recent 10/02/2021. Arrythmias:Atrial Fibrillation; Risk Factors:Hypertension and Dyslipidemia.  Sonographer:     Irving Burton Senior RDCS Referring Phys:  [id number] Lanier Prude Diagnosing Phys: Riley Lam MD   Sonographer Comments: Watchman Procedure   PROCEDURE: After discussion of the risks and benefits of a TEE, an informed consent was obtained from the patient. The transesophogeal probe was passed without difficulty through the esophogus of the patient. Sedation performed by different physician. The patient was monitored while under deep sedation. The patient developed no complications during the procedure.  IMPRESSIONS   1. Interventional TEE for LAAO procedure. Prior to procedure, there was no evidence of left atrial appendage thrombus. Maximal left atrial dimension was 21 mm, with sufficient depth for a 27 mm Watchman FLX device. Transeptal puncture performed with no complication. Placement of the device was optimized with no  peri-device leak and with an average compression of 22%. There was a moderate mitral should with no device-valve interactions. Post procedure there was a left to right  shunt. No change in pericardial effusion size before and after case.. Left atrial size was mild to moderately dilated. No left atrial/left atrial appendage thrombus was detected. 2. Left ventricular ejection fraction, by estimation, is 60 to 65%. The left ventricle has normal function. 3. Right ventricular systolic function is normal. The right ventricular size is normal. 4. Right atrial size was mildly dilated. 5. Evidence of atrial level shunting detected by color flow Doppler. 6. The aortic valve is tricuspid. Aortic valve regurgitation is not visualized. 7. A small pericardial effusion is present. The pericardial effusion is posterior to the left ventricle. 8. The mitral valve is grossly normal. No evidence of mitral valve regurgitation.  FINDINGS Left Ventricle: Left ventricular ejection fraction, by estimation, is 60 to 65%. The left ventricle has normal function. The left ventricular internal cavity size was normal in size.  Right Ventricle: The right ventricular size is normal. No increase in right ventricular wall thickness. Right ventricular systolic function is normal.  Left Atrium: Interventional TEE for LAAO procedure. Prior to procedure, there was no evidence of left atrial appendage thrombus. Maximal left atrial dimension was 21 mm, with sufficient depth for a 27 mm Watchman FLX device. Transeptal puncture performed with no complication. Placement of the device was optimized with no peri-device leak and with an average compression of 22%. There was a moderate mitral should with no device-valve interactions. Post procedure there was a left to right shunt. No change in pericardial effusion size before and after case. Left atrial size was mild to moderately dilated. No left atrial/left atrial appendage thrombus was detected.  Right Atrium: Right atrial size was mildly dilated.  Pericardium: A small pericardial effusion is present. The pericardial effusion is posterior to the left  ventricle.  Mitral Valve: The mitral valve is grossly normal. No evidence of mitral valve regurgitation.  Tricuspid Valve: The tricuspid valve is normal in structure. Tricuspid valve regurgitation is not demonstrated. No evidence of tricuspid stenosis.  Aortic Valve: The aortic valve is tricuspid. Aortic valve regurgitation is not visualized.  Pulmonic Valve: The pulmonic valve was normal in structure. Pulmonic valve regurgitation is not visualized.  Aorta: The aortic root and ascending aorta are structurally normal, with no evidence of dilitation.  IAS/Shunts: Evidence of atrial level shunting detected by color flow Doppler.  Additional Comments: Spectral Doppler performed.  Riley Lam MD Electronically signed by Riley Lam MD Signature Date/Time: 02/04/2022/11:30:42 AM    Final    CT SCANS  CT CARDIAC SCORING (SELF PAY ONLY) 06/14/2019  Narrative CLINICAL DATA:  Hyperlipidemia  EXAM: CT CARDIAC CORONARY ARTERY CALCIUM SCORE  TECHNIQUE: Non-contrast imaging through the heart was performed using prospective ECG gating. Image post processing was performed on an independent workstation, allowing for quantitative analysis of the heart and coronary arteries. Note that this exam targets the heart and the chest was not imaged in its entirety.  COMPARISON:  None.  FINDINGS: CORONARY CALCIUM SCORES:  Left Main: 0  LAD: 0  LCx: 0  RCA: 0  Total Agatston Score: 0  MESA database percentile: 0  AORTA MEASUREMENTS:  Ascending Aorta: 32 mm  Descending Aorta: 22 mm  OTHER FINDINGS:  Heart is normal size. No adenopathy. Scarring in the right middle lobe. No confluent opacities or effusions. Imaging into the upper abdomen shows no acute findings. Bilateral breast implants noted.  Heart and mediastinal contours are within normal limits. No focal opacities or effusions. No acute bony abnormality.  IMPRESSION: No visible coronary artery  calcifications. Total coronary calcium score of 0.  No acute or significant extracardiac abnormality.   Electronically Signed By: Charlett Nose M.D. On: 06/14/2019 15:32          EKG:  EKG is not ordered today.    Recent Labs: 03/31/2022: ALT 8; TSH 1.75 04/27/2022: BUN 9; Creatinine, Ser 0.75; Hemoglobin 14.4; Magnesium 1.8; Platelets 292; Potassium 3.5; Sodium 137   Recent Lipid Panel    Component Value Date/Time   CHOL 189 03/31/2022 1125   TRIG 142.0 03/31/2022 1125   HDL 73.90 03/31/2022 1125   CHOLHDL 3 03/31/2022 1125   VLDL 28.4 03/31/2022 1125   LDLCALC 87 03/31/2022 1125   Risk Assessment/Calculations:    CHA2DS2-VASc Score = 3 [CHF History: 0, HTN History: 1, Diabetes History: 0, Stroke History: 0, Vascular Disease History: 0, Age Score: 1, Gender Score: 1].  Therefore, the patient's annual risk of stroke is 3.2 %.      Physical Exam:    VS:  BP 124/70   Pulse 62   Ht 5' 1.5" (1.562 m)   Wt 123 lb (55.8 kg)   SpO2 97%   BMI 22.86 kg/m     Wt Readings from Last 3 Encounters:  08/02/22 123 lb (55.8 kg)  06/03/22 119 lb 3.2 oz (54.1 kg)  04/29/22 120 lb (54.4 kg)    General: Well developed, well nourished, NAD Lungs:Clear to ausculation bilaterally. No wheezes, rales, or rhonchi. Breathing is unlabored. Cardiovascular: RRR with S1 S2. No murmurs Extremities: No edema. No clubbing or cyanosis. DP/PT pulses 2+ bilaterally Neuro: Alert and oriented. No focal deficits. No facial asymmetry. MAE spontaneously. Psych: Responds to questions appropriately with normal affect.    ASSESSMENT/PLAN:    Paroxysmal Atrial Fibrillation: s/p Watchman FLX implant 02/04/22 and has done very well. She was initially continued on Eliquis and transitioned to DAPT through 6 months. She stopped this 07/06/22. She no longer requires dental SBE. She has been seen for breakthrough AF episodes and has been started on flecainide by the AF Clinic with symptom improvement. If she remains on  this, she will need TST for monitoring. Will send to Dr. Rosemary Holms for review.     HTN: Stable with no changes needed today   Medication Adjustments/Labs and Tests Ordered: Current medicines are reviewed at length with the patient today.  Concerns regarding medicines are outlined above.  No orders of the defined types were placed in this encounter.  No orders of the defined types were placed in this encounter.   Patient Instructions  Medication Instructions:  Your physician has recommended you make the following change in your medication:  STOP ASPIRIN  STOP AMOXICILLIN   *If you need a refill on your cardiac medications before your next appointment, please call your pharmacy*   Lab Work: NONE If you have labs (blood work) drawn today and your tests are completely normal, you will receive your results only by: MyChart Message (if you have MyChart) OR A paper copy in the mail If you have any lab test that is abnormal or we need to change your treatment, we will call you to review the results.   Testing/Procedures: NONE   Follow-Up: At [facility], you and your health needs are our priority.  As part of our continuing mission to provide you with exceptional heart care, we have created designated  Provider Care Teams.  These Care Teams include your primary Cardiologist (physician) and Advanced Practice Providers (APPs -  Physician Assistants and Nurse Practitioners) who all work together to provide you with the care you need, when you need it.  We recommend signing up for the patient portal called "MyChart".  Sign up information is provided on this After Visit Summary.  MyChart is used to connect with patients for Virtual Visits (Telemedicine).  Patients are able to view lab/test results, encounter notes, upcoming appointments, etc.  Non-urgent messages can be sent to your provider as well.   To learn more about what you can do with MyChart, go to [email or web address].     Your next appointment:   KEEP SCHEDULED FOLLOW-UP WITH DR. PATWARDHAN   Signed, Georgie Chard, NP  08/03/2022 8:49 AM    [facility]

## 2022-08-02 ENCOUNTER — Ambulatory Visit: Payer: Medicare Other | Attending: Cardiology | Admitting: Cardiology

## 2022-08-02 VITALS — BP 124/70 | HR 62 | Ht 61.5 in | Wt 123.0 lb

## 2022-08-02 DIAGNOSIS — D6869 Other thrombophilia: Secondary | ICD-10-CM | POA: Diagnosis not present

## 2022-08-02 DIAGNOSIS — Z95818 Presence of other cardiac implants and grafts: Secondary | ICD-10-CM | POA: Diagnosis not present

## 2022-08-02 DIAGNOSIS — I10 Essential (primary) hypertension: Secondary | ICD-10-CM

## 2022-08-02 DIAGNOSIS — I480 Paroxysmal atrial fibrillation: Secondary | ICD-10-CM | POA: Diagnosis not present

## 2022-08-02 NOTE — Patient Instructions (Signed)
Medication Instructions:  Your physician has recommended you make the following change in your medication:  STOP ASPIRIN  STOP AMOXICILLIN   *If you need a refill on your cardiac medications before your next appointment, please call your pharmacy*   Lab Work: NONE If you have labs (blood work) drawn today and your tests are completely normal, you will receive your results only by: MyChart Message (if you have MyChart) OR A paper copy in the mail If you have any lab test that is abnormal or we need to change your treatment, we will call you to review the results.   Testing/Procedures: NONE   Follow-Up: At [facility], you and your health needs are our priority.  As part of our continuing mission to provide you with exceptional heart care, we have created designated Provider Care Teams.  These Care Teams include your primary Cardiologist (physician) and Advanced Practice Providers (APPs -  Physician Assistants and Nurse Practitioners) who all work together to provide you with the care you need, when you need it.  We recommend signing up for the patient portal called "MyChart".  Sign up information is provided on this After Visit Summary.  MyChart is used to connect with patients for Virtual Visits (Telemedicine).  Patients are able to view lab/test results, encounter notes, upcoming appointments, etc.  Non-urgent messages can be sent to your provider as well.   To learn more about what you can do with MyChart, go to [email or web address].    Your next appointment:   KEEP SCHEDULED FOLLOW-UP WITH DR. PATWARDHAN

## 2022-08-05 ENCOUNTER — Other Ambulatory Visit: Payer: Self-pay | Admitting: Cardiology

## 2022-08-05 ENCOUNTER — Other Ambulatory Visit (HOSPITAL_COMMUNITY): Payer: Self-pay | Admitting: Physician Assistant

## 2022-08-05 NOTE — Telephone Encounter (Signed)
Should she be on flecainide post ablation?  Thanks MJP

## 2022-09-01 ENCOUNTER — Ambulatory Visit: Payer: Medicare Other | Admitting: Cardiology

## 2022-09-01 ENCOUNTER — Encounter: Payer: Self-pay | Admitting: Cardiology

## 2022-09-01 VITALS — BP 136/80 | HR 67 | Resp 16 | Ht 61.0 in | Wt 132.0 lb

## 2022-09-01 DIAGNOSIS — I480 Paroxysmal atrial fibrillation: Secondary | ICD-10-CM

## 2022-09-01 NOTE — Progress Notes (Signed)
Follow up visit  Subjective:   Kristy Arias, female    DOB: 30-May-1950, 72 y.o.   MRN: [id number]   HPI  Chief Complaint  Patient presents with   Atrial Fibrillation   Follow-up    6 month    72 y/o Caucasian female with hypertension, hyperlipidemia, hypothyroidism, paroxysmal A-fib s/p Watchman implant (01/2022)  She is currently on Flecainide for breakthrough AF episodes.  Overall, she is doing well.   Current Outpatient Medications:    ALPRAZolam (XANAX) 0.5 MG tablet, TAKE 1 TABLET BY MOUTH 2 TIMES DAILY AS NEEDED FOR ANXIETY, Disp: 60 tablet, Rfl: 0   butalbital-acetaminophen-caffeine (FIORICET) 50-325-40 MG tablet, TAKE 1 TABLET BY MOUTH 2 TIMES DAILY AS NEEDED, Disp: 60 tablet, Rfl: 0   calcium elemental as carbonate (BARIATRIC TUMS ULTRA) 400 MG chewable tablet, Chew 1 tablet by mouth 2 (two) times daily., Disp: , Rfl:    cetirizine (ZYRTEC) 10 MG tablet, Take 10 mg by mouth daily., Disp: , Rfl:    Cholecalciferol (VITAMIN D3) 50 MCG (2000 UT) capsule, Take 2,000 Units by mouth in the morning., Disp: , Rfl:    Cyanocobalamin (B-12 SL), Take 1,000 mcg by mouth in the morning., Disp: , Rfl:    diclofenac (VOLTAREN) 75 MG EC tablet, Take 75 mg by mouth 2 (two) times daily., Disp: , Rfl:    diltiazem (CARDIZEM CD) 360 MG 24 hr capsule, Take 1 capsule (360 mg total) by mouth daily., Disp: 90 capsule, Rfl: 3   diltiazem (CARDIZEM) 30 MG tablet, Take 1 tablet (30 mg total) by mouth as needed (for Afib or palpitations)., Disp: 45 tablet, Rfl: 3   estradiol (VIVELLE-DOT) 0.1 MG/24HR patch, APPLY 1 PATCH TWICE A WEEK, Disp: 8 patch, Rfl: 12   ezetimibe (ZETIA) 10 MG tablet, TAKE 1 TABLET BY MOUTH EVERY DAY, Disp: 90 tablet, Rfl: 3   flecainide (TAMBOCOR) 50 MG tablet, TAKE 1 TABLET BY MOUTH 2 TIMES DAILY, Disp: 60 tablet, Rfl: 2   hydroxypropyl methylcellulose / hypromellose (ISOPTO TEARS / GONIOVISC) 2.5 % ophthalmic solution, Place 1 drop into both eyes daily as needed for dry  eyes., Disp: , Rfl:    levothyroxine (SYNTHROID) 50 MCG tablet, Take 1 tablet (50 mcg total) by mouth daily before breakfast., Disp: 90 tablet, Rfl: 0   losartan (COZAAR) 50 MG tablet, Take 1 tablet (50 mg total) by mouth daily., Disp: 90 tablet, Rfl: 3   Misc Natural Products (ELDERBERRY ZINC/VIT C/IMMUNE MT), Use as directed 1 tablet in the mouth or throat as needed., Disp: , Rfl:    OVER THE COUNTER MEDICATION, Take 1 capsule by mouth at bedtime. Delta 8, Disp: , Rfl:    rosuvastatin (CRESTOR) 5 MG tablet, TAKE 1 TABLET BY MOUTH 3 TIMES WEEKLY, Disp: 90 tablet, Rfl: 3   traZODone (DESYREL) 50 MG tablet, Take 1.5 tablets (75 mg total) by mouth at bedtime as needed for sleep., Disp: 135 tablet, Rfl: 3   venlafaxine XR (EFFEXOR-XR) 37.5 MG 24 hr capsule, Take 2 capsules (75 mg total) by mouth daily with breakfast., Disp: 180 capsule, Rfl: 1   Cardiovascular & other pertient studies:  Reviewed external labs and tests, independently interpreted  EKG 09/01/2022: Sinus rhythm 71 bpm Left axis deviation   Exercise Tetrofosmin stress test 08/17/2021: Exercise nuclear stress test was performed using Bruce protocol. Patient reached 7.8 METS, and 108% of age predicted maximum heart rate. Exercise capacity was good. No chest pain reported. Resting hypertension 150/80 mmHg with normal heart rate  and blood pressure response. Stress EKG revealed no ischemic changes. Normal myocardial perfusion. Stress LVEF 72%. Low risk study.  Echocardiogram 08/12/2021:  Normal LV systolic function with visual EF 60-65%. Left ventricle cavity  is normal in size. Normal left ventricular wall thickness. Normal global  wall motion. Normal diastolic filling pattern, normal LAP.  Mild tricuspid regurgitation. No evidence of pulmonary hypertension.  Mild pulmonic regurgitation.  Pericardium is normal. Insignificant pericardial effusion. There is no  hemodynamic significance.  Compared to 01/11/2019 no significant change.    Recent labs: 08/17/2021: Glucose 114, BUN/Cr 12/0.80. EGFR >60. Na/K 137/4.3. Rest of the CMP normal H/H 14/42. MCV 96. Platelets 292 HbA1C 5.4% Chol 180, TG 109, HDL 71, LDL 87 TSH 2.9 normal Trop HS, 4,5    Review of Systems  Cardiovascular:  Negative for chest pain, dyspnea on exertion, leg swelling, palpitations and syncope.  Musculoskeletal:  Positive for arthritis and joint pain.         There were no vitals filed for this visit.   There is no height or weight on file to calculate BMI. There were no vitals filed for this visit.    Objective:   Physical Exam Vitals and nursing note reviewed.  Constitutional:      General: She is not in acute distress. Neck:     Vascular: No JVD.  Cardiovascular:     Rate and Rhythm: Normal rate and regular rhythm.     Heart sounds: Normal heart sounds. No murmur heard. Pulmonary:     Effort: Pulmonary effort is normal.     Breath sounds: Normal breath sounds. No wheezing or rales.  Musculoskeletal:     Right lower leg: No edema.     Left lower leg: No edema.             Visit diagnoses:   ICD-10-CM   1. Paroxysmal atrial fibrillation (HCC)  I48.0 PCV CARDIAC STRESS TEST    EKG 12-Lead          Assessment & Recommendations:    72 y/o Caucasian female with hypertension, hyperlipidemia, hypothyroidism, paroxysmal A-fib s/p Watchman implant (01/2022)  PAF: Currently on Flecainide 50 mg bid, diltiazem 360 mg daily, losartan 50 mg daily CHA2DS2-VASc score 4, annual stroke risk 5% S/p Watchman placement (01/2022). Check exercise treadmill stress test given ongoing use of flecainide.  F/u in 1 year    Elder Negus, MD Pager: [phone] Office: [phone]

## 2022-09-06 ENCOUNTER — Other Ambulatory Visit: Payer: Self-pay | Admitting: Family Medicine

## 2022-09-06 DIAGNOSIS — Z760 Encounter for issue of repeat prescription: Secondary | ICD-10-CM

## 2022-09-06 DIAGNOSIS — F5101 Primary insomnia: Secondary | ICD-10-CM

## 2022-09-08 NOTE — Telephone Encounter (Signed)
Requesting: Xanax Contract: N/A UDS: N/A Last Visit: 03/31/2022 Next Visit: N/A Last Refill: 05/21/2022  Please Advise

## 2022-09-09 ENCOUNTER — Ambulatory Visit: Payer: Medicare Other

## 2022-09-09 DIAGNOSIS — I480 Paroxysmal atrial fibrillation: Secondary | ICD-10-CM

## 2022-09-23 ENCOUNTER — Ambulatory Visit
Admission: RE | Admit: 2022-09-23 | Discharge: 2022-09-23 | Disposition: A | Discharge status: Home / Self Care | Payer: Medicare Other | Source: Ambulatory Visit | Attending: Family Medicine | Admitting: Family Medicine

## 2022-09-23 ENCOUNTER — Encounter: Payer: Self-pay | Admitting: Family Medicine

## 2022-09-23 DIAGNOSIS — E2839 Other primary ovarian failure: Secondary | ICD-10-CM

## 2022-09-23 DIAGNOSIS — M8580 Other specified disorders of bone density and structure, unspecified site: Secondary | ICD-10-CM | POA: Insufficient documentation

## 2022-09-28 ENCOUNTER — Other Ambulatory Visit: Payer: Self-pay | Admitting: Family Medicine

## 2022-11-11 ENCOUNTER — Other Ambulatory Visit: Payer: Self-pay | Admitting: Family Medicine

## 2022-11-11 ENCOUNTER — Other Ambulatory Visit (HOSPITAL_COMMUNITY): Payer: Self-pay | Admitting: Physician Assistant

## 2022-11-11 DIAGNOSIS — Z760 Encounter for issue of repeat prescription: Secondary | ICD-10-CM

## 2022-11-13 ENCOUNTER — Other Ambulatory Visit: Payer: Self-pay | Admitting: Family Medicine

## 2022-11-13 DIAGNOSIS — F341 Dysthymic disorder: Secondary | ICD-10-CM

## 2022-12-27 ENCOUNTER — Other Ambulatory Visit: Payer: Self-pay | Admitting: Family Medicine

## 2023-01-06 ENCOUNTER — Other Ambulatory Visit: Payer: Self-pay | Admitting: Family Medicine

## 2023-01-06 DIAGNOSIS — E039 Hypothyroidism, unspecified: Secondary | ICD-10-CM

## 2023-01-11 ENCOUNTER — Telehealth: Payer: Self-pay

## 2023-01-11 NOTE — Telephone Encounter (Signed)
Called to check in with patient, who had LAAO on 02/04/2022. The patient reports doing well with no issues.  The patient understands to call with questions or concerns.  She was grateful for call.

## 2023-01-22 ENCOUNTER — Other Ambulatory Visit: Payer: Self-pay | Admitting: Student

## 2023-02-08 ENCOUNTER — Other Ambulatory Visit: Payer: Self-pay | Admitting: Family Medicine

## 2023-02-08 DIAGNOSIS — E782 Mixed hyperlipidemia: Secondary | ICD-10-CM

## 2023-02-13 ENCOUNTER — Encounter: Payer: Self-pay | Admitting: Cardiology

## 2023-02-15 ENCOUNTER — Telehealth: Payer: Self-pay | Admitting: *Deleted

## 2023-02-15 NOTE — Telephone Encounter (Signed)
Left message to call back to schedule tele pre op appt.  

## 2023-02-15 NOTE — Telephone Encounter (Signed)
   Name: Kristy Arias  DOB: [birth year]  MRN: [id number]  Primary Cardiologist: None   Preoperative team, please contact this patient and set up a phone call appointment for further preoperative risk assessment. Please obtain consent and complete medication review. Thank you for your help.  I confirm that guidance regarding antiplatelet and oral anticoagulation therapy has been completed and, if necessary, noted below (none requested).   I also confirmed the patient resides in the state of [place]. As per [facility] Medical Board telemedicine laws, the patient must reside in the state in which the provider is licensed.   Joylene Grapes, NP 02/15/2023, 4:04 PM [facility]

## 2023-02-15 NOTE — Telephone Encounter (Signed)
Given only occasional and short lasting episodes of A-fib, reasonable to monitor for now.  Normal exercise treadmill stress test in 08/2022.  Low cardiac risk for upcoming breast implant surgery. Please forward to preop, somehow I am not able to find them in pools here.   Thanks MJP

## 2023-02-15 NOTE — Telephone Encounter (Signed)
   Pre-operative Risk Assessment    Patient Name: Kristy Arias  DOB: 1950-07-12 MRN: [id number]   Date of last office visit: 09/01/22 DR. PATWARDHAN Date of next office visit: NONE   Request for Surgical Clearance    Procedure:   ELECTIVE BREAST SURGERY  Date of Surgery:  Clearance 02/22/23                                Surgeon:  DR. Etter Sjogren Surgeon's Group or Practice Name:  [facility] Phone number:  [phone] Fax number:  [phone]   Type of Clearance Requested:   - Medical ; NONE INDICATED TO HOLD   Type of Anesthesia:  General    Additional requests/questions:    Elpidio Anis   02/15/2023, 3:59 PM

## 2023-02-16 ENCOUNTER — Telehealth: Payer: Self-pay | Admitting: *Deleted

## 2023-02-16 NOTE — Telephone Encounter (Signed)
Pt has been added on ok per Robin Searing, NP. Med rec and consent are done.     Patient Consent for Virtual Visit        Kristy Arias has provided verbal consent on 02/16/2023 for a virtual visit (video or telephone).   CONSENT FOR VIRTUAL VISIT FOR:  Kristy Arias  By participating in this virtual visit I agree to the following:  I hereby voluntarily request, consent and authorize [facility] and its employed or contracted physicians, physician assistants, nurse practitioners or other licensed health care professionals (the Practitioner), to provide me with telemedicine health care services (the "Services") as deemed necessary by the treating Practitioner. I acknowledge and consent to receive the Services by the Practitioner via telemedicine. I understand that the telemedicine visit will involve communicating with the Practitioner through live audiovisual communication technology and the disclosure of certain medical information by electronic transmission. I acknowledge that I have been given the opportunity to request an in-person assessment or other available alternative prior to the telemedicine visit and am voluntarily participating in the telemedicine visit.  I understand that I have the right to withhold or withdraw my consent to the use of telemedicine in the course of my care at any time, without affecting my right to future care or treatment, and that the Practitioner or I may terminate the telemedicine visit at any time. I understand that I have the right to inspect all information obtained and/or recorded in the course of the telemedicine visit and may receive copies of available information for a reasonable fee.  I understand that some of the potential risks of receiving the Services via telemedicine include:  Delay or interruption in medical evaluation due to technological equipment failure or disruption; Information transmitted may not be sufficient (e.g. poor resolution  of images) to allow for appropriate medical decision making by the Practitioner; and/or  In rare instances, security protocols could fail, causing a breach of personal health information.  Furthermore, I acknowledge that it is my responsibility to provide information about my medical history, conditions and care that is complete and accurate to the best of my ability. I acknowledge that Practitioner's advice, recommendations, and/or decision may be based on factors not within their control, such as incomplete or inaccurate data provided by me or distortions of diagnostic images or specimens that may result from electronic transmissions. I understand that the practice of medicine is not an exact science and that Practitioner makes no warranties or guarantees regarding treatment outcomes. I acknowledge that a copy of this consent can be made available to me via my patient portal [facility] MyChart), or I can request a printed copy by calling the office of [facility].    I understand that my insurance will be billed for this visit.   I have read or had this consent read to me. I understand the contents of this consent, which adequately explains the benefits and risks of the Services being provided via telemedicine.  I have been provided ample opportunity to ask questions regarding this consent and the Services and have had my questions answered to my satisfaction. I give my informed consent for the services to be provided through the use of telemedicine in my medical care

## 2023-02-16 NOTE — Telephone Encounter (Signed)
Pt has been added on ok per Robin Searing, NP. Med rec and consent are done.

## 2023-02-17 ENCOUNTER — Ambulatory Visit: Payer: Medicare Other | Attending: Nurse Practitioner

## 2023-02-17 DIAGNOSIS — Z01810 Encounter for preprocedural cardiovascular examination: Secondary | ICD-10-CM

## 2023-02-17 NOTE — Progress Notes (Signed)
Virtual Visit via Telephone Note   Because of Kristy Arias's co-morbid illnesses, she is at least at moderate risk for complications without adequate follow up.  This format is felt to be most appropriate for this patient at this time.  The patient did not have access to video technology/had technical difficulties with video requiring transitioning to audio format only (telephone).  All issues noted in this document were discussed and addressed.  No physical exam could be performed with this format.  Please refer to the patient's chart for her consent to telehealth for [facility].  Evaluation Performed:  Preoperative cardiovascular risk assessment _____________   Date:  02/17/2023   Patient ID:  Kristy Arias, DOB December 21, 1950, MRN [id number] Patient Location:  Home Provider location:   Office  Primary Care Provider:  Pearline Cables, MD Primary Cardiologist:  None  Chief Complaint / Patient Profile   73 y.o. y/o female with a h/o paroxysmal AF s/p watchman implant 01/2022, HLD, HTN, hypothyroidism who is pending elective breast surgery and presents today for telephonic preoperative cardiovascular risk assessment.  History of Present Illness    Kristy Arias is a 73 y.o. female who presents via audio/video conferencing for a telehealth visit today.  Pt was last seen in cardiology clinic on 09/01/2022 by Dr. Rosemary Holms.  At that time ARLI PENINGTON was doing well and is not experiencing any new cardiac concerns.  Exercise stress test that was normal.  The patient is now pending procedure as outlined above. Since her last visit, she reports doing well but notes 2 episodes of atrial fibrillation when it occurred 3 weeks ago and the previous occurred on January 17 and lasted around 15 minutes with termination after taking as needed Cardizem 30 mg.  She notes the only medication change has been a switch from diclofenac to Celebrex but denies any other changes to her medications.  She  is fully functional from a cardiovascular standpoint and completely independent with her ADLs.    She denies chest pain, shortness of breath, lower extremity edema, fatigue, palpitations, melena, hematuria, hemoptysis, diaphoresis, weakness, presyncope, syncope, orthopnea, and PND.   Past Medical History    Past Medical History:  Diagnosis Date   Anemia    Anxiety    Arthritis    Depression    GERD (gastroesophageal reflux disease)    Glaucoma    Headache    Hyperlipidemia    Hypertension    Hypothyroidism    Presence of Watchman left atrial appendage closure device 02/04/2022   s/p LAAO with a 27 mm Watchman FLX by Dr Lalla Brothers   Syndrome Juliann Pares, cardiac    Past Surgical History:  Procedure Laterality Date   ABDOMINAL HYSTERECTOMY     ATRIAL FIBRILLATION ABLATION N/A 10/23/2021   Procedure: ATRIAL FIBRILLATION ABLATION;  Surgeon: Lanier Prude, MD;  Location: [facility] INVASIVE CV LAB;  Service: Cardiovascular;  Laterality: N/A;   BREAST ENHANCEMENT SURGERY     BREAST SURGERY     CARDIAC CATHETERIZATION  4/14   CHOLECYSTECTOMY     COSMETIC SURGERY     DILATION AND CURETTAGE OF UTERUS  2013   EYE SURGERY     LAPAROSCOPIC CHOLECYSTECTOMY SINGLE PORT N/A 06/29/2012   Procedure: LAPAROSCOPIC CHOLECYSTECTOMY SINGLE PORT IOC ;  Surgeon: Ardeth Sportsman, MD;  Location: [facility] ORS;  Service: General;  Laterality: N/A;   LEFT ATRIAL APPENDAGE OCCLUSION N/A 02/04/2022   Procedure: LEFT ATRIAL APPENDAGE OCCLUSION;  Surgeon: Lanier Prude, MD;  Location: [facility] INVASIVE CV LAB;  Service: Cardiovascular;  Laterality: N/A;   LEFT HEART CATHETERIZATION WITH CORONARY ANGIOGRAM N/A 05/23/2012   Procedure: LEFT HEART CATHETERIZATION WITH CORONARY ANGIOGRAM;  Surgeon: [name], MD;  Location: [facility] CATH LAB;  Service: Cardiovascular;  Laterality: N/A;   TEE WITHOUT CARDIOVERSION N/A 02/04/2022   Procedure: TRANSESOPHAGEAL ECHOCARDIOGRAM (TEE);  Surgeon: Lanier Prude, MD;  Location: [facility] INVASIVE CV  LAB;  Service: Cardiovascular;  Laterality: N/A;   TOTAL KNEE ARTHROPLASTY Right 08/25/2015   Procedure: RIGHT TOTAL KNEE ARTHROPLASTY;  Surgeon: Ollen Gross, MD;  Location: [facility] ORS;  Service: Orthopedics;  Laterality: Right;   TOTAL KNEE ARTHROPLASTY Left 08/09/2016   Procedure: LEFT TOTAL KNEE ARTHROPLASTY;  Surgeon: Ollen Gross, MD;  Location: [facility] ORS;  Service: Orthopedics;  Laterality: Left;    Allergies  No Known Allergies  Home Medications    Prior to Admission medications   Medication Sig Start Date End Date Taking? Authorizing Provider  ALPRAZolam (XANAX) 0.5 MG tablet TAKE 1 TABLET BY MOUTH 2 TIMES DAILY AS NEEDED FOR ANXIETY 09/08/22   Copland, Gwenlyn Found, MD  butalbital-acetaminophen-caffeine (FIORICET) 50-325-40 MG tablet TAKE 1 TABLET BY MOUTH 2 TIMES DAILY AS NEEDED 11/12/22   Copland, Gwenlyn Found, MD  calcium elemental as carbonate (BARIATRIC TUMS ULTRA) 400 MG chewable tablet Chew 1 tablet by mouth 2 (two) times daily.    [provider]  cetirizine (ZYRTEC) 10 MG tablet Take 10 mg by mouth daily.    [provider]  Cholecalciferol (VITAMIN D3) 50 MCG (2000 UT) capsule Take 2,000 Units by mouth in the morning.    [provider]  Cyanocobalamin (B-12 SL) Take 1,000 mcg by mouth in the morning.    [provider]  diclofenac (VOLTAREN) 75 MG EC tablet TAKE 1 TABLET BY MOUTH 2 TIMES DAILY AS NEEDED FOR PAIN 12/27/22   Copland, Gwenlyn Found, MD  diltiazem (CARDIZEM CD) 360 MG 24 hr capsule TAKE 1 CAPSULE BY MOUTH DAILY 01/24/23   Patwardhan, Manish J, MD  diltiazem (CARDIZEM) 30 MG tablet Take 1 tablet (30 mg total) by mouth as needed (for Afib or palpitations). 04/29/22   Graciella Freer, PA-C  estradiol (VIVELLE-DOT) 0.1 MG/24HR patch APPLY 1 PATCH TWICE A WEEK 04/06/22   Copland, Gwenlyn Found, MD  ezetimibe (ZETIA) 10 MG tablet Take 1 tablet (10 mg total) by mouth daily. 02/08/23   Copland, Gwenlyn Found, MD  flecainide (TAMBOCOR) 50 MG tablet  TAKE 1 TABLET BY MOUTH 2 TIMES DAILY 11/12/22   Patwardhan, Anabel Bene, MD  hydroxypropyl methylcellulose / hypromellose (ISOPTO TEARS / GONIOVISC) 2.5 % ophthalmic solution Place 1 drop into both eyes daily as needed for dry eyes.    [provider]  levothyroxine (SYNTHROID) 50 MCG tablet TAKE 1 TABLET BY MOUTH DAILY BEFORE breakfast 01/07/23   Copland, Gwenlyn Found, MD  losartan (COZAAR) 50 MG tablet Take 1 tablet (50 mg total) by mouth daily. 03/31/22   Copland, Gwenlyn Found, MD  Misc Natural Products (ELDERBERRY ZINC/VIT C/IMMUNE MT) Use as directed 1 tablet in the mouth or throat as needed.    [provider]  OVER THE COUNTER MEDICATION Take 1 capsule by mouth at bedtime. Delta 8    [provider]  rosuvastatin (CRESTOR) 5 MG tablet TAKE 1 TABLET BY MOUTH 3 TIMES WEEKLY 03/31/22   Copland, Gwenlyn Found, MD  venlafaxine XR (EFFEXOR-XR) 37.5 MG 24 hr capsule Take 2 capsules (75 mg total) by mouth daily with breakfast. 11/15/22  Copland, Gwenlyn Found, MD    Physical Exam    Vital Signs:  JEVON SAFER does not have vital signs available for review today.  Given telephonic nature of communication, physical exam is limited. AAOx3. NAD. Normal affect.  Speech and respirations are unlabored.  Accessory Clinical Findings    None  Assessment & Plan    1.  Preoperative Cardiovascular Risk Assessment: -Patient's RCRI score is 0.9% The patient affirms she has been doing well without any new cardiac symptoms. They are able to achieve 7 METS without cardiac limitations. Therefore, based on ACC/AHA guidelines, the patient would be at acceptable risk for the planned procedure without further cardiovascular testing. The patient was advised that if she develops new symptoms prior to surgery to contact our office to arrange for a follow-up visit, and she verbalized understanding.   The patient was advised that if she develops new symptoms prior to surgery to contact our office to arrange  for a follow-up visit, and she verbalized understanding.   A copy of this note will be routed to requesting surgeon.  Time:   Today, I have spent 8 minutes with the patient with telehealth technology discussing medical history, symptoms, and management plan.     Napoleon Form, Leodis Rains, NP   02/17/2023, 7:36 AM

## 2023-02-22 ENCOUNTER — Other Ambulatory Visit: Payer: Self-pay | Admitting: Plastic Surgery

## 2023-02-23 LAB — SURGICAL PATHOLOGY

## 2023-03-21 ENCOUNTER — Other Ambulatory Visit: Payer: Self-pay | Admitting: Family Medicine

## 2023-03-27 NOTE — Progress Notes (Addendum)
 [facility] [address], [address] [place], [place] [address] [phone]  Date:  04/04/2023   Name:  Kristy Arias   DOB:  January 10, 1951   MRN:  [id number]  PCP:  Pearline Cables, MD    Chief Complaint: Annual Exam (Concerns/ questions: pt has had breast surgery in Jan to remove/replace implants/AWV: due/Cologuard due)   History of Present Illness:  Kristy Arias is a 73 y.o. very pleasant female patient who presents with the following:  Patient seen today for physical exam Married, she is retired from her work as a Teacher, early years/pre Most recent visit with myself was about 1 year ago also for physical History of hypertension, migraine, hyperlipidemia, hypothyroidism , open angle glaucoma followed by optho, atrial fibrillation  She has a Watchman and also underwent LAAO closure per Dr. Lalla Brothers We selected her last year she wondered about possibly rheumatoid arthritis.  We did some lab work which did not suggest RA.  X-ray suggested osteo versus erosive osteoarthritis.  We discussed having her see a rheumatologist but she held off at that time  She was seen by cardiology in August-[facility]:  she does in once a year 73 y/o Caucasian female with hypertension, hyperlipidemia, hypothyroidism, paroxysmal A-fib s/p Watchman implant (01/2022) PAF: Currently on Flecainide 50 mg bid, diltiazem 360 mg daily, losartan 50 mg daily CHA2DS2-VASc score 4, annual stroke risk 5% S/p Watchman placement (01/2022). Check exercise treadmill stress test given ongoing use of flecainide.   She had a colonoscopy at [facility] 05/2021- it was clear, she does not plan to be screened again Mammogram- this was done in April 2024,normal  DEXA scan 8/24 RSV, Shingrix, flu, COVID all up to date She had her breast implants changed out in January- this went fine and she is pleased with her results  She is now cleared to get back to her exercise  program  Alprazolam as needed Fioricet as needed Calcium and vitamin D Vivelle-Dot Zetia 10 Flecainide 50 twice daily Levothyroxine 50 Losartan 50 Crestor 5  Never a smoker, no alcohol   Her main concern is difficulty sleeping.  She takes venlafaxine for anxiety, between 37.5 and 75 mg daily.  She also will use alprazolam at times.  Recently she tried using some hydroxyzine 25 that she had on hand and it was helpful.  She wonders if I can refill this for her today  Wt Readings from Last 3 Encounters:  04/04/23 126 lb 3.2 oz (57.2 kg)  09/01/22 132 lb (59.9 kg)  08/02/22 123 lb (55.8 kg)    Patient Active Problem List   Diagnosis Date Noted   Osteopenia 09/23/2022   Atrial fibrillation, unspecified type (HCC) 02/04/2022   Presence of Watchman left atrial appendage closure device 02/04/2022   Hypercoagulable state due to paroxysmal atrial fibrillation (HCC) 11/20/2021   PAF (paroxysmal atrial fibrillation) (HCC)    Hypothyroidism (acquired) 02/26/2019   OA (osteoarthritis) of knee 08/25/2015   Memory loss 06/05/2014   Chronic migraine without aura without status migrainosus, not intractable 06/05/2014   Benign hypertension 11/02/2011   Fibromuscular dysplasia (HCC) 11/02/2011   Hyperlipidemia 11/02/2011   Depression 11/02/2011    Past Medical History:  Diagnosis Date   Anemia    Anxiety    Arthritis    Depression    GERD (gastroesophageal reflux disease)    Glaucoma    Headache    Hyperlipidemia    Hypertension    Hypothyroidism  Presence of Watchman left atrial appendage closure device 02/04/2022   s/p LAAO with a 27 mm Watchman FLX by Dr Lalla Brothers   Syndrome X, cardiac [facility])     Past Surgical History:  Procedure Laterality Date   ABDOMINAL HYSTERECTOMY     ATRIAL FIBRILLATION ABLATION N/A 10/23/2021   Procedure: ATRIAL FIBRILLATION ABLATION;  Surgeon: Lanier Prude, MD;  [facility] INVASIVE CV LAB;  Service: Cardiovascular;  Laterality: N/A;   BREAST  ENHANCEMENT SURGERY     BREAST SURGERY     CARDIAC CATHETERIZATION  4/14   CHOLECYSTECTOMY     COSMETIC SURGERY     DILATION AND CURETTAGE OF UTERUS  2013   EYE SURGERY     LAPAROSCOPIC CHOLECYSTECTOMY SINGLE PORT N/A 06/29/2012   Procedure: LAPAROSCOPIC CHOLECYSTECTOMY SINGLE PORT IOC ;  Surgeon: Ardeth Sportsman, MD;  Location: [facility] OR[facility];  Service: General;  Laterality: N/A;   LEFT ATRIAL APPENDAGE OCCLUSION N/A 02/04/2022   Procedure: LEFT ATRIAL APPENDAGE OCCLUSION;  Surgeon: Lanier Prude, MD;  [facility];  Service: Cardiovascular;  Laterality: N/A;   LEFT HEART CATHETERIZATION WITH CORONARY ANGIOGRAM N/A 05/23/2012   Procedure: LEFT HEART CATHETERIZATION WITH CORONARY ANGIOGRAM;  Surgeon: [name], MD;  Location: [facility] CATH LAB;  Service: Cardiovascular;  Laterality: N/A;   TEE WITHOUT CARDIOVERSION N/A 02/04/2022   Procedure: TRANSESOPHAGEAL ECHOCARDIOGRAM (TEE);  Surgeon: Lanier Prude, MD;  Location: [facility] INVASIVE CV LAB;  Service: Cardiovascular;  Laterality: N/A;   TOTAL KNEE ARTHROPLASTY Right 08/25/2015   Procedure: RIGHT TOTAL KNEE ARTHROPLASTY;  Surgeon: Ollen Gross, MD;  Location: [facility];  Service: Orthopedics;  Laterality: Right;   TOTAL KNEE ARTHROPLASTY Left 08/09/2016   Procedure: LEFT TOTAL KNEE ARTHROPLASTY;  Surgeon: Ollen Gross, MD;  Location: [facility];  Service: Orthopedics;  Laterality: Left;    Social History   Tobacco Use   Smoking status: Never   Smokeless tobacco: Never   Tobacco comments:    Never smoke 11/20/21  Vaping Use   Vaping status: Never Used  Substance Use Topics   Alcohol use: No   Drug use: No    Family History  Problem Relation Age of Onset   Cancer Mother    Hyperlipidemia Mother    Hypertension Mother    Hypertension Father    Diabetes Father    Heart disease Father    Hyperlipidemia Father    Cancer Sister    Hypertension Maternal Grandmother    Heart disease Maternal Grandmother    Diabetes Maternal  Grandfather    Hyperlipidemia Maternal Grandfather    Heart disease Maternal Grandfather    Osteoporosis Paternal Grandmother    Heart disease Paternal Grandmother    Heart disease Paternal Grandfather     No Known Allergies  Medication list has been reviewed and updated.  Current Outpatient Medications on File Prior to Visit  Medication Sig Dispense Refill   ALPRAZolam (XANAX) 0.5 MG tablet TAKE 1 TABLET BY MOUTH 2 TIMES DAILY AS NEEDED FOR ANXIETY 60 tablet 1   butalbital-acetaminophen-caffeine (FIORICET) 50-325-40 MG tablet TAKE 1 TABLET BY MOUTH 2 TIMES DAILY AS NEEDED 60 tablet 1   calcium elemental as carbonate (BARIATRIC TUMS ULTRA) 400 MG chewable tablet Chew 1 tablet by mouth 2 (two) times daily.     cetirizine (ZYRTEC) 10 MG tablet Take 10 mg by mouth daily.     Cholecalciferol (VITAMIN D3) 50 MCG (2000 UT) capsule Take 2,000 Units by mouth in the morning.  Cyanocobalamin (B-12 SL) Take 1,000 mcg by mouth in the morning.     diclofenac (VOLTAREN) 75 MG EC tablet TAKE 1 TABLET BY MOUTH 2 TIMES DAILY AS NEEDED FOR PAIN 180 tablet 0   diltiazem (CARDIZEM CD) 360 MG 24 hr capsule TAKE 1 CAPSULE BY MOUTH DAILY 90 capsule 1   diltiazem (CARDIZEM) 30 MG tablet Take 1 tablet (30 mg total) by mouth as needed (for Afib or palpitations). 45 tablet 3   estradiol (VIVELLE-DOT) 0.1 MG/24HR patch APPLY 1 PATCH TWICE A WEEK 8 patch 12   ezetimibe (ZETIA) 10 MG tablet Take 1 tablet (10 mg total) by mouth daily. 90 tablet 0   flecainide (TAMBOCOR) 50 MG tablet TAKE 1 TABLET BY MOUTH 2 TIMES DAILY 60 tablet 11   hydroxypropyl methylcellulose / hypromellose (ISOPTO TEARS / GONIOVISC) 2.5 % ophthalmic solution Place 1 drop into both eyes daily as needed for dry eyes.     levothyroxine (SYNTHROID) 50 MCG tablet TAKE 1 TABLET BY MOUTH DAILY BEFORE breakfast 90 tablet 0   losartan (COZAAR) 50 MG tablet TAKE 1 TABLET BY MOUTH EVERY DAY 90 tablet 3   Misc Natural Products (ELDERBERRY ZINC/VIT  C/IMMUNE MT) Use as directed 1 tablet in the mouth or throat as needed.     OVER THE COUNTER MEDICATION Take 1 capsule by mouth at bedtime. Delta 8     rosuvastatin (CRESTOR) 5 MG tablet TAKE 1 TABLET BY MOUTH 3 TIMES WEEKLY 90 tablet 3   venlafaxine XR (EFFEXOR-XR) 37.5 MG 24 hr capsule Take 2 capsules (75 mg total) by mouth daily with breakfast. 180 capsule 0   No current facility-administered medications on file prior to visit.    Review of Systems:  As per HPI- otherwise negative.   Physical Examination: Vitals:   04/04/23 1118  BP: 124/72  Pulse: 63  Resp: 18  Temp: 98.2 F (36.8 C)  SpO2: 97%   Vitals:   04/04/23 1118  Weight: 126 lb 3.2 oz (57.2 kg)  Height: 5\' 2"  (1.575 m)   Body mass index is 23.08 kg/m. Ideal Body Weight: Weight in (lb) to have BMI = 25: 136.4  GEN: no acute distress. Normal weight, looks well  HEENT: Atraumatic, Normocephalic.  Bilateral TM wnl, oropharynx normal.  PEERL,EOMI.   Ears and Nose: No external deformity. CV: RRR, No M/G/R. No JVD. No thrill. No extra heart sounds. PULM: CTA B, no wheezes, crackles, rhonchi. No retractions. No resp. distress. No accessory muscle use. ABD: S, NT, ND, +BS. No rebound. No HSM. EXTR: No c/c/e PSYCH: Normally interactive. Conversant.    Assessment and Plan: Physical exam  Mixed hyperlipidemia - Plan: Lipid panel  Screening for diabetes mellitus - Plan: Comprehensive metabolic panel, Hemoglobin A1c  Essential hypertension - Plan: CBC, Comprehensive metabolic panel  Hypothyroidism (acquired) - Plan: TSH  Atrial fibrillation, unspecified type (HCC)  Persistent depressive disorder - Plan: venlafaxine XR (EFFEXOR-XR) 37.5 MG 24 hr capsule  Acquired hypothyroidism - Plan: levothyroxine (SYNTHROID) 50 MCG tablet  Primary insomnia - Plan: hydrOXYzine (VISTARIL) 25 MG capsule, ALPRAZolam (XANAX) 0.5 MG tablet  Osteoarthritis of left hip, unspecified osteoarthritis type - Plan: diclofenac (VOLTAREN)  75 MG EC tablet  Physical exam today.  Encouraged healthy diet and exercise routine Refilled medications as above.  Will have her use hydralazine as needed at bedtime for sleep, she also sometimes uses alprazolam Will plan further follow- up pending labs.  Signed Abbe Amsterdam, MD  Received labs as below, message to pt  Results  for orders placed or performed in visit on 04/04/23  CBC   Collection Time: 04/04/23 11:48 AM  Result Value Ref Range   WBC 6.2 4.0 - 10.5 K/uL   RBC 4.40 3.87 - 5.11 Mil/uL   Platelets 281.0 150.0 - 400.0 K/uL   Hemoglobin 14.6 12.0 - 15.0 g/dL   HCT 78.2 95.6 - 21.3 %   MCV 99.9 78.0 - 100.0 fl   MCHC 33.1 30.0 - 36.0 g/dL   RDW 08.6 57.8 - 46.9 %  Comprehensive metabolic panel   Collection Time: 04/04/23 11:48 AM  Result Value Ref Range   Sodium 139 135 - 145 mEq/L   Potassium 4.1 3.5 - 5.1 mEq/L   Chloride 105 96 - 112 mEq/L   CO2 27 19 - 32 mEq/L   Glucose, Bld 92 70 - 99 mg/dL   BUN 18 6 - 23 mg/dL   Creatinine, Ser 6.29 0.40 - 1.20 mg/dL   Total Bilirubin 0.4 0.2 - 1.2 mg/dL   Alkaline Phosphatase 75 39 - 117 U/L   AST 13 0 - 37 U/L   ALT 13 0 - 35 U/L   Total Protein 6.5 6.0 - 8.3 g/dL   Albumin 4.1 3.5 - 5.2 g/dL   GFR 52.84 >13.24 mL/min   Calcium 9.6 8.4 - 10.5 mg/dL  Hemoglobin M0N   Collection Time: 04/04/23 11:48 AM  Result Value Ref Range   Hgb A1c MFr Bld 5.6 4.6 - 6.5 %  Lipid panel   Collection Time: 04/04/23 11:48 AM  Result Value Ref Range   Cholesterol 207 (H) 0 - 200 mg/dL   Triglycerides 027.2 (H) 0.0 - 149.0 mg/dL   HDL 53.66 >44.03 mg/dL   VLDL 47.4 0.0 - 25.9 mg/dL   LDL Cholesterol 563 (H) 0 - 99 mg/dL   Total CHOL/HDL Ratio 3    NonHDL 132.40   TSH   Collection Time: 04/04/23 11:48 AM  Result Value Ref Range   TSH 3.32 0.35 - 5.50 uIU/mL

## 2023-04-01 ENCOUNTER — Other Ambulatory Visit: Payer: Self-pay | Admitting: Family Medicine

## 2023-04-01 DIAGNOSIS — E782 Mixed hyperlipidemia: Secondary | ICD-10-CM

## 2023-04-01 DIAGNOSIS — I10 Essential (primary) hypertension: Secondary | ICD-10-CM

## 2023-04-04 ENCOUNTER — Encounter: Payer: Self-pay | Admitting: Family Medicine

## 2023-04-04 ENCOUNTER — Ambulatory Visit (INDEPENDENT_AMBULATORY_CARE_PROVIDER_SITE_OTHER): Payer: Medicare Other | Admitting: Family Medicine

## 2023-04-04 ENCOUNTER — Telehealth: Payer: Self-pay | Admitting: *Deleted

## 2023-04-04 VITALS — BP 124/72 | HR 63 | Temp 98.2°F | Resp 18 | Ht 62.0 in | Wt 126.2 lb

## 2023-04-04 DIAGNOSIS — Z131 Encounter for screening for diabetes mellitus: Secondary | ICD-10-CM

## 2023-04-04 DIAGNOSIS — I10 Essential (primary) hypertension: Secondary | ICD-10-CM

## 2023-04-04 DIAGNOSIS — E782 Mixed hyperlipidemia: Secondary | ICD-10-CM

## 2023-04-04 DIAGNOSIS — E039 Hypothyroidism, unspecified: Secondary | ICD-10-CM

## 2023-04-04 DIAGNOSIS — Z0000 Encounter for general adult medical examination without abnormal findings: Secondary | ICD-10-CM | POA: Diagnosis not present

## 2023-04-04 DIAGNOSIS — F5101 Primary insomnia: Secondary | ICD-10-CM

## 2023-04-04 DIAGNOSIS — F341 Dysthymic disorder: Secondary | ICD-10-CM

## 2023-04-04 DIAGNOSIS — M1612 Unilateral primary osteoarthritis, left hip: Secondary | ICD-10-CM

## 2023-04-04 DIAGNOSIS — I4891 Unspecified atrial fibrillation: Secondary | ICD-10-CM

## 2023-04-04 LAB — LIPID PANEL
Cholesterol: 207 mg/dL — ABNORMAL HIGH (ref 0–200)
HDL: 74.5 mg/dL (ref >39.00)
LDL Cholesterol: 101 mg/dL — ABNORMAL HIGH (ref 0–99)
NonHDL: 132.4
Total CHOL/HDL Ratio: 3
Triglycerides: 159 mg/dL — ABNORMAL HIGH (ref 0.0–149.0)
VLDL: 31.8 mg/dL (ref 0.0–40.0)

## 2023-04-04 LAB — COMPREHENSIVE METABOLIC PANEL
ALT: 13 U/L (ref 0–35)
AST: 13 U/L (ref 0–37)
Albumin: 4.1 g/dL (ref 3.5–5.2)
Alkaline Phosphatase: 75 U/L (ref 39–117)
BUN: 18 mg/dL (ref 6–23)
CO2: 27 meq/L (ref 19–32)
Calcium: 9.6 mg/dL (ref 8.4–10.5)
Chloride: 105 meq/L (ref 96–112)
Creatinine, Ser: 0.77 mg/dL (ref 0.40–1.20)
GFR: 78.06 mL/min (ref >60.00)
Glucose, Bld: 92 mg/dL (ref 70–99)
Potassium: 4.1 meq/L (ref 3.5–5.1)
Sodium: 139 meq/L (ref 135–145)
Total Bilirubin: 0.4 mg/dL (ref 0.2–1.2)
Total Protein: 6.5 g/dL (ref 6.0–8.3)

## 2023-04-04 LAB — CBC
HCT: 43.9 % (ref 36.0–46.0)
Hemoglobin: 14.6 g/dL (ref 12.0–15.0)
MCHC: 33.1 g/dL (ref 30.0–36.0)
MCV: 99.9 fl (ref 78.0–100.0)
Platelets: 281 10*3/uL (ref 150.0–400.0)
RBC: 4.4 Mil/uL (ref 3.87–5.11)
RDW: 13.2 % (ref 11.5–15.5)
WBC: 6.2 10*3/uL (ref 4.0–10.5)

## 2023-04-04 LAB — HEMOGLOBIN A1C: Hgb A1c MFr Bld: 5.6 % (ref 4.6–6.5)

## 2023-04-04 LAB — TSH: TSH: 3.32 u[IU]/mL (ref 0.35–5.50)

## 2023-04-04 MED ORDER — HYDROXYZINE PAMOATE 25 MG PO CAPS: 25.0000 mg | ORAL_CAPSULE | Freq: Every evening | ORAL | 2 refills | Status: AC | PRN

## 2023-04-04 MED ORDER — ALPRAZOLAM 0.5 MG PO TABS: 0.5000 mg | ORAL_TABLET | Freq: Two times a day (BID) | ORAL | 1 refills | Status: DC | PRN

## 2023-04-04 MED ORDER — LEVOTHYROXINE SODIUM 50 MCG PO TABS: 50.0000 ug | ORAL_TABLET | Freq: Every day | ORAL | 3 refills | Status: DC

## 2023-04-04 MED ORDER — HYDROXYZINE PAMOATE 25 MG PO CAPS: 25.0000 mg | ORAL_CAPSULE | Freq: Three times a day (TID) | ORAL | 2 refills | Status: DC | PRN

## 2023-04-04 MED ORDER — VENLAFAXINE HCL ER 37.5 MG PO CP24: 75.0000 mg | ORAL_CAPSULE | Freq: Every day | ORAL | 2 refills | Status: DC

## 2023-04-04 MED ORDER — DICLOFENAC SODIUM 75 MG PO TBEC: 75.0000 mg | DELAYED_RELEASE_TABLET | Freq: Two times a day (BID) | ORAL | 1 refills | Status: DC

## 2023-04-04 NOTE — Telephone Encounter (Signed)
 Copied from CRM [id number]. Topic: Clinical - Prescription Issue >> Apr 04, 2023 11:51 AM Myrtice Lauth wrote: Reason for CRM: Pharmacy called regarding levothyroxine (SYNTHROID) 50 MCG tablet . States the medication has been recalled. Pharmacy would like to change the medication to aneal manufacture, which the medication with change the levels in pt's blood. Please call [phone]

## 2023-04-04 NOTE — Patient Instructions (Signed)
 Great to see you again today- I will be in touch with your labs asap Let me know if you need anything- otherwise let's plan to visit in 6-12 months

## 2023-04-05 NOTE — Telephone Encounter (Signed)
 Called and gave the verbal to change this.

## 2023-04-05 NOTE — Telephone Encounter (Signed)
Okay for change? °

## 2023-04-25 ENCOUNTER — Other Ambulatory Visit: Payer: Self-pay | Admitting: Cardiology

## 2023-05-13 ENCOUNTER — Other Ambulatory Visit: Payer: Self-pay | Admitting: Family Medicine

## 2023-05-13 DIAGNOSIS — E782 Mixed hyperlipidemia: Secondary | ICD-10-CM

## 2023-05-26 ENCOUNTER — Other Ambulatory Visit: Payer: Self-pay | Admitting: Family Medicine

## 2023-05-26 DIAGNOSIS — M1612 Unilateral primary osteoarthritis, left hip: Secondary | ICD-10-CM

## 2023-06-09 ENCOUNTER — Other Ambulatory Visit: Payer: Self-pay | Admitting: Family Medicine

## 2023-06-09 DIAGNOSIS — F5101 Primary insomnia: Secondary | ICD-10-CM

## 2023-06-13 ENCOUNTER — Other Ambulatory Visit: Payer: Self-pay | Admitting: Family Medicine

## 2023-06-13 DIAGNOSIS — Z760 Encounter for issue of repeat prescription: Secondary | ICD-10-CM

## 2023-07-23 NOTE — Progress Notes (Unsigned)
 [facility] [address] [place], [place] [address] [phone]  Date:  07/25/2023   Name:  Kristy Arias   DOB:  1950-12-01   MRN:  [id number]  PCP:  Watt Harlene JAYSON, MD    Chief Complaint: No chief complaint on file.   History of Present Illness:  Kristy Arias is a 73 y.o. very pleasant female patient who presents with the following:  Pt seen today with concern of headache Last seen by myself in March   History of hypertension, migraine, hyperlipidemia, hypothyroidism , open angle glaucoma followed by optho, atrial fibrillation  She has a Watchman and also underwent LAAO closure per Dr. Cindie  Patient Active Problem List   Diagnosis Date Noted   Osteopenia 09/23/2022   Atrial fibrillation, unspecified type (HCC) 02/04/2022   Presence of Watchman left atrial appendage closure device 02/04/2022   Hypercoagulable state due to paroxysmal atrial fibrillation (HCC) 11/20/2021   PAF (paroxysmal atrial fibrillation) (HCC)    Hypothyroidism (acquired) 02/26/2019   OA (osteoarthritis) of knee 08/25/2015   Memory loss 06/05/2014   Chronic migraine without aura without status migrainosus, not intractable 06/05/2014   Benign hypertension 11/02/2011   Fibromuscular dysplasia (HCC) 11/02/2011   Hyperlipidemia 11/02/2011   Depression 11/02/2011    Past Medical History:  Diagnosis Date   Anemia    Anxiety    Arthritis    Depression    GERD (gastroesophageal reflux disease)    Glaucoma    Headache    Hyperlipidemia    Hypertension    Hypothyroidism    Presence of Watchman left atrial appendage closure device 02/04/2022   s/p LAAO with a 27 mm Watchman FLX by Dr Cindie   Syndrome X, cardiac [facility])     Past Surgical History:  Procedure Laterality Date   ABDOMINAL HYSTERECTOMY     ATRIAL FIBRILLATION ABLATION N/A 10/23/2021   Procedure: ATRIAL FIBRILLATION ABLATION;  Surgeon: Cindie Ole DASEN, MD;   [facility]: [facility] INVASIVE CV LAB;  Service: Cardiovascular;  Laterality: N/A;   BREAST ENHANCEMENT SURGERY     BREAST SURGERY     CARDIAC CATHETERIZATION  4/14   CHOLECYSTECTOMY     COSMETIC SURGERY     DILATION AND CURETTAGE OF UTERUS  2013   EYE SURGERY     LAPAROSCOPIC CHOLECYSTECTOMY SINGLE PORT N/A 06/29/2012   Procedure: LAPAROSCOPIC CHOLECYSTECTOMY SINGLE PORT IOC ;  Surgeon: Elspeth KYM Schultze, MD;  Location: [facility];  Service: General;  Laterality: N/A;   LEFT ATRIAL APPENDAGE OCCLUSION N/A 02/04/2022   Procedure: LEFT ATRIAL APPENDAGE OCCLUSION;  Surgeon: Cindie Ole DASEN, MD;  [facility];  Service: Cardiovascular;  Laterality: N/A;   LEFT HEART CATHETERIZATION WITH CORONARY ANGIOGRAM N/A 05/23/2012   Procedure: LEFT HEART CATHETERIZATION WITH CORONARY ANGIOGRAM;  Surgeon: Erick JONELLE Bergamo, MD;  Location: [facility] CATH LAB;  Service: Cardiovascular;  Laterality: N/A;   TEE WITHOUT CARDIOVERSION N/A 02/04/2022   Procedure: TRANSESOPHAGEAL ECHOCARDIOGRAM (TEE);  Surgeon: Cindie Ole DASEN, MD;  Location: [facility] INVASIVE CV LAB;  Service: Cardiovascular;  Laterality: N/A;   TOTAL KNEE ARTHROPLASTY Right 08/25/2015   Procedure: RIGHT TOTAL KNEE ARTHROPLASTY;  Surgeon: Dempsey Moan, MD;  Location: [facility];  Service: Orthopedics;  Laterality: Right;   TOTAL KNEE ARTHROPLASTY Left 08/09/2016   Procedure: LEFT TOTAL KNEE ARTHROPLASTY;  Surgeon: Moan Dempsey, MD;  Location: [facility];  Service: Orthopedics;  Laterality: Left;    Social History  Tobacco Use   Smoking status: Never   Smokeless tobacco: Never   Tobacco comments:    Never smoke 11/20/21  Vaping Use   Vaping status: Never Used  Substance Use Topics   Alcohol use: No   Drug use: No    Family History  Problem Relation Age of Onset   Cancer Mother    Hyperlipidemia Mother    Hypertension Mother    Hypertension Father    Diabetes Father    Heart disease Father    Hyperlipidemia Father    Cancer Sister     Hypertension Maternal Grandmother    Heart disease Maternal Grandmother    Diabetes Maternal Grandfather    Hyperlipidemia Maternal Grandfather    Heart disease Maternal Grandfather    Osteoporosis Paternal Grandmother    Heart disease Paternal Grandmother    Heart disease Paternal Grandfather     No Known Allergies  Medication list has been reviewed and updated.  Current Outpatient Medications on File Prior to Visit  Medication Sig Dispense Refill   ALPRAZolam  (XANAX ) 0.5 MG tablet TAKE 1 TABLET BY MOUTH 2 TIMES DAILY AS NEEDED FOR ANXIETY 60 tablet 1   butalbital -acetaminophen -caffeine  (FIORICET) 50-325-40 MG tablet TAKE 1 TABLET BY MOUTH 2 TIMES DAILY AS NEEDED 60 tablet 1   calcium  elemental as carbonate (BARIATRIC TUMS ULTRA) 400 MG chewable tablet Chew 1 tablet by mouth 2 (two) times daily.     cetirizine (ZYRTEC) 10 MG tablet Take 10 mg by mouth daily.     Cholecalciferol (VITAMIN D3) 50 MCG (2000 UT) capsule Take 2,000 Units by mouth in the morning.     Cyanocobalamin (B-12 SL) Take 1,000 mcg by mouth in the morning.     diclofenac  (VOLTAREN ) 75 MG EC tablet TAKE 1 TABLET BY MOUTH 2 TIMES DAILY 180 tablet 1   diltiazem  (CARDIZEM  CD) 360 MG 24 hr capsule TAKE 1 CAPSULE BY MOUTH DAILY 90 capsule 1   diltiazem  (CARDIZEM ) 30 MG tablet Take 1 tablet (30 mg total) by mouth as needed (for Afib or palpitations). 45 tablet 3   estradiol (VIVELLE-DOT) 0.1 MG/24HR patch APPLY 1 PATCH TWICE A WEEK 8 patch 12   ezetimibe  (ZETIA ) 10 MG tablet TAKE 1 TABLET BY MOUTH DAILY 90 tablet 1   flecainide  (TAMBOCOR ) 50 MG tablet TAKE 1 TABLET BY MOUTH 2 TIMES DAILY 60 tablet 11   hydroxypropyl methylcellulose / hypromellose (ISOPTO TEARS / GONIOVISC) 2.5 % ophthalmic solution Place 1 drop into both eyes daily as needed for dry eyes.     hydrOXYzine  (VISTARIL ) 25 MG capsule Take 1 capsule (25 mg total) by mouth at bedtime as needed. 90 capsule 2   levothyroxine  (SYNTHROID ) 50 MCG tablet Take 1 tablet  (50 mcg total) by mouth daily before breakfast. 90 tablet 3   losartan  (COZAAR ) 50 MG tablet TAKE 1 TABLET BY MOUTH EVERY DAY 90 tablet 3   Misc Natural Products (ELDERBERRY ZINC/VIT C/IMMUNE MT) Use as directed 1 tablet in the mouth or throat as needed.     OVER THE COUNTER MEDICATION Take 1 capsule by mouth at bedtime. Delta 8     rosuvastatin  (CRESTOR ) 5 MG tablet TAKE 1 TABLET BY MOUTH 3 TIMES WEEKLY 90 tablet 3   venlafaxine  XR (EFFEXOR -XR) 37.5 MG 24 hr capsule Take 2 capsules (75 mg total) by mouth daily with breakfast. 180 capsule 2   No current facility-administered medications on file prior to visit.    Review of Systems:  As per HPI- otherwise negative.  Physical Examination: There were no vitals filed for this visit. There were no vitals filed for this visit. There is no height or weight on file to calculate BMI. Ideal Body Weight:    GEN: no acute distress. HEENT: Atraumatic, Normocephalic.  Ears and Nose: No external deformity. CV: RRR, No M/G/R. No JVD. No thrill. No extra heart sounds. PULM: CTA B, no wheezes, crackles, rhonchi. No retractions. No resp. distress. No accessory muscle use. ABD: S, NT, ND, +BS. No rebound. No HSM. EXTR: No c/c/e PSYCH: Normally interactive. Conversant.    Assessment and Plan: ***  Signed Harlene Schroeder, MD

## 2023-07-25 ENCOUNTER — Ambulatory Visit (INDEPENDENT_AMBULATORY_CARE_PROVIDER_SITE_OTHER): Admitting: Family Medicine

## 2023-07-25 ENCOUNTER — Telehealth: Payer: Self-pay

## 2023-07-25 ENCOUNTER — Encounter: Payer: Self-pay | Admitting: Family Medicine

## 2023-07-25 ENCOUNTER — Other Ambulatory Visit (HOSPITAL_COMMUNITY): Payer: Self-pay

## 2023-07-25 VITALS — BP 144/80 | HR 78 | Ht 62.0 in | Wt 126.4 lb

## 2023-07-25 DIAGNOSIS — R519 Headache, unspecified: Secondary | ICD-10-CM

## 2023-07-25 MED ORDER — NURTEC 75 MG PO TBDP
ORAL_TABLET | ORAL | 1 refills | Status: DC
Start: 2023-07-25 — End: 2023-08-23

## 2023-07-25 NOTE — Telephone Encounter (Signed)
 Pharmacy Patient Advocate Encounter   Received notification from CoverMyMeds that prior authorization for Nurtec 75MG  dispersible tablets  is required/requested.   Insurance verification completed.   The patient is insured through [facility] .   Per test claim: PA required; PA started via CoverMyMeds. KEY [id number] . Waiting for clinical questions to populate.

## 2023-07-25 NOTE — Patient Instructions (Signed)
 Good to see you today- let's try the Nurtec and let me know what you think

## 2023-07-26 NOTE — Telephone Encounter (Signed)
 Please be advised  Do you have a list of the ones she tried and failed? Could not tell which ones she had tried and failed from the chart.  Thanks

## 2023-07-26 NOTE — Telephone Encounter (Signed)
 Prior Authorization form/request asks a question that requires your assistance. PLEASE SEE THE QUESTION BELOW and advise accordingly. The PA WILL NOT BE submitted until the necessary information is RECEIVED.

## 2023-07-27 ENCOUNTER — Encounter: Payer: Self-pay | Admitting: Family Medicine

## 2023-07-27 ENCOUNTER — Other Ambulatory Visit (HOSPITAL_COMMUNITY): Payer: Self-pay

## 2023-07-27 DIAGNOSIS — R519 Headache, unspecified: Secondary | ICD-10-CM

## 2023-08-01 ENCOUNTER — Other Ambulatory Visit: Payer: Self-pay | Admitting: Family Medicine

## 2023-08-01 DIAGNOSIS — R519 Headache, unspecified: Secondary | ICD-10-CM

## 2023-08-01 NOTE — Telephone Encounter (Signed)
 Pt called back and I relayed Dr. Eleanore message. She stated that she has taken Tylenol , Fioricet, and Vicodin (occassionally) to treat her headaches.  She mentioned that she did fill the prescription 2 pills at a cast $125/ea    Please advise.

## 2023-08-01 NOTE — Telephone Encounter (Signed)
 Pharmacy Patient Advocate Encounter   Received notification from CoverMyMeds that prior authorization for Nurtec 75MG  dispersible tablets is required/requested.   Insurance verification completed.   The patient is insured through [facility] .   Per test claim: PA required; PA submitted to above mentioned insurance via CoverMyMeds Key/confirmation #/EOC [id number] NEW KEY Status is pending  Clinical questions have been answered and PA submitted.TO PLAN. PA currently Pending.

## 2023-08-01 NOTE — Telephone Encounter (Signed)
 Pt has tried and failed: Tylenol  #3, OTC Excedrin Migraine, Fioricet 50-325-40mg , Fiorinol w/ codeine  50-325-40mg , and nortriptyline  25 and 50mg , an OV note from 2016 mentioned she had tried and failed rizatriptan as well but I could never find a dosage.

## 2023-08-02 NOTE — Telephone Encounter (Signed)
 Pharmacy Patient Advocate Encounter  Received notification from [facility] that Prior Authorization for Nurtec 75MG  dispersible tablets  has been APPROVED from 08/01/2023 to 01/25/2024  SEE OUTCOME BELOW  Reference Number: [id number]. NURTEC TAB 75MG  ODT is approved through 01/25/2024. Your patient may now fill this prescription and it will be covered. Effective Date: 08/01/2023 Authorization Expiration Date: 01/25/2024

## 2023-08-04 ENCOUNTER — Other Ambulatory Visit: Payer: Self-pay | Admitting: Family Medicine

## 2023-08-04 DIAGNOSIS — Z760 Encounter for issue of repeat prescription: Secondary | ICD-10-CM

## 2023-08-09 ENCOUNTER — Other Ambulatory Visit (HOSPITAL_COMMUNITY): Payer: Self-pay | Admitting: Cardiology

## 2023-08-23 ENCOUNTER — Other Ambulatory Visit: Payer: Self-pay | Admitting: Family Medicine

## 2023-08-23 DIAGNOSIS — R519 Headache, unspecified: Secondary | ICD-10-CM

## 2023-08-24 MED ORDER — NURTEC 75 MG PO TBDP: ORAL_TABLET | ORAL | 3 refills | Status: AC

## 2023-08-24 NOTE — Addendum Note (Signed)
 Addended by: WATT RAISIN C on: 08/24/2023 03:02 AM   Modules accepted: Orders

## 2023-09-01 ENCOUNTER — Ambulatory Visit: Payer: Self-pay | Admitting: Cardiology

## 2023-09-16 ENCOUNTER — Ambulatory Visit: Attending: Cardiology | Admitting: Cardiology

## 2023-09-16 ENCOUNTER — Encounter: Payer: Self-pay | Admitting: Cardiology

## 2023-09-16 VITALS — BP 170/80 | HR 64 | Ht 62.0 in | Wt 124.6 lb

## 2023-09-16 DIAGNOSIS — I10 Essential (primary) hypertension: Secondary | ICD-10-CM | POA: Diagnosis not present

## 2023-09-16 DIAGNOSIS — E782 Mixed hyperlipidemia: Secondary | ICD-10-CM

## 2023-09-16 DIAGNOSIS — I480 Paroxysmal atrial fibrillation: Secondary | ICD-10-CM

## 2023-09-16 MED ORDER — EZETIMIBE 10 MG PO TABS: 10.0000 mg | ORAL_TABLET | Freq: Every day | ORAL | 2 refills | Status: AC

## 2023-09-16 MED ORDER — DILTIAZEM HCL ER COATED BEADS 360 MG PO CP24: 360.0000 mg | ORAL_CAPSULE | Freq: Every day | ORAL | 2 refills | Status: AC

## 2023-09-16 MED ORDER — LOSARTAN POTASSIUM 100 MG PO TABS: 100.0000 mg | ORAL_TABLET | Freq: Every day | ORAL | 3 refills | Status: DC

## 2023-09-16 MED ORDER — DILTIAZEM HCL 30 MG PO TABS: 30.0000 mg | ORAL_TABLET | ORAL | 3 refills | Status: AC | PRN

## 2023-09-16 MED ORDER — FLECAINIDE ACETATE 50 MG PO TABS: 50.0000 mg | ORAL_TABLET | Freq: Two times a day (BID) | ORAL | 2 refills | Status: AC

## 2023-09-16 MED ORDER — ROSUVASTATIN CALCIUM 10 MG PO TABS: 10.0000 mg | ORAL_TABLET | Freq: Every day | ORAL | 3 refills | Status: AC

## 2023-09-16 NOTE — Patient Instructions (Signed)
 Medication Instructions:  INCREASED Losartan  to 100 mg daily  INCREASED Crestor  to 10 mg daily   REFILLS sent in today   *If you need a refill on your cardiac medications before your next appointment, please call your pharmacy*  Lab Work: BMP IN 1 WEEK  If you have labs (blood work) drawn today and your tests are completely normal, you will receive your results only by: MyChart Message (if you have MyChart) OR A paper copy in the mail If you have any lab test that is abnormal or we need to change your treatment, we will call you to review the results.   Follow-Up: At [facility], you and your health needs are our priority.  As part of our continuing mission to provide you with exceptional heart care, our providers are all part of one team.  This team includes your primary Cardiologist (physician) and Advanced Practice Providers or APPs (Physician Assistants and Nurse Practitioners) who all work together to provide you with the care you need, when you need it.  Your next appointment:   4 week(s) WITH APP   6 months WITH DR. PATWARDHAN

## 2023-09-16 NOTE — Progress Notes (Signed)
 Cardiology Office Note:  .   Date:  09/16/2023  ID:  Kristy Arias, DOB 09-Nov-1950, MRN [id number] PCP: Watt Harlene JAYSON, MD  [facility] Providers Cardiologist:  Newman Lawrence, MD PCP: Watt Harlene JAYSON, MD  Chief Complaint  Patient presents with   Hypertension     Kristy Arias is a 73 y.o. female with hypertension, hyperlipidemia, hypothyroidism, paroxysmal A-fib s/p Watchman implant (01/2022)    History of Present Illness  Patient only has occasional palpitation symptoms, easily resolved with diltiazem  30 mg as needed, in addition to diltiazem  CD 360 mg daily.  Blood pressure has been recently uncontrolled.  She denies any chest pain, shortness of breath symptoms.     Vitals:   09/16/23 1455  BP: (!) 170/80  Pulse: 64      Review of Systems  Cardiovascular:  Negative for chest pain, dyspnea on exertion, leg swelling, palpitations and syncope.        Studies Reviewed: SABRA    Normal sinus rhythm Left axis deviation Inferior infarct , age undetermined Anteroseptal infarct (cited on or before 03-Jun-2022) When compared with ECG of 11-Jun-2022 09:03, Inferior infarct is now Present Questionable change in initial forces of Anterior leads Nonspecific T wave abnormality has replaced inverted T waves in Lateral leads    Labs 03/2023: Chol 207, TG 159, HDL 74, LDL 101 HbA1C 5.6% Hb 14.6 Cr 0.77 TSH 3.1  Risk Assessment/Calculations:    CHA2DS2-VASc Score = 3  This indicates a 3.2% annual risk of stroke. The patient's score is based upon: CHF History: 0 HTN History: 1 Diabetes History: 0 Stroke History: 0 Vascular Disease History: 0 Age Score: 1 Gender Score: 1    Physical Exam Vitals and nursing note reviewed.  Constitutional:      General: She is not in acute distress. Neck:     Vascular: No JVD.  Cardiovascular:     Rate and Rhythm: Normal rate and regular rhythm.     Heart sounds: Normal heart sounds. No murmur  heard. Pulmonary:     Effort: Pulmonary effort is normal.     Breath sounds: Normal breath sounds. No wheezing or rales.  Musculoskeletal:     Right lower leg: No edema.     Left lower leg: No edema.      VISIT DIAGNOSES:   ICD-10-CM   1. Paroxysmal atrial fibrillation (HCC)  I48.0 EKG 12-Lead    Basic metabolic panel with GFR    Basic metabolic panel with GFR    2. Essential hypertension  I10 EKG 12-Lead    losartan  (COZAAR ) 100 MG tablet    Basic metabolic panel with GFR    Basic metabolic panel with GFR    3. Mixed hyperlipidemia  E78.2 rosuvastatin  (CRESTOR ) 10 MG tablet    ezetimibe  (ZETIA ) 10 MG tablet       Kristy Arias is a 73 y.o. female with hypertension, hyperlipidemia, hypothyroidism, paroxysmal A-fib s/p Watchman implant (01/2022)  Assessment & Plan  PAF: Currently on Flecainide  50 mg bid, diltiazem  360 mg daily, losartan  50 mg daily CHA2DS2-VASc score 4, annual stroke risk 5% S/p Watchman placement (01/2022).  Hypertension: Uncontrolled. Increase losartan  to 100 mg daily.  Check BMP in 1 week. Continue diltiazem  CD3 160 mg daily. F/u in 4 to 6 weeks for medication up titration as needed.  Mixed hyperlipidemia: Continue Crestor  10 mg daily, Zetia  10 mg daily.      Meds ordered this encounter  Medications   losartan  (COZAAR ) 100 MG  tablet    Sig: Take 1 tablet (100 mg total) by mouth daily.    Dispense:  90 tablet    Refill:  3   rosuvastatin  (CRESTOR ) 10 MG tablet    Sig: Take 1 tablet (10 mg total) by mouth daily.    Dispense:  90 tablet    Refill:  3   flecainide  (TAMBOCOR ) 50 MG tablet    Sig: Take 1 tablet (50 mg total) by mouth 2 (two) times daily.    Dispense:  180 tablet    Refill:  2    Please call our office to schedule an yearly appointment with Dr. Elmira for August 2025 before anymore refills. [phone]. Thank you 1st attempt   ezetimibe  (ZETIA ) 10 MG tablet    Sig: Take 1 tablet (10 mg total) by mouth daily.    Dispense:   90 tablet    Refill:  2   diltiazem  (CARDIZEM ) 30 MG tablet    Sig: Take 1 tablet (30 mg total) by mouth as needed (for Afib or palpitations).    Dispense:  45 tablet    Refill:  3   diltiazem  (CARDIZEM  CD) 360 MG 24 hr capsule    Sig: Take 1 capsule (360 mg total) by mouth daily.    Dispense:  90 capsule    Refill:  2    Patient is part of our medsync program on a 90 day cycle. If appropriate please send refills on a 90 day cycle. Thank you     F/u in 4-6 weeks for medication up titration F/u in 6 months with me  Signed, Newman JINNY Elmira, MD

## 2023-09-23 ENCOUNTER — Telehealth: Payer: Self-pay

## 2023-09-23 NOTE — Telephone Encounter (Signed)
 Pt going to [place]

## 2023-09-23 NOTE — Telephone Encounter (Signed)
 Initial Comment Caller states she did an online appointment for 09/08 , and received a call from the office saying she needs to speak with a nurse for medical advice. Pt was experiencing dizziness. GOTO Facility Not Listed [facility] off [address] Translation No Nurse Assessment Nurse: Lenon, RN, Suzen Date/Time Titus Time): 09/23/2023 2:19:24 PM Confirm and document reason for call. If symptomatic, describe symptoms. ---Caller states that she has Hx of a-fib and watchman. States she saw cardiologist last week. Reports increasing frequency of a-fib this week. Monday lasted longer than usual, and took two doses of Cardizem  to return to NSR. Wednesday had PT and turned from supine to left side and said ceiling was swimming. Has a device to check EKG and was noted to be in NSR. Increased Lopressor from 50mg  to 100mg  and Rosuvastatin  increased to daily. Last blood pressure 148/75 (usually runs higher ) Blood pressures have ranged from 166/93 -128/52 this week. Has appt scheduled for Sept 8, 2025 with PCP. Not having dizziness currently. Does the patient have any new or worsening symptoms? ---Yes Will a triage be completed? ---Yes Related visit to physician within the last 2 weeks? ---Yes Does the PT have any chronic conditions? (i.e. diabetes, asthma, this includes High risk factors for pregnancy, etc.) ---Yes List chronic conditions. ---A-fib (with SOB), high cholesterol, HTN, knee replacement x 2, tear in right gluteal tendon Is this a behavioral health or substance abuse call? ---No PLEASE NOTE: All timestamps contained within this report are represented as [place] Standard Time. CONFIDENTIALTY NOTICE: This fax transmission is intended only for the addressee. It contains information that is legally privileged, confidential or otherwise protected from use or disclosure. If you are not the intended recipient, you are strictly prohibited from reviewing, disclosing,  copying using or disseminating any of this information or taking any action in reliance on or regarding this information. If you have received this fax in error, please notify us  immediately by telephone so that we can arrange for its return to us . Phone: [phone], Toll-Free: [phone], Fax: [phone] [email or web address] [birth year] Page: 1 of2 CallId: [id number] Guidelines Guideline Title Affirmed Question Affirmed Notes Nurse Date/Time Titus Time) Dizziness - Vertigo [1] MODERATE dizziness (e.g., vertigo; feels very unsteady, interferes with normal activities) AND [2] has NOT been evaluated by doctor (or NP/PA) for this Lenon OBIE Suzen 09/23/2023 2:31:04 PM Disp. Time Titus Time) Disposition Final User 09/23/2023 2:39:47 PM See PCP within 24 Hours Yes Lenon RN, Suzen Final Disposition 09/23/2023 2:39:47 PM See PCP within 24 Hours Yes Lenon, RN, Suzen [place] Disagree/Comply Comply Caller Understands Yes PreDisposition Home Care Care Advice Given Per Guideline SEE PCP WITHIN 24 HOURS: * IF OFFICE WILL BE OPEN: You need to be examined within the next 24 hours. Call your doctor (or NP/PA) when the office opens and make an appointment. CALL BACK IF: * Severe headache occurs * Weakness develops in an arm or leg * Unable to walk without falling * You become worse CARE ADVICE given per Dizziness - Vertigo (Adult) guideline. Comments User: Suzen Lenon, RN Date/Time Titus Time): 09/23/2023 2:31:01 PM Was pulling laundry out of dryer, felt herself leaning toward left and states if she does not hold on she will fall. User: Suzen Lenon, RN Date/Time Titus Time): 09/23/2023 2:32:50 PM Reports worsening headaches on Nurtec. User: Suzen Lenon, RN Date/Time Titus Time): 09/23/2023 2:38:10 PM Attempted to reach office backline to have pt seen today before close of business. No appts available. Referrals GO TO FACILITY OTHER - SPECIFY

## 2023-09-24 ENCOUNTER — Ambulatory Visit
Admission: RE | Admit: 2023-09-24 | Discharge: 2023-09-24 | Disposition: A | Discharge status: Home / Self Care | Payer: Self-pay | Source: Ambulatory Visit | Attending: Family Medicine | Admitting: Family Medicine

## 2023-09-24 VITALS — BP 190/90 | HR 62 | Temp 98.3°F | Resp 16

## 2023-09-24 DIAGNOSIS — I4891 Unspecified atrial fibrillation: Secondary | ICD-10-CM | POA: Diagnosis not present

## 2023-09-24 DIAGNOSIS — H8112 Benign paroxysmal vertigo, left ear: Secondary | ICD-10-CM

## 2023-09-24 MED ORDER — DIAZEPAM 5 MG PO TABS: 5.0000 mg | ORAL_TABLET | Freq: Every day | ORAL | 0 refills | Status: DC | PRN

## 2023-09-24 MED ORDER — MECLIZINE HCL 12.5 MG PO TABS: 12.5000 mg | ORAL_TABLET | Freq: Three times a day (TID) | ORAL | 0 refills | Status: AC | PRN

## 2023-09-24 NOTE — ED Provider Notes (Signed)
 [facility]  Note:  This document was prepared using Conservation officer, historic buildings and may include unintentional dictation errors.  MRN: [id number] DOB: 1950-04-21  Subjective:   Kristy Arias is a 73 y.o. female presenting for 3-day history of persistent intermittent vertigo, dizziness.  Symptoms are worse when she leans her head to the left or turns her head rapidly, rises from a laying or sitting position.  Symptoms resolved with remaining very still and slower movements.  Has a history of atrial fibrillation but this feels different for the patient. She is not compliant with her medications. No headache, confusion, heart racing, palpitations.   No current facility-administered medications for this encounter.  Current Outpatient Medications:    ALPRAZolam  (XANAX ) 0.5 MG tablet, TAKE 1 TABLET BY MOUTH 2 TIMES DAILY AS NEEDED FOR ANXIETY, Disp: 60 tablet, Rfl: 1   butalbital -acetaminophen -caffeine  (FIORICET) 50-325-40 MG tablet, TAKE 1 TABLET BY MOUTH 2 TIMES DAILY AS NEEDED, Disp: 60 tablet, Rfl: 1   calcium  elemental as carbonate (BARIATRIC TUMS ULTRA) 400 MG chewable tablet, Chew 1 tablet by mouth 2 (two) times daily., Disp: , Rfl:    cetirizine (ZYRTEC) 10 MG tablet, Take 10 mg by mouth daily., Disp: , Rfl:    Cholecalciferol (VITAMIN D3) 50 MCG (2000 UT) capsule, Take 2,000 Units by mouth in the morning., Disp: , Rfl:    Cyanocobalamin (B-12 SL), Take 1,000 mcg by mouth in the morning., Disp: , Rfl:    diclofenac  (VOLTAREN ) 75 MG EC tablet, TAKE 1 TABLET BY MOUTH 2 TIMES DAILY, Disp: 180 tablet, Rfl: 1   diltiazem  (CARDIZEM  CD) 360 MG 24 hr capsule, Take 1 capsule (360 mg total) by mouth daily., Disp: 90 capsule, Rfl: 2   diltiazem  (CARDIZEM ) 30 MG tablet, Take 1 tablet (30 mg total) by mouth as needed (for Afib or palpitations)., Disp: 45 tablet, Rfl: 3   estradiol (VIVELLE-DOT) 0.1 MG/24HR patch, APPLY 1 PATCH TWICE A WEEK, Disp: 8 patch, Rfl: 12    ezetimibe  (ZETIA ) 10 MG tablet, Take 1 tablet (10 mg total) by mouth daily., Disp: 90 tablet, Rfl: 2   flecainide  (TAMBOCOR ) 50 MG tablet, Take 1 tablet (50 mg total) by mouth 2 (two) times daily., Disp: 180 tablet, Rfl: 2   hydroxypropyl methylcellulose / hypromellose (ISOPTO TEARS / GONIOVISC) 2.5 % ophthalmic solution, Place 1 drop into both eyes daily as needed for dry eyes. (Patient not taking: Reported on 09/16/2023), Disp: , Rfl:    hydrOXYzine  (VISTARIL ) 25 MG capsule, Take 1 capsule (25 mg total) by mouth at bedtime as needed., Disp: 90 capsule, Rfl: 2   levothyroxine  (SYNTHROID ) 50 MCG tablet, Take 1 tablet (50 mcg total) by mouth daily before breakfast., Disp: 90 tablet, Rfl: 3   losartan  (COZAAR ) 100 MG tablet, Take 1 tablet (100 mg total) by mouth daily., Disp: 90 tablet, Rfl: 3   Misc Natural Products (ELDERBERRY ZINC/VIT C/IMMUNE MT), Use as directed 1 tablet in the mouth or throat as needed., Disp: , Rfl:    OVER THE COUNTER MEDICATION, Take 1 capsule by mouth at bedtime. Delta 8, Disp: , Rfl:    Rimegepant Sulfate (NURTEC) 75 MG TBDP, Take one by mouth every other day for headache prevention, Disp: 45 tablet, Rfl: 3   rosuvastatin  (CRESTOR ) 10 MG tablet, Take 1 tablet (10 mg total) by mouth daily., Disp: 90 tablet, Rfl: 3   venlafaxine  XR (EFFEXOR -XR) 37.5 MG 24 hr capsule, Take 2 capsules (75 mg total) by mouth daily with  breakfast., Disp: 180 capsule, Rfl: 2   No Known Allergies  Past Medical History:  Diagnosis Date   Anemia    Anxiety    Arthritis    Depression    GERD (gastroesophageal reflux disease)    Glaucoma    Headache    Hyperlipidemia    Hypertension    Hypothyroidism    Presence of Watchman left atrial appendage closure device 02/04/2022   s/p LAAO with a 27 mm Watchman FLX by Dr Cindie   Syndrome X, cardiac [facility])      Past Surgical History:  Procedure Laterality Date   ABDOMINAL HYSTERECTOMY     ATRIAL FIBRILLATION ABLATION N/A 10/23/2021   Procedure:  ATRIAL FIBRILLATION ABLATION;  Surgeon: Cindie Ole DASEN, MD;  Location: [facility] INVASIVE CV LAB;  Service: Cardiovascular;  Laterality: N/A;   BREAST ENHANCEMENT SURGERY     BREAST SURGERY     CARDIAC CATHETERIZATION  4/14   CHOLECYSTECTOMY     COSMETIC SURGERY     DILATION AND CURETTAGE OF UTERUS  2013   EYE SURGERY     LAPAROSCOPIC CHOLECYSTECTOMY SINGLE PORT N/A 06/29/2012   Procedure: LAPAROSCOPIC CHOLECYSTECTOMY SINGLE PORT IOC ;  Surgeon: Elspeth KYM Schultze, MD;  Location: [facility] ORS;  Service: General;  Laterality: N/A;   LEFT ATRIAL APPENDAGE OCCLUSION N/A 02/04/2022   Procedure: LEFT ATRIAL APPENDAGE OCCLUSION;  Surgeon: Cindie Ole DASEN, MD;  Location: [facility] INVAS[facility] CV [facility];  Service: Cardiovascular;  Laterality: N/A;   LEFT HEART CATHETERIZATION WITH CORONARY ANGIOGRAM N/A 05/23/2012   Procedure: LEFT HEART CATHETERIZATION WITH CORONARY ANGIOGRAM;  Surgeon: Erick JONELLE Bergamo, MD;  Location: [facility] CATH LAB;  Service: Cardiovascular;  Laterality: N/A;   TEE WITHOUT CARDIOVERSION N/A 02/04/2022   Procedure: TRANSESOPHAGEAL ECHOCARDIOGRAM (TEE);  Surgeon: Cindie Ole DASEN, MD;  Location: [facility] INVASIVE CV LAB;  Service: Cardiovascular;  Laterality: N/A;   TOTAL KNEE ARTHROPLASTY Right 08/25/2015   Procedure: RIGHT TOTAL KNEE ARTHROPLASTY;  Surgeon: Dempsey Moan, MD;  Location: [facility] OR[facility];  Service: Orthopedics;  Laterality: Right;   TOTAL KNEE ARTHROPLASTY Left 08/09/2016   Procedure: LEFT TOTAL KNEE ARTHROPLASTY;  Surgeon: Moan Dempsey, MD;  Location: [facility] OR[facility];  Service: Orthopedics;  Laterality: Left;    Family History  Problem Relation Age of Onset   Cancer Mother    Hyperlipidemia Mother    Hypertension Mother    Hypertension Father    Diabetes Father    Heart disease Father    Hyperlipidemia Father    Cancer Sister    Hypertension Maternal Grandmother    Heart disease Maternal Grandmother    Diabetes Maternal Grandfather    Hyperlipidemia Maternal Grandfather    Heart disease Maternal  Grandfather    Osteoporosis Paternal Grandmother    Heart disease Paternal Grandmother    Heart disease Paternal Grandfather     Social History   Tobacco Use   Smoking status: Never   Smokeless tobacco: Never   Tobacco comments:    Never smoke 11/20/21  Vaping Use   Vaping status: Never Used  Substance Use Topics   Alcohol use: No   Drug use: No    ROS   Objective:   Vitals: BP (!) 190/90   Pulse 62   Temp 98.3 F (36.8 C)   Resp 16   SpO2 97%   Physical Exam Constitutional:      General: She is not in acute distress.    Appearance: Normal appearance. She is well-developed. She is not ill-appearing, toxic-appearing or diaphoretic.  HENT:     Head: Normocephalic and atraumatic.     Right Ear: Tympanic membrane, ear canal and external ear normal. No tenderness. There is no impacted cerumen. Tympanic membrane is not injected, perforated, erythematous or bulging.     Left Ear: Tympanic membrane, ear canal and external ear normal. No tenderness. There is no impacted cerumen. Tympanic membrane is not injected, perforated, erythematous or bulging.     Nose: Nose normal.     Mouth/Throat:     Mouth: Mucous membranes are moist.  Eyes:     General: No scleral icterus.       Right eye: No discharge.        Left eye: No discharge.     Extraocular Movements: Extraocular movements intact.     Conjunctiva/sclera: Conjunctivae normal.     Pupils: Pupils are equal, round, and reactive to light.  Cardiovascular:     Rate and Rhythm: Normal rate and regular rhythm.     Heart sounds: Normal heart sounds. No murmur heard.    No friction rub. No gallop.  Pulmonary:     Effort: Pulmonary effort is normal. No respiratory distress.     Breath sounds: No stridor. No wheezing, rhonchi or rales.  Chest:     Chest wall: No tenderness.  Skin:    General: Skin is warm and dry.  Neurological:     General: No focal deficit present.     Mental Status: She is alert and oriented to  person, place, and time.     Cranial Nerves: No cranial nerve deficit.     Motor: No weakness.     Coordination: Coordination normal.     Gait: Gait normal.  Psychiatric:        Mood and Affect: Mood normal.        Behavior: Behavior normal.     ED ECG REPORT   Date: 09/24/2023  EKG Time: 9:48 AM  Rate: 62bpm  Rhythm: normal sinus rhythm,  normal EKG, normal sinus rhythm  Axis: left  Intervals:none  ST&T Change: T wave flattening in lead aVL and V3, T wave inversion in V2  Narrative Interpretation: Sinus rhythm at 62 bpm with nonspecific T wave changes as above improved from previous EKG.   Assessment and Plan :   I have reviewed the PDMP during this encounter.  1. BPPV (benign paroxysmal positional vertigo), left   2. Atrial fibrillation, unspecified type (HCC)    Recommended managing for BPPV with Valium  or meclizine .  General management discussed.  Counseled patient on potential for adverse effects with medications prescribed/recommended today, ER and return-to-clinic precautions discussed, patient verbalized understanding.    Christopher Savannah, PA-C 09/24/23 1330

## 2023-09-24 NOTE — ED Triage Notes (Signed)
 Pt c/o a new dizzy started 8/27 while in PT when she turned on her left side-2 other episodes if she turns on her left side or turns head to the left I get swimmy headed-denies pain-NAD-steady gait

## 2023-09-27 LAB — BASIC METABOLIC PANEL WITH GFR
BUN/Creatinine Ratio: 18 (ref 12–28)
BUN: 15 mg/dL (ref 8–27)
CO2: 19 mmol/L — ABNORMAL LOW (ref 20–29)
Calcium: 9.4 mg/dL (ref 8.7–10.3)
Chloride: 101 mmol/L (ref 96–106)
Creatinine, Ser: 0.83 mg/dL (ref 0.57–1.00)
Glucose: 138 mg/dL — ABNORMAL HIGH (ref 70–99)
Potassium: 4.1 mmol/L (ref 3.5–5.2)
Sodium: 138 mmol/L (ref 134–144)
eGFR: 75 mL/min/1.73 (ref >59)

## 2023-09-29 NOTE — Progress Notes (Deleted)
 [facility] at [facility] [address] [place], [place] [address] [phone]  Date:  10/03/2023   Name:  Kristy Arias   DOB:  January 11, 1951   MRN:  [id number]  PCP:  Watt Harlene JAYSON, MD    Chief Complaint: No chief complaint on file.   History of Present Illness:  Kristy Arias is a 73 y.o. very pleasant female patient who presents with the following:  Patient seen today with concern of dizziness.  I saw her most recently in June for concern of worsening headaches History of hypertension, migraine, hyperlipidemia, hypothyroidism , open angle glaucoma followed by optho, atrial fibrillation  She had surgery for her glaucoma nad no longer needs treatment  She has a Watchman and also underwent LAAO closure per Dr. Cindie   She was seen in cardiology clinic last month: PAF: Currently on Flecainide  50 mg bid, diltiazem  360 mg daily, losartan  50 mg daily CHA2DS2-VASc score 4, annual stroke risk 5% S/p Watchman placement (01/2022). Hypertension: Uncontrolled. Increase losartan  to 100 mg daily.  Check BMP in 1 week. Continue diltiazem  CD3 160 mg daily. F/u in 4 to 6 weeks for medication up titration as needed. Mixed hyperlipidemia: Continue Crestor  10 mg daily, Zetia  10 mg daily.  Seen at [facility] health urgent care on 8/30 with concern of vertigo/dizziness She was diagnosed with BPPV follow-up as needed    Flu shot Recommend COVID booster this fall Cologuard -2020, she also had a colonoscopy in 2023  Discussed the use of AI scribe software for clinical note transcription with the patient, who gave verbal consent to proceed.  History of Present Illness    Patient Active Problem List   Diagnosis Date Noted   Osteopenia 09/23/2022   Atrial fibrillation, unspecified type (HCC) 02/04/2022   Presence of Watchman left atrial appendage closure device 02/04/2022   Hypercoagulable state due to paroxysmal atrial fibrillation (HCC)  11/20/2021   Paroxysmal atrial fibrillation (HCC)    Hypothyroidism (acquired) 02/26/2019   OA (osteoarthritis) of knee 08/25/2015   Memory loss 06/05/2014   Chronic migraine without aura without status migrainosus, not intractable 06/05/2014   Essential hypertension 11/02/2011   Fibromuscular dysplasia (HCC) 11/02/2011   Hyperlipidemia 11/02/2011   Depression 11/02/2011    Past Medical History:  Diagnosis Date   Anemia    Anxiety    Arthritis    Depression    GERD (gastroesophageal reflux disease)    Glaucoma    Headache    Hyperlipidemia    Hypertension    Hypothyroidism    Presence of Watchman left atrial appendage closure device 02/04/2022   s/p LAAO with a 27 mm Watchman FLX by Dr Cindie   Syndrome X, cardiac [facility])     Past Surgical History:  Procedure Laterality Date   ABDOMINAL HYSTERECTOMY     ATRIAL FIBRILLATION ABLATION N/A 10/23/2021   Procedure: ATRIAL FIBRILLATION ABLATION;  Surgeon: Cindie Ole DASEN, MD;  [facility] INV[facility];  Service: Cardiovascular;  Laterality: N/A;   BREAST ENHANCEMENT SURGERY     BREAST SURGERY     CARDIAC CATHETERIZATION  4/14   CHOLECYSTECTOMY     COSMETIC SURGERY     DILATION AND CURETTAGE OF UTERUS  2013   EYE SURGERY     LAPAROSCOPIC CHOLECYSTECTOMY SINGLE PORT N/A 06/29/2012   Procedure: LAPAROSCOPIC CHOLECYSTECTOMY SINGLE PORT IOC ;  Surgeon: Elspeth KYM Schultze, MD;  Location: [facility] ORS;  Service: General;  Laterality: N/A;  LEFT ATRIAL APPENDAGE OCCLUSION N/A 02/04/2022   Procedure: LEFT ATRIAL APPENDAGE OCCLUSION;  Surgeon: Cindie Ole DASEN, MD;  [facility]: [facility];  Service: Cardiovascular;  Laterality: N/A;   LEFT HEART CATHETERIZATION WITH CORONARY ANGIOGRAM N/A 05/23/2012   Procedure: LEFT HEART CATHETERIZATION WITH CORONARY ANGIOGRAM;  Surgeon: Erick JONELLE Bergamo, MD;  Location: [facility] CATH LAB;  Service: Cardiovascular;  Laterality: N/A;   TEE WITHOUT CARDIOVERSION N/A 02/04/2022   Procedure: TRANSESOPHAGEAL  ECHOCARDIOGRAM (TEE);  Surgeon: Cindie Ole DASEN, MD;  Location: [facility] INVASIVE CV LAB;  Service: Cardiovascular;  Laterality: N/A;   TOTAL KNEE ARTHROPLASTY Right 08/25/2015   Procedure: RIGHT TOTAL KNEE ARTHROPLASTY;  Surgeon: Dempsey Moan, MD;  Location: [facility];  Service: Orthopedics;  Laterality: Right;   TOTAL KNEE ARTHROPLASTY Left 08/09/2016   Procedure: LEFT TOTAL KNEE ARTHROPLASTY;  Surgeon: Moan Dempsey, MD;  Location: [facility];  Service: Orthopedics;  Laterality: Left;    Social History   Tobacco Use   Smoking status: Never   Smokeless tobacco: Never   Tobacco comments:    Never smoke 11/20/21  Vaping Use   Vaping status: Never Used  Substance Use Topics   Alcohol use: No   Drug use: No    Family History  Problem Relation Age of Onset   Cancer Mother    Hyperlipidemia Mother    Hypertension Mother    Hypertension Father    Diabetes Father    Heart disease Father    Hyperlipidemia Father    Cancer Sister    Hypertension Maternal Grandmother    Heart disease Maternal Grandmother    Diabetes Maternal Grandfather    Hyperlipidemia Maternal Grandfather    Heart disease Maternal Grandfather    Osteoporosis Paternal Grandmother    Heart disease Paternal Grandmother    Heart disease Paternal Grandfather     No Known Allergies  Medication list has been reviewed and updated.  Current Outpatient Medications on File Prior to Visit  Medication Sig Dispense Refill   ALPRAZolam  (XANAX ) 0.5 MG tablet TAKE 1 TABLET BY MOUTH 2 TIMES DAILY AS NEEDED FOR ANXIETY 60 tablet 1   butalbital -acetaminophen -caffeine  (FIORICET) 50-325-40 MG tablet TAKE 1 TABLET BY MOUTH 2 TIMES DAILY AS NEEDED 60 tablet 1   calcium  elemental as carbonate (BARIATRIC TUMS ULTRA) 400 MG chewable tablet Chew 1 tablet by mouth 2 (two) times daily.     cetirizine (ZYRTEC) 10 MG tablet Take 10 mg by mouth daily.     Cholecalciferol (VITAMIN D3) 50 MCG (2000 UT) capsule Take 2,000 Units by mouth in the  morning.     Cyanocobalamin (B-12 SL) Take 1,000 mcg by mouth in the morning.     diazepam  (VALIUM ) 5 MG tablet Take 1 tablet (5 mg total) by mouth daily as needed (for vertigo). 3 tablet 0   diclofenac  (VOLTAREN ) 75 MG EC tablet TAKE 1 TABLET BY MOUTH 2 TIMES DAILY 180 tablet 1   diltiazem  (CARDIZEM  CD) 360 MG 24 hr capsule Take 1 capsule (360 mg total) by mouth daily. 90 capsule 2   diltiazem  (CARDIZEM ) 30 MG tablet Take 1 tablet (30 mg total) by mouth as needed (for Afib or palpitations). 45 tablet 3   estradiol (VIVELLE-DOT) 0.1 MG/24HR patch APPLY 1 PATCH TWICE A WEEK 8 patch 12   ezetimibe  (ZETIA ) 10 MG tablet Take 1 tablet (10 mg total) by mouth daily. 90 tablet 2   flecainide  (TAMBOCOR ) 50 MG tablet Take 1 tablet (50 mg total) by mouth 2 (two) times daily.  180 tablet 2   hydroxypropyl methylcellulose / hypromellose (ISOPTO TEARS / GONIOVISC) 2.5 % ophthalmic solution Place 1 drop into both eyes daily as needed for dry eyes. (Patient not taking: Reported on 09/16/2023)     hydrOXYzine  (VISTARIL ) 25 MG capsule Take 1 capsule (25 mg total) by mouth at bedtime as needed. 90 capsule 2   levothyroxine  (SYNTHROID ) 50 MCG tablet Take 1 tablet (50 mcg total) by mouth daily before breakfast. 90 tablet 3   losartan  (COZAAR ) 100 MG tablet Take 1 tablet (100 mg total) by mouth daily. 90 tablet 3   meclizine  (ANTIVERT ) 12.5 MG tablet Take 1 tablet (12.5 mg total) by mouth 3 (three) times daily as needed for dizziness. 30 tablet 0   Misc Natural Products (ELDERBERRY ZINC/VIT C/IMMUNE MT) Use as directed 1 tablet in the mouth or throat as needed.     OVER THE COUNTER MEDICATION Take 1 capsule by mouth at bedtime. Delta 8     Rimegepant Sulfate (NURTEC) 75 MG TBDP Take one by mouth every other day for headache prevention 45 tablet 3   rosuvastatin  (CRESTOR ) 10 MG tablet Take 1 tablet (10 mg total) by mouth daily. 90 tablet 3   venlafaxine  XR (EFFEXOR -XR) 37.5 MG 24 hr capsule Take 2 capsules (75 mg total) by  mouth daily with breakfast. 180 capsule 2   No current facility-administered medications on file prior to visit.    Review of Systems:  As per HPI- otherwise negative.   Physical Examination: There were no vitals filed for this visit. There were no vitals filed for this visit. There is no height or weight on file to calculate BMI. Ideal Body Weight:    GEN: no acute distress. HEENT: Atraumatic, Normocephalic.  Ears and Nose: No external deformity. CV: RRR, No M/G/R. No JVD. No thrill. No extra heart sounds. PULM: CTA B, no wheezes, crackles, rhonchi. No retractions. No resp. distress. No accessory muscle use. ABD: S, NT, ND, +BS. No rebound. No HSM. EXTR: No c/c/e PSYCH: Normally interactive. Conversant.    Assessment and Plan: No diagnosis found.  Assessment & Plan   Signed Harlene Schroeder, MD

## 2023-10-03 ENCOUNTER — Ambulatory Visit: Admitting: Family Medicine

## 2023-10-08 NOTE — Progress Notes (Signed)
 [facility] at [facility] [address], [address] [place], [place] [address] [phone]  Date:  10/12/2023   Name:  Kristy Arias   DOB:  1950-05-22   MRN:  [id number]  PCP:  Watt Harlene JAYSON, MD    Chief Complaint: Dizziness (Onset it was very sudden, the actually dizziness happened on 09/21/2023 /Pt states she would also like to discuss headaches )   History of Present Illness:  Kristy Arias is a 73 y.o. very pleasant female patient who presents with the following:  Patient seen today with concern of dizziness.  I saw her most recently in June for concern of worsening headaches History of hypertension, migraine, hyperlipidemia, hypothyroidism , open angle glaucoma followed by optho, atrial fibrillation  She had surgery for her glaucoma nad no longer needs treatment  She has a Watchman and also underwent LAAO closure per Dr. Cindie   She was seen in cardiology clinic last month: PAF: Currently on Flecainide  50 mg bid, diltiazem  360 mg daily, losartan  50 mg daily CHA2DS2-VASc score 4, annual stroke risk 5% S/p Watchman placement (01/2022). Hypertension: Uncontrolled. Increase losartan  to 100 mg daily.  Check BMP in 1 week. Continue diltiazem  CD3 160 mg daily. F/u in 4 to 6 weeks for medication up titration as needed. Mixed hyperlipidemia: Continue Crestor  10 mg daily, Zetia  10 mg daily.  Seen at [facility] health urgent care on 8/30 with concern of vertigo/dizziness She was diagnosed with BPPV follow-up as needed    Flu shot- she plans to do later this fall Recommend COVID booster this fall Cologuard -2020, she also had a colonoscopy in 2023  Discussed the use of AI scribe software for clinical note transcription with the patient, who gave verbal consent to proceed.  History of Present Illness Kristy Arias is a 73 year old female who presents with vertigo and recurrent headaches.  She experienced a severe episode of vertigo  while performing physical therapy exercises, particularly during right leg raises and turning onto her left side. The vertigo was described as a sensation of the world spinning, which subsided upon changing position. She visited urgent care and was dx with BPPV, prescribed Valium , one a day for three days, and meclizine  as needed. The vertigo improved significantly within a week, with only one severe episode and occasional dizziness when bending down. She continues to use meclizine  preventatively on busy days.  She has been taking Nurtec every other day for headaches, which initially provided relief. However, after a trip to [place] in early August, she developed a sinus infection that lasted three weeks, during which her headaches returned. She has been using Tylenol  and Fioricet for headache relief, taking Fioricet approximately twice a week. She started Nurtec in June and noted improvement in the first month before the sinus infection.  She has a history of atrial fibrillation but did not experience any episodes during the vertigo. She tested negative for COVID-19 after her trip. She wondered if her recent sinus infection and travel could have contributed to her vertigo.  She has a follow-up appointment with a neurologist to discuss concerns about Alzheimer's and potential ADD medication to help with concentration and memory. She is cautious about medications affecting her sleep, as she already experiences sleep difficulties.  No slurred speech or other neurological symptoms during episodes of dizziness.   Patient Active Problem List   Diagnosis Date Noted   Osteopenia 09/23/2022   Atrial fibrillation, unspecified  type (HCC) 02/04/2022   Presence of Watchman left atrial appendage closure device 02/04/2022   Hypercoagulable state due to paroxysmal atrial fibrillation (HCC) 11/20/2021   Paroxysmal atrial fibrillation (HCC)    Hypothyroidism (acquired) 02/26/2019   OA (osteoarthritis) of  knee 08/25/2015   Memory loss 06/05/2014   Chronic migraine without aura without status migrainosus, not intractable 06/05/2014   Essential hypertension 11/02/2011   Fibromuscular dysplasia (HCC) 11/02/2011   Hyperlipidemia 11/02/2011   Depression 11/02/2011    Past Medical History:  Diagnosis Date   Anemia    Anxiety    Arthritis    Depression    GERD (gastroesophageal reflux disease)    Glaucoma    Headache    Hyperlipidemia    Hypertension    Hypothyroidism    Presence of Watchman left atrial appendage closure device 02/04/2022   s/p LAAO with a 27 mm Watchman FLX by Dr Cindie   Syndrome X, cardiac [facility])     Past Surgical History:  Procedure Laterality Date   ABDOMINAL HYSTERECTOMY     ATRIAL FIBRILLATION ABLATION N/A 10/23/2021   Procedure: ATRIAL FIBRILLATION ABLATION;  Surgeon: Cindie Ole DASEN, MD;  [facility]: [facility] INVASIVE CV LAB;  Service: Cardiovascular;  Laterality: N/A;   BREAST ENHANCEMENT SURGERY     BREAST SURGERY     CARDIAC CATHETERIZATION  4/14   CHOLECYSTECTOMY     COSMETIC SURGERY     DILATION AND CURETTAGE OF UTERUS  2013   EYE SURGERY     LAPAROSCOPIC CHOLECYSTECTOMY SINGLE PORT N/A 06/29/2012   Procedure: LAPAROSCOPIC CHOLECYSTECTOMY SINGLE PORT IOC ;  Surgeon: Elspeth KYM Schultze, MD;  Location: [facility];  Service: General;  Laterality: N/A;   LEFT ATRIAL APPENDAGE OCCLUSION N/A 02/04/2022   Procedure: LEFT ATRIAL APPENDAGE OCCLUSION;  Surgeon: Cindie Ole DASEN, MD;  [facility]: [facility] CV [facility];  Service: Cardiovascular;  Laterality: N/A;   LEFT HEART CATHETERIZATION WITH CORONARY ANGIOGRAM N/A 05/23/2012   Procedure: LEFT HEART CATHETERIZATION WITH CORONARY ANGIOGRAM;  Surgeon: Erick JONELLE Bergamo, MD;  Location: [facility] CATH LAB;  Service: Cardiovascular;  Laterality: N/A;   TEE WITHOUT CARDIOVERSION N/A 02/04/2022   Procedure: TRANSESOPHAGEAL ECHOCARDIOGRAM (TEE);  Surgeon: Cindie Ole DASEN, MD;  Location[facility] INVASIVE CV LAB;  Service: Cardiovascular;   Laterality: N/A;   TOTAL KNEE ARTHROPLASTY Right 08/25/2015   Procedure: RIGHT TOTAL KNEE ARTHROPLASTY;  Surgeon: Dempsey Moan, MD;  Location: [facility];  Service: Orthopedics;  Laterality: Right;   TOTAL KNEE ARTHROPLASTY Left 08/09/2016   Procedure: LEFT TOTAL KNEE ARTHROPLASTY;  Surgeon: Moan Dempsey, MD;  Location: [facility];  Service: Orthopedics;  Laterality: Left;    Social History   Tobacco Use   Smoking status: Never   Smokeless tobacco: Never   Tobacco comments:    Never smoke 11/20/21  Vaping Use   Vaping status: Never Used  Substance Use Topics   Alcohol use: No   Drug use: No    Family History  Problem Relation Age of Onset   Cancer Mother    Hyperlipidemia Mother    Hypertension Mother    Hypertension Father    Diabetes Father    Heart disease Father    Hyperlipidemia Father    Cancer Sister    Hypertension Maternal Grandmother    Heart disease Maternal Grandmother    Diabetes Maternal Grandfather    Hyperlipidemia Maternal Grandfather    Heart disease Maternal Grandfather    Osteoporosis Paternal Grandmother    Heart disease Paternal Grandmother    Heart  disease Paternal Grandfather     No Known Allergies  Medication list has been reviewed and updated.  Current Outpatient Medications on File Prior to Visit  Medication Sig Dispense Refill   ALPRAZolam  (XANAX ) 0.5 MG tablet TAKE 1 TABLET BY MOUTH 2 TIMES DAILY AS NEEDED FOR ANXIETY 60 tablet 1   butalbital -acetaminophen -caffeine  (FIORICET) 50-325-40 MG tablet TAKE 1 TABLET BY MOUTH 2 TIMES DAILY AS NEEDED 60 tablet 1   calcium  elemental as carbonate (BARIATRIC TUMS ULTRA) 400 MG chewable tablet Chew 1 tablet by mouth 2 (two) times daily.     cetirizine (ZYRTEC) 10 MG tablet Take 10 mg by mouth daily.     Cholecalciferol (VITAMIN D3) 50 MCG (2000 UT) capsule Take 2,000 Units by mouth in the morning.     Cyanocobalamin (B-12 SL) Take 1,000 mcg by mouth in the morning.     diazepam  (VALIUM ) 5 MG tablet Take 1  tablet (5 mg total) by mouth daily as needed (for vertigo). (Patient not taking: Reported on 10/12/2023) 3 tablet 0   diclofenac  (VOLTAREN ) 75 MG EC tablet TAKE 1 TABLET BY MOUTH 2 TIMES DAILY 180 tablet 1   diltiazem  (CARDIZEM  CD) 360 MG 24 hr capsule Take 1 capsule (360 mg total) by mouth daily. 90 capsule 2   diltiazem  (CARDIZEM ) 30 MG tablet Take 1 tablet (30 mg total) by mouth as needed (for Afib or palpitations). 45 tablet 3   estradiol (VIVELLE-DOT) 0.1 MG/24HR patch APPLY 1 PATCH TWICE A WEEK 8 patch 12   ezetimibe  (ZETIA ) 10 MG tablet Take 1 tablet (10 mg total) by mouth daily. 90 tablet 2   flecainide  (TAMBOCOR ) 50 MG tablet Take 1 tablet (50 mg total) by mouth 2 (two) times daily. 180 tablet 2   hydroxypropyl methylcellulose / hypromellose (ISOPTO TEARS / GONIOVISC) 2.5 % ophthalmic solution Place 1 drop into both eyes daily as needed for dry eyes. (Patient not taking: Reported on 09/16/2023)     hydrOXYzine  (VISTARIL ) 25 MG capsule Take 1 capsule (25 mg total) by mouth at bedtime as needed. 90 capsule 2   levothyroxine  (SYNTHROID ) 50 MCG tablet Take 1 tablet (50 mcg total) by mouth daily before breakfast. 90 tablet 3   losartan  (COZAAR ) 100 MG tablet Take 1 tablet (100 mg total) by mouth daily. 90 tablet 3   meclizine  (ANTIVERT ) 12.5 MG tablet Take 1 tablet (12.5 mg total) by mouth 3 (three) times daily as needed for dizziness. 30 tablet 0   Misc Natural Products (ELDERBERRY ZINC/VIT C/IMMUNE MT) Use as directed 1 tablet in the mouth or throat as needed.     OVER THE COUNTER MEDICATION Take 1 capsule by mouth at bedtime. Delta 8     Rimegepant Sulfate (NURTEC) 75 MG TBDP Take one by mouth every other day for headache prevention 45 tablet 3   rosuvastatin  (CRESTOR ) 10 MG tablet Take 1 tablet (10 mg total) by mouth daily. 90 tablet 3   venlafaxine  XR (EFFEXOR -XR) 37.5 MG 24 hr capsule Take 2 capsules (75 mg total) by mouth daily with breakfast. 180 capsule 2   No current  facility-administered medications on file prior to visit.    Review of Systems:  As per HPI- otherwise negative.   Physical Examination: Vitals:   10/12/23 1031  BP: 136/84  Pulse: 71  SpO2: 98%   Vitals:   10/12/23 1031  Weight: 125 lb 3.2 oz (56.8 kg)  Height: 5' 2 (1.575 m)   Body mass index is 22.9 kg/m. Ideal Body Weight:  Weight in (lb) to have BMI = 25: 136.4  GEN: no acute distress. Normal weight, looks well  HEENT: Atraumatic, Normocephalic.  Ears and Nose: No external deformity. CV: RRR, No M/G/R. No JVD. No thrill. No extra heart sounds. PULM: CTA B, no wheezes, crackles, rhonchi. No retractions. No resp. distress. No accessory muscle use. ABD: S, NT, ND, +BS. No rebound. No HSM. EXTR: No c/c/e PSYCH: Normally interactive. Conversant.    Assessment and Plan: Benign paroxysmal positional vertigo, unspecified laterality  Frequent headaches - Plan: rizatriptan  (MAXALT ) 10 MG tablet  Immunization due  Essential hypertension Her cardiologist recently adjusted her losartan  dosage and her blood pressure does look better-she checks it regularly at home Assessment & Plan Benign paroxysmal positional vertigo Vertigo likely due to benign paroxysmal positional vertigo, improved with meclizine . Possible sinus infection contribution. - Continue meclizine  as needed. - Consider repositioning exercises if symptoms persist or worsen.  Chronic migraine without aura Chronic migraines improved with Nurtec but returned post-sinus infection. Increased Fioricet use. Plans to consult neurologist. - Continue Nurtec every other day. - Prescribe Maxalt  for acute relief.  I ran this by her cardiologist who did not have any concerns - Monitor headache frequency and response to Maxalt . - Follow up with neurologist.  She plans to get her flu shot at pharmacy after her COVID-vaccine   Signed Harlene Schroeder, MD

## 2023-10-12 ENCOUNTER — Ambulatory Visit (INDEPENDENT_AMBULATORY_CARE_PROVIDER_SITE_OTHER): Admitting: Family Medicine

## 2023-10-12 ENCOUNTER — Encounter: Payer: Self-pay | Admitting: Family Medicine

## 2023-10-12 VITALS — BP 136/84 | HR 71 | Ht 62.0 in | Wt 125.2 lb

## 2023-10-12 DIAGNOSIS — I10 Essential (primary) hypertension: Secondary | ICD-10-CM | POA: Diagnosis not present

## 2023-10-12 DIAGNOSIS — Z23 Encounter for immunization: Secondary | ICD-10-CM | POA: Diagnosis not present

## 2023-10-12 DIAGNOSIS — H8110 Benign paroxysmal vertigo, unspecified ear: Secondary | ICD-10-CM

## 2023-10-12 DIAGNOSIS — R519 Headache, unspecified: Secondary | ICD-10-CM

## 2023-10-12 MED ORDER — RIZATRIPTAN BENZOATE 10 MG PO TABS: 10.0000 mg | ORAL_TABLET | ORAL | 0 refills | Status: DC | PRN

## 2023-10-12 NOTE — Patient Instructions (Addendum)
 We will try adding maxalt  to see if we can break this headache cycle. Please let me know if this does not work for you!  BP look good today Recommend flu shot, covid booster, RSV if not done yet this fall  Please see me in about 6 months for recheck if all is well

## 2023-10-19 NOTE — Progress Notes (Unsigned)
 Cardiology Office Note   Date:  10/20/2023  ID:  Kristy Arias, DOB 1950/09/08, MRN [id number] PCP: Watt Harlene JAYSON, MD  [facility] Providers Cardiologist:  Newman JINNY Lawrence, MD Electrophysiologist:  OLE ONEIDA HOLTS, MD  APP: Orren Fabry, PA-C  History of Present Illness Kristy Arias is a 73 y.o. female with a past medical history of hypertension, hyperlipidemia, hypothyroidism, fibromuscular dysplasia, paroxysmal atrial fibrillation status post watchman implant (January 2024), ablation fall 2023 here for follow-up appointment.  Patient was having occasional palpitation symptoms which easily resolved with diltiazem  30 mg as needed, in addition to diltiazem  CD 360 mg daily.  Blood pressures had been recently uncontrolled.  Denies any chest pain, shortness of breath symptoms at her recent office visit 09/16/2023.  Some medication changes were made at her last office visit including an increase in losartan  with follow-up lab work.  Today, she presents with a hx of atrial fibrillation and hypertension for a follow-up visit.  She experiences atrial fibrillation episodes approximately once a month, monitored with a personal device. Her heart rate remains generally low during these episodes. She takes an additional 30 mg of diltiazem  as needed for palpitations, which alleviates her symptoms. An ablation procedure in fall 2023 and a Watchman device placement in January 2024 have contributed to heart rate control.  Blood pressure readings have been variable, occasionally reaching 150s to 160s, but have shown a downward trend recently. Blood pressure is better earlier in the day and is influenced by the 'white coat effect'. Her losartan  dosage was increased to 100 mg four weeks ago, and she takes diltiazem  360 mg daily. She attempts to keep a blood pressure record but lacks consistency.  Her cholesterol medication, Crestor , was increased to 10 mg daily without side effects. She has a  history of fibromuscular dysplasia and tortuous internal carotid artery, identified in a carotid ultrasound in 2022, but does not recall specific symptoms related to this condition. She is on Synthroid  50 mcg for thyroid  management with stable TSH levels  Reports no shortness of breath nor dyspnea on exertion. Reports no chest pain, pressure, or tightness. No edema, orthopnea, PND.   Discussed the use of AI scribe software for clinical note transcription with the patient, who gave verbal consent to proceed.  ROS: Pertinent ROS in HPI  Studies Reviewed      Carotid artery duplex 08/07/2020: Duplex suggests stenosis in the right internal carotid artery (1-15%). Duplex suggests stenosis in the left internal carotid artery (1-15%). There is very mild heterogenous plaque in bilateral carotid arteries. Antegrade right vertebral artery flow. Antegrade left vertebral artery flow   Risk Assessment/Calculations  CHA2DS2-VASc Score = 3  This indicates a 3.2% annual risk of stroke. The patient's score is based upon: CHF History: 0 HTN History: 1 Diabetes History: 0 Stroke History: 0 Vascular Disease History: 0 Age Score: 1 Gender Score: 1   Physical Exam VS:  BP 130/62   Pulse 62   Ht 5' 2 (1.575 m)   Wt 125 lb (56.7 kg)   SpO2 97%   BMI 22.86 kg/m        Wt Readings from Last 3 Encounters:  10/20/23 125 lb (56.7 kg)  10/12/23 125 lb 3.2 oz (56.8 kg)  09/16/23 124 lb 9.6 oz (56.5 kg)    GEN: Well nourished, well developed in no acute distress NECK: No JVD; No carotid bruits CARDIAC: RRR, no murmurs, rubs, gallops RESPIRATORY:  Clear to auscultation without rales, wheezing or rhonchi  ABDOMEN: Soft, non-tender, non-distended EXTREMITIES:  No edema; No deformity   ASSESSMENT AND PLAN  Paroxysmal atrial fibrillation status post ablation and Watchman device AFib well-managed with diltiazem . Ablation and Watchman device improved condition. Occasional episodes occur, but heart  rate remains controlled. Personal kardia device aids monitoring. - Continue diltiazem  for rate control. - Use personal Kardia device to monitor AFib episodes. - Take additional 30 mg of diltiazem  as needed for palpitations. - No need for anticoagulation due to Watchman device  Essential hypertension Hypertension managed with losartan  100 mg, showing improved readings. Occasional spikes noted, possibly due to white coat effect. Diltiazem  aids in control. Consider stronger ARB if needed. - Continue losartan  100 mg daily. - Monitor blood pressure regularly and maintain a log. - Consider switching to a stronger ARB if blood pressure remains uncontrolled.  Mixed hyperlipidemia Managed with Crestor  10 mg daily. Alzheimer's risk concerns addressed. No side effects. Lipid panel planned for October to assess dose effectiveness. - Continue Crestor  10 mg daily. - Order lipid panel at the end of October to assess cholesterol levels.  Fibromuscular dysplasia and tortuous internal carotid artery Carotid ultrasound showed fibromuscular dysplasia and tortuous artery, likely genetic. Asymptomatic, no intervention needed unless symptoms develop. - Monitor for symptoms such as neck pain, dizziness, or stroke-like symptoms. - No immediate intervention required.   Dispo: She can follow-up in 1 year with Dr. [place]  Signed, Orren LOISE Fabry, PA-C

## 2023-10-20 ENCOUNTER — Ambulatory Visit: Attending: Physician Assistant | Admitting: Physician Assistant

## 2023-10-20 ENCOUNTER — Encounter: Payer: Self-pay | Admitting: Physician Assistant

## 2023-10-20 VITALS — BP 130/62 | HR 62 | Ht 62.0 in | Wt 125.0 lb

## 2023-10-20 DIAGNOSIS — I10 Essential (primary) hypertension: Secondary | ICD-10-CM

## 2023-10-20 DIAGNOSIS — I480 Paroxysmal atrial fibrillation: Secondary | ICD-10-CM | POA: Diagnosis not present

## 2023-10-20 DIAGNOSIS — E782 Mixed hyperlipidemia: Secondary | ICD-10-CM | POA: Diagnosis not present

## 2023-10-20 DIAGNOSIS — Z95818 Presence of other cardiac implants and grafts: Secondary | ICD-10-CM | POA: Diagnosis not present

## 2023-10-20 NOTE — Patient Instructions (Addendum)
 Medication Instructions:  Your physician recommends that you continue on your current medications as directed. Please refer to the Current Medication list given to you today.  *If you need a refill on your cardiac medications before your next appointment, please call your pharmacy*  Lab Work: Lipids, LFTs in one month If you have labs (blood work) drawn today and your tests are completely normal, you will receive your results only by: MyChart Message (if you have MyChart) OR A paper copy in the mail If you have any lab test that is abnormal or we need to change your treatment, we will call you to review the results.    Your next appointment:   1 year(s)  Provider:   Newman JINNY Lawrence, MD    We recommend signing up for the patient portal called MyChart.  Sign up information is provided on this After Visit Summary.  MyChart is used to connect with patients for Virtual Visits (Telemedicine).  Patients are able to view lab/test results, encounter notes, upcoming appointments, etc.  Non-urgent messages can be sent to your provider as well.   To learn more about what you can do with MyChart, go to [email or web address].   Other Instructions Please monitor your blood pressure daily

## 2023-10-21 ENCOUNTER — Encounter: Payer: Self-pay | Admitting: Physician Assistant

## 2023-10-21 ENCOUNTER — Ambulatory Visit (INDEPENDENT_AMBULATORY_CARE_PROVIDER_SITE_OTHER): Admitting: Physician Assistant

## 2023-10-21 VITALS — BP 147/70 | HR 78 | Resp 20 | Wt 125.0 lb

## 2023-10-21 DIAGNOSIS — Z711 Person with feared health complaint in whom no diagnosis is made: Secondary | ICD-10-CM | POA: Diagnosis not present

## 2023-10-21 NOTE — Patient Instructions (Signed)
 It was a pleasure to see you today at our office.   Recommendations:  Neurocognitive evaluation at our office  Follow up pending on the results of the neurocogitive testing   RECOMMENDATIONS FOR ALL PATIENTS WITH MEMORY PROBLEMS: 1. Continue to exercise (Recommend 30 minutes of walking everyday, or 3 hours every week) 2. Increase social interactions - continue going to [place] Ari[place] and enjoy social gatherings with friends and family 3. Eat healthy, avoid fried foods and eat more fruits and vegetables 4. Maintain adequate blood pressure, blood sugar, and blood cholesterol level. Reducing the risk of stroke and cardiovascular disease also helps promoting better memory. 5. Avoid stressful situations. Live a simple life and avoid aggravations. Organize your time and prepare for the next day in anticipation. 6. Sleep well, avoid any interruptions of sleep and avoid any distractions in the bedroom that may interfere with adequate sleep quality 7. Avoid sugar, avoid sweets as there is a strong link between excessive sugar intake, diabetes, and cognitive impairment We discussed the Mediterranean diet, which has been shown to help patients reduce the risk of progressive memory disorders and reduces cardiovascular risk. This includes eating fish, eat fruits and green leafy vegetables, nuts like almonds and hazelnuts, walnuts, and also use olive oil. Avoid fast foods and fried foods as much as possible. Avoid sweets and sugar as sugar use has been linked to worsening of memory function.  There is always a concern of gradual progression of memory problems. If this is the case, then we may need to adjust level of care according to patient needs. Support, both to the patient and caregiver, should then be put into place.      You have been referred for a neuropsychological evaluation (i.e., evaluation of memory and thinking abilities). Please bring someone with you to this appointment if possible, as it is helpful  for the doctor to hear from both you and another adult who knows you well. Please bring eyeglasses and hearing aids if you wear them.    The evaluation will take approximately 3 hours and has two parts:   The first part is a clinical interview with the neuropsychologist (Dr. Richie or Dr. Jackquline). During the interview, the neuropsychologist will speak with you and the individual you brought to the appointment.    The second part of the evaluation is testing with the doctor's technician Neal or Luke). During the testing, the technician will ask you to remember different types of material, solve problems, and answer some questionnaires. Your family member will not be present for this portion of the evaluation.   Please note: We must reserve several hours of the neuropsychologist's time and the psychometrician's time for your evaluation appointment. As such, there is a No-Show fee of $100. If you are unable to attend any of your appointments, please contact our office as soon as possible to reschedule.    FALL PRECAUTIONS: Be cautious when walking. Scan the area for obstacles that may increase the risk of trips and falls. When getting up in the mornings, sit up at the edge of the bed for a few minutes before getting out of bed. Consider elevating the bed at the head end to avoid drop of blood pressure when getting up. Walk always in a well-lit room (use night lights in the walls). Avoid area rugs or power cords from appliances in the middle of the walkways. Use a walker or a cane if necessary and consider physical therapy for balance exercise. Get your eyesight checked  regularly.  FINANCIAL OVERSIGHT: Supervision, especially oversight when making financial decisions or transactions is also recommended.  HOME SAFETY: Consider the safety of the kitchen when operating appliances like stoves, microwave oven, and blender. Consider having supervision and share cooking responsibilities until no longer able to  participate in those. Accidents with firearms and other hazards in the house should be identified and addressed as well.   ABILITY TO BE LEFT ALONE: If patient is unable to contact 911 operator, consider using LifeLine, or when the need is there, arrange for someone to stay with patients. Smoking is a fire hazard, consider supervision or cessation. Risk of wandering should be assessed by caregiver and if detected at any point, supervision and safe proof recommendations should be instituted.  MEDICATION SUPERVISION: Inability to self-administer medication needs to be constantly addressed. Implement a mechanism to ensure safe administration of the medications.   DRIVING: Regarding driving, in patients with progressive memory problems, driving will be impaired. We advise to have someone else do the driving if trouble finding directions or if minor accidents are reported. Independent driving assessment is available to determine safety of driving.   If you are interested in the driving assessment, you can contact the following:  [employer] in [place] [phone]  [facility] [phone]  [facility] [phone] or [phone]    Mediterranean Diet A Mediterranean diet refers to food and lifestyle choices that are based on the traditions of countries located on the [employer]. This way of eating has been shown to help prevent certain conditions and improve outcomes for people who have chronic diseases, like kidney disease and heart disease. What are tips for following this plan? Lifestyle  Cook and eat meals together with your family, when possible. Drink enough fluid to keep your urine clear or pale yellow. Be physically active every day. This includes: Aerobic exercise like running or swimming. Leisure activities like gardening, walking, or housework. Get 7-8 hours of sleep each night. If recommended  by your health care provider, drink red wine in moderation. This means 1 glass a day for nonpregnant women and 2 glasses a day for men. A glass of wine equals 5 oz (150 mL). Reading food labels  Check the serving size of packaged foods. For foods such as rice and pasta, the serving size refers to the amount of cooked product, not dry. Check the total fat in packaged foods. Avoid foods that have saturated fat or trans fats. Check the ingredients list for added sugars, such as corn syrup. Shopping  At the grocery store, buy most of your food from the areas near the walls of the store. This includes: Fresh fruits and vegetables (produce). Grains, beans, nuts, and seeds. Some of these may be available in unpackaged forms or large amounts (in bulk). Fresh seafood. Poultry and eggs. Low-fat dairy products. Buy whole ingredients instead of prepackaged foods. Buy fresh fruits and vegetables in-season from local farmers markets. Buy frozen fruits and vegetables in resealable bags. If you do not have access to quality fresh seafood, buy precooked frozen shrimp or canned fish, such as tuna, salmon, or sardines. Buy small amounts of raw or cooked vegetables, salads, or olives from the deli or salad bar at your store. Stock your pantry so you always have certain foods on hand, such as olive oil, canned tuna, canned tomatoes, rice, pasta, and beans. Cooking  Cook foods with extra-virgin olive oil instead of using butter or other vegetable oils.  Have meat as a side dish, and have vegetables or grains as your main dish. This means having meat in small portions or adding small amounts of meat to foods like pasta or stew. Use beans or vegetables instead of meat in common dishes like chili or lasagna. Experiment with different cooking methods. Try roasting or broiling vegetables instead of steaming or sauteing them. Add frozen vegetables to soups, stews, pasta, or rice. Add nuts or seeds for added healthy fat  at each meal. You can add these to yogurt, salads, or vegetable dishes. Marinate fish or vegetables using olive oil, lemon juice, garlic, and fresh herbs. Meal planning  Plan to eat 1 vegetarian meal one day each week. Try to work up to 2 vegetarian meals, if possible. Eat seafood 2 or more times a week. Have healthy snacks readily available, such as: Vegetable sticks with hummus. Greek yogurt. Fruit and nut trail mix. Eat balanced meals throughout the week. This includes: Fruit: 2-3 servings a day Vegetables: 4-5 servings a day Low-fat dairy: 2 servings a day Fish, poultry, or lean meat: 1 serving a day Beans and legumes: 2 or more servings a week Nuts and seeds: 1-2 servings a day Whole grains: 6-8 servings a day Extra-virgin olive oil: 3-4 servings a day Limit red meat and sweets to only a few servings a month What are my food choices? Mediterranean diet Recommended Grains: Whole-grain pasta. Brown rice. Bulgar wheat. Polenta. Couscous. Whole-wheat bread. [place] Madeira. Vegetables: Artichokes. Beets. Broccoli. Cabbage. Carrots. Eggplant. Green beans. Chard. Kale. Spinach. Onions. Leeks. Peas. Squash. Tomatoes. Peppers. Radishes. Fruits: Apples. Apricots. Avocado. Berries. Bananas. Cherries. Dates. Figs. Grapes. Lemons. Melon. Oranges. Peaches. Plums. Pomegranate. Meats and other protein foods: Beans. Almonds. Sunflower seeds. Pine nuts. Peanuts. Cod. Salmon. Scallops. Shrimp. Tuna. Tilapia. Clams. Oysters. Eggs. Dairy: Low-fat milk. Cheese. Greek yogurt. Beverages: Water. Red wine. Herbal tea. Fats and oils: Extra virgin olive oil. Avocado oil. Grape seed oil. Sweets and desserts: [place] yogurt with honey. Baked apples. Poached pears. Trail mix. Seasoning and other foods: Basil. Cilantro. Coriander. Cumin. Mint. Parsley. Sage. Rosemary. Tarragon. Garlic. Oregano. Thyme. Pepper. Balsalmic vinegar. Tahini. Hummus. Tomato sauce. Olives. Mushrooms. Limit these Grains: Prepackaged  pasta or rice dishes. Prepackaged cereal with added sugar. Vegetables: Deep fried potatoes (french fries). Fruits: Fruit canned in syrup. Meats and other protein foods: Beef. Pork. Lamb. Poultry with skin. Hot dogs. [place]. Dairy: Ice cream. Sour cream. Whole milk. Beverages: Juice. Sugar-sweetened soft drinks. Beer. Liquor and spirits. Fats and oils: Butter. Canola oil. Vegetable oil. Beef fat (tallow). Lard. Sweets and desserts: Cookies. Cakes. Pies. Candy. Seasoning and other foods: Mayonnaise. Premade sauces and marinades. The items listed may not be a complete list. Talk with your dietitian about what dietary choices are right for you. Summary The Mediterranean diet includes both food and lifestyle choices. Eat a variety of fresh fruits and vegetables, beans, nuts, seeds, and whole grains. Limit the amount of red meat and sweets that you eat. Talk with your health care provider about whether it is safe for you to drink red wine in moderation. This means 1 glass a day for nonpregnant women and 2 glasses a day for men. A glass of wine equals 5 oz (150 mL). This information is not intended to replace advice given to you by your health care provider. Make sure you discuss any questions you have with your health care provider. Document Released: 09/04/2015 Document Revised: 10/07/2015 Document Reviewed: 09/04/2015 Elsevier Interactive Patient Education  2017 [employer].  We have sent a referral to [facility] for your MRI and they will call you directly to schedule your appointment. They are located at [address] [facility]. If you need to contact them directly please call [phone].   Your provider has requested that you have labwork completed today. Please go to [facility] ([id number]) on the second floor of this building before leaving the office today. You do not need to check in. If you are not called within 15 minutes please check with the front desk.

## 2023-10-21 NOTE — Progress Notes (Signed)
 Assessment/Plan:    Memory concerns  Kristy Arias is a very pleasant 73 y.o. 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  She has not been seen since April 2023, at the time, she did not show evidence of memory disorder and imaging was normal She is not on antidementia medications. In today's visit, the patient is concerned about attention, suspects that she may have ADD. She has minor issues with short term memory, although her MMSE today is 30/30. Patient is able to participate on ADLs and to drive without difficulties .She reports anxious mood.    Recommendations:   Follow up pending on the results of the neuropsych evaluation. If neurodegenerative process is suspected, will proceed with further workup and consider antidementia medication. At this time, this is not indicated. If attention is suspected, recommend psychiatry for further evaluation and treatment as this office does not treat ADD. Neuropsych evaluation to determine other causes of memory loss including anxiety, depression, sleep, attention, among others. Recommend good control of cardiovascular risk factors Continue to control mood as per PCP    Subjective:   This patient is here alone. Previous records as well as any outside records available were reviewed prior to todays visit.   Patient was last seen on 05/06/2021.  Her last MoCA on 04/02/2021 was 27/30.    Any changes in memory since last visit?  Not quite as good as it was before. STM is worse than LTM. At [facility] sitting on a sermon, my brain goes off in different direction and lose attention. Always been bad with names, but is able to remember conversations.  repeats oneself?  Denies  Disoriented when walking into a room?  Patient denies    Misplacing objects?  Patient  denies, however, as before, she forgets her wallet or phone at home when she goes out.  Wandering behavior?   Denies. Any personality changes since last visit? Denies.  She has a history of anxiety, she is very concerned about her memory as her mother died with Alzheimer's dementia which causes significant stress.  Any worsening depression?: denies.  She takes Effexor . Hallucinations or paranoia?  Denies.   Seizures?   Denies.    Any sleep changes? Does not sleep very well it is always a struggle. Takes Vystaril and Xanax  as needed, alternates it  with Delta 8 . Denies vivid dreams,nightmares,  REM behavior or sleepwalking   Sleep apnea?   denies     Any hygiene concerns?   Denies.   Independent of bathing and dressing?  Endorsed  Does the patient needs help with medications? Patient is in charge of the meds are in a pillbox. Occasionally may miss a dose Who is in charge of the finances?  Husband is in charge, as she says math is harder with age.     Any changes in appetite?  denies, denies any kitchen accidents    Patient have trouble swallowing?  Denies.   Does the patient cook? yes  Any kitchen accidents such as leaving the stove on?   Denies.   Any headaches?   She has a history of headaches, treated with Tylenol , if severe she may take Maxalt  (recently started). Takes Nurtec every other day preventively. Vision changes? Denies. She has a history of glaucoma Chronic pain?  Denies.   Ambulates with difficulty?  Denies. Tries to stay active  Recent falls or head injuries? Denies.   One fall yard waste can and went down the driveway, no LOC, no head injury   Unilateral weakness, numbness or tingling?  Denies.   Any tremors?  Denies.   Any anosmia?    Denies.   Any incontinence of urine?  Denies.   Any bowel dysfunction?  Denies.      Patient lives with her husband .  Does the patient drive? Yes, denies getting lost, uses GPS   MRI of the brain, personally reviewed, negative for  acute findings.  Normal for age.  Initial visit 04/02/21 The patient is seen in neurologic consultation at the request of Copland, Harlene BROCKS, MD for the evaluation of memory.  The patient is here alone. This is a 73 y.o. year old RH  female who has had memory issues for about 7 years.  She was initially seen here in Nov 15, 2014, with negative work-up.  She was also seen at [facility] in 2017-11-14, with same concerns, but again work-up was negative, no medications were indicated.  She reports that since her mother died in 2018/11/15, she is concerned that she may develop dementia I am paranoid about having a diagnosis because of my mom . She reports that her father died in 11-15-23 of last year, and that has had an effect on her depression.  Her best friend has been diagnosed with Alzheimer's disease as well at age 18 which brought more concerns.  She states that at times she repeats herself, she is very bad with names, and this is worse lately, my brain is in overload, and bouncing all over the place, I am off on another tangent .  She is concerned about ADHD although she has never been diagnosed with it formally.  I think of tasks to need to do, and do something else instead.  No recent infections, but she did have COVID in October 2022, with COVID fog for 2 months, I could not remember the names of the books I read but now I am back to normal . She lives with her husband, who does not note any changes.  Her mood is stable, but she did have moments of depression, with changes in her medications from Cymbalta  (which was causing insomnia), now on Effexor  and she can sleep well, without REM behavior, sleepwalking, hallucinations or paranoia.  She has been retired since 11-15-2015, but remains active, she likes to walk, goes to the gym frequently with her friends.  She continues to drive, denies getting lost.  She denies being disoriented when walking into her room.  She denies leaving objects in unusual places, but frequently she forgets  the wallet or the phone in the home after leaving the house.  She is independent of bathing and dressing.  Her medications are in a pillbox, and denies missing any.  Her husband is in charge of the finances, as mouth is harder with age .  Her appetite is normal, denies trouble swallowing.  She cooks, has remodeled her kitchen, and learning to use a new opening, accidentally leaving it on.  She has a history of migraine headaches, starting in her teenage years, with a frequency of 1 or 2 a week, 2 times a month, takes Imitrex for severe headaches, otherwise controlled with Tylenol .  She denies double vision, dizziness, focal numbness or tingling, unilateral weakness, tremors or anosmia.  No history of seizures.  Denies urine incontinence, retention, constipation or diarrhea, OSA, alcohol or tobacco abuse.  As mentioned  above, mother died with Alzheimer's disease.  She is a retired Teacher, early years/pre.       Past Medical History:  Diagnosis Date   Anemia    Anxiety    Arthritis    Depression    GERD (gastroesophageal reflux disease)    Glaucoma    Headache    Hyperlipidemia    Hypertension    Hypothyroidism    Presence of Watchman left atrial appendage closure device 02/04/2022   s/p LAAO with a 27 mm Watchman FLX by Dr Cindie   Syndrome OLEGARIO, cardiac      Past Surgical History:  Procedure Laterality Date   ABDOMINAL HYSTERECTOMY     ATRIAL FIBRILLATION ABLATION N/A 10/23/2021   Procedure: ATRIAL FIBRILLATION ABLATION;  Surgeon: Cindie Ole DASEN, MD;  Location: [facility] INVASIVE CV LAB;  Service: Cardiovascular;  Laterality: N/A;   BREAST ENHANCEMENT SURGERY     BREAST SURGERY     CARDIAC CATHETERIZATION  4/14   CHOLECYSTECTOMY     COSMETIC SURGERY     DILATION AND CURETTAGE OF UTERUS  2013   EYE SURGERY     LAPAROSCOPIC CHOLECYSTECTOMY SINGLE PORT N/A 06/29/2012   Procedure: LAPAROSCOPIC CHOLECYSTECTOMY SINGLE PORT IOC ;  Surgeon: Elspeth KYM Schultze, MD;  Location: [facility] ORS;  Service: General;   Laterality: N/A;   LEFT ATRIAL APPENDAGE OCCLUSION N/A 02/04/2022   Procedure: LEFT ATRIAL APPENDAGE OCCLUSION;  Surgeon: Cindie Ole DASEN, MD;  Location: [facility] INVASIVE CV LAB;  Service: Cardiovascular;  Laterality: N/A;   LEFT HEART CATHETERIZATION WITH CORONARY ANGIOGRAM N/A 05/23/2012   Procedure: LEFT HEART CATHETERIZATION WITH CORONARY ANGIOGRAM;  Surgeon: Erick JONELLE Bergamo, MD;  Location: [facility] CATH LAB;  Service: Cardiovascular;  Laterality: N/A;   TEE WITHOUT CARDIOVERSION N/A 02/04/2022   Procedure: TRANSESOPHAGEAL ECHOCARDIOGRAM (TEE);  Surgeon: Cindie Ole DASEN, MD;  Location: [facility] INVASIVE CV LAB;  Service: Cardiovascular;  Laterality: N/A;   TOTAL KNEE ARTHROPLASTY Right 08/25/2015   Procedure: RIGHT TOTAL KNEE ARTHROPLASTY;  Surgeon: Dempsey Moan, MD;  Location: [facility] ORS;  Service: Orthopedics;  Laterality: Right;   TOTAL KNEE ARTHROPLASTY Left 08/09/2016   Procedure: LEFT TOTAL KNEE ARTHROPLASTY;  Surgeon: Moan Dempsey, MD;  Location: [facility] ORS;  Service: Orthopedics;  Laterality: Left;     PREVIOUS MEDICATIONS:   CURRENT MEDICATIONS:  Outpatient Encounter Medications as of 10/21/2023  Medication Sig   ALPRAZolam  (XANAX ) 0.5 MG tablet TAKE 1 TABLET BY MOUTH 2 TIMES DAILY AS NEEDED FOR ANXIETY   butalbital -acetaminophen -caffeine  (FIORICET) 50-325-40 MG tablet TAKE 1 TABLET BY MOUTH 2 TIMES DAILY AS NEEDED   calcium  elemental as carbonate (BARIATRIC TUMS ULTRA) 400 MG chewable tablet Chew 1 tablet by mouth 2 (two) times daily.   cetirizine (ZYRTEC) 10 MG tablet Take 10 mg by mouth daily.   Cholecalciferol (VITAMIN D3) 50 MCG (2000 UT) capsule Take 2,000 Units by mouth in the morning.   Cyanocobalamin (B-12 SL) Take 1,000 mcg by mouth in the morning.   diazepam  (VALIUM ) 5 MG tablet Take 1 tablet (5 mg total) by mouth daily as needed (for vertigo).   diclofenac  (VOLTAREN ) 75 MG EC tablet TAKE 1 TABLET BY MOUTH 2 TIMES DAILY   diltiazem  (CARDIZEM  CD) 360 MG 24 hr capsule Take 1 capsule (360  mg total) by mouth daily.   diltiazem  (CARDIZEM ) 30 MG tablet Take 1 tablet (30 mg total) by mouth as needed (for Afib or palpitations).   estradiol (VIVELLE-DOT) 0.1 MG/24HR patch APPLY 1 PATCH TWICE A WEEK   ezetimibe  (ZETIA ) 10 MG  tablet Take 1 tablet (10 mg total) by mouth daily.   flecainide  (TAMBOCOR ) 50 MG tablet Take 1 tablet (50 mg total) by mouth 2 (two) times daily.   hydroxypropyl methylcellulose / hypromellose (ISOPTO TEARS / GONIOVISC) 2.5 % ophthalmic solution Place 1 drop into both eyes daily as needed for dry eyes.   hydrOXYzine  (VISTARIL ) 25 MG capsule Take 1 capsule (25 mg total) by mouth at bedtime as needed.   levothyroxine  (SYNTHROID ) 50 MCG tablet Take 1 tablet (50 mcg total) by mouth daily before breakfast.   losartan  (COZAAR ) 100 MG tablet Take 1 tablet (100 mg total) by mouth daily.   meclizine  (ANTIVERT ) 12.5 MG tablet Take 1 tablet (12.5 mg total) by mouth 3 (three) times daily as needed for dizziness.   Misc Natural Products (ELDERBERRY ZINC/VIT C/IMMUNE MT) Use as directed 1 tablet in the mouth or throat as needed.   OVER THE COUNTER MEDICATION Take 1 capsule by mouth at bedtime. Delta 8   Rimegepant Sulfate (NURTEC) 75 MG TBDP Take one by mouth every other day for headache prevention   rizatriptan  (MAXALT ) 10 MG tablet Take 1 tablet (10 mg total) by mouth as needed for migraine. May repeat in 2 hours if needed.  Max 20 mg daily   rosuvastatin  (CRESTOR ) 10 MG tablet Take 1 tablet (10 mg total) by mouth daily.   venlafaxine  XR (EFFEXOR -XR) 37.5 MG 24 hr capsule Take 2 capsules (75 mg total) by mouth daily with breakfast.   No facility-administered encounter medications on file as of 10/21/2023.     Objective:     PHYSICAL EXAMINATION:    VITALS:   Vitals:   10/21/23 1528  BP: (!) 147/70  Pulse: 78  Resp: 20  SpO2: 99%  Weight: 125 lb (56.7 kg)    GEN:  The patient appears stated age and is in NAD. HEENT:  Normocephalic, atraumatic.   Neurological  examination:  General: NAD, well-groomed, appears stated age. Orientation: The patient is alert. Oriented to person, place and date.  Cranial nerves: There is good facial symmetry.The speech is fluent and clear. No aphasia or dysarthria. Fund of knowledge is appropriate. Recent  and remote memory is normal.  Attention and concentration are normal during testing.  Able to name objects and repeat phrases.  Hearing is intact to conversational tone. Delayed recall 3/3  Sensation: Sensation is intact to light touch throughout Motor: Strength is at least antigravity x4. DTR's 2/4 in UE/LE      04/02/2021   10:00 AM 05/31/2014    8:34 AM  Montreal Cognitive Assessment   Visuospatial/ Executive (0/5) 4 5  Naming (0/3) 3 3  Attention: Read list of digits (0/2) 2 2  Attention: Read list of letters (0/1) 1 1  Attention: Serial 7 subtraction starting at 100 (0/3) 3 3  Language: Repeat phrase (0/2) 1 2  Language : Fluency (0/1) 1 1  Abstraction (0/2) 2 2  Delayed Recall (0/5) 5 5  Orientation (0/6) 5 6  Total 27 30  Adjusted Score (based on education) 27 30       10/21/2023    4:00 PM  MMSE - Mini Mental State Exam  Orientation to time 5  Orientation to Place 5  Registration 3  Attention/ Calculation 5  Recall 3  Language- name 2 objects 2  Language- repeat 1  Language- follow 3 step command 3  Language- read & follow direction 1  Write a sentence 1  Copy design 1  Total score 30  Movement examination: Tone: There is normal tone in the UE/LE Abnormal movements:  no tremor.  No myoclonus.  No asterixis.   Coordination:  There is no decremation with RAM's. Normal finger to nose  Gait and Station: The patient has no difficulty arising out of a deep-seated chair without the use of the hands. The patient's stride length is good.  Gait is cautious and narrow.   Thank you for allowing us  the opportunity to participate in the care of this nice patient. Please do not hesitate to contact  us  for any questions or concerns.   Total time spent on today's visit was 30 minutes dedicated to this patient today, preparing to see patient, examining the patient, ordering tests and/or medications and counseling the patient, documenting clinical information in the EHR or other health record, independently interpreting results and communicating results to the patient/family, discussing treatment and goals, answering patient's questions and coordinating care.  Cc:  Copland, Harlene BROCKS, MD  Camie Sevin 10/21/2023 4:56 PM

## 2023-10-25 ENCOUNTER — Other Ambulatory Visit: Payer: Self-pay | Admitting: Family Medicine

## 2023-10-25 DIAGNOSIS — R519 Headache, unspecified: Secondary | ICD-10-CM

## 2023-11-04 ENCOUNTER — Other Ambulatory Visit: Payer: Self-pay | Admitting: Family Medicine

## 2023-11-04 DIAGNOSIS — F341 Dysthymic disorder: Secondary | ICD-10-CM

## 2023-11-04 DIAGNOSIS — M1612 Unilateral primary osteoarthritis, left hip: Secondary | ICD-10-CM

## 2023-11-04 DIAGNOSIS — E039 Hypothyroidism, unspecified: Secondary | ICD-10-CM

## 2023-11-15 LAB — HEPATIC FUNCTION PANEL
ALT: 10 IU/L (ref 0–32)
AST: 16 IU/L (ref 0–40)
Albumin: 4.3 g/dL (ref 3.8–4.8)
Alkaline Phosphatase: 74 IU/L (ref 49–135)
Bilirubin Total: 0.3 mg/dL (ref 0.0–1.2)
Bilirubin, Direct: 0.1 mg/dL (ref 0.00–0.40)
Total Protein: 6.4 g/dL (ref 6.0–8.5)

## 2023-11-15 LAB — LIPID PANEL
Chol/HDL Ratio: 2.6 ratio (ref 0.0–4.4)
Cholesterol, Total: 172 mg/dL (ref 100–199)
HDL: 65 mg/dL (ref >39)
LDL Chol Calc (NIH): 80 mg/dL (ref 0–99)
Triglycerides: 157 mg/dL — ABNORMAL HIGH (ref 0–149)
VLDL Cholesterol Cal: 27 mg/dL (ref 5–40)

## 2023-11-16 ENCOUNTER — Ambulatory Visit: Payer: Self-pay | Admitting: Physician Assistant

## 2023-11-16 NOTE — Telephone Encounter (Signed)
 Spoke with pt regarding lab results. Pt verbalized understanding. Pt was advised to follow mediterranean diet per Orren Fabry. Pt stated she will not be making any dietary changes. Pt was advised to call our office if she has any further questions.

## 2023-12-02 ENCOUNTER — Other Ambulatory Visit: Payer: Self-pay | Admitting: Family Medicine

## 2023-12-02 DIAGNOSIS — F5101 Primary insomnia: Secondary | ICD-10-CM

## 2023-12-07 ENCOUNTER — Telehealth: Payer: Self-pay

## 2023-12-07 NOTE — Telephone Encounter (Signed)
 Called to check in with patient, who had LAAO on 02/04/2022. The patient reports doing well with no issues.  The patient understands to call with questions or concerns.

## 2023-12-13 ENCOUNTER — Encounter: Payer: Self-pay | Admitting: Physician Assistant

## 2023-12-14 ENCOUNTER — Ambulatory Visit (INDEPENDENT_AMBULATORY_CARE_PROVIDER_SITE_OTHER): Admitting: Psychology

## 2023-12-14 ENCOUNTER — Ambulatory Visit: Payer: Self-pay

## 2023-12-14 DIAGNOSIS — R41840 Attention and concentration deficit: Secondary | ICD-10-CM

## 2023-12-14 NOTE — Progress Notes (Signed)
   NEUROPSYCHOLOGY NOTE [place]. [facility]  [place] Department of Neurology  Date of Feedback Session: 12/14/2023  REASON FOR REFERRAL   Patient presented requesting an evaluation for attention difficulties with the intention of pursuing treatment, specifically medication. It was explained that attention evaluations and related medication management are outside the scope of services provided at this practice. She was informed that she could still undergo a comprehensive cognitive evaluation if she wished. However, it was emphasized that no comment could be made regarding ADHD or medication. After further discussion, she decided to seek evaluation elsewhere. We discussed resources to help identify an appropriate provider for an attention evaluation, and she was reminded that she could return to this practice at any time for cognitive testing.     No billing was submitted for this encounter.            Renda Beckwith, Psy.D.             Neuropsychologist

## 2023-12-29 ENCOUNTER — Encounter: Admitting: Psychology

## 2024-01-11 ENCOUNTER — Other Ambulatory Visit: Payer: Self-pay | Admitting: Family Medicine

## 2024-01-11 DIAGNOSIS — Z760 Encounter for issue of repeat prescription: Secondary | ICD-10-CM

## 2024-01-31 NOTE — Progress Notes (Signed)
 " Cardiology Office Note   Date:  02/02/2024  ID:  [name], DOB 09-Apr-1950, MRN [id number] PCP: Watt Harlene JAYSON, MD  [facility] Providers Cardiologist:  Newman JINNY Lawrence, MD Electrophysiologist:  OLE ONEIDA HOLTS, MD (Inactive)  APP: Orren Fabry, PA-C  History of Present Illness Kristy Arias is a 74 y.o. female with a past medical history of hypertension, hyperlipidemia, hypothyroidism, fibromuscular dysplasia, paroxysmal atrial fibrillation status post watchman implant (January 2024), ablation fall 2023 here for follow-up appointment.  Patient was having occasional palpitation symptoms which easily resolved with diltiazem  30 mg as needed, in addition to diltiazem  CD 360 mg daily.  Blood pressures had been recently uncontrolled.  Denies any chest pain, shortness of breath symptoms at her recent office visit 09/16/2023.  Some medication changes were made at her last office visit including an increase in losartan  with follow-up lab work.  I saw her last 10/20/2023, she presents with a hx of atrial fibrillation and hypertension for a follow-up visit.  She experiences atrial fibrillation episodes approximately once a month, monitored with a personal device. Her heart rate remains generally low during these episodes. She takes an additional 30 mg of diltiazem  as needed for palpitations, which alleviates her symptoms. An ablation procedure in fall 2023 and a Watchman device placement in January 2024 have contributed to heart rate control.  Blood pressure readings have been variable, occasionally reaching 150s to 160s, but have shown a downward trend recently. Blood pressure is better earlier in the day and is influenced by the 'white coat effect'. Her losartan  dosage was increased to 100 mg four weeks ago, and she takes diltiazem  360 mg daily. She attempts to keep a blood pressure record but lacks consistency.  Her cholesterol medication, Crestor , was increased to 10 mg daily  without side effects. She has a history of fibromuscular dysplasia and tortuous internal carotid artery, identified in a carotid ultrasound in 2022, but does not recall specific symptoms related to this condition. She is on Synthroid  50 mcg for thyroid  management with stable TSH levels  Reports no shortness of breath nor dyspnea on exertion. Reports no chest pain, pressure, or tightness. No edema, orthopnea, PND.   Discussed the use of AI scribe software for clinical note transcription with the patient, who gave verbal consent to proceed.  Today, she presents with a hx of atrial fibrillation and hypertension with concerns about blood pressure variability and medication management.  She reports marked home blood pressure variability, with some systolic readings over 200 mmHg. She initially used a wrist cuff and then switched to an arm cuff due to accuracy concerns, but readings remain elevated. Because of these high readings and difficulty contacting the office, she stopped her blood pressure medications and waited for this appointment.  She had been taking Cardizem  360 mg and Losartan  100 mg at bedtime, then moved both to the morning due to high readings, without benefit. Losartan  was increased from 50 mg to 100 mg in September 2025 but has not improved her blood pressure.  Her atrial fibrillation episodes have improved, with the last suspected episode on December 27, 2023. She has a Youth Worker device in place.  She has right ankle swelling after a recent fall.  For cholesterol, she takes Crestor  and Zetia . She increased Crestor  from 5 mg three times weekly to daily in September 2025. Her October 2025 lipid panel showed an LDL of 80 mg/dL.  She recalls a prior carotid ultrasound that showed fibromuscular dysplasia and a tortuous  internal carotid artery.  Reports no shortness of breath nor dyspnea on exertion. Reports no chest pain, pressure, or tightness. No edema, orthopnea, PND. Reports no  palpitations.   Discussed the use of AI scribe software for clinical note transcription with the patient, who gave verbal consent to proceed.   ROS: Pertinent ROS in HPI  Studies Reviewed      Carotid artery duplex 08/07/2020: Duplex suggests stenosis in the right internal carotid artery (1-15%). Duplex suggests stenosis in the left internal carotid artery (1-15%). There is very mild heterogenous plaque in bilateral carotid arteries. Antegrade right vertebral artery flow. Antegrade left vertebral artery flow   Risk Assessment/Calculations  CHA2DS2-VASc Score = 3  This indicates a 3.2% annual risk of stroke. The patient's score is based upon: CHF History: 0 HTN History: 1 Diabetes History: 0 Stroke History: 0 Vascular Disease History: 0 Age Score: 1 Gender Score: 1   Physical Exam VS:  BP (!) 155/77 (BP Location: Left Arm, Patient Position: Sitting, Cuff Size: Normal)   Pulse 72   Ht 5' 1.5 (1.562 m)   Wt 123 lb (55.8 kg)   SpO2 98%   BMI 22.86 kg/m        Wt Readings from Last 3 Encounters:  02/02/24 123 lb (55.8 kg)  10/21/23 125 lb (56.7 kg)  10/20/23 125 lb (56.7 kg)    GEN: Well nourished, well developed in no acute distress NECK: No JVD; No carotid bruits CARDIAC: RRR, no murmurs, rubs, gallops RESPIRATORY:  Clear to auscultation without rales, wheezing or rhonchi  ABDOMEN: Soft, non-tender, non-distended EXTREMITIES:  No edema; No deformity   ASSESSMENT AND PLAN   Essential hypertension Blood pressure remains elevated over 200 mmHg despite current regimen. Losartan  may have lost efficacy. Consider renal artery stenosis due to variable control. - Discontinued losartan . - Initiated valsartan  160 mg daily. - Recommend new BP cuff for accuracy  - Ordered renal artery ultrasound to assess for stenosis. - Monitor blood pressure with readings before and 1.5 hours after medication.  Paroxysmal atrial fibrillation No episodes since  mid-December. -continue current medication regimen  Mixed hyperlipidemia LDL was 80 mg/dL in October. Crestor  dosage increased to daily since September. -Taking Zetia  10 mg - Repeat lipid panel during next lab work in March.  Arterial fibromuscular dysplasia Diagnosis of fibromuscular dysplasia with tortuous internal carotid artery. Potential renal artery involvement considered. - Ordered renal artery ultrasound to assess for stenosis.    Dispo: She can follow-up in 3-4 year with Dr. [place]  Signed, Orren LOISE Fabry, PA-C   "

## 2024-02-02 ENCOUNTER — Encounter: Payer: Self-pay | Admitting: Physician Assistant

## 2024-02-02 ENCOUNTER — Ambulatory Visit: Attending: Physician Assistant | Admitting: Physician Assistant

## 2024-02-02 ENCOUNTER — Other Ambulatory Visit: Payer: Self-pay | Admitting: Family Medicine

## 2024-02-02 VITALS — BP 155/77 | HR 72 | Ht 61.5 in | Wt 123.0 lb

## 2024-02-02 DIAGNOSIS — I773 Arterial fibromuscular dysplasia: Secondary | ICD-10-CM

## 2024-02-02 DIAGNOSIS — E782 Mixed hyperlipidemia: Secondary | ICD-10-CM | POA: Diagnosis not present

## 2024-02-02 DIAGNOSIS — I10 Essential (primary) hypertension: Secondary | ICD-10-CM | POA: Diagnosis not present

## 2024-02-02 DIAGNOSIS — I480 Paroxysmal atrial fibrillation: Secondary | ICD-10-CM

## 2024-02-02 MED ORDER — VALSARTAN 160 MG PO TABS: 160.0000 mg | ORAL_TABLET | Freq: Every day | ORAL | 1 refills | Status: DC

## 2024-02-02 NOTE — Patient Instructions (Signed)
 Medication Instructions:  STOP Losartan  (cozaar )  START Valsartan  (diovan ) Spread your blood pressure medication out take half medications in the morning and the other half in the evening  PLEASE STOP BY THE PHARMACY ON THE FIRST FLOOR AND PURCHASE A NEW BLOOD PRESSURE CUFF. *If you need a refill on your cardiac medications before your next appointment, please call your pharmacy*  Lab Work: COMPLETE A LIPID PANEL WITH YOUR PCP AND SEND OUR OFFICE A COPY OF RESULTS  If you have labs (blood work) drawn today and your tests are completely normal, you will receive your results only by: MyChart Message (if you have MyChart) OR A paper copy in the mail If you have any lab test that is abnormal or we need to change your treatment, we will call you to review the results.  Testing/Procedures: Your physician has requested that you have a renal artery duplex. During this test, an ultrasound is used to evaluate blood flow to the kidneys. Allow one hour for this exam. Do not eat after midnight the day before and avoid carbonated beverages. Take your medications as you usually do.  Follow-Up: At [facility], you and your health needs are our priority.  As part of our continuing mission to provide you with exceptional heart care, our providers are all part of one team.  This team includes your primary Cardiologist (physician) and Advanced Practice Providers or APPs (Physician Assistants and Nurse Practitioners) who all work together to provide you with the care you need, when you need it.  Your next appointment:   3-4 month(s)  Provider:   Newman JINNY Lawrence, MD or Orren Fabry, PA   We recommend signing up for the patient portal called MyChart.  Sign up information is provided on this After Visit Summary.  MyChart is used to connect with patients for Virtual Visits (Telemedicine).  Patients are able to view lab/test results, encounter notes, upcoming appointments, etc.  Non-urgent messages  can be sent to your provider as well.   To learn more about what you can do with MyChart, go to [email or web address].   Other Instructions Please check your blood pressure daily for two weeks, then contact the office with your readings  Please contact the office with your readings either by phone, by dropping it off in person, or by sending it through MyChart.   Be sure to check your blood pressure one to two hours after taking your medications.  Avoid the following for 30 minutes before checking your blood pressure: No caffeine  No alcohol No eating No smoking  No exercise  Five minutes before checking your blood pressure: Use the restroom Sit up straight in a chair with your back supported and feet flat on the floor Remain quiet and do not talk

## 2024-02-06 ENCOUNTER — Telehealth: Payer: Self-pay

## 2024-02-06 NOTE — Telephone Encounter (Signed)
 Copied from CRM #[id number]. Topic: Clinical - Request for Lab/Test Order >> Feb 06, 2024  1:52 PM Kristy Arias wrote: Reason for CRM: CPE labs and cardiologist recommend lipid panel, please advise [phone].

## 2024-02-10 ENCOUNTER — Ambulatory Visit (HOSPITAL_COMMUNITY)
Admission: RE | Admit: 2024-02-10 | Discharge: 2024-02-10 | Disposition: A | Discharge status: Home / Self Care | Source: Ambulatory Visit | Attending: Physician Assistant | Admitting: Physician Assistant

## 2024-02-10 ENCOUNTER — Telehealth: Payer: Self-pay | Admitting: Physician Assistant

## 2024-02-10 DIAGNOSIS — I773 Arterial fibromuscular dysplasia: Secondary | ICD-10-CM | POA: Insufficient documentation

## 2024-02-10 DIAGNOSIS — E782 Mixed hyperlipidemia: Secondary | ICD-10-CM | POA: Diagnosis present

## 2024-02-10 DIAGNOSIS — I10 Essential (primary) hypertension: Secondary | ICD-10-CM | POA: Insufficient documentation

## 2024-02-10 DIAGNOSIS — I480 Paroxysmal atrial fibrillation: Secondary | ICD-10-CM | POA: Diagnosis present

## 2024-02-10 NOTE — Telephone Encounter (Signed)
 Patient walked in and would like you to know that she has been on the new BP meds for about a week and stated her average BP readings are 175/75 (with some slightly higher).  Patient has new BP cuff.  Would like a moment to discuss - Please reach out to patient.  I scheduled her follow up with you on 04/05/2024.

## 2024-02-13 ENCOUNTER — Ambulatory Visit: Payer: Self-pay | Admitting: Physician Assistant

## 2024-02-13 NOTE — Progress Notes (Signed)
 Renal artery stenosis is moderate.  If blood pressure is well-controlled, does not need any further intervention.  Celiac artery stenosis is incidental finding.  Unless she is having any postprandial abdominal pain, does not need any further intervention.  Continue medical therapy.  Thanks MJP

## 2024-02-16 MED ORDER — VALSARTAN 320 MG PO TABS: 320.0000 mg | ORAL_TABLET | Freq: Every day | ORAL | 1 refills | Status: AC

## 2024-02-16 NOTE — Telephone Encounter (Signed)
 Left a message for the patient informing her that we have increased her blood pressure medication and for her to contact the office with any questions or concerns.   Rx(s) sent to pharmacy electronically.

## 2024-04-05 ENCOUNTER — Ambulatory Visit: Admitting: Physician Assistant

## 2024-04-09 ENCOUNTER — Encounter: Admitting: Family Medicine

## 2024-05-14 IMAGING — DX DG CHEST 1V PORT
1 series · 1 of 1 positions shown · non-contrast
Comparison: 12/29/2018

CLINICAL DATA: Cardiac arrhythmia, tachycardia

EXAM:
PORTABLE CHEST 1 VIEW

[chest ap]
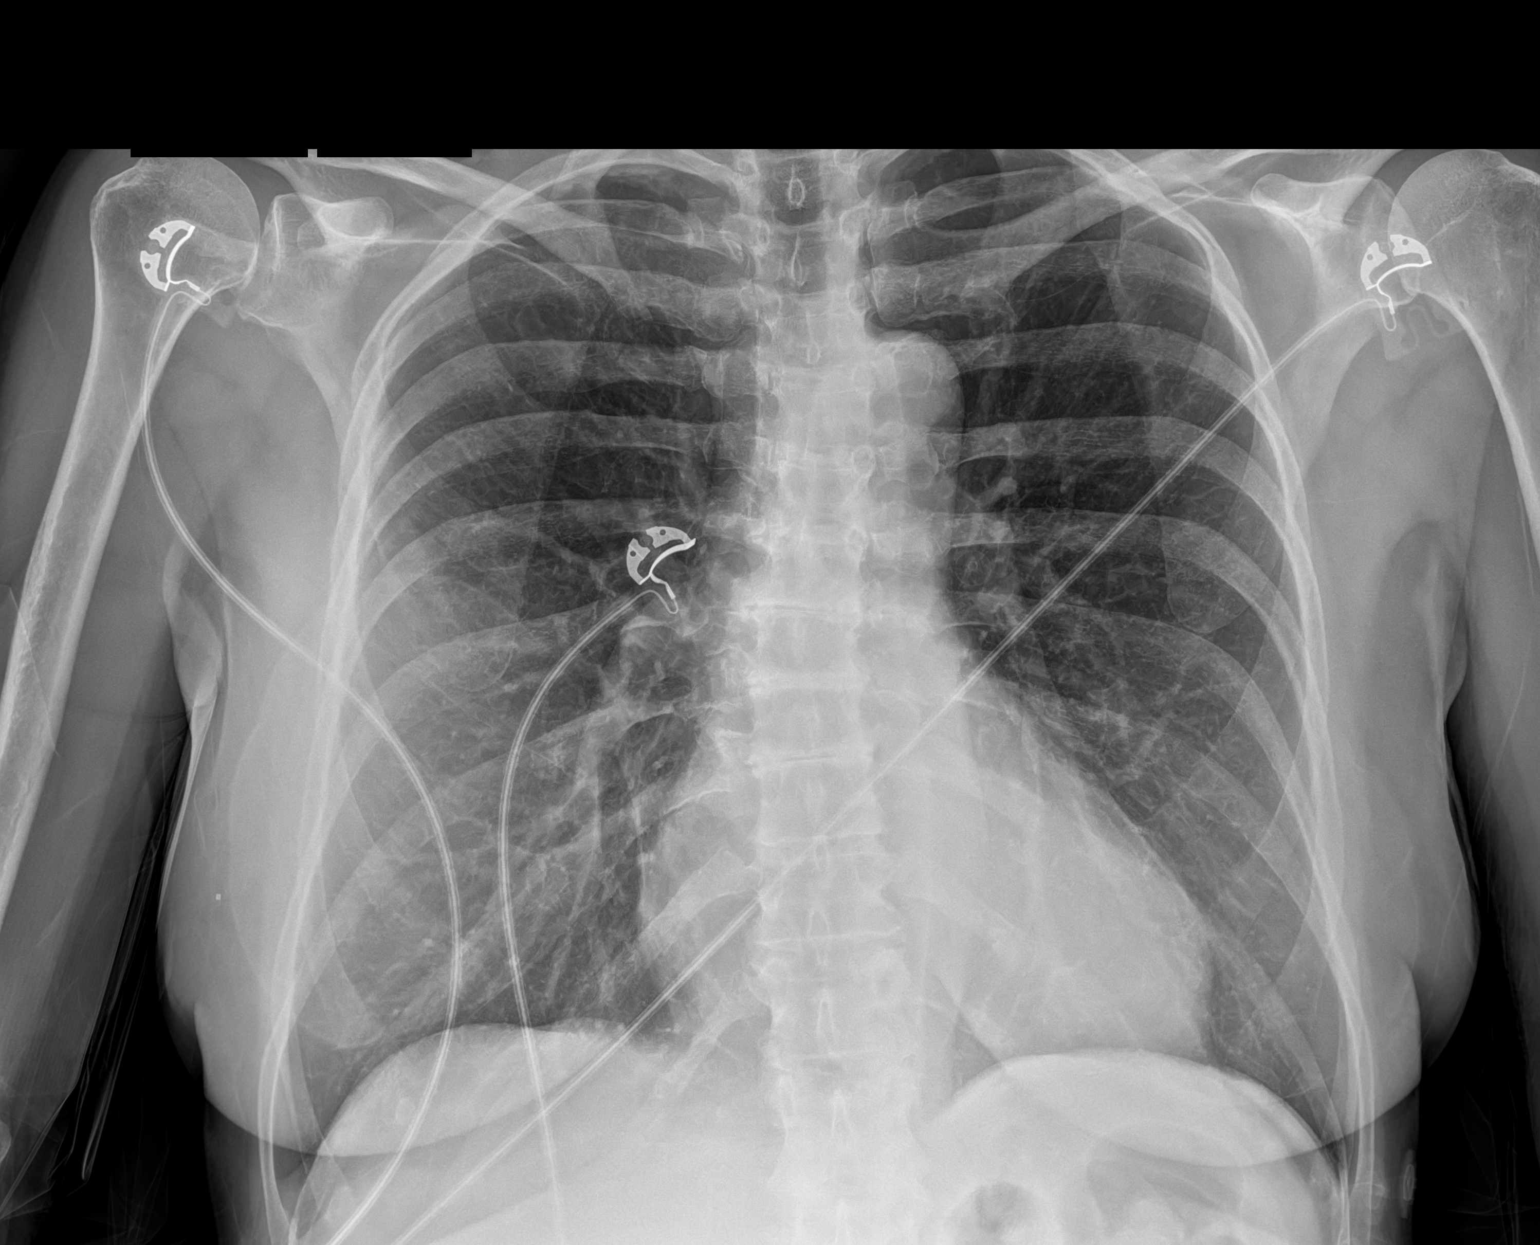

[1 of 1 positions shown; findings below may reference images not displayed]

FINDINGS: The heart size and mediastinal contours are within normal limits.
Both lungs are clear. The visualized skeletal structures are
unremarkable.
IMPRESSION: No active disease.
